# Patient Record
Sex: Female | Born: 1945
Health system: Southern US, Community
[De-identification: ages and names within clinical notes are randomized; demographics above are authoritative.]

## PROBLEM LIST (undated history)

## (undated) DIAGNOSIS — IMO0001 Reserved for inherently not codable concepts without codable children: Secondary | ICD-10-CM

## (undated) DIAGNOSIS — G5600 Carpal tunnel syndrome, unspecified upper limb: Secondary | ICD-10-CM

## (undated) DIAGNOSIS — M1990 Unspecified osteoarthritis, unspecified site: Secondary | ICD-10-CM

## (undated) DIAGNOSIS — I10 Essential (primary) hypertension: Secondary | ICD-10-CM

## (undated) DIAGNOSIS — Z5189 Encounter for other specified aftercare: Secondary | ICD-10-CM

## (undated) DIAGNOSIS — D649 Anemia, unspecified: Secondary | ICD-10-CM

## (undated) HISTORY — PX: KNEE ARTHROPLASTY: SHX992

## (undated) HISTORY — PX: APPENDECTOMY: SHX54

## (undated) HISTORY — PX: ABDOMINAL HYSTERECTOMY: SHX81

---

## 2004-01-25 ENCOUNTER — Emergency Department: Payer: Self-pay | Admitting: Internal Medicine

## 2004-04-30 ENCOUNTER — Emergency Department: Payer: Self-pay | Admitting: Emergency Medicine

## 2004-06-23 ENCOUNTER — Emergency Department: Payer: Self-pay | Admitting: Internal Medicine

## 2004-06-24 ENCOUNTER — Ambulatory Visit: Payer: Self-pay | Admitting: Internal Medicine

## 2004-07-07 ENCOUNTER — Ambulatory Visit: Payer: Self-pay | Admitting: Internal Medicine

## 2004-08-30 ENCOUNTER — Ambulatory Visit: Payer: Self-pay | Admitting: Internal Medicine

## 2004-12-01 ENCOUNTER — Emergency Department: Payer: Self-pay | Admitting: Emergency Medicine

## 2005-02-02 ENCOUNTER — Emergency Department: Payer: Self-pay | Admitting: Emergency Medicine

## 2005-03-25 ENCOUNTER — Other Ambulatory Visit: Payer: Self-pay

## 2005-03-25 ENCOUNTER — Inpatient Hospital Stay: Payer: Self-pay | Admitting: Internal Medicine

## 2005-04-01 ENCOUNTER — Ambulatory Visit: Payer: Self-pay | Admitting: Internal Medicine

## 2005-06-23 ENCOUNTER — Inpatient Hospital Stay: Payer: Self-pay | Admitting: Specialist

## 2007-06-06 ENCOUNTER — Emergency Department: Payer: Self-pay | Admitting: Emergency Medicine

## 2009-07-25 ENCOUNTER — Emergency Department: Payer: Self-pay | Admitting: Emergency Medicine

## 2010-08-25 ENCOUNTER — Emergency Department: Payer: Self-pay | Admitting: Emergency Medicine

## 2011-04-17 ENCOUNTER — Encounter (HOSPITAL_COMMUNITY): Payer: Self-pay | Admitting: Respiratory Therapy

## 2011-04-22 ENCOUNTER — Encounter (HOSPITAL_COMMUNITY)
Admission: RE | Admit: 2011-04-22 | Discharge: 2011-04-22 | Disposition: A | Discharge status: Home / Self Care | Payer: Medicare Other | Source: Ambulatory Visit | Attending: Neurosurgery | Admitting: Neurosurgery

## 2011-04-22 ENCOUNTER — Encounter (HOSPITAL_COMMUNITY)
Admission: RE | Admit: 2011-04-22 | Discharge: 2011-04-22 | Disposition: A | Discharge status: Home / Self Care | Payer: Medicare Other | Source: Ambulatory Visit | Attending: Anesthesiology | Admitting: Anesthesiology

## 2011-04-22 ENCOUNTER — Other Ambulatory Visit: Payer: Self-pay

## 2011-04-22 ENCOUNTER — Encounter (HOSPITAL_COMMUNITY): Payer: Self-pay

## 2011-04-22 HISTORY — DX: Encounter for other specified aftercare: Z51.89

## 2011-04-22 HISTORY — DX: Essential (primary) hypertension: I10

## 2011-04-22 HISTORY — DX: Anemia, unspecified: D64.9

## 2011-04-22 HISTORY — DX: Carpal tunnel syndrome, unspecified upper limb: G56.00

## 2011-04-22 HISTORY — DX: Unspecified osteoarthritis, unspecified site: M19.90

## 2011-04-22 HISTORY — DX: Reserved for inherently not codable concepts without codable children: IMO0001

## 2011-04-22 LAB — BASIC METABOLIC PANEL
Calcium: 9.4 mg/dL (ref 8.4–10.5)
Creatinine, Ser: 0.79 mg/dL (ref 0.50–1.10)
GFR calc Af Amer: >90 mL/min (ref >90)

## 2011-04-22 LAB — SURGICAL PCR SCREEN
MRSA, PCR: NEGATIVE
Staphylococcus aureus: POSITIVE — AB

## 2011-04-22 LAB — CBC
Platelets: 228 10*3/uL (ref 150–400)
RDW: 13 % (ref 11.5–15.5)
WBC: 8.2 10*3/uL (ref 4.0–10.5)

## 2011-04-22 NOTE — Pre-Procedure Instructions (Signed)
[address] [name]  04/22/2011   Your procedure is scheduled on:  Monday April 27, 2011  Report to [facility] at 1:00PM.  Call this number if you have problems the morning of surgery: [phone]   Remember:   Do not eat food:After Midnight.  May have clear liquids: up to 4 Hours before arrival. (up to 9:00am)  Clear liquids include soda, tea, black coffee, apple or grape juice, broth.  Take these medicines the morning of surgery with A SIP OF WATER: flexeril, metoprolol, prednisone   Do not wear jewelry, make-up or nail polish.  Do not wear lotions, powders, or perfumes. You may wear deodorant.  Do not shave 48 hours prior to surgery.  Do not bring valuables to the hospital.  Contacts, dentures or bridgework may not be worn into surgery.  Leave suitcase in the car. After surgery it may be brought to your room.  For patients admitted to the hospital, checkout time is 11:00 AM the day of discharge.   Patients discharged the day of surgery will not be allowed to drive home.  Name and phone number of your driver: Donta Mcinroy [phone]  Special Instructions: CHG Shower Use Special Wash: 1/2 bottle night before surgery and 1/2 bottle morning of surgery.   Please read over the following fact sheets that you were given: Pain Booklet, Coughing and Deep Breathing, MRSA Information and Surgical Site Infection Prevention

## 2011-04-22 NOTE — Progress Notes (Signed)
Forwarded to [facility] for Elevated blood sugar and  EKG eval.

## 2011-04-23 NOTE — Consult Note (Signed)
Anesthesia:  Patient is a 66 year old female scheduled for a C4-5 ACDF on 04/27/11.  History includes obesity, HTN, DM2, anemia with history of transfusion, non-smoker, arthritis, carpal tunnel syndrome.    Labs noted.  She will be a CBG on arrival.   CXR shows no acute cardiopulmonary process.  EKG shows NSR, LAD, moderate voltage criteria for LVH (may be normal variant).  No CV symptoms reported at PAT.  If she remains asymptomatic then anticipate she can proceed.

## 2011-04-26 MED ORDER — VANCOMYCIN HCL 500 MG IV SOLR
500.0000 mg | Freq: Once | INTRAVENOUS | Status: AC
  Administered 2011-04-27: 500 mg via INTRAVENOUS
  Filled 2011-04-26: qty 500

## 2011-04-27 ENCOUNTER — Encounter (HOSPITAL_COMMUNITY): Payer: Self-pay | Admitting: Vascular Surgery

## 2011-04-27 ENCOUNTER — Inpatient Hospital Stay (HOSPITAL_COMMUNITY): Payer: Medicare Other | Admitting: Vascular Surgery

## 2011-04-27 ENCOUNTER — Inpatient Hospital Stay (HOSPITAL_COMMUNITY)
Admission: RE | Admit: 2011-04-27 | Discharge: 2011-04-28 | DRG: 472 | Disposition: A | Discharge status: Home / Self Care | Payer: Medicare Other | Source: Ambulatory Visit | Attending: Neurosurgery | Admitting: Neurosurgery

## 2011-04-27 ENCOUNTER — Encounter (HOSPITAL_COMMUNITY): Payer: Self-pay | Admitting: Surgery

## 2011-04-27 ENCOUNTER — Encounter (HOSPITAL_COMMUNITY)
Admission: RE | Disposition: A | Discharge status: Home / Self Care | Payer: Self-pay | Source: Ambulatory Visit | Attending: Neurosurgery

## 2011-04-27 ENCOUNTER — Inpatient Hospital Stay (HOSPITAL_COMMUNITY): Payer: Medicare Other

## 2011-04-27 DIAGNOSIS — M4712 Other spondylosis with myelopathy, cervical region: Secondary | ICD-10-CM | POA: Diagnosis present

## 2011-04-27 DIAGNOSIS — E119 Type 2 diabetes mellitus without complications: Secondary | ICD-10-CM | POA: Diagnosis present

## 2011-04-27 DIAGNOSIS — Z01812 Encounter for preprocedural laboratory examination: Secondary | ICD-10-CM

## 2011-04-27 DIAGNOSIS — M4310 Spondylolisthesis, site unspecified: Secondary | ICD-10-CM | POA: Diagnosis present

## 2011-04-27 DIAGNOSIS — G959 Disease of spinal cord, unspecified: Secondary | ICD-10-CM | POA: Diagnosis present

## 2011-04-27 DIAGNOSIS — M5000 Cervical disc disorder with myelopathy, unspecified cervical region: Principal | ICD-10-CM | POA: Diagnosis present

## 2011-04-27 HISTORY — PX: ANTERIOR CERVICAL DECOMP/DISCECTOMY FUSION: SHX1161

## 2011-04-27 LAB — GLUCOSE, CAPILLARY
Glucose-Capillary: 206 mg/dL — ABNORMAL HIGH (ref 70–99)
Glucose-Capillary: 261 mg/dL — ABNORMAL HIGH (ref 70–99)
Glucose-Capillary: 285 mg/dL — ABNORMAL HIGH (ref 70–99)

## 2011-04-27 SURGERY — ANTERIOR CERVICAL DECOMPRESSION/DISCECTOMY FUSION 2 LEVELS
Anesthesia: General | Wound class: Clean

## 2011-04-27 MED ORDER — GLYCOPYRROLATE 0.2 MG/ML IJ SOLN
INTRAMUSCULAR | Status: DC | PRN
  Administered 2011-04-27: .8 mg via INTRAVENOUS

## 2011-04-27 MED ORDER — PHENOL 1.4 % MT LIQD
1.0000 | OROMUCOSAL | Status: DC | PRN
  Administered 2011-04-27: 1 via OROMUCOSAL
  Filled 2011-04-27: qty 177

## 2011-04-27 MED ORDER — CYCLOBENZAPRINE HCL 10 MG PO TABS: 10.0000 mg | ORAL_TABLET | Freq: Three times a day (TID) | ORAL | Status: DC | PRN

## 2011-04-27 MED ORDER — CELECOXIB 200 MG PO CAPS
200.0000 mg | ORAL_CAPSULE | Freq: Every day | ORAL | Status: DC
  Administered 2011-04-27 – 2011-04-28 (×2): 200 mg via ORAL
  Filled 2011-04-27 (×2): qty 1

## 2011-04-27 MED ORDER — ACETAMINOPHEN 325 MG PO TABS: 650.0000 mg | ORAL_TABLET | ORAL | Status: DC | PRN

## 2011-04-27 MED ORDER — HYDROMORPHONE HCL PF 1 MG/ML IJ SOLN
0.2500 mg | INTRAMUSCULAR | Status: DC | PRN
  Administered 2011-04-27 (×2): 0.5 mg via INTRAVENOUS

## 2011-04-27 MED ORDER — NEOSTIGMINE METHYLSULFATE 1 MG/ML IJ SOLN
INTRAMUSCULAR | Status: DC | PRN
  Administered 2011-04-27: 5 mg via INTRAVENOUS

## 2011-04-27 MED ORDER — HYDROCHLOROTHIAZIDE 25 MG PO TABS
25.0000 mg | ORAL_TABLET | Freq: Every day | ORAL | Status: DC
  Administered 2011-04-27 – 2011-04-28 (×2): 25 mg via ORAL
  Filled 2011-04-27 (×2): qty 1

## 2011-04-27 MED ORDER — INSULIN ASPART 100 UNIT/ML ~~LOC~~ SOLN
0.0000 [IU] | Freq: Every day | SUBCUTANEOUS | Status: DC
  Administered 2011-04-27: 3 [IU] via SUBCUTANEOUS

## 2011-04-27 MED ORDER — 0.9 % SODIUM CHLORIDE (POUR BTL) OPTIME
TOPICAL | Status: DC | PRN
  Administered 2011-04-27: 1000 mL

## 2011-04-27 MED ORDER — ONDANSETRON HCL 4 MG/2ML IJ SOLN: 4.0000 mg | INTRAMUSCULAR | Status: DC | PRN

## 2011-04-27 MED ORDER — CEFAZOLIN SODIUM 1-5 GM-% IV SOLN: 1.0000 g | Freq: Three times a day (TID) | INTRAVENOUS | Status: DC

## 2011-04-27 MED ORDER — THROMBIN 5000 UNITS EX SOLR
CUTANEOUS | Status: DC | PRN
  Administered 2011-04-27: 5000 [IU] via TOPICAL

## 2011-04-27 MED ORDER — NAPROXEN 375 MG PO TABS
375.0000 mg | ORAL_TABLET | Freq: Three times a day (TID) | ORAL | Status: DC
  Administered 2011-04-27 – 2011-04-28 (×2): 375 mg via ORAL
  Filled 2011-04-27 (×5): qty 1

## 2011-04-27 MED ORDER — INSULIN ASPART 100 UNIT/ML ~~LOC~~ SOLN
0.0000 [IU] | Freq: Three times a day (TID) | SUBCUTANEOUS | Status: DC
  Administered 2011-04-27: 11 [IU] via SUBCUTANEOUS
  Administered 2011-04-28: 7 [IU] via SUBCUTANEOUS

## 2011-04-27 MED ORDER — ROCURONIUM BROMIDE 100 MG/10ML IV SOLN
INTRAVENOUS | Status: DC | PRN
  Administered 2011-04-27: 20 mg via INTRAVENOUS
  Administered 2011-04-27: 10 mg via INTRAVENOUS
  Administered 2011-04-27: 50 mg via INTRAVENOUS

## 2011-04-27 MED ORDER — VANCOMYCIN HCL IN DEXTROSE 1-5 GM/200ML-% IV SOLN
1000.0000 mg | Freq: Once | INTRAVENOUS | Status: AC
  Administered 2011-04-28: 1000 mg via INTRAVENOUS
  Filled 2011-04-27: qty 200

## 2011-04-27 MED ORDER — CLONIDINE HCL 0.1 MG PO TABS
0.1000 mg | ORAL_TABLET | Freq: Three times a day (TID) | ORAL | Status: DC
  Administered 2011-04-27 – 2011-04-28 (×3): 0.1 mg via ORAL
  Filled 2011-04-27 (×5): qty 1

## 2011-04-27 MED ORDER — LABETALOL HCL 5 MG/ML IV SOLN
INTRAVENOUS | Status: AC
  Administered 2011-04-27: 5 mg
  Filled 2011-04-27: qty 4

## 2011-04-27 MED ORDER — PANTOPRAZOLE SODIUM 40 MG PO TBEC
40.0000 mg | DELAYED_RELEASE_TABLET | Freq: Every day | ORAL | Status: DC
  Administered 2011-04-27: 40 mg via ORAL
  Filled 2011-04-27: qty 1

## 2011-04-27 MED ORDER — LISINOPRIL 20 MG PO TABS
20.0000 mg | ORAL_TABLET | Freq: Every day | ORAL | Status: DC
  Administered 2011-04-27 – 2011-04-28 (×2): 20 mg via ORAL
  Filled 2011-04-27 (×2): qty 1

## 2011-04-27 MED ORDER — FENTANYL CITRATE 0.05 MG/ML IJ SOLN
INTRAMUSCULAR | Status: DC | PRN
  Administered 2011-04-27: 50 ug via INTRAVENOUS
  Administered 2011-04-27: 100 ug via INTRAVENOUS
  Administered 2011-04-27 (×2): 50 ug via INTRAVENOUS
  Administered 2011-04-27: 100 ug via INTRAVENOUS

## 2011-04-27 MED ORDER — SODIUM CHLORIDE 0.9 % IR SOLN
Status: DC | PRN
  Administered 2011-04-27: 13:00:00

## 2011-04-27 MED ORDER — BACITRACIN 50000 UNITS IM SOLR
INTRAMUSCULAR | Status: AC
Start: 2011-04-27 — End: 2011-04-28
  Filled 2011-04-27: qty 1

## 2011-04-27 MED ORDER — HYDROMORPHONE HCL PF 1 MG/ML IJ SOLN: 0.5000 mg | INTRAMUSCULAR | Status: DC | PRN

## 2011-04-27 MED ORDER — METOPROLOL SUCCINATE ER 25 MG PO TB24
25.0000 mg | ORAL_TABLET | Freq: Every day | ORAL | Status: DC
  Administered 2011-04-28: 25 mg via ORAL
  Filled 2011-04-27: qty 1

## 2011-04-27 MED ORDER — LINAGLIPTIN 5 MG PO TABS
5.0000 mg | ORAL_TABLET | Freq: Every day | ORAL | Status: DC
  Administered 2011-04-28: 5 mg via ORAL
  Filled 2011-04-27 (×2): qty 1

## 2011-04-27 MED ORDER — OXYCODONE-ACETAMINOPHEN 5-325 MG PO TABS
1.0000 | ORAL_TABLET | ORAL | Status: DC | PRN
  Administered 2011-04-27: 1 via ORAL
  Filled 2011-04-27: qty 1

## 2011-04-27 MED ORDER — HYDROMORPHONE HCL PF 1 MG/ML IJ SOLN
INTRAMUSCULAR | Status: AC
  Filled 2011-04-27: qty 1

## 2011-04-27 MED ORDER — HEMOSTATIC AGENTS (NO CHARGE) OPTIME
TOPICAL | Status: DC | PRN
  Administered 2011-04-27: 1 via TOPICAL

## 2011-04-27 MED ORDER — ACETAMINOPHEN 650 MG RE SUPP: 650.0000 mg | RECTAL | Status: DC | PRN

## 2011-04-27 MED ORDER — LACTATED RINGERS IV SOLN
INTRAVENOUS | Status: DC | PRN
  Administered 2011-04-27 (×2): via INTRAVENOUS

## 2011-04-27 MED ORDER — LABETALOL HCL 5 MG/ML IV SOLN
5.0000 mg | INTRAVENOUS | Status: DC | PRN
  Administered 2011-04-27 (×2): 5 mg via INTRAVENOUS

## 2011-04-27 MED ORDER — ONDANSETRON HCL 4 MG/2ML IJ SOLN: 4.0000 mg | Freq: Once | INTRAMUSCULAR | Status: DC | PRN

## 2011-04-27 MED ORDER — SODIUM CHLORIDE 0.9 % IJ SOLN: 3.0000 mL | Freq: Two times a day (BID) | INTRAMUSCULAR | Status: DC

## 2011-04-27 MED ORDER — MENTHOL 3 MG MT LOZG: 1.0000 | LOZENGE | OROMUCOSAL | Status: DC | PRN

## 2011-04-27 MED ORDER — THROMBIN 5000 UNITS EX SOLR
OROMUCOSAL | Status: DC | PRN
  Administered 2011-04-27: 14:00:00 via TOPICAL

## 2011-04-27 MED ORDER — PANTOPRAZOLE SODIUM 40 MG IV SOLR
40.0000 mg | Freq: Every day | INTRAVENOUS | Status: DC
  Filled 2011-04-27: qty 40

## 2011-04-27 MED ORDER — LIDOCAINE HCL 4 % MT SOLN
OROMUCOSAL | Status: DC | PRN
  Administered 2011-04-27: 4 mL via TOPICAL

## 2011-04-27 MED ORDER — LABETALOL HCL 5 MG/ML IV SOLN
10.0000 mg | INTRAVENOUS | Status: DC | PRN
  Administered 2011-04-27: 10 mg via INTRAVENOUS
  Filled 2011-04-27: qty 4

## 2011-04-27 MED ORDER — PREDNISONE 10 MG PO TABS: 10.0000 mg | ORAL_TABLET | Freq: Every day | ORAL | Status: DC

## 2011-04-27 MED ORDER — DEXAMETHASONE SODIUM PHOSPHATE 10 MG/ML IJ SOLN
INTRAMUSCULAR | Status: DC | PRN
  Administered 2011-04-27: 8 mg via INTRAVENOUS

## 2011-04-27 MED ORDER — PROPOFOL 10 MG/ML IV EMUL
INTRAVENOUS | Status: DC | PRN
  Administered 2011-04-27: 200 mg via INTRAVENOUS

## 2011-04-27 MED ORDER — SODIUM CHLORIDE 0.9 % IV SOLN
INTRAVENOUS | Status: AC
  Filled 2011-04-27: qty 500

## 2011-04-27 MED ORDER — LIDOCAINE HCL (CARDIAC) 20 MG/ML IV SOLN
INTRAVENOUS | Status: DC | PRN
  Administered 2011-04-27: 40 mg via INTRAVENOUS

## 2011-04-27 MED ORDER — ONDANSETRON HCL 4 MG/2ML IJ SOLN
INTRAMUSCULAR | Status: DC | PRN
  Administered 2011-04-27: 4 mg via INTRAVENOUS

## 2011-04-27 MED ORDER — METFORMIN HCL 500 MG PO TABS
1000.0000 mg | ORAL_TABLET | Freq: Two times a day (BID) | ORAL | Status: DC
  Administered 2011-04-27 – 2011-04-28 (×2): 1000 mg via ORAL
  Filled 2011-04-27 (×4): qty 2

## 2011-04-27 MED ORDER — GLIPIZIDE ER 5 MG PO TB24
5.0000 mg | ORAL_TABLET | Freq: Two times a day (BID) | ORAL | Status: DC
  Administered 2011-04-27 – 2011-04-28 (×2): 5 mg via ORAL
  Filled 2011-04-27 (×4): qty 1

## 2011-04-27 MED ORDER — LISINOPRIL-HYDROCHLOROTHIAZIDE 20-25 MG PO TABS: 1.0000 | ORAL_TABLET | Freq: Every day | ORAL | Status: DC

## 2011-04-27 SURGICAL SUPPLY — 64 items
BAG DECANTER FOR FLEXI CONT (MISCELLANEOUS) ×2 IMPLANT
BENZOIN TINCTURE PRP APPL 2/3 (GAUZE/BANDAGES/DRESSINGS) ×2 IMPLANT
BIT DRILL SPINE QC 12 (BIT) ×2 IMPLANT
BRUSH SCRUB EZ PLAIN DRY (MISCELLANEOUS) ×2 IMPLANT
BUR MATCHSTICK NEURO 3.0 LAGG (BURR) ×2 IMPLANT
CANISTER SUCTION 2500CC (MISCELLANEOUS) ×2 IMPLANT
CLOTH BEACON ORANGE TIMEOUT ST (SAFETY) ×2 IMPLANT
CONT SPEC 4OZ CLIKSEAL STRL BL (MISCELLANEOUS) ×2 IMPLANT
CORDS BIPOLAR (ELECTRODE) ×2 IMPLANT
DERMABOND ADVANCED (GAUZE/BANDAGES/DRESSINGS) ×1
DERMABOND ADVANCED .7 DNX12 (GAUZE/BANDAGES/DRESSINGS) ×1 IMPLANT
DRAIN SNY WOU 7FLT (WOUND CARE) ×2 IMPLANT
DRAPE C-ARM 42X72 X-RAY (DRAPES) ×4 IMPLANT
DRAPE LAPAROTOMY 100X72 PEDS (DRAPES) ×2 IMPLANT
DRAPE MICROSCOPE ZEISS OPMI (DRAPES) ×2 IMPLANT
DRAPE POUCH INSTRU U-SHP 10X18 (DRAPES) ×2 IMPLANT
DRSG OPSITE 4X5.5 SM (GAUZE/BANDAGES/DRESSINGS) ×2 IMPLANT
ELECT COATED BLADE 2.86 ST (ELECTRODE) ×2 IMPLANT
ELECT REM PT RETURN 9FT ADLT (ELECTROSURGICAL) ×2
ELECTRODE REM PT RTRN 9FT ADLT (ELECTROSURGICAL) ×1 IMPLANT
EVACUATOR SILICONE 100CC (DRAIN) ×2 IMPLANT
GAUZE SPONGE 4X4 16PLY XRAY LF (GAUZE/BANDAGES/DRESSINGS) ×2 IMPLANT
GLOVE BIO SURGEON STRL SZ8 (GLOVE) ×2 IMPLANT
GLOVE BIOGEL PI IND STRL 7.0 (GLOVE) ×1 IMPLANT
GLOVE BIOGEL PI IND STRL 7.5 (GLOVE) ×2 IMPLANT
GLOVE BIOGEL PI INDICATOR 7.0 (GLOVE) ×1
GLOVE BIOGEL PI INDICATOR 7.5 (GLOVE) ×2
GLOVE ECLIPSE 7.5 STRL STRAW (GLOVE) ×8 IMPLANT
GLOVE EXAM NITRILE LRG STRL (GLOVE) IMPLANT
GLOVE EXAM NITRILE MD LF STRL (GLOVE) ×2 IMPLANT
GLOVE EXAM NITRILE XL STR (GLOVE) IMPLANT
GLOVE EXAM NITRILE XS STR PU (GLOVE) IMPLANT
GLOVE INDICATOR 8.5 STRL (GLOVE) ×2 IMPLANT
GLOVE SS BIOGEL STRL SZ 6.5 (GLOVE) ×2 IMPLANT
GLOVE SUPERSENSE BIOGEL SZ 6.5 (GLOVE) ×2
GOWN BRE IMP SLV AUR LG STRL (GOWN DISPOSABLE) IMPLANT
GOWN BRE IMP SLV AUR XL STRL (GOWN DISPOSABLE) ×6 IMPLANT
GOWN STRL REIN 2XL LVL4 (GOWN DISPOSABLE) ×2 IMPLANT
HEAD HALTER (SOFTGOODS) ×2 IMPLANT
HEMOSTAT POWDER KIT SURGIFOAM (HEMOSTASIS) ×2 IMPLANT
HEMOSTAT POWDER SURGIFOAM 1G (HEMOSTASIS) ×2 IMPLANT
KIT BASIN OR (CUSTOM PROCEDURE TRAY) ×2 IMPLANT
KIT ROOM TURNOVER OR (KITS) ×2 IMPLANT
NEEDLE SPNL 20GX3.5 QUINCKE YW (NEEDLE) ×2 IMPLANT
NS IRRIG 1000ML POUR BTL (IV SOLUTION) ×2 IMPLANT
PACK LAMINECTOMY NEURO (CUSTOM PROCEDURE TRAY) ×2 IMPLANT
PAD ARMBOARD 7.5X6 YLW CONV (MISCELLANEOUS) ×6 IMPLANT
PLATE ANT CERV XTEND 2 LV 32 (Plate) ×2 IMPLANT
PUTTY BONE DBX 2.5 MIS (Bone Implant) ×2 IMPLANT
RUBBERBAND STERILE (MISCELLANEOUS) ×4 IMPLANT
SCREW XTD VAR 4.2 SELF TAP 12 (Screw) ×12 IMPLANT
SPACER COLONIAL LGE 8MM 7DEG (Spacer) ×4 IMPLANT
SPONGE GAUZE 4X4 12PLY (GAUZE/BANDAGES/DRESSINGS) IMPLANT
SPONGE INTESTINAL PEANUT (DISPOSABLE) ×2 IMPLANT
SPONGE SURGIFOAM ABS GEL SZ50 (HEMOSTASIS) ×4 IMPLANT
STRIP CLOSURE SKIN 1/2X4 (GAUZE/BANDAGES/DRESSINGS) IMPLANT
SUT VIC AB 3-0 SH 8-18 (SUTURE) ×2 IMPLANT
SUT VICRYL 4-0 PS2 18IN ABS (SUTURE) ×4 IMPLANT
SYR 20ML ECCENTRIC (SYRINGE) ×2 IMPLANT
TAPE CLOTH 4X10 WHT NS (GAUZE/BANDAGES/DRESSINGS) ×2 IMPLANT
TOWEL OR 17X24 6PK STRL BLUE (TOWEL DISPOSABLE) ×2 IMPLANT
TOWEL OR 17X26 10 PK STRL BLUE (TOWEL DISPOSABLE) ×2 IMPLANT
TRAP SPECIMEN MUCOUS 40CC (MISCELLANEOUS) ×2 IMPLANT
WATER STERILE IRR 1000ML POUR (IV SOLUTION) ×2 IMPLANT

## 2011-04-27 NOTE — Transfer of Care (Signed)
Immediate Anesthesia Transfer of Care Note  Patient: [name]  Procedure(s) Performed: Procedure(s) (LRB): ANTERIOR CERVICAL DECOMPRESSION/DISCECTOMY FUSION 2 LEVELS (N/A)  Patient Location: PACU  Anesthesia Type: General  Level of Consciousness: awake, alert  and oriented  Airway & Oxygen Therapy: Patient Spontanous Breathing and Patient connected to nasal cannula oxygen  Post-op Assessment: Report given to PACU RN and Post -op Vital signs reviewed and stable  Post vital signs: Reviewed and stable  Complications: No apparent anesthesia complications

## 2011-04-27 NOTE — Anesthesia Procedure Notes (Signed)
Procedure Name: Intubation Date/Time: 04/27/2011 12:27 PM Performed by: Sharlene Dory E Pre-anesthesia Checklist: Patient identified, Emergency Drugs available, Suction available, Patient being monitored and Timeout performed Patient Re-evaluated:Patient Re-evaluated prior to inductionOxygen Delivery Method: Circle system utilized Preoxygenation: Pre-oxygenation with 100% oxygen Intubation Type: IV induction Ventilation: Mask ventilation without difficulty Laryngoscope Size: Miller and 2 Grade View: Grade II Tube type: Oral Tube size: 7.5 mm Number of attempts: 1 Airway Equipment and Method: Stylet Placement Confirmation: ETT inserted through vocal cords under direct vision,  positive ETCO2 and breath sounds checked- equal and bilateral Secured at: 21 cm Tube secured with: Tape Dental Injury: Teeth and Oropharynx as per pre-operative assessment  Comments: Intubation per Georgiann Mohs, SRNA

## 2011-04-27 NOTE — Op Note (Signed)
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  EBL: Minimal  History of present illness: Patient 65 year 3 months of progressive worsening neck pain bilateral arm pain with numbness tingling weakness in her hands. 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 Risks and benefits of the procedure, perioperative course, expectations of outcome, and alternatives surgery were all explained.  Procedure: Patient was brought in the or was induced under general anesthesia and positioned supine the neck in slight extension in 5 pounds of halter traction. The right-sided neck was prepped and draped in routine sterile fashion preoperative x-ray localize the appropriate level so a curvilinear incision was made just off the midline to the anterior border of the sternomastoid and superficial layer of the platysma was dissected out and divided longitudinally. The avascular 10-20 sternomastoid and and the strap muscles was developed down to the prevertebral fascia. Interoperative x-ray identified the appropriate level the longus close and  reflected laterally and self-retaining retractors placed. There is a large anterior aspect of the C3-4 disc space was opened with a Leksell rongeur. Then an annulotomy some a little 15 blade scalpel anterior ossified to remove the tooth and a Kerrison punch in both the space were drilled down the posterior annulus nausea complex first working at C3-4 this disc space was further drilled down the microscopic illumination aggressive undergoing of both endplates of identification PLL there was a subligamentous disc herniation and partially calcified this was teased off of the dura with a blunt nerve hook alleviate a lot of central stenosis or cord compression. Removal of this and aggressive undergoing of both endplates with removal of some osteophyte coming off the C3 and C4 vertebral body also improved the decompression the central canal. Carried out laterally to C4 pedicles were identified and C4 neuroforamina were decompressed. This was then packed with Gelfoam and attention was taken the C4-5 similar fashion C4-5 was drilled down again down the posterior annulus nausea complex aggressive and viable template help decompress the central canal the PLL was identified and removed in piecemeal fashion. March across laterally posted 5 pedicles identified. There was a decompressed flush with pedicle. Then both endplates of both especially with a prescription be a curet copiously irrigated meticulous hemostasis was maintained. 28 mm peek cages were packed with local autograft mixed with DBX and then the globus extend plating system was placed all screws excellent purchase locking mechanisms were engaged in the was inspected and copiously irrigated after meticulous in a stasis was maintained the carotid and soft his air were inspected For no injury wounds and closed after placement of a 7 mm a 7 mm JP drain and the platysmas reapproximated with interrupted Vicryl and skin was closed running 4 subcuticular  benzoin Steri-Strips  were applied patient recovered in stable condition at the end of case all needle counts and sponge counts were correct.

## 2011-04-27 NOTE — Anesthesia Postprocedure Evaluation (Signed)
  Anesthesia Post-op Note  Patient: [name]  Procedure(s) Performed: Procedure(s) (LRB): ANTERIOR CERVICAL DECOMPRESSION/DISCECTOMY FUSION 2 LEVELS (N/A)  Patient Location: PACU  Anesthesia Type: General  Level of Consciousness: awake, alert , oriented and patient cooperative  Airway and Oxygen Therapy: Patient Spontanous Breathing and Patient connected to nasal cannula oxygen  Post-op Pain: mild  Post-op Assessment: Post-op Vital signs reviewed, Patient's Cardiovascular Status Stable, Respiratory Function Stable, Patent Airway, No signs of Nausea or vomiting and Pain level controlled  Post-op Vital Signs: stable  Complications: No apparent anesthesia complications

## 2011-04-27 NOTE — Anesthesia Preprocedure Evaluation (Signed)
Anesthesia Evaluation  Patient identified by MRN, date of birth, ID band Patient awake    Reviewed: Allergy & Precautions, H&P , NPO status   Airway Mallampati: II TM Distance: >3 FB Neck ROM: full    Dental  (+) Teeth Intact   Pulmonary          Cardiovascular hypertension, Rhythm:regular Rate:Normal     Neuro/Psych  Neuromuscular disease    GI/Hepatic   Endo/Other  Diabetes mellitus-, Type 2, Oral Hypoglycemic AgentsMorbid obesity  Renal/GU      Musculoskeletal   Abdominal   Peds  Hematology   Anesthesia Other Findings   Reproductive/Obstetrics                           Anesthesia Physical Anesthesia Plan  ASA: III  Anesthesia Plan: General ETT   Post-op Pain Management:    Induction: Intravenous  Airway Management Planned: Oral ETT  Additional Equipment:   Intra-op Plan:   Post-operative Plan: Extubation in OR  Informed Consent: I have reviewed the patients History and Physical, chart, labs and discussed the procedure including the risks, benefits and alternatives for the proposed anesthesia with the patient or authorized representative who has indicated his/her understanding and acceptance.     Plan Discussed with: Anesthesiologist, CRNA and Surgeon  Anesthesia Plan Comments:         Anesthesia Quick Evaluation

## 2011-04-27 NOTE — Preoperative (Signed)
Beta Blockers   Reason not to administer Beta Blockers:Not Applicable 

## 2011-04-27 NOTE — H&P (Signed)
[name] is an 66 y.o. female.   Chief Complaint: Neck and bilateral arm pain and tingling her in HPI: Patient positive for shortness of breath worsening neck pain with numbness tingling weakness in her hands that she's noticed is been getting progressive worse over last several weeks and on each underwent imaging workup which showed significant disc herniation C3-4 with severe spinal cord compression and signal change within the cord at levels will spondylolisthesis C4-5 with bifrontal stenosis that level as well and due to patient's failed conservative treatment imaging findings progression of clinical syndrome and clinical exam patient was recommended anterior cervical discectomy fusion with a varus benefits of the operation with her she understands and agreed to proceed forward.  Past Medical History  Diagnosis Date  . Hypertension   . Diabetes mellitus     oral medications  . Anemia     as young child  . Blood transfusion     1964  . Carpal tunnel syndrome   . Arthritis     back, leg and foot    Past Surgical History  Procedure Date  . Abdominal hysterectomy   . Knee arthroplasty     right  . Appendectomy     History reviewed. No pertinent family history. Social History:  reports that she has never smoked. She does not have any smokeless tobacco history on file. She reports that she does not drink alcohol or use illicit drugs.  Allergies:  Allergies  Allergen Reactions  . Penicillins Shortness Of Breath and Swelling  . Codeine Other (See Comments)    Ringing in ear    Medications Prior to Admission  Medication Dose Route Frequency Provider Last Rate Last Dose  . vancomycin (VANCOCIN) 500 mg in sodium chloride 0.9 % 100 mL IVPB  500 mg Intravenous Once Mariam Dollar, MD       No current outpatient prescriptions on file as of 04/27/2011.    Results for orders placed during the hospital encounter of 04/27/11 (from the past 48 hour(s))  GLUCOSE, CAPILLARY     Status:  Abnormal   Collection Time   04/27/11 11:35 AM      Component Value Range Comment   Glucose-Capillary 151 (*) 70 - 99 (mg/dL)    No results found.  Review of Systems  Constitutional: Negative.   HENT: Positive for neck pain.   Eyes: Negative.   Respiratory: Negative.   Cardiovascular: Negative.   Gastrointestinal: Negative.   Skin: Negative.   Neurological: Positive for tingling.    Blood pressure 150/75, pulse 83, temperature 97.5 F (36.4 C), temperature source Oral, resp. rate 18, SpO2 97.00%. Physical Exam  Constitutional: She is oriented to person, place, and time. She appears well-developed and well-nourished.  HENT:  Head: Normocephalic.  Eyes: Pupils are equal, round, and reactive to light.  Neck: Normal range of motion.  Cardiovascular: Normal rate.   Respiratory: Effort normal.  GI: Soft.  Neurological: She is alert and oriented to person, place, and time. She has normal strength. GCS eye subscore is 4. GCS verbal subscore is 5. GCS motor subscore is 6.  Reflex Scores:      Brachioradialis reflexes are 2+ on the right side and 2+ on the left side.      Patellar reflexes are 2+ on the right side and 2+ on the left side.      Achilles reflexes are 2+ on the right side and 2+ on the left side.      Strength is  5 out of 5 in her upper and lower 70s bilaterally reflexes are brisk neck is slight limitation of range of motion but overall not bad     Assessment/Plan 66 year old female presents with cervical spine with myelopathy and severe cervical stenosis from disc herniation C3-4 as well as severe spondylosis C4-5 patient was recommended anterior cervical discectomies and fusion at these 2 levels the risks benefits of the operation perioperative course and expectations of outcome alternatives of surgery she understands and agrees to proceed forward.  Wrangler Penning P 04/27/2011, 11:53 AM

## 2011-04-28 LAB — GLUCOSE, CAPILLARY: Glucose-Capillary: 215 mg/dL — ABNORMAL HIGH (ref 70–99)

## 2011-04-28 MED ORDER — OXYCODONE-ACETAMINOPHEN 5-325 MG PO TABS: 1.0000 | ORAL_TABLET | ORAL | Status: AC | PRN

## 2011-04-28 MED ORDER — CLONIDINE HCL 0.1 MG PO TABS: 0.1000 mg | ORAL_TABLET | Freq: Three times a day (TID) | ORAL | Status: AC

## 2011-04-28 MED ORDER — CYCLOBENZAPRINE HCL 10 MG PO TABS: 10.0000 mg | ORAL_TABLET | Freq: Three times a day (TID) | ORAL | Status: AC | PRN

## 2011-04-28 NOTE — Progress Notes (Signed)
UR COMPLETED  

## 2011-04-28 NOTE — Progress Notes (Signed)
Subjective: Patient reports Is doing well arms and hands feel better excellent sore but she swallowing okay.  Objective: Vital signs in last 24 hours: Temp:  [97.2 F (36.2 C)-98.1 F (36.7 C)] 97.6 F (36.4 C) (03/12 0800) Pulse Rate:  [82-102] 82  (03/12 0800) Resp:  [12-33] 16  (03/12 0800) BP: (121-198)/(42-104) 129/62 mmHg (03/12 0800) SpO2:  [92 %-100 %] 97 % (03/12 0800)  Intake/Output from previous day: 03/11 0701 - 03/12 0700 In: 2040 [P.O.:720; I.V.:1300] Out: 205 [Drains:55; Blood:150] Intake/Output this shift:    Strength 5 out of 5 wound clean and dry  Lab Results: No results found for this basename: WBC:2,HGB:2,HCT:2,PLT:2 in the last 72 hours BMET No results found for this basename: NA:2,K:2,CL:2,CO2:2,GLUCOSE:2,BUN:2,CREATININE:2,CALCIUM:2 in the last 72 hours  Studies/Results: Dg Cervical Spine 1 View  04/27/2011  *RADIOLOGY REPORT*  Clinical Data: Neck pain  DG CERVICAL SPINE - 1 VIEW  Comparison: None.  Findings: Intraoperative C-arm films document C3-C5 ACDF with anterior plating.  Grossly satisfactory position and alignment. Recommend formal cervical spine series postoperatively to confirm placement and alignment.  IMPRESSION: As above.  Original Report Authenticated By: Elsie Stain, M.D.    Assessment/Plan: As of a 1 from ACDF C3-4 C4-5 discharge date the  LOS: 1 day     Emmamarie Kluender P 04/28/2011, 9:51 AM

## 2011-04-28 NOTE — Discharge Summary (Signed)
  Physician Discharge Summary  Patient ID: [name] MRN: [id number] DOB/AGE: 67-Aug-1947 67 y.o.  Admit date: 04/27/2011 Discharge date: 04/28/2011  Admission Diagnoses: Cervical spinal it myelopathy from stenosis C3-4 and C4-5  Discharge Diagnoses: Same Active Problems:  * No active hospital problems. *    Discharged Condition: good  Hospital Course: Patient was admitted to the hospital underwent ACDF C3-4 and C4-5 postoperatively patient did very well recovered in the floor Lescol was angling and voiding spontaneously was tolerating a regular diet was a be discharged on postop day 1 the  Consults: Significant Diagnostic Studies: Treatments: ACDF C3-4 and C4-5 Discharge Exam: Blood pressure 129/62, pulse 82, temperature 97.6 F (36.4 C), temperature source Oral, resp. rate 16, SpO2 97.00%. Strength 5 out of 5 wound clean and dry  Disposition: Home   Medication List  As of 04/28/2011  9:54 AM   TAKE these medications         aspirin 325 MG EC tablet   Take 325 mg by mouth daily.      celecoxib 200 MG capsule   Commonly known as: CELEBREX   Take 200 mg by mouth daily.      cloNIDine 0.1 MG tablet   Commonly known as: CATAPRES   Take 1 tablet (0.1 mg total) by mouth 3 (three) times daily.      cyclobenzaprine 10 MG tablet   Commonly known as: FLEXERIL   Take 10 mg by mouth 3 (three) times daily as needed. For muscle pain      cyclobenzaprine 10 MG tablet   Commonly known as: FLEXERIL   Take 1 tablet (10 mg total) by mouth 3 (three) times daily as needed for muscle spasms.      glipiZIDE 5 MG 24 hr tablet   Commonly known as: GLUCOTROL XL   Take 5 mg by mouth 2 (two) times daily.      lisinopril-hydrochlorothiazide 20-25 MG per tablet   Commonly known as: PRINZIDE,ZESTORETIC   Take 1 tablet by mouth daily.      metFORMIN 1000 MG tablet   Commonly known as: GLUCOPHAGE   Take 1,000 mg by mouth 2 (two) times daily with a meal.      metoprolol succinate 25 MG  24 hr tablet   Commonly known as: TOPROL-XL   Take 25 mg by mouth daily.      naproxen 375 MG tablet   Commonly known as: NAPROSYN   Take 375 mg by mouth 3 (three) times daily with meals.      ONGLYZA 5 MG Tabs tablet   Generic drug: saxagliptin HCl   Take 5 mg by mouth daily.      oxyCODONE-acetaminophen 5-325 MG per tablet   Commonly known as: PERCOCET   Take 1-2 tablets by mouth every 4 (four) hours as needed.      predniSONE 10 MG tablet   Commonly known as: DELTASONE   Take 10-40 mg by mouth daily. Take 4 tablets for 2 days, then take 3 tablets for 2 days, then take 2 tablets for 2 days and then take 1 tablet for 2 days. First dose was taken on 04/13/11             Signed: Kosha Jaquith P 04/28/2011, 9:54 AM

## 2011-05-04 ENCOUNTER — Encounter (HOSPITAL_COMMUNITY): Payer: Self-pay | Admitting: Neurosurgery

## 2011-06-09 ENCOUNTER — Ambulatory Visit: Payer: Self-pay | Admitting: Vascular Surgery

## 2011-06-09 LAB — BASIC METABOLIC PANEL
Calcium, Total: 8.8 mg/dL (ref 8.5–10.1)
EGFR (African American): >60
Potassium: 3.7 mmol/L (ref 3.5–5.1)
Sodium: 141 mmol/L (ref 136–145)

## 2011-12-09 ENCOUNTER — Ambulatory Visit: Payer: Self-pay | Admitting: Vascular Surgery

## 2011-12-09 LAB — CREATININE, SERUM: EGFR (African American): >60

## 2011-12-09 LAB — BUN: BUN: 21 mg/dL — ABNORMAL HIGH (ref 7–18)

## 2013-08-10 ENCOUNTER — Emergency Department: Payer: Self-pay | Admitting: Emergency Medicine

## 2013-08-10 LAB — CBC
HCT: 36.1 % (ref 35.0–47.0)
HGB: 11.5 g/dL — AB (ref 12.0–16.0)
MCH: 28.9 pg (ref 26.0–34.0)
MCHC: 31.7 g/dL — AB (ref 32.0–36.0)
MCV: 91 fL (ref 80–100)
PLATELETS: 196 10*3/uL (ref 150–440)
RBC: 3.97 10*6/uL (ref 3.80–5.20)
RDW: 13.5 % (ref 11.5–14.5)
WBC: 4 10*3/uL (ref 3.6–11.0)

## 2013-08-10 LAB — BASIC METABOLIC PANEL
ANION GAP: 6 — AB (ref 7–16)
BUN: 15 mg/dL (ref 7–18)
CALCIUM: 8.8 mg/dL (ref 8.5–10.1)
CHLORIDE: 105 mmol/L (ref 98–107)
CREATININE: 1.15 mg/dL (ref 0.60–1.30)
Co2: 28 mmol/L (ref 21–32)
EGFR (African American): 56 — ABNORMAL LOW
GFR CALC NON AF AMER: 49 — AB
Glucose: 310 mg/dL — ABNORMAL HIGH (ref 65–99)
OSMOLALITY: 290 (ref 275–301)
Potassium: 3.7 mmol/L (ref 3.5–5.1)
SODIUM: 139 mmol/L (ref 136–145)

## 2013-08-10 LAB — TROPONIN I: Troponin-I: <0.02 ng/mL

## 2014-06-05 NOTE — Op Note (Signed)
PATIENT NAME:  [name], [name] MR#:  [id number] DATE OF BIRTH:  [birth year]  DATE OF PROCEDURE:  12/09/2011  PREOPERATIVE DIAGNOSES:  1. Peripheral arterial disease with rest pain, left lower extremity.  2. Morbid obesity.  3. Diabetes.  4. Hypertension.   POSTOPERATIVE DIAGNOSES: 1. Peripheral arterial disease with rest pain, left lower extremity.  2. Morbid obesity.  3. Diabetes.  4. Hypertension.   PROCEDURES:  1. Ultrasound guidance for vascular access, right femoral artery.  2. Catheter placement into left popliteal artery from right femoral approach.  3. Aortogram and selective left lower extremity angiogram.  4. Percutaneous transluminal angioplasty of below-knee popliteal artery with 5 mm diameter angioplasty balloon.  5. Percutaneous transluminal angioplasty of SFA and previously placed stent with 6 mm diameter angioplasty balloon.  6. Viabahn stent placement for greater than 50% residual stenosis and dissection after angioplasty in the left SFA.  7. StarClose closure device, right femoral artery.   SURGEON: Annice Needy, MD   ANESTHESIA: Local with moderate conscious sedation.   ESTIMATED BLOOD LOSS: 25 mL.   INDICATION FOR PROCEDURE: This is a 69 year old African American female with rest pain in the left foot. She had previous intervention but has recurrent stenosis in the left SFA on noninvasive study and a marked drop in her ABI. She desires intervention for relief of her rest pain.   DESCRIPTION OF PROCEDURE: The patient is brought to the Vascular Interventional Radiology Suite. Groins were shaved and prepped and a sterile surgical field was created. The right femoral head was localized with fluoroscopy. Due to poor anatomic landmarks, ultrasound is used to access a patent right femoral artery. This was done without difficulty under direct ultrasound guidance with a Seldinger needle and permanent image was recorded. A J-wire and 5 French sheath were placed. Pigtail catheter  was placed in the aorta at the L1 and AP aortogram was performed. This showed what appeared to be two left renal arteries and right renal artery which were patent. The aorta and iliac segments were patent without flow limiting stenosis. I then hooked the aortic bifurcation and advanced the left femoral head and selective left lower extremity angiogram was then performed. The previously placed stent in the mid left SFA was nearly occluded and had significant intimal hyperplasia throughout its length. There was then an approximately 75 to 80% stenosis in the popliteal artery just below the knee and one vessel runoff in the anterior tibial artery although this occluded just above the ankle. It collateralized to the peroneal and posterior tibial arteries distally. The patient was systemically heparinized. A 6 French Ansel sheath was placed over a Terumo advantage wire. I was able to cross the areas of stenosis without difficulty with a Kumpe catheter and a Terumo advantage wire and confirm intraluminal flow in the below-knee popliteal artery. I then replaced the wire and balloon angioplasty was performed with a 5 mm diameter angioplasty balloon in the popliteal artery with a 6 mm diamond angioplasty balloon in the superficial femoral artery. The popliteal lesion was markedly improved with some mild residual stenosis and dissection was not flow limiting. The SFA lesion still had a significant dissection particularly at the distal point of the previously placed stent and this was causing flow limitation greater than 50% stenosis. For this reason, a 6 mm diameter x 10 cm length Viabahn stent was selected. A Viabahn stent was used to help reduce intimal hyperplasia in the future. This was postdilated with a 6 mm balloon with excellent angiographic  completion result. At this point I elected to terminate the procedure. The sheath was pulled back to the ipsilateral and external and carotid oblique arteriogram was performed.  StarClose closure device was deployed in the usual fashion with excellent hemostatic result. The patient tolerated the procedure well and was taken to the recovery room in stable condition.   ____________________________ Annice NeedyJason S. Karmella Bouvier, MD jsd:drc D: 12/09/2011 11:13:45 ET T: 12/09/2011 11:22:24 ET JOB#: [id number]  cc: Annice NeedyJason S. Dakota Vanwart, MD, <Dictator> Marlyn CorporalFayegh H. Jadali, MD Annice NeedyJASON S Daveyon Kitchings MD ELECTRONICALLY SIGNED 12/13/2011 9:14

## 2014-06-10 NOTE — Op Note (Signed)
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   PROCEDURES PERFORMED:  1. Abdominal aortogram.  2. Left lower extremity distal runoff, third order catheter placement.  3. Crosser atherectomy left SFA.  4. Percutaneous transluminal angioplasty and stent placement left SFA for failed angioplasty.  5. Crosser atherectomy of the left posterior tibial artery.  6. Percutaneous transluminal angioplasty of the left posterior tibial artery.  7. Closure device right common femoral artery.   SURGEON: Levora Dredge, MD    SEDATION: Versed 8 mg plus fentanyl 350 mcg administered IV. Continuous ECG, pulse oximetry, and cardiopulmonary monitoring is performed throughout the entire procedure by the interventional radiology nurse. Total sedation time is 1 hour, 30 minutes.   ACCESS:  7 French sheath right common femoral artery.   CONTRAST USED: Isovue 105 mL.   FLUOROSCOPY TIME: 11 minutes.   INDICATIONS: [name] is a 69 year old woman who presents to the office with worsening pain in her left lower extremity. Physical examination as well as noninvasive studies defined critical limb ischemia and she is undergoing angiography with the hope for intervention. The risks and benefits were reviewed. All questions are answered. The patient agrees to proceed.   DESCRIPTION OF PROCEDURE: The patient is taken to the special procedures suite and placed in the supine position. After adequate sedation is achieved, the right groin is prepped and draped in sterile fashion. 1% lidocaine is infiltrated into the soft tissues. Ultrasound is placed in a sterile sleeve. Ultrasound is utilized secondary to  lack of appropriate landmarks and to avoid vascular injury. Under direct ultrasound visualization, the common femoral artery is identified. It is echolucent and pulsatile, indicating patency. Image is recorded. Micropuncture needle is then used to access the anterior wall under direct ultrasound visualization.   Microwire followed micro sheath, Amplatz superstiff wire followed by a 5 French sheath and 5 French pigtail catheter were then inserted. The pigtail catheter is positioned at T12 and AP projection of the aorta is obtained. Pigtail catheter is repositioned and RAO projection of the pelvis is obtained. A stiff angled Glidewire and pigtail catheter are then used to cross the bifurcation. The catheter is advanced down first into the common femoral and then into the superficial femoral. Hand injection of contrast is utilized to demonstrate the anatomy. After identifying the occlusion of the superficial femoral artery,  5000 units of heparin is given. The Amplatz superstiff wire is reintroduced through the pigtail catheter and then the pigtail catheter and 5 French sheath are removed. A 7 [place] Raabe sheath is then advanced up and over the aortic bifurcation across the Amplatz wire, positioned with the tip of the [place] in approximately  the mid SFA. Hand injection of contrast is then to demonstrate the occlusion in magnified views and obliquity secondary to several large collaterals. The S6 crosser atherectomy is then opened on the field and prepped, out of body is checked, which is fine and subsequently a V18 wire with a straight Usher catheter is advanced down to the level of the occlusion. The S6 device is then advanced through the Usher catheter after the wire was removed. The S6 device is used to cross the occluded segment. Once the Usher catheter is well beyond the distal re-entry  point, the S6 is removed. Blood is returned and hand injection of contrast demonstrates intraluminal placement. Using a  combination of the V18 wire and the Usher catheter, the catheter is negotiated down to the distal popliteal where magnified views of the trifurcation are obtained. The origin of the posterior tibial is identified, but there are multiple occlusions along the path of the posterior tibial and the Usher catheter is then with the combination of the V18 wire negotiated into the stump of the posterior tibial. The S6 device is then introduced back into the Usher catheter and, using a combination of the S6 and the Usher, the posterior tibial artery is negotiated through multiple complete occlusions down to the level of the ankle where hand injection of contrast demonstrates patency filling the lateral plantar and the digital arteries.   An 0.014 grand slam wire is then advanced through the Usher catheter and the Usher catheter is pulled back to the origin of the posterior tibial with a Tuohy-Borst placed. Magnified images of the posterior tibial are then obtained and a 3 x 30 Ultraverse balloon is selected. This is advanced down to the level of the ankle and 3-mm balloon inflation is then performed to 12 atmospheres for one minute. Follow-up angiography demonstrates that the posterior tibial artery is now widely patent down to the ankle, filling the lateral plantar. There is a segment of the tibioperoneal trunk and popliteal, which remain somewhat under dilated, and a 3.5 x 8 balloon Ultraverse is advanced over the wire and inflated to 12 atmospheres for one minute. Follow-up demonstrates significant improvement with matching of the luminal gain within the TP trunk.   A 5 x 6 balloon is then advanced across the occluded segment of the SFA. Two separate inflations to 10 atmospheres are performed. Follow-up angiography demonstrates a significant flow-limiting dissection and a 6 x 8 LifeStent is deployed across this area and postdilated using the 5-mm balloon. Follow-up angiography beginning in the mid SFA down to the ankle  is then performed, demonstrating wide patency of the SFA, popliteal, tibioperoneal trunk, and posterior tibial.   The sheath is then pulled into the right external iliac, oblique view is obtained, and a StarClose device deployed without difficulty. There are no immediate complications.   INTERPRETATION: The abdominal aorta is opacified with a bolus injection of contrast. The AP projection demonstrates patency of both renal arteries, no evidence of renal artery stenosis. Single renal artery are noted. Aorta is free of hemodynamically significant stenoses. The common iliac arteries, external iliac, and internal iliac arteries are patent bilaterally.   The left common femoral and profunda femoris are widely patent. Superficial femoral artery is widely patent in its proximal two-thirds. At [facility] canal it occludes and remains occluded over several centimeters. The  popliteal artery is reconstituted just at the level of the femoral condyles and remains patent down to the trifurcation. Anterior tibial artery is patent in its proximal two-thirds but occludes distally. The tibioperoneal trunk demonstrates a high-grade string sign. The peroneal artery is occluded in its proximal one-half  but reconstitutes distally. The posterior tibial is occluded segmentally throughout its course down to the ankle.   Following angioplasty to 3 mm the posterior tibial is well treated with minimal residual stenosis. Following angioplasty to 3.5, the tibioperoneal trunk and popliteal are well treated and now match quite nicely the tibial vessels. Following angioplasty there is a significant dissection in the superficial femoral artery.  This is treated with a 6-mm LifeStent post dilated to 5  mm.   SUMMARY: Successful salvage of the left lower extremity, multi levels as described above.   ____________________________ Renford DillsGregory G. Schnier, MD ggs:bjt D: 06/09/2011 17:03:04 ET T: 06/10/2011 07:54:26 ET JOB#: [id number] Latina CraverGREGORY G  SCHNIER MD ELECTRONICALLY SIGNED 06/26/2011 7:51

## 2014-10-04 DIAGNOSIS — E781 Pure hyperglyceridemia: Secondary | ICD-10-CM | POA: Diagnosis not present

## 2014-10-04 DIAGNOSIS — I10 Essential (primary) hypertension: Secondary | ICD-10-CM | POA: Diagnosis not present

## 2014-10-04 DIAGNOSIS — E119 Type 2 diabetes mellitus without complications: Secondary | ICD-10-CM | POA: Diagnosis not present

## 2014-10-04 DIAGNOSIS — G609 Hereditary and idiopathic neuropathy, unspecified: Secondary | ICD-10-CM | POA: Diagnosis not present

## 2014-10-09 DIAGNOSIS — E119 Type 2 diabetes mellitus without complications: Secondary | ICD-10-CM | POA: Diagnosis not present

## 2014-10-09 DIAGNOSIS — E784 Other hyperlipidemia: Secondary | ICD-10-CM | POA: Diagnosis not present

## 2014-10-09 DIAGNOSIS — I158 Other secondary hypertension: Secondary | ICD-10-CM | POA: Diagnosis not present

## 2014-10-19 DIAGNOSIS — I739 Peripheral vascular disease, unspecified: Secondary | ICD-10-CM | POA: Diagnosis not present

## 2014-10-19 DIAGNOSIS — M7989 Other specified soft tissue disorders: Secondary | ICD-10-CM | POA: Diagnosis not present

## 2014-10-19 DIAGNOSIS — I10 Essential (primary) hypertension: Secondary | ICD-10-CM | POA: Diagnosis not present

## 2014-10-19 DIAGNOSIS — I70213 Atherosclerosis of native arteries of extremities with intermittent claudication, bilateral legs: Secondary | ICD-10-CM | POA: Diagnosis not present

## 2014-10-24 ENCOUNTER — Other Ambulatory Visit: Payer: Self-pay | Admitting: Vascular Surgery

## 2014-10-25 DIAGNOSIS — M5023 Other cervical disc displacement, cervicothoracic region: Secondary | ICD-10-CM | POA: Diagnosis not present

## 2014-10-29 ENCOUNTER — Encounter
Admission: RE | Disposition: A | Discharge status: Home / Self Care | Payer: Self-pay | Source: Ambulatory Visit | Attending: Vascular Surgery

## 2014-10-29 ENCOUNTER — Ambulatory Visit
Admission: RE | Admit: 2014-10-29 | Discharge: 2014-10-29 | Disposition: A | Discharge status: Home / Self Care | Payer: Medicare Other | Source: Ambulatory Visit | Attending: Vascular Surgery | Admitting: Vascular Surgery

## 2014-10-29 ENCOUNTER — Encounter: Payer: Self-pay | Admitting: *Deleted

## 2014-10-29 DIAGNOSIS — Z6841 Body Mass Index (BMI) 40.0 and over, adult: Secondary | ICD-10-CM | POA: Insufficient documentation

## 2014-10-29 DIAGNOSIS — E785 Hyperlipidemia, unspecified: Secondary | ICD-10-CM | POA: Diagnosis not present

## 2014-10-29 DIAGNOSIS — E119 Type 2 diabetes mellitus without complications: Secondary | ICD-10-CM | POA: Diagnosis not present

## 2014-10-29 DIAGNOSIS — Z79899 Other long term (current) drug therapy: Secondary | ICD-10-CM | POA: Diagnosis not present

## 2014-10-29 DIAGNOSIS — Z87891 Personal history of nicotine dependence: Secondary | ICD-10-CM | POA: Diagnosis not present

## 2014-10-29 DIAGNOSIS — Z7982 Long term (current) use of aspirin: Secondary | ICD-10-CM | POA: Diagnosis not present

## 2014-10-29 DIAGNOSIS — I10 Essential (primary) hypertension: Secondary | ICD-10-CM | POA: Insufficient documentation

## 2014-10-29 DIAGNOSIS — I70222 Atherosclerosis of native arteries of extremities with rest pain, left leg: Secondary | ICD-10-CM | POA: Insufficient documentation

## 2014-10-29 HISTORY — PX: PERIPHERAL VASCULAR CATHETERIZATION: SHX172C

## 2014-10-29 LAB — BASIC METABOLIC PANEL
ANION GAP: 8 (ref 5–15)
BUN: 20 mg/dL (ref 6–20)
CALCIUM: 9.3 mg/dL (ref 8.9–10.3)
CO2: 24 mmol/L (ref 22–32)
Chloride: 111 mmol/L (ref 101–111)
Creatinine, Ser: 0.99 mg/dL (ref 0.44–1.00)
GFR calc Af Amer: >60 mL/min (ref >60)
GFR, EST NON AFRICAN AMERICAN: 56 mL/min — AB (ref >60)
GLUCOSE: 87 mg/dL (ref 65–99)
Potassium: 4.4 mmol/L (ref 3.5–5.1)
SODIUM: 143 mmol/L (ref 135–145)

## 2014-10-29 LAB — GLUCOSE, CAPILLARY: GLUCOSE-CAPILLARY: 119 mg/dL — AB (ref 65–99)

## 2014-10-29 SURGERY — LOWER EXTREMITY ANGIOGRAPHY
Anesthesia: Moderate Sedation | Laterality: Left

## 2014-10-29 MED ORDER — DEXTROSE 50 % IV SOLN
INTRAVENOUS | Status: AC
  Filled 2014-10-29: qty 50

## 2014-10-29 MED ORDER — ACETAMINOPHEN 325 MG PO TABS: 325.0000 mg | ORAL_TABLET | ORAL | Status: DC | PRN

## 2014-10-29 MED ORDER — HYDRALAZINE HCL 20 MG/ML IJ SOLN
INTRAMUSCULAR | Status: AC
  Filled 2014-10-29: qty 1

## 2014-10-29 MED ORDER — LABETALOL HCL 5 MG/ML IV SOLN: 10.0000 mg | INTRAVENOUS | Status: DC | PRN

## 2014-10-29 MED ORDER — IOHEXOL 300 MG/ML  SOLN
INTRAMUSCULAR | Status: DC | PRN
  Administered 2014-10-29: 135 mL via INTRA_ARTERIAL

## 2014-10-29 MED ORDER — SODIUM CHLORIDE 0.9 % IV SOLN: 500.0000 mL | Freq: Once | INTRAVENOUS | Status: DC | PRN

## 2014-10-29 MED ORDER — SODIUM CHLORIDE 0.9 % IJ SOLN
INTRAMUSCULAR | Status: AC
  Filled 2014-10-29: qty 50

## 2014-10-29 MED ORDER — HYDRALAZINE HCL 20 MG/ML IJ SOLN
5.0000 mg | INTRAMUSCULAR | Status: AC | PRN
  Administered 2014-10-29 (×3): 5 mg via INTRAVENOUS

## 2014-10-29 MED ORDER — CEFAZOLIN SODIUM 1-5 GM-% IV SOLN
INTRAVENOUS | Status: AC
  Filled 2014-10-29: qty 50

## 2014-10-29 MED ORDER — FENTANYL CITRATE (PF) 100 MCG/2ML IJ SOLN
INTRAMUSCULAR | Status: DC | PRN
  Administered 2014-10-29 (×5): 50 ug via INTRAVENOUS

## 2014-10-29 MED ORDER — ONDANSETRON HCL 4 MG/2ML IJ SOLN: 4.0000 mg | Freq: Four times a day (QID) | INTRAMUSCULAR | Status: DC | PRN

## 2014-10-29 MED ORDER — NITROGLYCERIN 1 MG/10 ML FOR IR/CATH LAB
INTRA_ARTERIAL | Status: DC | PRN
  Administered 2014-10-29: 250 ug via INTRA_ARTERIAL

## 2014-10-29 MED ORDER — LABETALOL HCL 5 MG/ML IV SOLN
INTRAVENOUS | Status: DC | PRN
  Administered 2014-10-29: 20 mg via INTRAVENOUS

## 2014-10-29 MED ORDER — GUAIFENESIN-DM 100-10 MG/5ML PO SYRP: 15.0000 mL | ORAL_SOLUTION | ORAL | Status: DC | PRN

## 2014-10-29 MED ORDER — CLINDAMYCIN PHOSPHATE 300 MG/50ML IV SOLN
INTRAVENOUS | Status: AC
  Filled 2014-10-29: qty 50

## 2014-10-29 MED ORDER — FENTANYL CITRATE (PF) 100 MCG/2ML IJ SOLN
INTRAMUSCULAR | Status: AC
  Filled 2014-10-29: qty 2

## 2014-10-29 MED ORDER — HYDROMORPHONE HCL 1 MG/ML IJ SOLN
0.5000 mg | INTRAMUSCULAR | Status: DC | PRN
  Administered 2014-10-29: 1 mg via INTRAVENOUS

## 2014-10-29 MED ORDER — DEXTROSE 50 % IV SOLN
INTRAVENOUS | Status: DC | PRN
  Administered 2014-10-29: 25 mL via INTRAVENOUS

## 2014-10-29 MED ORDER — MIDAZOLAM HCL 2 MG/2ML IJ SOLN
INTRAMUSCULAR | Status: DC | PRN
  Administered 2014-10-29: 2 mg via INTRAVENOUS
  Administered 2014-10-29 (×3): 1 mg via INTRAVENOUS

## 2014-10-29 MED ORDER — MIDAZOLAM HCL 5 MG/5ML IJ SOLN
INTRAMUSCULAR | Status: AC
  Filled 2014-10-29: qty 5

## 2014-10-29 MED ORDER — SODIUM CHLORIDE 0.9 % IV SOLN
INTRAVENOUS | Status: DC
  Administered 2014-10-29 (×2): via INTRAVENOUS

## 2014-10-29 MED ORDER — LABETALOL HCL 5 MG/ML IV SOLN
INTRAVENOUS | Status: AC
  Filled 2014-10-29: qty 4

## 2014-10-29 MED ORDER — LIDOCAINE-EPINEPHRINE (PF) 1 %-1:200000 IJ SOLN
INTRAMUSCULAR | Status: AC
  Filled 2014-10-29: qty 30

## 2014-10-29 MED ORDER — HYDROMORPHONE HCL 1 MG/ML IJ SOLN
INTRAMUSCULAR | Status: AC
  Administered 2014-10-29: 1 mg via INTRAVENOUS
  Filled 2014-10-29: qty 1

## 2014-10-29 MED ORDER — METOPROLOL TARTRATE 1 MG/ML IV SOLN: 2.0000 mg | INTRAVENOUS | Status: DC | PRN

## 2014-10-29 MED ORDER — HEPARIN SODIUM (PORCINE) 1000 UNIT/ML IJ SOLN
INTRAMUSCULAR | Status: DC | PRN
  Administered 2014-10-29: 5000 [IU] via INTRAVENOUS

## 2014-10-29 MED ORDER — SODIUM BICARBONATE BOLUS VIA INFUSION
INTRAVENOUS | Status: AC
  Administered 2014-10-29: 09:00:00 via INTRAVENOUS
  Filled 2014-10-29: qty 1

## 2014-10-29 MED ORDER — OXYCODONE-ACETAMINOPHEN 5-325 MG PO TABS: 1.0000 | ORAL_TABLET | ORAL | Status: DC | PRN

## 2014-10-29 MED ORDER — LIDOCAINE-EPINEPHRINE (PF) 1 %-1:200000 IJ SOLN
INTRAMUSCULAR | Status: DC | PRN
  Administered 2014-10-29: 10 mL via INTRADERMAL

## 2014-10-29 MED ORDER — NITROGLYCERIN 5 MG/ML IV SOLN
INTRAVENOUS | Status: AC
  Filled 2014-10-29: qty 10

## 2014-10-29 MED ORDER — ACETAMINOPHEN 325 MG RE SUPP: 325.0000 mg | RECTAL | Status: DC | PRN

## 2014-10-29 MED ORDER — HEPARIN SODIUM (PORCINE) 1000 UNIT/ML IJ SOLN
INTRAMUSCULAR | Status: AC
  Filled 2014-10-29: qty 1

## 2014-10-29 MED ORDER — HEPARIN (PORCINE) IN NACL 2-0.9 UNIT/ML-% IJ SOLN
INTRAMUSCULAR | Status: AC
  Filled 2014-10-29: qty 1000

## 2014-10-29 MED ORDER — CLINDAMYCIN PHOSPHATE 300 MG/50ML IV SOLN
300.0000 mg | Freq: Once | INTRAVENOUS | Status: AC
  Administered 2014-10-29: 300 mg via INTRAVENOUS

## 2014-10-29 MED ORDER — ALTEPLASE 2 MG IJ SOLR
INTRAMUSCULAR | Status: AC
  Filled 2014-10-29: qty 4

## 2014-10-29 MED ORDER — SODIUM BICARBONATE 8.4 % IV SOLN
INTRAVENOUS | Status: AC
  Administered 2014-10-29: 10:00:00 via INTRAVENOUS
  Filled 2014-10-29: qty 500

## 2014-10-29 MED ORDER — PHENOL 1.4 % MT LIQD: 1.0000 | OROMUCOSAL | Status: DC | PRN

## 2014-10-29 SURGICAL SUPPLY — 22 items
BAG DECANTER STRL (MISCELLANEOUS) ×2 IMPLANT
BALLN LUTONIX 5X150X130 (BALLOONS) ×2
BALLN LUTONIX DCB 4X100X130 (BALLOONS) ×2
BALLN ULTRVRSE 3X80X150 (BALLOONS) ×1
BALLN ULTRVRSE 3X80X150 OTW (BALLOONS) ×1
BALLOON LUTONIX 5X150X130 (BALLOONS) ×1 IMPLANT
BALLOON LUTONIX DCB 4X100X130 (BALLOONS) ×1 IMPLANT
BALLOON ULTRVRSE 3X80X150 OTW (BALLOONS) ×1 IMPLANT
CATH PIG 70CM (CATHETERS) ×2 IMPLANT
CATH VERT 100CM (CATHETERS) ×2 IMPLANT
DEVICE PRESTO INFLATION (MISCELLANEOUS) ×2 IMPLANT
DEVICE SOLENT OMNI 120CM (CATHETERS) ×2 IMPLANT
DEVICE STARCLOSE SE CLOSURE (Vascular Products) ×2 IMPLANT
GLIDEWIRE ADV .035X260CM (WIRE) ×2 IMPLANT
GUIDEWIRE PFTE-COATED .018X300 (WIRE) ×2 IMPLANT
PACK ANGIOGRAPHY (CUSTOM PROCEDURE TRAY) ×2 IMPLANT
SHEATH ANL2 6FRX45 HC (SHEATH) ×2 IMPLANT
SHEATH BRITE TIP 5FRX11 (SHEATH) ×2 IMPLANT
STENT VIABAHN 6X250X120 (Permanent Stent) ×2 IMPLANT
SYR MEDRAD MARK V 150ML (SYRINGE) ×2 IMPLANT
TUBING CONTRAST HIGH PRESS 72 (TUBING) ×2 IMPLANT
WIRE J 3MM .035X145CM (WIRE) ×2 IMPLANT

## 2014-10-29 NOTE — Discharge Instructions (Signed)
Angiogram, Care After Refer to this sheet in the next few weeks. These instructions provide you with information on caring for yourself after your procedure. Your health care provider may also give you more specific instructions. Your treatment has been planned according to current medical practices, but problems sometimes occur. Call your health care provider if you have any problems or questions after your procedure.  WHAT TO EXPECT AFTER THE PROCEDURE After your procedure, it is typical to have the following sensations:  Minor discomfort or tenderness and a small bump at the catheter insertion site. The bump should usually decrease in size and tenderness within 1 to 2 weeks.  Any bruising will usually fade within 2 to 4 weeks. HOME CARE INSTRUCTIONS   You may need to keep taking blood thinners if they were prescribed for you. Take medicines only as directed by your health care provider.  Do not apply powder or lotion to the site.  Do not take baths, swim, or use a hot tub until your health care provider approves.  You may shower 24 hours after the procedure. Remove the bandage (dressing) and gently wash the site with plain soap and water. Gently pat the site dry.  Inspect the site at least twice daily.  Limit your activity for the first 48 hours. Do not bend, squat, or lift anything over 20 lb (9 kg) or as directed by your health care provider.  Plan to have someone take you home after the procedure. Follow instructions about when you can drive or return to work. SEEK MEDICAL CARE IF:  You get light-headed when standing up.  You have drainage (other than a small amount of blood on the dressing).  You have chills.  You have a fever.  You have redness, warmth, swelling, or pain at the insertion site. SEEK IMMEDIATE MEDICAL CARE IF:   You develop chest pain or shortness of breath, feel faint, or pass out.  You have bleeding, swelling larger than a walnut, or drainage from the  catheter insertion site.  You develop pain, discoloration, coldness, or severe bruising in the leg or arm that held the catheter.  You develop bleeding from any other place, such as the bowels. You may see bright red blood in your urine or stools, or your stools may appear black and tarry.  You have heavy bleeding from the site. If this happens, hold pressure on the site. MAKE SURE YOU:  Understand these instructions.  Will watch your condition.  Will get help right away if you are not doing well or get worse. Document Released: 08/21/2004 Document Revised: 06/19/2013 Document Reviewed: 06/27/2012 [facility] Patient Information 2015 [place], [place]. This information is not intended to replace advice given to you by your health care provider. Make sure you discuss any questions you have with your health care provider.   Start  Eliquis 2.5 mg daily Reduce aspirin to 81 mg daily Norco for pain as needed every 6 hours

## 2014-10-29 NOTE — Op Note (Signed)
[facility] Percutaneous Study/Intervention Procedural Note   Date of Surgery: 10/29/2014  Surgeon(s):DEW,JASON   Assistants:none  Pre-operative Diagnosis: PAD with rest pain LLE, diabetese, hypertension  Post-operative diagnosis: Same with thrombosis of previous left leg intervention  Procedure(s) Performed: 1. Ultrasound guidance for vascular access right femoral artery 2. Catheter placement into left PT artery from right femoral approach 3. Aortogram and selective left lower extremity angiogram 4. Percutaneous transluminal angioplasty of left SFA with 5 mm diameter x 15 cm length Lutonix drug coated balloon 5. Percutaneous transluminal angioplasty of left tibioperoneal trunk and posterior tibial artery proximally with 3 mm diameter by 8 cm length angioplasty balloon  6.  Catheter directed thrombolysis with 4 mg of TPA delivered with the AngioJet Omni catheter and a power pulse spray fashion to the left SFA, popliteal, tibioperoneal trunk, and posterior tibial arteries  7. Mechanical rheolytic thrombectomy with the AngioJet Omni catheter to the left SFA, popliteal, tibioperoneal trunk, and posterior tibial arteries.  8. Percutaneous transluminal angioplasty of the below-knee popliteal artery with 4 mm diameter by 10 cm length Lutonix drug-coated angioplasty balloon  9. Viabahn covered stent placement to the left SFA and popliteal artery for greater than 50% residual stenosis and thrombosis after above interventions with a 6 mm diameter by 25 cm length stent 10. StarClose closure device right femoral artery  EBL: 25 cc  Fluoroscopy time: 11.6 minutes  Contrast: 135 cc  Indications: Patient is a 69 year old female with severe peripheral vascular disease status post previous intervention. The patient has noninvasive study showing markedly reduced ABI and occlusion of  her previous intervention in the left SFA. The patient is brought in for angiography for further evaluation and potential treatment. Risks and benefits are discussed and informed consent is obtained  Procedure: The patient was identified and appropriate procedural time out was performed. The patient was then placed supine on the table and prepped and draped in the usual sterile fashion. Ultrasound was used to evaluate the right common femoral artery. It was patent . A digital ultrasound image was acquired. A Seldinger needle was used to access the right common femoral artery under direct ultrasound guidance and a permanent image was performed. A 0.035 J wire was advanced without resistance and a 5Fr sheath was placed. Pigtail catheter was placed into the aorta and an AP aortogram was performed. This demonstrated normal renal arteries and normal aorta and iliac segments without significant stenosis. I then crossed the aortic bifurcation and advanced to the left femoral head and into the proximal left superficial femoral artery. Selective left lower extremity angiogram was then performed. This demonstrated occlusion just above the previous left SFA stent with some reconstitution in the popliteal artery distally. Distal opacification was somewhat poor, but she appeared to have stenosis in the tibioperoneal trunk and proximal posterior tibial artery as well as an anterior tibial artery that had flow distally. The patient was systemically heparinized with 5000 units of intravenous heparin and a 6 [place] Ansell sheath was then placed over the [employer] wire. I then used a Kumpe catheter and the advantage wire to navigate through the SFA occlusion and confirm intraluminal flow in the popliteal artery. I then exchanged for a 0.018 wire and advanced through the tibioperoneal trunk and proximal posterior tibial artery stenosis confirming intraluminal flow in the posterior tibial artery I treated the proximal  posterior tibial artery and tibioperoneal trunk with a 3 mm diameter by 8 cm length angioplasty balloon inflated to 12 atm for 1  minute. The SFA and popliteal lesion was treated with a 5 mm diameter by 15 cm length Lutonix drug-coated angioplasty balloon inflated twice to 8 atm. Following the initial interventions, a large amount residual thrombus was seen throughout the stent as well as in the popliteal artery and tibioperoneal trunk distally. It was clear she had thrombosed her previous intervention and this did not improve with the initial angioplasty. I then elected to use the AngioJet device to try to improve the situation. 4 mg of TPA was delivered and a power pulse spray fashion from the left superficial femoral artery down through the popliteal artery tibioperoneal trunk and into the proximal posterior tibial artery. This was allowed to dwell for 15 minutes. I then performed mechanical rheolytic thrombectomy to the same vessels down to the proximal posterior tibial artery. Following this the stent still had a large volume of flow-limiting thrombus and stenosis of greater than 80% and there was narrowing seen in the below-knee popliteal artery which was likely a combination of native disease and thrombus. I treated the below-knee popliteal lesion with a 4 mm diameter by 10 cm length Lutonix drug-coated angioplasty balloon and ran the AngioJet Omni catheter again the below-knee popliteal artery and tibioperoneal trunk. I then deployed a 6 mm diameter by 25 cm length Viabahn covered stent from the popliteal artery just above the knee up to the mid to proximal superficial femoral artery and postdilated this with a 5 mm balloon. Some distal spasm was treated with intra-arterial nitroglycerin and completion angiogram showed excellent flow through the SFA and popliteal stent without significant residual stenosis. The below-knee popliteal artery tibioperoneal trunk and posterior tibial artery were now patent without  flow-limiting stenosis of more than 30% residual. At this point I felt we had significantly improved her flow and there is nothing more we could do from a percutaneous standpoint. I elected to terminate the procedure. The sheath was removed and StarClose closure device was deployed in the left femoral artery with excellent hemostatic result. The patient was taken to the recovery room in stable condition having tolerated the procedure well.  Findings:  Aortogram: Normal aorta and iliac arteries and normal renal arteries. Left Lower Extremity: Severe multilevel disease as described above improved with intervention   Disposition: Patient was taken to the recovery room in stable condition having tolerated the procedure well.  Complications: None  DEW,JASON 10/29/2014 10:54 AM

## 2014-10-29 NOTE — H&P (Signed)
  [facility] History & Physical Update  The patient was interviewed and re-examined.  The patient's previous History and Physical has been reviewed and is unchanged.  There is no change in the plan of care. We plan to proceed with the scheduled procedure.  Janett Kamath, MD  10/29/2014, 9:08 AM

## 2014-11-07 ENCOUNTER — Other Ambulatory Visit: Payer: Self-pay | Admitting: Neurosurgery

## 2014-11-07 DIAGNOSIS — M5023 Other cervical disc displacement, cervicothoracic region: Secondary | ICD-10-CM

## 2014-11-16 ENCOUNTER — Ambulatory Visit: Admission: RE | Admit: 2014-11-16 | Payer: Medicare Other | Source: Ambulatory Visit

## 2014-11-21 DIAGNOSIS — E785 Hyperlipidemia, unspecified: Secondary | ICD-10-CM | POA: Diagnosis not present

## 2014-11-21 DIAGNOSIS — E119 Type 2 diabetes mellitus without complications: Secondary | ICD-10-CM | POA: Diagnosis not present

## 2014-11-21 DIAGNOSIS — I739 Peripheral vascular disease, unspecified: Secondary | ICD-10-CM | POA: Diagnosis not present

## 2014-11-21 DIAGNOSIS — I70211 Atherosclerosis of native arteries of extremities with intermittent claudication, right leg: Secondary | ICD-10-CM | POA: Diagnosis not present

## 2014-11-28 ENCOUNTER — Ambulatory Visit
Admission: RE | Admit: 2014-11-28 | Discharge: 2014-11-28 | Disposition: A | Discharge status: Home / Self Care | Payer: Medicare Other | Source: Ambulatory Visit | Attending: Neurosurgery | Admitting: Neurosurgery

## 2014-11-28 DIAGNOSIS — M5023 Other cervical disc displacement, cervicothoracic region: Secondary | ICD-10-CM | POA: Insufficient documentation

## 2014-11-28 DIAGNOSIS — M50223 Other cervical disc displacement at C6-C7 level: Secondary | ICD-10-CM | POA: Diagnosis not present

## 2014-11-28 DIAGNOSIS — M5020 Other cervical disc displacement, unspecified cervical region: Secondary | ICD-10-CM | POA: Diagnosis not present

## 2014-11-29 DIAGNOSIS — M5023 Other cervical disc displacement, cervicothoracic region: Secondary | ICD-10-CM | POA: Diagnosis not present

## 2014-12-04 ENCOUNTER — Ambulatory Visit: Payer: Medicare Other | Attending: Neurosurgery | Admitting: Physical Therapy

## 2014-12-04 ENCOUNTER — Encounter: Payer: Self-pay | Admitting: Physical Therapy

## 2014-12-04 DIAGNOSIS — R200 Anesthesia of skin: Secondary | ICD-10-CM | POA: Diagnosis not present

## 2014-12-04 DIAGNOSIS — M542 Cervicalgia: Secondary | ICD-10-CM | POA: Diagnosis not present

## 2014-12-04 DIAGNOSIS — R29898 Other symptoms and signs involving the musculoskeletal system: Secondary | ICD-10-CM

## 2014-12-04 DIAGNOSIS — R531 Weakness: Secondary | ICD-10-CM | POA: Diagnosis not present

## 2014-12-04 NOTE — Therapy (Signed)
[facility] MAIN [facility] [address] [place], [place], [address] Phone: [phone]   Fax:  [phone]  Physical Therapy Evaluation  Patient Details  Name: [name] MRN: [id number] Date of Birth: 14-Dec-1945 Referring Provider: Dr. Earna Coderrown  Encounter Date: 12/04/2014      PT End of Session - 12/04/14 1440    Visit Number 1   Number of Visits 17   Date for PT Re-Evaluation 01/29/15   PT Start Time 0200   PT Stop Time 0240   PT Time Calculation (min) 40 min   Activity Tolerance Patient tolerated treatment well;Patient limited by pain   Behavior During Therapy [facility] for tasks assessed/performed      Past Medical History  Diagnosis Date  . Hypertension   . Diabetes mellitus     oral medications  . Anemia     as young child  . Blood transfusion     1964  . Carpal tunnel syndrome   . Arthritis     back, leg and foot    Past Surgical History  Procedure Laterality Date  . Abdominal hysterectomy    . Knee arthroplasty      right  . Appendectomy    . Anterior cervical decomp/discectomy fusion  04/27/2011    Procedure: ANTERIOR CERVICAL DECOMPRESSION/DISCECTOMY FUSION 2 LEVELS;  Surgeon: Mariam DollarGary P Cram, MD;  [facility];  Service: Neurosurgery;  Laterality: N/A;  Anterior Cervical Decompression/ Discectomy Fusion Cervical Three-Four, Cervical Four-Five  . Peripheral vascular catheterization Left 10/29/2014    Procedure: Lower Extremity Angiography;  Surgeon: Annice NeedyJason S Dew, MD;  Location: [facility] INVASIVE CV LAB;  Service: Cardiovascular;  Laterality: Left;  . Peripheral vascular catheterization Left 10/29/2014    Procedure: Lower Extremity Intervention;  Surgeon: Annice NeedyJason S Dew, MD;  Location: [facility] INVASIVE CV LAB;  Service: Cardiovascular;  Laterality: Left;    There were no vitals filed for this visit.  Visit Diagnosis:  Weakness  Neck pain  Numbness  Decreased ROM of neck      Subjective Assessment - 12/04/14 1400     Subjective right neck and should pain, hand gets numb especially while writing, arm getting tired while cooking, hard to raise arm; C4 and C5 fusion a couple years ago (2012)   Currently in Pain? Yes   Pain Score 6    Pain Location Neck   Pain Orientation Right   Pain Descriptors / Indicators Aching   Pain Type Acute pain   Pain Onset 1 to 4 weeks ago   Pain Frequency Constant            [facility] PT Assessment - 12/04/14 0001    Assessment   Medical Diagnosis neck pain   Referring Provider Dr. Earna Coderrown   Onset Date/Surgical Date 10/18/14   Hand Dominance Right   Next MD Visit 02/17/2015   Prior Therapy yes  knee   Balance Screen   Has the patient fallen in the past 6 months No   Has the patient had a decrease in activity level because of a fear of falling?  Yes   Is the patient reluctant to leave their home because of a fear of falling?  No   Prior Function   Level of Independence Independent       PAIN: 6/10; neck and shoulder; can do down into arm until the elbow; aching; hand can go numb after writing for awhile; neck hurts worse when looking down and writing  POSTURE: fwd head  PROM/AROM: Neck flex: 35 Neck ext: 30 Rot left: 40 Rot right: 30 SB right: 25 SB left: 35  Limited in shoulder abduction on right side 150 deg  STRENGTH:  Graded on a 0-5 scale Muscle Group Left Right  Shoulder flex 3+ 3+  Shoulder Abd 3+ 3+  Shoulder Ext    Shoulder IR/ER    Elbow 4+ 4  Wrist/hand    Hip Flex    Hip Abd    Hip Add    Hip Ext    Hip IR/ER    Knee Flex    Knee Ext    Ankle DF    Ankle PF    Grip right: 28 Grip left: 25 Neck flex: 4 Neck ext: 4 Neck left: 4 Neck right: 3  SENSATION: Numbess in right hand when writing     FUNCTIONAL MOBILITY: Impaired due to knee pain and weakness      OUTCOME MEASURES: TEST Outcome Interpretation  NDI 48% Moderate disability                                             PT Education  - 2014/12/24 1439    Education provided Yes   Education Details HEP    Person(s) Educated Patient   Methods Explanation   Comprehension Verbalized understanding             PT Long Term Goals - 2014/12/24 1454    PT LONG TERM GOAL #1   Title Patient will reduce modified Oswestry score to <20 as to demonstrate minimal disability with ADLs including improved sleeping tolerance, walking/sitting tolerance etc for better mobility with ADLs  01/29/15   PT LONG TERM GOAL #2   Title Patient will improve AROM UE so they are able to perform overhead ADL's such as reaching into cabinets.  01/29/15   PT LONG TERM GOAL #3   Title Patient will be able to perform household work/ chores without increase in symptoms. 01/29/15   PT LONG TERM GOAL #4   Title Patient will report a worst pain of 3/10 on VAS in neck and shoulder     to improve tolerance with ADLs and reduced symptoms with activities  01/29/15               Plan - 12/24/14 1441    Clinical Impression Statement This 69 yr old female presents with neck and  right shoulder pain and right hand numbness that is 6/10. She has decreased AROM to neck and right shoulder abduction, decreased strength , tendernss to scapula musculature and has 48% for NDI indicating moderate disability.    Pt will benefit from skilled therapeutic intervention in order to improve on the following deficits Impaired flexibility;Decreased range of motion;Decreased strength;Pain;Impaired sensation;Postural dysfunction   Rehab Potential Fair   PT Frequency 2x / week   PT Duration 8 weeks   PT Treatment/Interventions Moist Heat;Electrical Stimulation;Cryotherapy;Ultrasound;Therapeutic activities;Therapeutic exercise;Manual techniques   PT Home Exercise Plan chin tucks, scopula retraction   Consulted and Agree with Plan of Care Patient          G-Codes - 2014/12/24 1457    Functional Assessment Tool Used NDI   Functional Limitation Carrying, moving and handling  objects   Carrying, Moving and Handling Objects Current Status (Z6109) At least 40 percent but less than 60 percent impaired, limited or restricted   Carrying, Moving and  Handling Objects Goal Status [phone]) At least 20 percent but less than 40 percent impaired, limited or restricted       Problem List There are no active problems to display for this patient.   Ezekiel Ina 12/04/2014, 2:58 PM  [facility] MAIN [facility] SERVICES [address] [place], [place], [address] Phone: [phone]   Fax:  [phone]  Name: [name] MRN: [id number] Date of Birth: 03-04-1945

## 2014-12-06 ENCOUNTER — Ambulatory Visit: Payer: Medicare Other | Admitting: Physical Therapy

## 2014-12-06 ENCOUNTER — Encounter: Payer: Self-pay | Admitting: Physical Therapy

## 2014-12-06 DIAGNOSIS — M542 Cervicalgia: Secondary | ICD-10-CM | POA: Diagnosis not present

## 2014-12-06 DIAGNOSIS — R531 Weakness: Secondary | ICD-10-CM | POA: Diagnosis not present

## 2014-12-06 DIAGNOSIS — R200 Anesthesia of skin: Secondary | ICD-10-CM

## 2014-12-06 DIAGNOSIS — R29898 Other symptoms and signs involving the musculoskeletal system: Secondary | ICD-10-CM | POA: Diagnosis not present

## 2014-12-06 NOTE — Therapy (Signed)
[facility] [address] [place], [place], [address] Phone: [phone]   Fax:  [phone]  Physical Therapy Treatment  Patient Details  Name: [name] InfanteMary N [name] MRN: [id number] Date of Birth: 02-14-1946 Referring Provider: Dr. Earna Coderrown  Encounter Date: 12/06/2014      PT End of Session - 12/06/14 1423    Visit Number 2   Number of Visits 17   Date for PT Re-Evaluation 01/29/15   PT Start Time 0215   PT Stop Time 0300   PT Time Calculation (min) 45 min   Activity Tolerance Patient tolerated treatment well;Patient limited by pain   Behavior During Therapy [facility] for tasks assessed/performed      Past Medical History  Diagnosis Date  . Hypertension   . Diabetes mellitus     oral medications  . Anemia     as young child  . Blood transfusion     1964  . Carpal tunnel syndrome   . Arthritis     back, leg and foot    Past Surgical History  Procedure Laterality Date  . Abdominal hysterectomy    . Knee arthroplasty      right  . Appendectomy    . Anterior cervical decomp/discectomy fusion  04/27/2011    Procedure: ANTERIOR CERVICAL DECOMPRESSION/DISCECTOMY FUSION 2 LEVELS;  Surgeon: Mariam DollarGary P Cram, MD;  [facility];  Service: Neurosurgery;  Laterality: N/A;  Anterior Cervical Decompression/ Discectomy Fusion Cervical Three-Four, Cervical Four-Five  . Peripheral vascular catheterization Left 10/29/2014    Procedure: Lower Extremity Angiography;  Surgeon: Annice NeedyJason S Dew, MD;  Location: [facility] INVASIVE CV LAB;  Service: Cardiovascular;  Laterality: Left;  . Peripheral vascular catheterization Left 10/29/2014    Procedure: Lower Extremity Intervention;  Surgeon: Annice NeedyJason S Dew, MD;  Location: [facility] INVASIVE CV LAB;  Service: Cardiovascular;  Laterality: Left;    There were no vitals filed for this visit.  Visit Diagnosis:  Weakness  Neck pain  Numbness      Subjective Assessment - 12/06/14 1421    Subjective Patient is  having 8/10 neck and right shoulder pain.   Currently in Pain? Yes   Pain Score 8    Pain Location Shoulder   Pain Orientation Right   Pain Descriptors / Indicators Aching   Pain Onset 1 to 4 weeks ago       Cervical mechanical traction x 20 minutes 20 sec hold, 10 sec release with 22 lbs.  E-stim  IFC 17. 5 volts to neck and right arm with MH x 20 minutes with some decrease in pain that is mild.  Reviewed chin tucks and HEP.                           PT Education - 12/06/14 1422    Education provided Yes   Education Details HEP   Person(s) Educated Patient   Methods Explanation   Comprehension Verbalized understanding             PT Long Term Goals - 12/04/14 1454    PT LONG TERM GOAL #1   Title Patient will reduce modified Oswestry score to <20 as to demonstrate minimal disability with ADLs including improved sleeping tolerance, walking/sitting tolerance etc for better mobility with ADLs  01/29/15   PT LONG TERM GOAL #2   Title Patient will improve AROM UE so they are able to perform overhead ADL's such as reaching into cabinets.  01/29/15   PT LONG TERM GOAL #3   Title Patient will be able to perform household work/ chores without increase in symptoms. 01/29/15   PT LONG TERM GOAL #4   Title Patient will report a worst pain of 3/10 on VAS in neck and shoulder     to improve tolerance with ADLs and reduced symptoms with activities  01/29/15               Plan - 12/06/14 1423    Clinical Impression Statement Patient continues to have 8/10 neck and right shoulder pain that is decreased mildly with traction to cervical spine. She has mminimal relief after traction and e-stim IFC.    Pt will benefit from skilled therapeutic intervention in order to improve on the following deficits Impaired flexibility;Decreased range of motion;Decreased strength;Pain;Impaired sensation;Postural dysfunction   Rehab Potential Fair   PT Frequency 2x / week   PT  Duration 8 weeks   PT Treatment/Interventions Moist Heat;Electrical Stimulation;Cryotherapy;Ultrasound;Therapeutic activities;Therapeutic exercise;Manual techniques   PT Home Exercise Plan chin tucks, scopula retraction   Consulted and Agree with Plan of Care Patient        Problem List There are no active problems to display for this patient.   Ezekiel Ina 12/06/2014, 2:41 PM  [facility] MAIN [facility] SERVICES [address] [place], [place], [address] Phone: [phone]   Fax:  [phone]  Name: [name] MRN: [id number] Date of Birth: 26-May-1945

## 2014-12-11 ENCOUNTER — Ambulatory Visit: Payer: Medicare Other | Admitting: Physical Therapy

## 2014-12-13 ENCOUNTER — Ambulatory Visit: Payer: Medicare Other | Admitting: Physical Therapy

## 2014-12-18 ENCOUNTER — Encounter: Payer: Self-pay | Admitting: Physical Therapy

## 2014-12-18 ENCOUNTER — Ambulatory Visit: Payer: Medicare Other | Attending: Neurosurgery | Admitting: Physical Therapy

## 2014-12-18 DIAGNOSIS — R531 Weakness: Secondary | ICD-10-CM | POA: Diagnosis not present

## 2014-12-18 DIAGNOSIS — R29898 Other symptoms and signs involving the musculoskeletal system: Secondary | ICD-10-CM | POA: Diagnosis not present

## 2014-12-18 DIAGNOSIS — R200 Anesthesia of skin: Secondary | ICD-10-CM | POA: Insufficient documentation

## 2014-12-18 DIAGNOSIS — M542 Cervicalgia: Secondary | ICD-10-CM | POA: Insufficient documentation

## 2014-12-18 NOTE — Therapy (Signed)
[facility] MAIN [facility] SERVICES [address] [place], [place], [address] Phone: [phone]   Fax:  [phone]  Physical Therapy Treatment  Patient Details  Name: ANNALAYA WILE MRN: [id number] Date of Birth: 1945/12/23 Referring Provider: Dr. Earna Coder  Encounter Date: 12/18/2014      PT End of Session - 12/18/14 1636    Visit Number 3   Number of Visits 17   Date for PT Re-Evaluation 01/29/15   PT Start Time 0430   PT Stop Time 0500   PT Time Calculation (min) 30 min   Activity Tolerance Patient tolerated treatment well;Patient limited by pain   Behavior During Therapy [facility] for tasks assessed/performed      Past Medical History  Diagnosis Date  . Hypertension   . Diabetes mellitus     oral medications  . Anemia     as young child  . Blood transfusion     1964  . Carpal tunnel syndrome   . Arthritis     back, leg and foot    Past Surgical History  Procedure Laterality Date  . Abdominal hysterectomy    . Knee arthroplasty      right  . Appendectomy    . Anterior cervical decomp/discectomy fusion  04/27/2011    Procedure: ANTERIOR CERVICAL DECOMPRESSION/DISCECTOMY FUSION 2 LEVELS;  Surgeon: Mariam Dollar, MD;  [facility]: [facility];  Service: Neurosurgery;  Laterality: N/A;  Anterior Cervical Decompression/ Discectomy Fusion Cervical Three-Four, Cervical Four-Five  . Peripheral vascular catheterization Left 10/29/2014    Procedure: Lower Extremity Angiography;  Surgeon: Annice Needy, MD;  Location: [facility] INVASIVE CV LAB;  Service: Cardiovascular;  Laterality: Left;  . Peripheral vascular catheterization Left 10/29/2014    Procedure: Lower Extremity Intervention;  Surgeon: Annice Needy, MD;  Location: [facility] INVASIVE CV LAB;  Service: Cardiovascular;  Laterality: Left;    There were no vitals filed for this visit.  Visit Diagnosis:  Weakness  Neck pain  Numbness  Decreased ROM of neck      Subjective Assessment - 12/18/14 1635    Subjective Patient is having 0/10 neck and right shoulder pain.   Pain Score 0-No pain   Pain Onset 1 to 4 weeks ago       Therapeutic exericse: Scapula retraction with RTB x 20 x 2, horizontal abd, diagonals UE with RTB x 10 Chin tucks x 10 AROM to neck x 10  E-stim and MH to neck IFC x 20 mintues Good tolerance with no increased pain to neck with exercises.                           PT Education - 12/18/14 1635    Education provided Yes   Education Details HEP   Person(s) Educated Patient   Methods Explanation   Comprehension Verbalized understanding             PT Long Term Goals - 12/04/14 1454    PT LONG TERM GOAL #1   Title Patient will reduce modified Oswestry score to <20 as to demonstrate minimal disability with ADLs including improved sleeping tolerance, walking/sitting tolerance etc for better mobility with ADLs  01/29/15   PT LONG TERM GOAL #2   Title Patient will improve AROM UE so they are able to perform overhead ADL's such as reaching into cabinets.  01/29/15   PT LONG TERM GOAL #3   Title Patient will be able to perform household  work/ chores without increase in symptoms. 01/29/15   PT LONG TERM GOAL #4   Title Patient will report a worst pain of 3/10 on VAS in neck and shoulder     to improve tolerance with ADLs and reduced symptoms with activities  01/29/15               Plan - 12/18/14 1636    Clinical Impression Statement Patient is able to perform RROM with RTB for scapula retraction and AROM to neck without increased pain symptoms.    Pt will benefit from skilled therapeutic intervention in order to improve on the following deficits Impaired flexibility;Decreased range of motion;Decreased strength;Pain;Impaired sensation;Postural dysfunction   Rehab Potential Fair   PT Frequency 2x / week   PT Duration 8 weeks   PT Treatment/Interventions Moist Heat;Electrical Stimulation;Cryotherapy;Ultrasound;Therapeutic  activities;Therapeutic exercise;Manual techniques   PT Home Exercise Plan chin tucks, scopula retraction   Consulted and Agree with Plan of Care Patient        Problem List There are no active problems to display for this patient.   [name], Kristine S 12/18/2014, 4:38 PM  [place] [facility] [address] [place], [place], [address] Phone: [phone]   Fax:  [phone]  Name: Rolena InfanteMary N Spengler MRN: [id number] Date of Birth: [birth year]

## 2014-12-20 ENCOUNTER — Encounter: Payer: Self-pay | Admitting: Physical Therapy

## 2014-12-20 ENCOUNTER — Ambulatory Visit: Payer: Medicare Other | Admitting: Physical Therapy

## 2014-12-20 DIAGNOSIS — R531 Weakness: Secondary | ICD-10-CM

## 2014-12-20 DIAGNOSIS — M542 Cervicalgia: Secondary | ICD-10-CM | POA: Diagnosis not present

## 2014-12-20 DIAGNOSIS — R29898 Other symptoms and signs involving the musculoskeletal system: Secondary | ICD-10-CM

## 2014-12-20 DIAGNOSIS — R200 Anesthesia of skin: Secondary | ICD-10-CM

## 2014-12-20 NOTE — Therapy (Addendum)
[facility] [address] [place], [place], [address] Phone: [phone]   Fax:  [phone]  Physical Therapy Treatment  Patient Details  Name: [name] InfanteMary N Wrisley MRN: [id number] Date of Birth: 1945/02/22 Referring Provider: Dr. Earna Coderrown  Encounter Date: 12/20/2014    Past Medical History  Diagnosis Date  . Hypertension   . Diabetes mellitus     oral medications  . Anemia     as young child  . Blood transfusion     1964  . Carpal tunnel syndrome   . Arthritis     back, leg and foot    Past Surgical History  Procedure Laterality Date  . Abdominal hysterectomy    . Knee arthroplasty      right  . Appendectomy    . Anterior cervical decomp/discectomy fusion  04/27/2011    Procedure: ANTERIOR CERVICAL DECOMPRESSION/DISCECTOMY FUSION 2 LEVELS;  Surgeon: Mariam DollarGary P Cram, MD;  [facility];  Service: Neurosurgery;  Laterality: N/A;  Anterior Cervical Decompression/ Discectomy Fusion Cervical Three-Four, Cervical Four-Five  . Peripheral vascular catheterization Left 10/29/2014    Procedure: Lower Extremity Angiography;  Surgeon: Annice NeedyJason S Dew, MD;  Location: [facility] INVASIVE CV LAB;  Service: Cardiovascular;  Laterality: Left;  . Peripheral vascular catheterization Left 10/29/2014    Procedure: Lower Extremity Intervention;  Surgeon: Annice NeedyJason S Dew, MD;  Location: [facility] INVASIVE CV LAB;  Service: Cardiovascular;  Laterality: Left;    There were no vitals filed for this visit.  Visit Diagnosis:  Weakness  Neck pain  Numbness  Decreased ROM of neck    E-stim IFC to neck and upper thoracic spine x 20 minutes Cervical traction x 20 minutes 20 lbs with 10 sec hold Review of HEP.                                 PT Long Term Goals - 12/04/14 1454    PT LONG TERM GOAL #1   Title Patient will reduce modified Oswestry score to <20 as to demonstrate minimal disability with ADLs including improved sleeping  tolerance, walking/sitting tolerance etc for better mobility with ADLs  01/29/15   PT LONG TERM GOAL #2   Title Patient will improve AROM UE so they are able to perform overhead ADL's such as reaching into cabinets.  01/29/15   PT LONG TERM GOAL #3   Title Patient will be able to perform household work/ chores without increase in symptoms. 01/29/15   PT LONG TERM GOAL #4   Title Patient will report a worst pain of 3/10 on VAS in neck and shoulder     to improve tolerance with ADLs and reduced symptoms with activities  01/29/15               Problem List There are no active problems to display for this patient.   [name], [name] 12/24/2014, 8:50 AM  [place] [facility] [address] [place], [place], [address] Phone: [phone]   Fax:  [phone]  Name: [name] InfanteMary N Zillmer MRN: [id number] Date of Birth: 1945/02/22

## 2014-12-27 ENCOUNTER — Ambulatory Visit: Payer: Medicare Other | Admitting: Physical Therapy

## 2015-01-07 ENCOUNTER — Ambulatory Visit: Payer: Medicare Other | Admitting: Physical Therapy

## 2015-01-09 ENCOUNTER — Ambulatory Visit: Payer: Medicare Other

## 2015-01-09 VITALS — BP 135/51 | HR 80

## 2015-01-09 DIAGNOSIS — R531 Weakness: Secondary | ICD-10-CM

## 2015-01-09 DIAGNOSIS — R29898 Other symptoms and signs involving the musculoskeletal system: Secondary | ICD-10-CM | POA: Diagnosis not present

## 2015-01-09 DIAGNOSIS — M542 Cervicalgia: Secondary | ICD-10-CM

## 2015-01-09 DIAGNOSIS — R200 Anesthesia of skin: Secondary | ICD-10-CM

## 2015-01-09 NOTE — Therapy (Signed)
[facility] MAIN [facility] SERVICES [address] [place], [place], [address] Phone: [phone]   Fax:  [phone]  Physical Therapy Treatment  Patient Details  Name: [name] MRN: [id number] Date of Birth: Dec 21, 1945 Referring Provider: Dr. Earna Coder  Encounter Date: 01/09/2015      PT End of Session - 01/09/15 1553    Visit Number 5   Number of Visits 17   Date for PT Re-Evaluation 01/29/15   PT Start Time 1530   PT Stop Time 1600   PT Time Calculation (min) 30 min   Activity Tolerance Patient tolerated treatment well;Patient limited by pain   Behavior During Therapy [facility] for tasks assessed/performed      Past Medical History  Diagnosis Date  . Hypertension   . Diabetes mellitus     oral medications  . Anemia     as young child  . Blood transfusion     1964  . Carpal tunnel syndrome   . Arthritis     back, leg and foot    Past Surgical History  Procedure Laterality Date  . Abdominal hysterectomy    . Knee arthroplasty      right  . Appendectomy    . Anterior cervical decomp/discectomy fusion  04/27/2011    Procedure: ANTERIOR CERVICAL DECOMPRESSION/DISCECTOMY FUSION 2 LEVELS;  Surgeon: Mariam Dollar, MD;  [facility];  Service: Neurosurgery;  Laterality: N/A;  Anterior Cervical Decompression/ Discectomy Fusion Cervical Three-Four, Cervical Four-Five  . Peripheral vascular catheterization Left 10/29/2014    Procedure: Lower Extremity Angiography;  Surgeon: Annice Needy, MD;  Location: [facility]VAS[facility] CV LAB;  Service: Cardiovascular;  Laterality: Left;  . Peripheral vascular catheterization Left 10/29/2014    Procedure: Lower Extremity Intervention;  Surgeon: Annice Needy, MD;  Location: [facility] INV[facility] LAB;  Service: Cardiovascular;  Laterality: Left;    Filed Vitals:   01/09/15 1531  BP: 135/51  Pulse: 80  SpO2: 99%    Visit Diagnosis:  Weakness  Neck pain  Numbness      Subjective Assessment - 01/09/15 1530     Subjective Patient is having 7/10 neck and right shoulder pain and 8/10 L knee pain. Pt reports she is unsure if she can participate in therapy today. However she would like heat and e-stim for pain management.    Currently in Pain? Yes   Pain Score 8   7/10 R sided neck pain   Pain Location Knee   Pain Orientation Left   Pain Onset 1 to 4 weeks ago      Treatment:  E-stim IFC to neck and upper thoracic spine x 20 minutes with MHP. Tolerance to heat and intensity assessed. Increased from 18V to 20V halfway through time; R posterior cervical STM of paraspinals/extensors as well as upper trap x 10 minutes; Review of HEP and emphasized importance.                           PT Education - 01/09/15 1552    Education provided Yes   Education Details HEP reinforced   Person(s) Educated Patient   Methods Explanation   Comprehension Verbalized understanding             PT Long Term Goals - 12/04/14 1454    PT LONG TERM GOAL #1   Title Patient will reduce modified Oswestry score to <20 as to demonstrate minimal disability with ADLs including improved sleeping tolerance,  walking/sitting tolerance etc for better mobility with ADLs  01/29/15   PT LONG TERM GOAL #2   Title Patient will improve AROM UE so they are able to perform overhead ADL's such as reaching into cabinets.  01/29/15   PT LONG TERM GOAL #3   Title Patient will be able to perform household work/ chores without increase in symptoms. 01/29/15   PT LONG TERM GOAL #4   Title Patient will report a worst pain of 3/10 on VAS in neck and shoulder     to improve tolerance with ADLs and reduced symptoms with activities  01/29/15               Plan - 01/09/15 1553    Clinical Impression Statement Pt is limited today due to high reports of pain. She does however report decreased neck pain following e-stim, heat, and manual therapy. Reinforced importance of HEP and pt encouraged to follow-up as  scheduled.    Pt will benefit from skilled therapeutic intervention in order to improve on the following deficits Impaired flexibility;Decreased range of motion;Decreased strength;Pain;Impaired sensation;Postural dysfunction   Rehab Potential Fair   PT Frequency 2x / week   PT Duration 8 weeks   PT Treatment/Interventions Moist Heat;Electrical Stimulation;Cryotherapy;Ultrasound;Therapeutic activities;Therapeutic exercise;Manual techniques   PT Next Visit Plan Progress manual therapy as well as ther-ex   PT Home Exercise Plan chin tucks, scapular retraction   Consulted and Agree with Plan of Care Patient        Problem List There are no active problems to display for this patient.  Lynnea MaizesJason D Kristalyn Bergstresser PT, DPT   Rimsha Trembley 01/09/2015, 4:19 PM  [place] [facility] MAIN [facility] [address] [place], [place], [address] Phone: [phone]   Fax:  [phone]  Name: [name] MRN: [id number] Date of Birth: 1945-10-31

## 2015-01-15 ENCOUNTER — Encounter: Payer: Self-pay | Admitting: Physical Therapy

## 2015-01-15 ENCOUNTER — Ambulatory Visit: Payer: Medicare Other | Admitting: Physical Therapy

## 2015-01-15 DIAGNOSIS — R200 Anesthesia of skin: Secondary | ICD-10-CM

## 2015-01-15 DIAGNOSIS — M542 Cervicalgia: Secondary | ICD-10-CM

## 2015-01-15 DIAGNOSIS — R29898 Other symptoms and signs involving the musculoskeletal system: Secondary | ICD-10-CM | POA: Diagnosis not present

## 2015-01-15 DIAGNOSIS — R531 Weakness: Secondary | ICD-10-CM

## 2015-01-15 NOTE — Therapy (Signed)
[facility] [address] [place], [place], [address] Phone: [phone]   Fax:  [phone]  Physical Therapy Treatment  Patient Details  Name: [name] MRN: [id number] Date of Birth: 02-23-1945 Referring Provider: Dr. Earna Coderrown  Encounter Date: 01/15/2015      PT End of Session - 01/15/15 1555    Visit Number 6   Number of Visits 17   Date for PT Re-Evaluation 01/29/15   PT Start Time 0320   PT Stop Time 0400   PT Time Calculation (min) 40 min   Activity Tolerance Patient tolerated treatment well;Patient limited by pain   Behavior During Therapy [facility] for tasks assessed/performed      Past Medical History  Diagnosis Date  . Hypertension   . Diabetes mellitus     oral medications  . Anemia     as young child  . Blood transfusion     1964  . Carpal tunnel syndrome   . Arthritis     back, leg and foot    Past Surgical History  Procedure Laterality Date  . Abdominal hysterectomy    . Knee arthroplasty      right  . Appendectomy    . Anterior cervical decomp/discectomy fusion  04/27/2011    Procedure: ANTERIOR CERVICAL DECOMPRESSION/DISCECTOMY FUSION 2 LEVELS;  Surgeon: Mariam DollarGary P Cram, MD;  [facility]: [facility];  Service: Neurosurgery;  Laterality: N/A;  Anterior Cervical Decompression/ Discectomy Fusion Cervical Three-Four, Cervical Four-Five  . Peripheral vascular catheterization Left 10/29/2014    Procedure: Lower Extremity Angiography;  Surgeon: Annice NeedyJason S Dew, MD;  Location: [facility] INVASIVE CV LAB;  Service: Cardiovascular;  Laterality: Left;  . Peripheral vascular catheterization Left 10/29/2014    Procedure: Lower Extremity Intervention;  Surgeon: Annice NeedyJason S Dew, MD;  Location: [facility] INVASIVE CV LAB;  Service: Cardiovascular;  Laterality: Left;    There were no vitals filed for this visit.  Visit Diagnosis:  Weakness  Neck pain  Numbness  Decreased ROM of neck      Subjective Assessment - 01/15/15 1554     Subjective Patient is having 4/10 pain to right side of neck and it is constant.    Pain Score 4    Pain Location Neck   Pain Orientation Right   Pain Descriptors / Indicators Aching   Pain Type Chronic pain   Pain Onset 1 to 4 weeks ago         E-stim with IFC crossed pattern, US to right neck at 1.5 cm squared x 15 mintues. Manual therapy to upper trap STM with relief.  Reviewed HEP.                           PT Education - 01/15/15 1555    Education provided Yes   Education Details HEP   Person(s) Educated Patient   Methods Explanation   Comprehension Verbalized understanding             PT Long Term Goals - 12/04/14 1454    PT LONG TERM GOAL #1   Title Patient will reduce modified Oswestry score to <20 as to demonstrate minimal disability with ADLs including improved sleeping tolerance, walking/sitting tolerance etc for better mobility with ADLs  01/29/15   PT LONG TERM GOAL #2   Title Patient will improve AROM UE so they are able to perform overhead ADL's such as reaching into cabinets.  01/29/15   PT LONG TERM  GOAL #3   Title Patient will be able to perform household work/ chores without increase in symptoms. 01/29/15   PT LONG TERM GOAL #4   Title Patient will report a worst pain of 3/10 on VAS in neck and shoulder     to improve tolerance with ADLs and reduced symptoms with activities  01/29/15               Plan - 01/15/15 1556    Clinical Impression Statement Patient has 4/10 pain before therapy and is limited by pain.    Pt will benefit from skilled therapeutic intervention in order to improve on the following deficits Impaired flexibility;Decreased range of motion;Decreased strength;Pain;Impaired sensation;Postural dysfunction   Rehab Potential Fair   PT Frequency 2x / week   PT Duration 8 weeks   PT Treatment/Interventions Moist Heat;Electrical Stimulation;Cryotherapy;Ultrasound;Therapeutic activities;Therapeutic  exercise;Manual techniques   PT Next Visit Plan Progress manual therapy as well as ther-ex   PT Home Exercise Plan chin tucks, scapular retraction   Consulted and Agree with Plan of Care Patient        Problem List There are no active problems to display for this patient.   Ezekiel Ina 01/15/2015, 4:03 PM  [place] [facility] MAIN [facility] SERVICES [address] [place], [place], [address] Phone: [phone]   Fax:  [phone]  Name: [name] MRN: [id number] Date of Birth: [birth year]

## 2015-01-17 ENCOUNTER — Encounter: Payer: Self-pay | Admitting: Physical Therapy

## 2015-01-17 ENCOUNTER — Ambulatory Visit: Payer: Medicare Other | Attending: Neurosurgery | Admitting: Physical Therapy

## 2015-01-17 DIAGNOSIS — R531 Weakness: Secondary | ICD-10-CM

## 2015-01-17 DIAGNOSIS — R29898 Other symptoms and signs involving the musculoskeletal system: Secondary | ICD-10-CM | POA: Diagnosis not present

## 2015-01-17 DIAGNOSIS — M542 Cervicalgia: Secondary | ICD-10-CM | POA: Insufficient documentation

## 2015-01-17 DIAGNOSIS — R200 Anesthesia of skin: Secondary | ICD-10-CM | POA: Diagnosis not present

## 2015-01-17 NOTE — Therapy (Signed)
[facility] [address] [place], [place], [address] Phone: [phone]   Fax:  [phone]  Physical Therapy Treatment  Patient Details  Name: Rolena InfanteMary N Tartaglia MRN: [id number] Date of Birth: [birth year] Referring Provider: Dr. Earna Coderrown  Encounter Date: 01/17/2015      PT End of Session - 01/17/15 1545    Visit Number 7   Number of Visits 17   Date for PT Re-Evaluation 01/29/15   PT Start Time 0316   PT Stop Time 0400   PT Time Calculation (min) 44 min   Activity Tolerance Patient tolerated treatment well;Patient limited by pain   Behavior During Therapy [facility] for tasks assessed/performed      Past Medical History  Diagnosis Date  . Hypertension   . Diabetes mellitus     oral medications  . Anemia     as young child  . Blood transfusion     1964  . Carpal tunnel syndrome   . Arthritis     back, leg and foot    Past Surgical History  Procedure Laterality Date  . Abdominal hysterectomy    . Knee arthroplasty      right  . Appendectomy    . Anterior cervical decomp/discectomy fusion  04/27/2011    Procedure: ANTERIOR CERVICAL DECOMPRESSION/DISCECTOMY FUSION 2 LEVELS;  Surgeon: Mariam DollarGary P Cram, MD;  Location: [facility];  Service: Neurosurgery;  Laterality: N/A;  Anterior Cervical Decompression/ Discectomy Fusion Cervical Three-Four, Cervical Four-Five  . Peripheral vascular catheterization Left 10/29/2014    Procedure: Lower Extremity Angiography;  Surgeon: Annice NeedyJason S Dew, MD;  Location: [facility] INVASIVE CV LAB;  Service: Cardiovascular;  Laterality: Left;  . Peripheral vascular catheterization Left 10/29/2014    Procedure: Lower Extremity Intervention;  Surgeon: Annice NeedyJason S Dew, MD;  Location: [facility] INVASIVE CV LAB;  Service: Cardiovascular;  Laterality: Left;    There were no vitals filed for this visit.  Visit Diagnosis:  Weakness  Neck pain  Numbness  Decreased ROM of neck      Subjective Assessment - 01/17/15 1543    Subjective Patient is having 8/10 pain to right side of neck and it is constant.    Currently in Pain? Yes   Pain Score 8    Pain Onset 1 to 4 weeks ago      Patient is seen for US to right neck at 1.5 cm squared x 15 mins E-stim  IFC to crossed pattern x 20 minutes, and MH to neck.  Patient reports decreased pain after treatment 3/10 to neck.                           PT Education - 01/17/15 1545    Education provided Yes   Education Details HEP   Person(s) Educated Patient   Methods Explanation   Comprehension Verbalized understanding             PT Long Term Goals - 12/04/14 1454    PT LONG TERM GOAL #1   Title Patient will reduce modified Oswestry score to <20 as to demonstrate minimal disability with ADLs including improved sleeping tolerance, walking/sitting tolerance etc for better mobility with ADLs  01/29/15   PT LONG TERM GOAL #2   Title Patient will improve AROM UE so they are able to perform overhead ADL's such as reaching into cabinets.  01/29/15   PT LONG TERM GOAL #3   Title Patient will be able to  perform household work/ chores without increase in symptoms. 01/29/15   PT LONG TERM GOAL #4   Title Patient will report a worst pain of 3/10 on VAS in neck and shoulder     to improve tolerance with ADLs and reduced symptoms with activities  01/29/15               Plan - 01/17/15 1546    Clinical Impression Statement Patient is having higher pain today 81/0 and is responding to heat and [place] and e-stim to decrease her pain in her neck.    Pt will benefit from skilled therapeutic intervention in order to improve on the following deficits Impaired flexibility;Decreased range of motion;Decreased strength;Pain;Impaired sensation;Postural dysfunction   Rehab Potential Fair   PT Frequency 2x / week   PT Duration 8 weeks   PT Treatment/Interventions Moist Heat;Electrical Stimulation;Cryotherapy;Ultrasound;Therapeutic activities;Therapeutic  exercise;Manual techniques   PT Next Visit Plan Progress manual therapy as well as ther-ex   PT Home Exercise Plan chin tucks, scapular retraction   Consulted and Agree with Plan of Care Patient        Problem List There are no active problems to display for this patient.   Ezekiel Ina 01/17/2015, 4:02 PM  [place] [facility] MAIN [facility] SERVICES [address] [place], [place], [address] Phone: [phone]   Fax:  [phone]  Name: POLLYANNA LEVAY MRN: [id number] Date of Birth: 1945/08/04

## 2015-01-22 ENCOUNTER — Ambulatory Visit: Payer: Medicare Other | Admitting: Physical Therapy

## 2015-01-24 ENCOUNTER — Ambulatory Visit: Payer: Medicare Other | Admitting: Physical Therapy

## 2015-01-24 ENCOUNTER — Encounter: Payer: Self-pay | Admitting: Physical Therapy

## 2015-01-24 DIAGNOSIS — R29898 Other symptoms and signs involving the musculoskeletal system: Secondary | ICD-10-CM

## 2015-01-24 DIAGNOSIS — R200 Anesthesia of skin: Secondary | ICD-10-CM

## 2015-01-24 DIAGNOSIS — M542 Cervicalgia: Secondary | ICD-10-CM | POA: Diagnosis not present

## 2015-01-24 DIAGNOSIS — R531 Weakness: Secondary | ICD-10-CM | POA: Diagnosis not present

## 2015-01-24 NOTE — Therapy (Signed)
[place] [facility] [address] [place], [place], [address] Phone: [phone]   Fax:  [phone]  Physical Therapy Treatment  Patient Details  Name: Rolena InfanteMary N Krise MRN: [id number] Date of Birth: 30-Apr-1945 Referring Provider: Dr. Earna Coderrown  Encounter Date: 01/24/2015      PT End of Session - 01/24/15 1532    Visit Number (p) 8   Number of Visits (p) 17   Date for PT Re-Evaluation (p) 01/29/15   PT Start Time (p) 0315   PT Stop Time (p) 0400   PT Time Calculation (min) (p) 45 min   Activity Tolerance (p) Patient tolerated treatment well;Patient limited by pain   Behavior During Therapy (p) WFL for tasks assessed/performed      Past Medical History  Diagnosis Date  . Hypertension   . Diabetes mellitus     oral medications  . Anemia     as young child  . Blood transfusion     1964  . Carpal tunnel syndrome   . Arthritis     back, leg and foot    Past Surgical History  Procedure Laterality Date  . Abdominal hysterectomy    . Knee arthroplasty      right  . Appendectomy    . Anterior cervical decomp/discectomy fusion  04/27/2011    Procedure: ANTERIOR CERVICAL DECOMPRESSION/DISCECTOMY FUSION 2 LEVELS;  Surgeon: Mariam DollarGary P Cram, MD;  [facility]: [facility];  Service: Neurosurgery;  Laterality: N/A;  Anterior Cervical Decompression/ Discectomy Fusion Cervical Three-Four, Cervical Four-Five  . Peripheral vascular catheterization Left 10/29/2014    Procedure: Lower Extremity Angiography;  Surgeon: Annice NeedyJason S Dew, MD;  Location: [facility] INVASIVE CV LAB;  Service: Cardiovascular;  Laterality: Left;  . Peripheral vascular catheterization Left 10/29/2014    Procedure: Lower Extremity Intervention;  Surgeon: Annice NeedyJason S Dew, MD;  Location: [facility] INVASIVE CV LAB;  Service: Cardiovascular;  Laterality: Left;    There were no vitals filed for this visit.  Visit Diagnosis:  Weakness  Neck pain  Numbness  Decreased ROM of neck       Subjective Assessment - 01/24/15 1529    Subjective Patient is having 5/10 pain to right side of neck and it is constant. She has intermittent pain that changes in intensity.    Pain Onset 1 to 4 weeks ago      Patient seen for review of HEP followed by e-stim IFC crossed pattern to neck and US 1.5 cm squared x 15 minutes to neck. Patient has decreased pain 2/10 following therapy.                            PT Education - 01/24/15 1530    Education Details HEP   Person(s) Educated Patient   Methods Explanation   Comprehension Verbalized understanding             PT Long Term Goals - 12/04/14 1454    PT LONG TERM GOAL #1   Title Patient will reduce modified Oswestry score to <20 as to demonstrate minimal disability with ADLs including improved sleeping tolerance, walking/sitting tolerance etc for better mobility with ADLs  01/29/15   PT LONG TERM GOAL #2   Title Patient will improve AROM UE so they are able to perform overhead ADL's such as reaching into cabinets.  01/29/15   PT LONG TERM GOAL #3   Title Patient will be able to perform household work/ chores without increase  in symptoms. 01/29/15   PT LONG TERM GOAL #4   Title Patient will report a worst pain of 3/10 on VAS in neck and shoulder     to improve tolerance with ADLs and reduced symptoms with activities  01/29/15               Problem List There are no active problems to display for this patient.   Ezekiel Ina 01/24/2015, 4:03 PM  [facility] MAIN [facility] SERVICES [address] [place], [place], [address] Phone: [phone]   Fax:  [phone]  Name: CHALLIS CRILL MRN: [id number] Date of Birth: Oct 24, 1945

## 2015-01-25 ENCOUNTER — Encounter: Payer: Medicare Other | Admitting: Physical Therapy

## 2015-01-29 ENCOUNTER — Encounter: Payer: Self-pay | Admitting: Physical Therapy

## 2015-01-29 ENCOUNTER — Encounter: Payer: Medicare Other | Admitting: Physical Therapy

## 2015-01-29 ENCOUNTER — Ambulatory Visit: Payer: Medicare Other | Admitting: Physical Therapy

## 2015-01-29 DIAGNOSIS — R29898 Other symptoms and signs involving the musculoskeletal system: Secondary | ICD-10-CM

## 2015-01-29 DIAGNOSIS — R531 Weakness: Secondary | ICD-10-CM | POA: Diagnosis not present

## 2015-01-29 DIAGNOSIS — R200 Anesthesia of skin: Secondary | ICD-10-CM | POA: Diagnosis not present

## 2015-01-29 DIAGNOSIS — M542 Cervicalgia: Secondary | ICD-10-CM | POA: Diagnosis not present

## 2015-01-29 NOTE — Therapy (Signed)
[place] [facility] [address] [place], [place], [address] Phone: [phone]   Fax:  [phone]  Physical Therapy Treatment  Patient Details  Name: [name] MRN: [id number] Date of Birth: 1945-04-02 Referring Provider: Dr. Earna Coderrown  Encounter Date: 01/29/2015      PT End of Session - 01/29/15 1731    Visit Number 9   Number of Visits 17   Date for PT Re-Evaluation 01/29/15   PT Start Time 0500   PT Stop Time 0545   PT Time Calculation (min) 45 min   Activity Tolerance Patient tolerated treatment well;Patient limited by pain   Behavior During Therapy [facility] for tasks assessed/performed      Past Medical History  Diagnosis Date  . Hypertension   . Diabetes mellitus     oral medications  . Anemia     as young child  . Blood transfusion     1964  . Carpal tunnel syndrome   . Arthritis     back, leg and foot    Past Surgical History  Procedure Laterality Date  . Abdominal hysterectomy    . Knee arthroplasty      right  . Appendectomy    . Anterior cervical decomp/discectomy fusion  04/27/2011    Procedure: ANTERIOR CERVICAL DECOMPRESSION/DISCECTOMY FUSION 2 LEVELS;  Surgeon: Mariam DollarGary P Cram, MD;  Location: [facility];  Service: Neurosurgery;  Laterality: N/A;  Anterior Cervical Decompression/ Discectomy Fusion Cervical Three-Four, Cervical Four-Five  . Peripheral vascular catheterization Left 10/29/2014    Procedure: Lower Extremity Angiography;  Surgeon: Annice NeedyJason S Dew, MD;  Location: [facility] INVASIVE CV LAB;  Service: Cardiovascular;  Laterality: Left;  . Peripheral vascular catheterization Left 10/29/2014    Procedure: Lower Extremity Intervention;  Surgeon: Annice NeedyJason S Dew, MD;  Location: [facility] INVASIVE CV LAB;  Service: Cardiovascular;  Laterality: Left;    There were no vitals filed for this visit.  Visit Diagnosis:  Weakness  Neck pain  Numbness  Decreased ROM of neck      Subjective Assessment - 01/29/15 1730     Subjective Patient is having 5210 pain to right side of neck and it is constant. She has intermittent pain that changes in intensity.    Currently in Pain? Yes   Pain Onset 1 to 4 weeks ago      Patient is seen for US to right neck at 1.5 cm squared x 15 mins  E-stim IFC to crossed pattern x 20 minutes, and MH to neck.  Patient reports decreased pain after treatment 0/10 to neck                             PT Education - 01/29/15 1730    Education provided Yes   Education Details HEP   Person(s) Educated Patient   Methods Explanation   Comprehension Verbalized understanding             PT Long Term Goals - 12/04/14 1454    PT LONG TERM GOAL #1   Title Patient will reduce modified Oswestry score to <20 as to demonstrate minimal disability with ADLs including improved sleeping tolerance, walking/sitting tolerance etc for better mobility with ADLs  01/29/15   PT LONG TERM GOAL #2   Title Patient will improve AROM UE so they are able to perform overhead ADL's such as reaching into cabinets.  01/29/15   PT LONG TERM GOAL #3   Title  Patient will be able to perform household work/ chores without increase in symptoms. 01/29/15   PT LONG TERM GOAL #4   Title Patient will report a worst pain of 3/10 on VAS in neck and shoulder     to improve tolerance with ADLs and reduced symptoms with activities  01/29/15               Plan - 01/29/15 1731    Clinical Impression Statement Patient is having less pain today and better movement.    Pt will benefit from skilled therapeutic intervention in order to improve on the following deficits Impaired flexibility;Decreased range of motion;Decreased strength;Pain;Impaired sensation;Postural dysfunction   Rehab Potential Fair   PT Frequency 2x / week   PT Duration 8 weeks   PT Treatment/Interventions Moist Heat;Electrical Stimulation;Cryotherapy;Ultrasound;Therapeutic activities;Therapeutic exercise;Manual techniques    PT Next Visit Plan Progress manual therapy as well as ther-ex   PT Home Exercise Plan chin tucks, scapular retraction   Consulted and Agree with Plan of Care Patient        Problem List There are no active problems to display for this patient.   Ezekiel Ina 01/29/2015, 5:33 PM  [place] [facility] MAIN [facility] SERVICES [address] [place], [place], [address] Phone: [phone]   Fax:  [phone]  Name: [name] MRN: [id number] Date of Birth: [birth year]

## 2015-02-04 ENCOUNTER — Encounter: Payer: Medicare Other | Admitting: Physical Therapy

## 2015-02-06 ENCOUNTER — Encounter: Payer: Medicare Other | Admitting: Physical Therapy

## 2015-02-13 ENCOUNTER — Encounter: Payer: Medicare Other | Admitting: Physical Therapy

## 2015-02-20 ENCOUNTER — Encounter: Payer: Medicare Other | Attending: Surgery | Admitting: Surgery

## 2015-02-20 DIAGNOSIS — I739 Peripheral vascular disease, unspecified: Secondary | ICD-10-CM | POA: Insufficient documentation

## 2015-02-20 DIAGNOSIS — M79605 Pain in left leg: Secondary | ICD-10-CM | POA: Insufficient documentation

## 2015-02-20 DIAGNOSIS — Z880 Allergy status to penicillin: Secondary | ICD-10-CM | POA: Diagnosis not present

## 2015-02-20 DIAGNOSIS — E11622 Type 2 diabetes mellitus with other skin ulcer: Secondary | ICD-10-CM | POA: Diagnosis not present

## 2015-02-20 DIAGNOSIS — I70322 Atherosclerosis of unspecified type of bypass graft(s) of the extremities with rest pain, left leg: Secondary | ICD-10-CM | POA: Insufficient documentation

## 2015-02-20 DIAGNOSIS — I771 Stricture of artery: Secondary | ICD-10-CM | POA: Diagnosis not present

## 2015-02-20 DIAGNOSIS — R600 Localized edema: Secondary | ICD-10-CM | POA: Diagnosis not present

## 2015-02-20 DIAGNOSIS — E559 Vitamin D deficiency, unspecified: Secondary | ICD-10-CM | POA: Diagnosis not present

## 2015-02-20 DIAGNOSIS — E11610 Type 2 diabetes mellitus with diabetic neuropathic arthropathy: Secondary | ICD-10-CM | POA: Insufficient documentation

## 2015-02-20 DIAGNOSIS — G629 Polyneuropathy, unspecified: Secondary | ICD-10-CM | POA: Diagnosis not present

## 2015-02-20 DIAGNOSIS — E1165 Type 2 diabetes mellitus with hyperglycemia: Secondary | ICD-10-CM | POA: Diagnosis not present

## 2015-02-20 DIAGNOSIS — I10 Essential (primary) hypertension: Secondary | ICD-10-CM | POA: Insufficient documentation

## 2015-02-20 DIAGNOSIS — E781 Pure hyperglyceridemia: Secondary | ICD-10-CM | POA: Diagnosis not present

## 2015-02-20 DIAGNOSIS — I70342 Atherosclerosis of unspecified type of bypass graft(s) of the left leg with ulceration of calf: Secondary | ICD-10-CM | POA: Insufficient documentation

## 2015-02-20 DIAGNOSIS — M1990 Unspecified osteoarthritis, unspecified site: Secondary | ICD-10-CM | POA: Diagnosis not present

## 2015-02-21 ENCOUNTER — Other Ambulatory Visit
Admission: RE | Admit: 2015-02-21 | Discharge: 2015-02-21 | Disposition: A | Discharge status: Home / Self Care | Payer: Medicare Other | Source: Ambulatory Visit | Attending: Surgery | Admitting: Surgery

## 2015-02-21 DIAGNOSIS — S81802A Unspecified open wound, left lower leg, initial encounter: Secondary | ICD-10-CM | POA: Diagnosis not present

## 2015-02-21 NOTE — Progress Notes (Signed)
[name], Mirela N. ([id number]) Visit Report for 02/20/2015 Abuse/Suicide Risk Screen Details Patient Name: [name], [name] Date of Service: 02/20/2015 3:30 PM Medical Record Patient Account Number: [id number] Number: Treating RN: Curtis SitesDorthy, Joanna 1945-09-28 (69 y.o. Other Clinician: Date of Birth/Sex: Female) Treating BURNS III, Primary Care Physician: Sherrie MustacheJADALI, FAYEGH Physician/Extender: Zollie BeckersWALTER Referring Physician: Sherrie MustacheJADALI, FAYEGH Weeks in Treatment: 0 Abuse/Suicide Risk Screen Items Answer ABUSE/SUICIDE RISK SCREEN: Has anyone close to you tried to hurt or harm you recentlyo No Do you feel uncomfortable with anyone in your familyo No Has anyone forced you do things that you didnot want to doo No Do you have any thoughts of harming yourselfo No Patient displays signs or symptoms of abuse and/or neglect. No Electronic Signature(s) Signed: 02/20/2015 5:20:05 PM By: Curtis Sitesorthy, Joanna Entered By: Curtis Sitesorthy, Joanna on 02/20/2015 15:36:08 Fessel, [name] ([id number]) -------------------------------------------------------------------------------- Activities of Daily Living Details Patient Name: [name], [name] Date of Service: 02/20/2015 3:30 PM Medical Record Patient Account Number: [id number] Number: Treating RN: Curtis SitesDorthy, Joanna 1945-09-28 (69 y.o. Other Clinician: Date of Birth/Sex: Female) Treating BURNS III, Primary Care Physician: Sherrie MustacheJADALI, FAYEGH Physician/Extender: Zollie BeckersWALTER Referring Physician: Sherrie MustacheJADALI, FAYEGH Weeks in Treatment: 0 Activities of Daily Living Items Answer Activities of Daily Living (Please select one for each item) Drive Automobile Not Able Take Medications Completely Able Use Telephone Completely Able Care for Appearance Completely Able Use Toilet Completely Able Bath / Shower Need Assistance Dress Self Need Assistance Feed Self Completely Able Walk Need Assistance Get In / Out Bed Completely Able Housework Need Assistance Prepare Meals Completely  Able Handle Money Completely Able Shop for Self Need Assistance Electronic Signature(s) Signed: 02/20/2015 5:20:05 PM By: Curtis Sitesorthy, Joanna Entered By: Curtis Sitesorthy, Joanna on 02/20/2015 15:36:37 Sobh, [name] ([id number]) -------------------------------------------------------------------------------- Education Assessment Details Patient Name: [name], [name] Date of Service: 02/20/2015 3:30 PM Medical Record Patient Account Number: [id number] Number: Treating RN: Curtis SitesDorthy, Joanna 1945-09-28 (69 y.o. Other Clinician: Date of Birth/Sex: Female) Treating BURNS III, Primary Care Physician: Sherrie MustacheJADALI, FAYEGH Physician/Extender: Zollie BeckersWALTER Referring Physician: Sherrie MustacheJADALI, FAYEGH Weeks in Treatment: 0 Primary Learner Assessed: Patient Learning Preferences/Education Level/Primary Language Learning Preference: Explanation, Demonstration Highest Education Level: High School Preferred Language: English Cognitive Barrier Assessment/Beliefs Language Barrier: No Translator Needed: No Memory Deficit: No Emotional Barrier: No Cultural/Religious Beliefs Affecting Medical No Care: Physical Barrier Assessment Impaired Vision: No Impaired Hearing: No Decreased Hand dexterity: No Knowledge/Comprehension Assessment Knowledge Level: Medium Comprehension Level: Medium Ability to understand written Medium instructions: Ability to understand verbal Medium instructions: Motivation Assessment Anxiety Level: Calm Cooperation: Cooperative Education Importance: Acknowledges Need Interest in Health Problems: Asks Questions Perception: Coherent Willingness to Engage in Self- Medium Management Activities: Readiness to Engage in Self- Medium Management Activities: [name], [name] ([id number]) Electronic Signature(s) Signed: 02/20/2015 5:20:05 PM By: Curtis Sitesorthy, Joanna Entered By: Curtis Sitesorthy, Joanna on 02/20/2015 15:36:59 [name], [name]  ([id number]) -------------------------------------------------------------------------------- Fall Risk Assessment Details Patient Name: [name], [name] Date of Service: 02/20/2015 3:30 PM Medical Record Patient Account Number: [id number] Number: Treating RN: Curtis SitesDorthy, Joanna 1945-09-28 (69 y.o. Other Clinician: Date of Birth/Sex: Female) Treating BURNS III, Primary Care Physician: Sherrie MustacheJADALI, FAYEGH Physician/Extender: Zollie BeckersWALTER Referring Physician: Sherrie MustacheJADALI, FAYEGH Weeks in Treatment: 0 Fall Risk Assessment Items Have you had 2 or more falls in the last 12 monthso 0 No Have you had any fall that resulted in injury in the last 12 monthso 0 No FALL RISK ASSESSMENT: History of falling - immediate or within 3 months 0 No Secondary diagnosis 0 No Ambulatory aid None/bed rest/wheelchair/nurse 0 No Crutches/cane/walker  15 Yes Furniture 0 No IV Access/Saline Lock 0 No Gait/Training Normal/bed rest/immobile 0 No Weak 10 Yes Impaired 0 No Mental Status Oriented to own ability 0 Yes Electronic Signature(s) Signed: 02/20/2015 5:20:05 PM By: Curtis Sites Entered By: Curtis Sites on 02/20/2015 15:37:13 Slay, [name] ([id number]) -------------------------------------------------------------------------------- Foot Assessment Details Patient Name: [name] [name] Date of Service: 02/20/2015 3:30 PM Medical Record Patient Account Number: [id number] Number: Treating RN: Curtis Sites 01/21/1946 (70 y.o. Other Clinician: Date of Birth/Sex: Female) Treating BURNS III, Primary Care Physician: Sherrie Mustache Physician/Extender: Zollie Beckers Referring Physician: Sherrie Mustache Weeks in Treatment: 0 Foot Assessment Items Site Locations + = Sensation present, - = Sensation absent, C = Callus, U = Ulcer R = Redness, W = Warmth, M = Maceration, PU = Pre-ulcerative lesion F = Fissure, S = Swelling, D = Dryness Assessment Right: Left: Other Deformity: No No Prior Foot Ulcer: No No Prior  Amputation: No No Charcot Joint: No No Ambulatory Status: Ambulatory With Help Assistance Device: Wheelchair Gait: Steady Electronic Signature(s) Signed: 02/20/2015 5:20:05 PM By: Curtis Sites Entered By: Curtis Sites on 02/20/2015 15:37:55 Maul, [name] ([id number] Pulley, [name] ([id number]) -------------------------------------------------------------------------------- Nutrition Risk Assessment Details Patient Name: [name] [name] Date of Service: 02/20/2015 3:30 PM Medical Record Patient Account Number: [id number] Number: Treating RN: Curtis Sites [birth year] (69 y.o. Other Clinician: Date of Birth/Sex: Female) Treating BURNS III, Primary Care Physician: Sherrie Mustache Physician/Extender: Zollie Beckers Referring Physician: Sherrie Mustache Weeks in Treatment: 0 Height (in): 67 Weight (lbs): 302 Body Mass Index (BMI): 47.3 Nutrition Risk Assessment Items NUTRITION RISK SCREEN: I have an illness or condition that made me change the kind and/or 0 No amount of food I eat I eat fewer than two meals per day 0 No I eat few fruits and vegetables, or milk products 0 No I have three or more drinks of beer, liquor or wine almost every day 0 No I have tooth or mouth problems that make it hard for me to eat 0 No I don't always have enough money to buy the food I need 0 No I eat alone most of the time 0 No I take three or more different prescribed or over-the-counter drugs a 1 Yes day Without wanting to, I have lost or gained 10 pounds in the last six 2 Yes months I am not always physically able to shop, cook and/or feed myself 0 No Nutrition Protocols Good Risk Protocol Provide education on Moderate Risk Protocol 0 nutrition Electronic Signature(s) Signed: 02/20/2015 5:20:05 PM By: Curtis Sites Entered By: Curtis Sites on 02/20/2015 15:37:29

## 2015-02-22 DIAGNOSIS — E785 Hyperlipidemia, unspecified: Secondary | ICD-10-CM | POA: Diagnosis not present

## 2015-02-22 DIAGNOSIS — I10 Essential (primary) hypertension: Secondary | ICD-10-CM | POA: Diagnosis not present

## 2015-02-22 DIAGNOSIS — E559 Vitamin D deficiency, unspecified: Secondary | ICD-10-CM | POA: Diagnosis not present

## 2015-02-22 DIAGNOSIS — E1165 Type 2 diabetes mellitus with hyperglycemia: Secondary | ICD-10-CM | POA: Diagnosis not present

## 2015-02-22 NOTE — Progress Notes (Signed)
[name], [name] ([id number]) Visit Report for 02/20/2015 Chief Complaint Document Details Patient Name: [name], [name] Date of Service: 02/20/2015 3:30 PM Medical Record Patient Account Number: [id number] Number: Treating [name]: [name] 1945/09/03 (70 y.o. Other Clinician: Date of Birth/Sex: Female) Treating BURNS [name], Primary Care Physician: [name] Physician/Extender: [name] Referring Physician: Sherrie [name] Weeks in Treatment: 0 Information Obtained from: Patient Chief Complaint Left calf ulcerations. Arterial insufficiency. Electronic Signature(s) Signed: 02/21/2015 11:05:00 AM By: [name] Bhat MD Entered By: [name] on 02/21/2015 10:56:43 [name], [name] Stakes ([id number]) -------------------------------------------------------------------------------- Debridement Details Patient Name: [name] Date of Service: 02/20/2015 3:30 PM Medical Record Patient Account Number: [id number] Number: Treating [name]: [name] September 15, 1945 (70 y.o. Other Clinician: Date of Birth/Sex: Female) Treating BURNS [name], Primary Care Physician: [name] Physician/Extender: [name] Referring Physician: Sherrie [name] Weeks in Treatment: 0 Debridement Performed for Wound #2 Left,Medial Lower Leg Assessment: Performed By: Physician BURNS [name], [name]., MD Debridement: Open Wound/Selective Debridement Selective Description: Pre-procedure Yes Verification/Time Out Taken: Start Time: 16:10 Pain Control: Other : lidocaine 4% Level: Non-Viable Tissue Total Area Debrided (L x 0.8 (cm) x 0.9 (cm) = 0.72 (cm) W): Tissue and other Non-Viable, Eschar material debrided: Instrument: Curette Bleeding: None End Time: 16:14 Procedural Pain: 0 Post Procedural Pain: 0 Response to Treatment: Procedure was tolerated well Post Debridement Measurements of Total Wound Length: (cm) 0.8 Width: (cm) 0.9 Depth: (cm) 0.2 Volume: (cm) 0.113 Post Procedure  Diagnosis Same as Pre-procedure Electronic Signature(s) Signed: 02/21/2015 11:05:00 AM By: [name] Bhat MD Signed: 02/21/2015 5:56:46 PM By: [name] Previous Signature: 02/20/2015 5:28:49 PM Version By: [name] Gurney [name], BSN, [name], BSN Previous Signature: 02/21/2015 8:51:38 AM Version By: [name] Bhat MD [name], [name] ([id number]) Entered By: [name] on 02/21/2015 10:56:24 [name], [name] ([id number]) -------------------------------------------------------------------------------- HPI Details Patient Name: [name] Date of Service: 02/20/2015 3:30 PM Medical Record Patient Account Number: [id number] Number: Treating [name]: [name] Jun 19, 1945 (70 y.o. Other Clinician: Date of Birth/Sex: Female) Treating BURNS [name], Primary Care Physician: [name] Physician/Extender: [name] Referring Physician: Sherrie [name] Weeks in Treatment: 0 History of Present Illness HPI Description: Pleasant 70 year old with diabetes (no hemoglobin A1c available) and severe peripheral vascular disease. She reports a prior left lower extremity stent. Records unavailable. Underwent multiple level angioplasty of left lower extremity 10/29/2014 by Dr. Wyn [name]. Developed left calf ulcerations 1 week ago. Denies any trauma. Seen by her PCP today. Bactrim prescribed. Referred to the wound clinic. She reports worsening left lower extremity pain. Concerning for ischemic rest pain. She performs minimal ambulation. No fever or chills. Minimal drainage. Electronic Signature(s) Signed: 02/21/2015 11:05:00 AM By: [name] Bhat MD Entered By: [name] on 02/21/2015 11:00:05 [name], [name] ([id number]) -------------------------------------------------------------------------------- Physical Exam Details Patient Name: [name] Date of Service: 02/20/2015 3:30 PM Medical Record Patient Account Number: [id number] Number: Treating [name]: [name] 1945/02/27  (70 y.o. Other Clinician: Date of Birth/Sex: Female) Treating BURNS [name], Primary Care Physician: [name] Physician/Extender: [name] Referring Physician: Sherrie [name] Weeks in Treatment: 0 Constitutional . Pulse regular. Respirations normal and unlabored. Afebrile. Marland Kitchen Respiratory WNL. No retractions.. Cardiovascular . Integumentary (Hair, Skin) .Marland Kitchen Neurological Sensation normal to touch, pin,and vibration. Psychiatric Judgement and insight Intact.. Oriented times 3.. No evidence of depression, anxiety, or agitation.. Notes Multiple left anterior and medial calf ulcerations. Partial and full-thickness. Biopsy culture obtained today. Superficial debridement performed. No significant cellulitis. 2+ pitting edema. No palpable pedal pulses.  Dopplerable, monophasic DP and PT, barely audible. Electronic Signature(s) Signed: 02/21/2015 11:05:00 AM By: [name] BhatBurns, [name], Walter MD Entered By: [name] BhatBurns, [name], [name] on 02/21/2015 11:01:30 [name], Shealynn N. ([id number]) -------------------------------------------------------------------------------- Physician Orders Details Patient Name: [name], [name] Date of Service: 02/20/2015 3:30 PM Medical Record Patient Account Number: [id number] Number: Treating [name]: [name] 04/26/1945 (70 y.o. Other Clinician: Date of Birth/Sex: Female) Treating BURNS [name], Primary Care Physician: [name] Physician/Extender: [name] BeckersWALTER Referring Physician: Sherrie MustacheJADALI, [name] Weeks in Treatment: 0 Verbal / Phone Orders: Yes Clinician: Huel CoventryWoody, [name] Read Back and Verified: Yes Diagnosis Coding Wound Cleansing Wound #1 Left,Anterior Lower Leg o Clean wound with wound cleanser. Wound #2 Left,Medial Lower Leg o Clean wound with wound cleanser. Anesthetic Wound #1 Left,Anterior Lower Leg o Topical Lidocaine 4% cream applied to wound bed prior to debridement Wound #2 Left,Medial Lower Leg o Topical Lidocaine 4% cream applied to wound bed prior to  debridement Primary Wound Dressing Wound #1 Left,Anterior Lower Leg o Prisma Ag Wound #2 Left,Medial Lower Leg o Prisma Ag Secondary Dressing Wound #1 Left,Anterior Lower Leg o Gauze and Kerlix/Conform Wound #2 Left,Medial Lower Leg o Gauze and Kerlix/Conform Dressing Change Frequency Wound #1 Left,Anterior Lower Leg o Change dressing every other day. Wound #2 Left,Medial Lower Leg o Change dressing every other day. [name], [name] ([id number]) Follow-up Appointments Wound #1 Left,Anterior Lower Leg o Return Appointment in 1 week. Wound #2 Left,Medial Lower Leg o Return Appointment in 1 week. Edema Control Wound #1 Left,Anterior Lower Leg o Tubigrip Wound #2 Left,Medial Lower Leg o Tubigrip Additional Orders / Instructions Wound #1 Left,Anterior Lower Leg o Increase protein intake. o Activity as tolerated Wound #2 Left,Medial Lower Leg o Increase protein intake. o Activity as tolerated Medications-please add to medication list. Wound #1 Left,Anterior Lower Leg o P.O. Antibiotics - take Bactrim as prescribed by outside MD. Wound #2 Left,Medial Lower Leg o P.O. Antibiotics - take Bactrim as prescribed by outside MD. Laboratory o Culture and Sensitivity Notes Patient instructed to reschedule appointment with Dr. Wyn Quakerew ASAP (missed visit last Friday). Electronic Signature(s) Signed: 02/20/2015 5:28:49 PM By: [name] GurneyWoody, [name], BSN, [name], BSN Signed: 02/21/2015 8:51:38 AM By: [name] BhatBurns, [name], Walter MD Entered By: [name] GurneyWoody, [name], BSN, [name] on 02/20/2015 16:27:39 [name], Arbor N. ([id number]) -------------------------------------------------------------------------------- Problem List Details Patient Name: [name], [name] Date of Service: 02/20/2015 3:30 PM Medical Record Patient Account Number: [id number] Number: Treating [name]: [name] SitesDorthy, Joanna 04/26/1945 (70 y.o. 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 ([id number]) -------------------------------------------------------------------------------- Progress Note/History and Physical Details Patient Name: [name], [name] Date of Service: 02/20/2015 3:30 PM Medical Record Patient Account Number: [id number] Number: Treating [name]: [name] SitesDorthy, Joanna 04/26/1945 (70 y.o. Other Clinician: Date of Birth/Sex: Female) Treating BURNS [name], Primary Care Physician: [name] Physician/Extender: [name] BeckersWALTER Referring Physician: Sherrie MustacheJADALI, [name] Weeks in Treatment: 0 Subjective Chief Complaint Information obtained from Patient Left calf ulcerations. Arterial  insufficiency. History of Present Illness (HPI) Pleasant 70 year old with diabetes (no hemoglobin A1c available) and severe peripheral vascular disease. She reports a prior left lower extremity stent. Records unavailable. Underwent multiple level angioplasty of left lower extremity 10/29/2014 by Dr. Wyn [name]. Developed left calf ulcerations 1 week ago. Denies any trauma. Seen by her PCP today. Bactrim prescribed. Referred to the wound clinic. She reports worsening left lower extremity pain. Concerning for ischemic rest pain. She performs minimal ambulation. No fever or chills. Minimal  drainage. Wound History Patient presents with 1 open wound that has been present for approximately 3 weeks. Patient has been treating wound in the following manner: soaking in epson salt. Laboratory tests have not been performed in the last month. Patient reportedly has not tested positive for an antibiotic resistant organism. Patient reportedly has not tested positive for osteomyelitis. Patient reportedly has had testing performed to evaluate circulation in the legs. Patient experiences the following problems associated with their wounds: swelling. Patient History Information obtained from Patient. Allergies codeine, penicillin, Iodinated Contrast Media - IV Dye Social History Never smoker, Marital Status - Married, Alcohol Use - Never, Drug Use - No History, Caffeine Use - Never. Medical History Cardiovascular Patient has history of Hypertension, Peripheral Arterial Disease Denies history of Peripheral Venous Disease [name], SHAWNISE PETERKIN ([id number]) Endocrine Patient has history of Type II Diabetes Musculoskeletal Patient has history of Osteoarthritis Neurologic Patient has history of Neuropathy Oncologic Denies history of Received Chemotherapy, Received Radiation Patient is treated with Oral Agents. Blood sugar is tested. Review of Systems (ROS) Constitutional Symptoms (General Health) The patient has no complaints or symptoms. Eyes The patient has no complaints or symptoms. Ear/Nose/Mouth/Throat The patient has no complaints or symptoms. Hematologic/Lymphatic The patient has no complaints or symptoms. Respiratory The patient has no complaints or symptoms. Cardiovascular Complains or has symptoms of LE edema. Gastrointestinal The patient has no complaints or symptoms. Genitourinary The patient has no complaints or symptoms. Immunological The patient has no complaints or symptoms. Integumentary (Skin) The patient has no complaints or symptoms. Neurologic The patient has  no complaints or symptoms. Oncologic The patient has no complaints or symptoms. Psychiatric The patient has no complaints or symptoms. Objective Constitutional Pulse regular. Respirations normal and unlabored. Afebrile. Vitals Time Taken: 3:31 PM, Height: 67 in, Source: Stated, Weight: 302 lbs, Source: Stated, BMI: 47.3, Barona, Catalina N. ([id number]) Temperature: 98.3 F, Pulse: 82 bpm, Respiratory Rate: 18 breaths/min, Blood Pressure: 142/56 mmHg, Capillary Blood Glucose: 207 mg/dl. Respiratory WNL. No retractions.. Neurological Sensation normal to touch, pin,and vibration. Psychiatric Judgement and insight Intact.. Oriented times 3.. No evidence of depression, anxiety, or agitation.. General Notes: Multiple left anterior and medial calf ulcerations. Partial and full-thickness. Biopsy culture obtained today. Superficial debridement performed. No significant cellulitis. 2+ pitting edema. No palpable pedal pulses. Dopplerable, monophasic DP and PT, barely audible. Integumentary (Hair, Skin) Wound #1 status is Open. Original cause of wound was Blister. The wound is located on the Left,Anterior Lower Leg. The wound measures 2.8cm length x 2.5cm width x 0.1cm depth; 5.498cm^2 area and 0.55cm^3 volume. The wound is limited to skin breakdown. There is no tunneling or undermining noted. There is a large amount of serous drainage noted. The wound margin is flat and intact. There is large (67- 100%) red granulation within the wound bed. There is no necrotic tissue within the wound bed. The periwound skin appearance did not exhibit: Callus, Crepitus, Excoriation, Fluctuance, Friable, Induration, Localized Edema, Rash, Scarring, Dry/Scaly, Maceration, Moist, Atrophie Blanche, Cyanosis, Ecchymosis, Hemosiderin Staining,  Mottled, Pallor, Rubor, Erythema. The periwound has tenderness on palpation. Wound #2 status is Open. Original cause of wound was Blister. The wound is located on the  Left,Medial Lower Leg. The wound measures 0.8cm length x 0.9cm width x 0.2cm depth; 0.565cm^2 area and 0.113cm^3 volume. The wound is limited to skin breakdown. There is no tunneling or undermining noted. There is a none present amount of drainage noted. The wound margin is flat and intact. There is no granulation within the wound bed. There is a large (67-100%) amount of necrotic tissue within the wound bed including Eschar. The periwound skin appearance did not exhibit: Callus, Crepitus, Excoriation, Fluctuance, Friable, Induration, Localized Edema, Rash, Scarring, Dry/Scaly, Maceration, Moist, Atrophie Blanche, Cyanosis, Ecchymosis, Hemosiderin Staining, Mottled, Pallor, Rubor, Erythema. The periwound has tenderness on palpation. 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 Severe peripheral vascular disease. Diabetes. Procedures Wound #2 Wound #2 is a Venous Leg Ulcer located on the Left,Medial Lower Leg . There was a Non-Viable Tissue Open Wound/Selective [phone]) debridement with total area of 0.72 sq cm performed by BURNS [name], [name]., MD. with the following instrument(s): Curette to remove Non-Viable tissue/material including Eschar after achieving pain control using Other (lidocaine 4%). A time out was conducted prior to the start of the procedure. There was no bleeding. The procedure was tolerated well with a pain level of 0 throughout and a pain level of 0 following the procedure. Post Debridement Measurements: 0.8cm length x 0.9cm width x 0.2cm depth; 0.113cm^3 volume. Post procedure Diagnosis Wound #2: Same as  Pre-Procedure Plan Wound Cleansing: Wound #1 Left,Anterior Lower Leg: Clean wound with wound cleanser. Wound #2 Left,Medial Lower Leg: Clean wound with wound cleanser. Anesthetic: Wound #1 Left,Anterior Lower Leg: Topical Lidocaine 4% cream applied to wound bed prior to debridement Wound #2 Left,Medial Lower Leg: Topical Lidocaine 4% cream applied to wound bed prior to debridement Primary Wound Dressing: Wound #1 Left,Anterior Lower Leg: Prisma Ag Wound #2 Left,Medial Lower Leg: GERTURDE, KUBA. ([id number]) Prisma Ag Secondary Dressing: Wound #1 Left,Anterior Lower Leg: Gauze and Kerlix/Conform Wound #2 Left,Medial Lower Leg: Gauze and Kerlix/Conform Dressing Change Frequency: Wound #1 Left,Anterior Lower Leg: Change dressing every other day. Wound #2 Left,Medial Lower Leg: Change dressing every other day. Follow-up Appointments: Wound #1 Left,Anterior Lower Leg: Return Appointment in 1 week. Wound #2 Left,Medial Lower Leg: Return Appointment in 1 week. Edema Control: Wound #1 Left,Anterior Lower Leg: Tubigrip Wound #2 Left,Medial Lower Leg: Tubigrip Additional Orders / Instructions: Wound #1 Left,Anterior Lower Leg: Increase protein intake. Activity as tolerated Wound #2 Left,Medial Lower Leg: Increase protein intake. Activity as tolerated Medications-please add to medication list.: Wound #1 Left,Anterior Lower Leg: P.O. Antibiotics - take Bactrim as prescribed by outside MD. Wound #2 Left,Medial Lower Leg: P.O. Antibiotics - take Bactrim as prescribed by outside MD. Laboratory ordered were: Culture and Sensitivity General Notes: Patient instructed to reschedule appointment with Dr. Wyn [name] ASAP (missed visit last Friday). Promogran Prisma dressing changes. Tubigrip for edema control as tolerated. Continue Bactrim. Follow-up on cultures obtained today. Follow-up with Dr. Wyn [name] ASAP. She had an appointment last week but for some reason missed it. AYOMIDE, ZULETA  ([id number]) Electronic Signature(s) Signed: 02/21/2015 11:05:00  AM By: [name] Bhat MD Entered By: [name] on 02/21/2015 11:04:42 BRINDY, HIGGINBOTHAM ([id number]) -------------------------------------------------------------------------------- ROS/PFSH Details Patient Name: [name] Date of Service: 02/20/2015 3:30 PM Medical Record Patient Account Number: [id number] Number: Treating [name]: [name] 11/15/1945 (69 y.o. Other Clinician: Date of Birth/Sex: Female) Treating BURNS [name], Primary Care Physician: [name] Physician/Extender: [name] Referring Physician: Sherrie [name] Weeks in Treatment: 0 Label Progress Note Print Version as History and Physical for this encounter Information Obtained From Patient Wound History Do you currently have one or more open woundso Yes How many open wounds do you currently haveo 1 Approximately how long have you had your woundso 3 weeks How have you been treating your wound(s) until nowo soaking in epson salt Has your wound(s) ever healed and then re-openedo No Have you had any lab work done in the past montho No Have you tested positive for an antibiotic resistant organism (MRSA, VRE)o No Have you tested positive for osteomyelitis (bone infection)o No Have you had any tests for circulation on your legso Yes Who ordered the testo PCP Where was the test doneo [facility] Have you had other problems associated with your woundso Swelling Cardiovascular Complaints and Symptoms: Positive for: LE edema Medical History: Positive for: Hypertension; Peripheral Arterial Disease Negative for: Peripheral Venous Disease Constitutional Symptoms (General Health) Complaints and Symptoms: No Complaints or Symptoms Eyes Complaints and Symptoms: No Complaints or Symptoms Ear/Nose/Mouth/Throat OTILLIA, CORDONE ([id number]) Complaints and Symptoms: No Complaints or Symptoms Hematologic/Lymphatic Complaints and Symptoms: No  Complaints or Symptoms Respiratory Complaints and Symptoms: No Complaints or Symptoms Gastrointestinal Complaints and Symptoms: No Complaints or Symptoms Endocrine Medical History: Positive for: Type II Diabetes Time with diabetes: 10 years Treated with: Oral agents Blood sugar tested every day: Yes Tested : qd Genitourinary Complaints and Symptoms: No Complaints or Symptoms Immunological Complaints and Symptoms: No Complaints or Symptoms Integumentary (Skin) Complaints and Symptoms: No Complaints or Symptoms Musculoskeletal Medical History: Positive for: Osteoarthritis Neurologic Complaints and Symptoms: No Complaints or Symptoms Medical HistoryADAH, STONEBERG ([id number]) Positive for: Neuropathy Oncologic Complaints and Symptoms: No Complaints or Symptoms Medical History: Negative for: Received Chemotherapy; Received Radiation Psychiatric Complaints and Symptoms: No Complaints or Symptoms Family and Social History Never smoker; Marital Status - Married; Alcohol Use: Never; Drug Use: No History; Caffeine Use: Never; Financial Concerns: No; Food, Clothing or Shelter Needs: No; Support System Lacking: No; Transportation Concerns: No; Advanced Directives: No; Patient does not want information on Advanced Directives Physician Affirmation I have reviewed and agree with the above information. Electronic Signature(s) Signed: 02/21/2015 11:05:00 AM By: [name] Bhat MD Signed: 02/21/2015 5:56:46 PM By: [name] Previous Signature: 02/20/2015 5:20:05 PM Version By: [name] Previous Signature: 02/21/2015 8:51:38 AM Version By: [name] Bhat MD Entered By: [name] on 02/21/2015 11:04:26 Tally, [name] Stakes ([id number]) -------------------------------------------------------------------------------- SuperBill Details Patient Name: [name] Date of Service: 02/20/2015 Medical Record Patient Account Number: [id number] Number: Treating  [name]: [name] 03-Dec-1945 (69 y.o. 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, left I70.322 leg Facility Procedures CPT4: Description Modifier Quantity Code 24401027  99213 - WOUND CARE VISIT-LEV 3 EST PT 1 CPT4: 91478295 97597 - DEBRIDE WOUND 1ST 20 SQ CM OR < 1 ICD-10 Description Diagnosis 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 Physician Procedures CPT4: Description Modifier Quantity Code 6213086 99204 - WC PHYS LEVEL 4 - NEW PT 1 ICD-10 Description Diagnosis I73.9 Peripheral vascular disease, unspecified 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

## 2015-02-22 NOTE — Progress Notes (Signed)
[name], [name] ([id number]) Visit Report for 02/20/2015 Allergy List Details Patient Name: [name], [name] Date of Service: 02/20/2015 3:30 PM Medical Record Number: [id number] Patient Account Number: [id number] Date of Birth/Sex: Feb 15, 1946 (70 y.o. Female) Treating RN: Curtis Sites Primary Care Physician: Sherrie Mustache Other Clinician: Referring Physician: Sherrie Mustache Treating Physician/Extender: BURNS III, WALTER Weeks in Treatment: 0 Allergies Active Allergies codeine penicillin Iodinated Contrast Media - IV Dye Allergy Notes Electronic Signature(s) Signed: 02/20/2015 5:20:05 PM By: Curtis Sites Entered By: Curtis Sites on 02/20/2015 15:32:10 [name] ([id number]) -------------------------------------------------------------------------------- Arrival Information Details Patient Name: [name] Date of Service: 02/20/2015 3:30 PM Medical Record Number: [id number] Patient Account Number: [id number] Date of Birth/Sex: 06/15/1945 (70 y.o. Female) Treating RN: Curtis Sites Primary Care Physician: Sherrie Mustache Other Clinician: Referring Physician: Sherrie Mustache Treating Physician/Extender: BURNS III, Regis Bill in Treatment: 0 Visit Information Patient Arrived: Wheel Chair Arrival Time: 15:26 Accompanied By: self Transfer Assistance: None Patient Identification Verified: Yes Secondary Verification Process Yes Completed: Patient Has Alerts: Yes Patient Alerts: Patient on Blood Thinner plavix, aspirin 81mg  DMII Electronic Signature(s) Signed: 02/20/2015 5:20:05 PM By: Curtis Sites Entered By: Curtis Sites on 02/20/2015 15:29:24 [name] ([id number]) -------------------------------------------------------------------------------- Clinic Level of Care Assessment Details Patient Name: [name] Date of Service: 02/20/2015 3:30 PM Medical Record Number: [id number] Patient Account Number: [id number] Date of Birth/Sex: 10-Jan-1946 (69 y.o.  Female) Treating RN: Huel Coventry Primary Care Physician: Sherrie Mustache Other Clinician: Referring Physician: Sherrie Mustache Treating Physician/Extender: BURNS III, Regis Bill in Treatment: 0 Clinic Level of Care Assessment Items TOOL 1 Quantity Score []  - Use when EandM and Procedure is performed on INITIAL visit 0 ASSESSMENTS - Nursing Assessment / Reassessment X - General Physical Exam (combine w/ comprehensive assessment (listed just 1 20 below) when performed on new pt. evals) X - Comprehensive Assessment (HX, ROS, Risk Assessments, Wounds Hx, etc.) 1 25 ASSESSMENTS - Wound and Skin Assessment / Reassessment []  - Dermatologic / Skin Assessment (not related to wound area) 0 ASSESSMENTS - Ostomy and/or Continence Assessment and Care []  - Incontinence Assessment and Management 0 []  - Ostomy Care Assessment and Management (repouching, etc.) 0 PROCESS - Coordination of Care X - Simple Patient / Family Education for ongoing care 1 15 []  - Complex (extensive) Patient / Family Education for ongoing care 0 X - Staff obtains Chiropractor, Records, Test Results / Process Orders 1 10 []  - Staff telephones HHA, Nursing Homes / Clarify orders / etc 0 []  - Routine Transfer to another Facility (non-emergent condition) 0 []  - Routine Hospital Admission (non-emergent condition) 0 X - New Admissions / Manufacturing engineer / Ordering NPWT, Apligraf, etc. 1 15 []  - Emergency Hospital Admission (emergent condition) 0 PROCESS - Special Needs []  - Pediatric / Minor Patient Management 0 []  - Isolation Patient Management 0 [name], [name] ([id number]) []  - Hearing / Language / Visual special needs 0 []  - Assessment of Community assistance (transportation, D/C planning, etc.) 0 []  - Additional assistance / Altered mentation 0 []  - Support Surface(s) Assessment (bed, cushion, seat, etc.) 0 INTERVENTIONS - Miscellaneous []  - External ear exam 0 []  - Patient Transfer (multiple staff / Nurse, adult /  Similar devices) 0 []  - Simple Staple / Suture removal (25 or less) 0 []  - Complex Staple / Suture removal (26 or more) 0 []  - Hypo/Hyperglycemic Management (do not check if billed separately) 0 X - Ankle / Brachial Index (ABI) - do not check if billed separately 1 15 Has  the patient been seen at the hospital within the last three years: Yes Total Score: 100 Level Of Care: New/Established - Level 3 Electronic Signature(s) Signed: 02/20/2015 5:28:49 PM By: Elliot Gurney, RN, BSN, Kim RN, BSN Entered By: Elliot Gurney, RN, BSN, Kim on 02/20/2015 16:40:16 [name], [name] ([id number]) -------------------------------------------------------------------------------- Encounter Discharge Information Details Patient Name: [name] Date of Service: 02/20/2015 3:30 PM Medical Record Number: [id number] Patient Account Number: [id number] Date of Birth/Sex: 16-Apr-1945 (69 y.o. Female) Treating RN: Curtis Sites Primary Care Physician: Sherrie Mustache Other Clinician: Referring Physician: Sherrie Mustache Treating Physician/Extender: BURNS III, Regis Bill in Treatment: 0 Encounter Discharge Information Items Discharge Pain Level: 0 Discharge Condition: Stable Ambulatory Status: Wheelchair Discharge Destination: Home Transportation: Private Auto Accompanied By: friend Schedule Follow-up Appointment: Yes Medication Reconciliation completed No and provided to Patient/Care [name]: Provided on Clinical Summary of Care: 02/20/2015 Form Type Recipient Paper Patient MW Electronic Signature(s) Signed: 02/20/2015 4:38:51 PM By: Curtis Sites Previous Signature: 02/20/2015 4:20:44 PM Version By: Gwenlyn Perking Entered By: Curtis Sites on 02/20/2015 16:38:50 [name] ([id number]) -------------------------------------------------------------------------------- Lower Extremity Assessment Details Patient Name: [name] Date of Service: 02/20/2015 3:30 PM Medical Record Number: [id number] Patient Account  Number: [id number] Date of Birth/Sex: 1945-04-01 (69 y.o. Female) Treating RN: Curtis Sites Primary Care Physician: Sherrie Mustache Other Clinician: Referring Physician: Sherrie Mustache Treating Physician/Extender: BURNS III, WALTER Weeks in Treatment: 0 Edema Assessment Assessed: [Left: No] [Right: No] Edema: [Left: Yes] [Right: Yes] Calf Left: Right: Point of Measurement: 32 cm From Medial Instep 40.5 cm 40.5 cm Ankle Left: Right: Point of Measurement: 11 cm From Medial Instep 26.6 cm 25.8 cm Vascular Assessment Claudication: Claudication Assessment [Left:None] [Right:None] Pulses: Posterior Tibial Palpable: [Left:No] [Right:No] Doppler: [Left:Monophasic] [Right:Monophasic] Dorsalis Pedis Palpable: [Left:No] [Right:Yes] Doppler: [Left:Monophasic] [Right:Monophasic] Extremity colors, hair growth, and conditions: Extremity Color: [Left:Hyperpigmented] [Right:Hyperpigmented] Hair Growth on Extremity: [Left:No] [Right:No] Temperature of Extremity: [Left:Warm] [Right:Warm] Capillary Refill: [Left:< 3 seconds] [Right:< 3 seconds] Blood Pressure: Brachial: [Left:136] [Right:138] Dorsalis Pedis: [Left:Dorsalis Pedis: 118] Ankle: Posterior Tibial: [Left:Posterior Tibial: 116] [Right:0.86] Toe Nail Assessment Left: Right: Thick: Yes Yes Discolored: Yes Yes [name], [name] ([id number]) Deformed: No No Improper Length and Hygiene: Yes Yes Electronic Signature(s) Signed: 02/20/2015 5:20:05 PM By: Curtis Sites Signed: 02/20/2015 5:28:49 PM By: Elliot Gurney RN, BSN, Kim RN, BSN Entered By: Elliot Gurney, RN, BSN, Kim on 02/20/2015 16:04:19 [name], [name] ([id number]) -------------------------------------------------------------------------------- Multi Wound Chart Details Patient Name: [name] Date of Service: 02/20/2015 3:30 PM Medical Record Number: [id number] Patient Account Number: [id number] Date of Birth/Sex: 27-Jun-1945 (70 y.o. Female) Treating RN: Huel Coventry Primary Care Physician:  Sherrie Mustache Other Clinician: Referring Physician: Sherrie Mustache Treating Physician/Extender: BURNS III, WALTER Weeks in Treatment: 0 Vital Signs Height(in): 67 Capillary Blood 207 Glucose(mg/dl): Weight(lbs): 732 Pulse(bpm): 82 Body Mass Index(BMI): 47 Blood Pressure Temperature(F): 98.3 142/56 (mmHg): Respiratory Rate 18 (breaths/min): Photos: [1:No Photos] [2:No Photos] [N/A:N/A] Wound Location: [1:Left Lower Leg - Anterior] [2:Left Lower Leg - Medial] [N/A:N/A] Wounding Event: [1:Blister] [2:Blister] [N/A:N/A] Primary Etiology: [1:Venous Leg Ulcer] [2:Venous Leg Ulcer] [N/A:N/A] Comorbid History: [1:Hypertension, Peripheral Arterial Disease, Type II Diabetes, Osteoarthritis, Neuropathy] [2:Hypertension, Peripheral Arterial Disease, Type II Diabetes, Osteoarthritis, Neuropathy] [N/A:N/A] Date Acquired: [1:01/28/2015] [2:01/28/2015] [N/A:N/A] Weeks of Treatment: [1:0] [2:0] [N/A:N/A] Wound Status: [1:Open] [2:Open] [N/A:N/A] Clustered Wound: [1:Yes] [2:No] [N/A:N/A] Measurements L x W x D 2.8x2.5x0.1 [2:0.8x0.9x0.2] [N/A:N/A] (cm) Area (cm) : [1:5.498] [2:0.565] [N/A:N/A] Volume (cm) : [1:0.55] [2:0.113] [N/A:N/A] Classification: [1:Partial Thickness] [2:Unclassifiable] [N/A:N/A] HBO Classification: [1:Grade 1] [2:Grade 1] [N/A:N/A] Exudate Amount: [  1:Large] [2:None Present] [N/A:N/A] Exudate Type: [1:Serous] [2:N/A] [N/A:N/A] Exudate Color: [1:amber] [2:N/A] [N/A:N/A] Wound Margin: [1:Flat and Intact] [2:Flat and Intact] [N/A:N/A] Granulation Amount: [1:Large (67-100%)] [2:None Present (0%)] [N/A:N/A] Granulation Quality: [1:Red] [2:N/A] [N/A:N/A] Necrotic Amount: [1:None Present (0%)] [2:Large (67-100%)] [N/A:N/A] Necrotic Tissue: [1:N/A] [2:Eschar] [N/A:N/A] Exposed Structures: [1:Fascia: No Fat: No Tendon: No Muscle: No] [2:Fascia: No Fat: No Tendon: No Muscle: No] [N/A:N/A] Joint: No Joint: No Bone: No Bone: No Limited to Skin Limited to Skin Breakdown  Breakdown Epithelialization: None None N/A Periwound Skin Texture: Edema: No Edema: No N/A Excoriation: No Excoriation: No Induration: No Induration: No Callus: No Callus: No Crepitus: No Crepitus: No Fluctuance: No Fluctuance: No Friable: No Friable: No Rash: No Rash: No Scarring: No Scarring: No Periwound Skin Maceration: No Maceration: No N/A Moisture: Moist: No Moist: No Dry/Scaly: No Dry/Scaly: No Periwound Skin Color: Atrophie Blanche: No Atrophie Blanche: No N/A Cyanosis: No Cyanosis: No Ecchymosis: No Ecchymosis: No Erythema: No Erythema: No Hemosiderin Staining: No Hemosiderin Staining: No Mottled: No Mottled: No Pallor: No Pallor: No Rubor: No Rubor: No Tenderness on Yes Yes N/A Palpation: Wound Preparation: Ulcer Cleansing: Ulcer Cleansing: N/A Rinsed/Irrigated with Rinsed/Irrigated with Saline Saline Topical Anesthetic Topical Anesthetic Applied: None Applied: Other: lidocaine 4% Treatment Notes Electronic Signature(s) Signed: 02/20/2015 5:28:49 PM By: Elliot Gurney, RN, BSN, Kim RN, BSN Entered By: Elliot Gurney, RN, BSN, Kim on 02/20/2015 16:06:34 STUTI, SANDIN ([id number]) -------------------------------------------------------------------------------- Multi-Disciplinary Care Plan Details Patient Name: [name] Date of Service: 02/20/2015 3:30 PM Medical Record Number: [id number] Patient Account Number: [id number] Date of Birth/Sex: 03/21/1945 (69 y.o. Female) Treating RN: Huel Coventry Primary Care Physician: Sherrie Mustache Other Clinician: Referring Physician: Sherrie Mustache Treating Physician/Extender: BURNS III, Regis Bill in Treatment: 0 Active Inactive Abuse / Safety / Falls / Self Care Management Nursing Diagnoses: Potential for falls Goals: Patient will remain injury free Date Initiated: 02/20/2015 Goal Status: Active Interventions: Assess fall risk on admission and as needed Notes: Nutrition Nursing Diagnoses: Potential for  alteratiion in Nutrition/Potential for imbalanced nutrition Goals: Patient/caregiver will maintain therapeutic glucose control Date Initiated: 02/20/2015 Goal Status: Active Interventions: Assess patient nutrition upon admission and as needed per policy Notes: Orientation to the Wound Care Program Nursing Diagnoses: Knowledge deficit related to the wound healing center program Goals: Patient/caregiver will verbalize understanding of the Wound Healing Center Program Date Initiated: 02/20/2015 DARCEE, DEKKER ([id number]) Goal Status: Active Interventions: Provide education on orientation to the wound center Notes: Wound/Skin Impairment Nursing Diagnoses: Impaired tissue integrity Goals: Ulcer/skin breakdown will heal within 14 weeks Date Initiated: 02/20/2015 Goal Status: Active Interventions: Assess ulceration(s) every visit Notes: Electronic Signature(s) Signed: 02/20/2015 5:28:49 PM By: Elliot Gurney, RN, BSN, Kim RN, BSN Entered By: Elliot Gurney, RN, BSN, Kim on 02/20/2015 16:05:55 Vandenberg, [name] ([id number]) -------------------------------------------------------------------------------- Patient/Caregiver Education Details Patient Name: [name] Date of Service: 02/20/2015 3:30 PM Medical Record Number: [id number] Patient Account Number: [id number] Date of Birth/Gender: [birth year] (69 y.o. Female) Treating RN: Curtis Sites Primary Care Physician: Sherrie Mustache Other Clinician: Referring Physician: Sherrie Mustache Treating Physician/Extender: BURNS III, Regis Bill in Treatment: 0 Education Assessment Education Provided To: Patient Education Topics Provided Wound/Skin Impairment: Handouts: Other: wound care as ordered Methods: Demonstration, Explain/Verbal Responses: State content correctly Electronic Signature(s) Signed: 02/20/2015 4:39:08 PM By: Curtis Sites Entered By: Curtis Sites on 02/20/2015 16:39:08 [place]lett, [name]  ([id number]) -------------------------------------------------------------------------------- Wound Assessment Details Patient Name: [name] Date of Service: 02/20/2015 3:30 PM Medical Record Number: [id number] Patient Account Number: [id number] Date of Birth/Sex: July 19, 1945 (69  y.o. Female) Treating RN: Curtis Sites Primary Care Physician: Sherrie Mustache Other Clinician: Referring Physician: Sherrie Mustache Treating Physician/Extender: BURNS III, WALTER Weeks in Treatment: 0 Wound Status Wound Number: 1 Primary Venous Leg Ulcer Etiology: Wound Location: Left Lower Leg - Anterior Wound Open Wounding Event: Blister Status: Date Acquired: 01/28/2015 Comorbid Hypertension, Peripheral Arterial Weeks Of Treatment: 0 History: Disease, Type II Diabetes, Clustered Wound: Yes Osteoarthritis, Neuropathy Photos Photo Uploaded By: Curtis Sites on 02/20/2015 16:44:17 Wound Measurements Length: (cm) 2.8 Width: (cm) 2.5 Depth: (cm) 0.1 Area: (cm) 5.498 Volume: (cm) 0.55 % Reduction in Area: % Reduction in Volume: Epithelialization: None Tunneling: No Undermining: No Wound Description Classification: Partial Thickness Foul Odor A Diabetic Severity (Wagner): Grade 1 Wound Margin: Flat and Intact Exudate Amount: Large Exudate Type: Serous Exudate Color: amber fter Cleansing: No Wound Bed Granulation Amount: Large (67-100%) Exposed Structure Granulation Quality: Red Fascia Exposed: No Necrotic Amount: None Present (0%) Fat Layer Exposed: No Hoffmeister, Cristi N. ([id number]) Tendon Exposed: No Muscle Exposed: No Joint Exposed: No Bone Exposed: No Limited to Skin Breakdown Periwound Skin Texture Texture Color No Abnormalities Noted: No No Abnormalities Noted: No Callus: No Atrophie Blanche: No Crepitus: No Cyanosis: No Excoriation: No Ecchymosis: No Fluctuance: No Erythema: No Friable: No Hemosiderin Staining: No Induration: No Mottled: No Localized Edema:  No Pallor: No Rash: No Rubor: No Scarring: No Temperature / Pain Moisture Tenderness on Palpation: Yes No Abnormalities Noted: No Dry / Scaly: No Maceration: No Moist: No Wound Preparation Ulcer Cleansing: Rinsed/Irrigated with Saline Topical Anesthetic Applied: None Treatment Notes Wound #1 (Left, Anterior Lower Leg) 1. Cleansed with: Clean wound with Normal Saline 2. Anesthetic Topical Lidocaine 4% cream to wound bed prior to debridement 4. Dressing Applied: Prisma Ag 5. Secondary Dressing Applied ABD and Kerlix/Conform 7. Secured with Tape Tubigrip Electronic Signature(s) Signed: 02/20/2015 5:20:05 PM By: Curtis Sites Entered By: Curtis Sites on 02/20/2015 15:45:47 [name], [name] [name], [name] ([id number]) -------------------------------------------------------------------------------- Wound Assessment Details Patient Name: [name] Date of Service: 02/20/2015 3:30 PM Medical Record Number: [id number] Patient Account Number: [id number] Date of Birth/Sex: 25-Apr-1945 (70 y.o. Female) Treating RN: Curtis Sites Primary Care Physician: Sherrie Mustache Other Clinician: Referring Physician: Sherrie Mustache Treating Physician/Extender: BURNS III, WALTER Weeks in Treatment: 0 Wound Status Wound Number: 2 Primary Venous Leg Ulcer Etiology: Wound Location: Left Lower Leg - Medial Wound Open Wounding Event: Blister Status: Date Acquired: 01/28/2015 Comorbid Hypertension, Peripheral Arterial Weeks Of Treatment: 0 History: Disease, Type II Diabetes, Clustered Wound: No Osteoarthritis, Neuropathy Photos Photo Uploaded By: Curtis Sites on 02/20/2015 16:44:17 Wound Measurements Length: (cm) 0.8 Width: (cm) 0.9 Depth: (cm) 0.2 Area: (cm) 0.565 Volume: (cm) 0.113 % Reduction in Area: % Reduction in Volume: Epithelialization: None Tunneling: No Undermining: No Wound Description Classification: Unclassifiable Diabetic Severity (Wagner): Grade  1 Wound Margin: Flat and Intact Exudate Amount: None Present Foul Odor After Cleansing: No Wound Bed Granulation Amount: None Present (0%) Exposed Structure Necrotic Amount: Large (67-100%) Fascia Exposed: No Necrotic Quality: Eschar Fat Layer Exposed: No Tendon Exposed: No Muscle Exposed: No Monty, Loleta N. ([id number]) Joint Exposed: No Bone Exposed: No Limited to Skin Breakdown Periwound Skin Texture Texture Color No Abnormalities Noted: No No Abnormalities Noted: No Callus: No Atrophie Blanche: No Crepitus: No Cyanosis: No Excoriation: No Ecchymosis: No Fluctuance: No Erythema: No Friable: No Hemosiderin Staining: No Induration: No Mottled: No Localized Edema: No Pallor: No Rash: No Rubor: No Scarring: No Temperature / Pain Moisture Tenderness on Palpation: Yes No Abnormalities Noted:  No Dry / Scaly: No Maceration: No Moist: No Wound Preparation Ulcer Cleansing: Rinsed/Irrigated with Saline Topical Anesthetic Applied: Other: lidocaine 4%, Treatment Notes Wound #2 (Left, Medial Lower Leg) 1. Cleansed with: Clean wound with Normal Saline 2. Anesthetic Topical Lidocaine 4% cream to wound bed prior to debridement 4. Dressing Applied: Prisma Ag 5. Secondary Dressing Applied ABD and Kerlix/Conform 7. Secured with Tape Tubigrip Electronic Signature(s) Signed: 02/20/2015 5:20:05 PM By: Curtis Sitesorthy, Joanna Entered By: Curtis Sitesorthy, Joanna on 02/20/2015 15:47:00 Top, [name] [name] ([id number]) -------------------------------------------------------------------------------- Vitals Details Patient Name: [name] InfanteWOODS, Alaysia N. Date of Service: 02/20/2015 3:30 PM Medical Record Number: [id number] Patient Account Number: [id number] Date of Birth/Sex: [birth year] 9(69 y.o. Female) Treating RN: Curtis Sitesorthy, Joanna Primary Care Physician: Sherrie MustacheJADALI, FAYEGH Other Clinician: Referring Physician: Sherrie MustacheJADALI, FAYEGH Treating Physician/Extender: BURNS III, WALTER Weeks in Treatment: 0 Vital Signs Time  Taken: 15:31 Temperature (F): 98.3 Height (in): 67 Pulse (bpm): 82 Source: Stated Respiratory Rate (breaths/min): 18 Weight (lbs): 302 Blood Pressure (mmHg): 142/56 Source: Stated Capillary Blood Glucose (mg/dl): 782207 Body Mass Index (BMI): 47.3 Reference Range: 80 - 120 mg / dl Electronic Signature(s) Signed: 02/20/2015 5:20:05 PM By: Curtis Sitesorthy, Joanna Entered By: Curtis Sitesorthy, Joanna on 02/20/2015 15:31:34

## 2015-02-24 LAB — WOUND CULTURE

## 2015-02-27 ENCOUNTER — Encounter: Payer: Medicare Other | Admitting: Surgery

## 2015-02-27 DIAGNOSIS — M1990 Unspecified osteoarthritis, unspecified site: Secondary | ICD-10-CM | POA: Diagnosis not present

## 2015-02-27 DIAGNOSIS — G629 Polyneuropathy, unspecified: Secondary | ICD-10-CM | POA: Diagnosis not present

## 2015-02-27 DIAGNOSIS — L97929 Non-pressure chronic ulcer of unspecified part of left lower leg with unspecified severity: Secondary | ICD-10-CM | POA: Diagnosis not present

## 2015-02-27 DIAGNOSIS — Z880 Allergy status to penicillin: Secondary | ICD-10-CM | POA: Diagnosis not present

## 2015-02-27 DIAGNOSIS — I70342 Atherosclerosis of unspecified type of bypass graft(s) of the left leg with ulceration of calf: Secondary | ICD-10-CM | POA: Diagnosis not present

## 2015-02-27 DIAGNOSIS — I10 Essential (primary) hypertension: Secondary | ICD-10-CM | POA: Diagnosis not present

## 2015-02-27 DIAGNOSIS — M79605 Pain in left leg: Secondary | ICD-10-CM | POA: Diagnosis not present

## 2015-02-27 DIAGNOSIS — I771 Stricture of artery: Secondary | ICD-10-CM | POA: Diagnosis not present

## 2015-02-27 DIAGNOSIS — I70322 Atherosclerosis of unspecified type of bypass graft(s) of the extremities with rest pain, left leg: Secondary | ICD-10-CM | POA: Diagnosis not present

## 2015-02-27 DIAGNOSIS — E11610 Type 2 diabetes mellitus with diabetic neuropathic arthropathy: Secondary | ICD-10-CM | POA: Diagnosis not present

## 2015-02-27 DIAGNOSIS — I739 Peripheral vascular disease, unspecified: Secondary | ICD-10-CM | POA: Diagnosis not present

## 2015-02-27 DIAGNOSIS — E11622 Type 2 diabetes mellitus with other skin ulcer: Secondary | ICD-10-CM | POA: Diagnosis not present

## 2015-02-27 DIAGNOSIS — R600 Localized edema: Secondary | ICD-10-CM | POA: Diagnosis not present

## 2015-02-28 NOTE — Progress Notes (Signed)
[name], [name] ([id number]) Visit Report for 02/27/2015 Arrival Information Details Patient Name: [name], [name] Date of Service: 02/27/2015 1:00 PM Medical Record Number: [id number] Patient Account Number: [id number] Date of Birth/Sex: 05/22/1945 (70 y.o. Female) Treating RN: Ashok Cordia, Debi Primary Care Physician: Sherrie Mustache Other Clinician: Referring Physician: Sherrie Mustache Treating Physician/Extender: BURNS III, WALTER Weeks in Treatment: 1 Visit Information History Since Last Visit All ordered tests and consults were completed: No Patient Arrived: Wheel Chair Added or deleted any medications: No Arrival Time: 12:50 Any new allergies or adverse reactions: No Accompanied By: cousin Had a fall or experienced change in No Transfer Assistance: EasyPivot Patient activities of daily living that may affect Lift risk of falls: Patient Identification Verified: Yes Signs or symptoms of abuse/neglect since last No Secondary Verification Process Yes visito Completed: Hospitalized since last visit: No Patient Requires Transmission- No Pain Present Now: Yes Based Precautions: Patient Has Alerts: Yes Patient Alerts: Patient on Blood Thinner plavix, aspirin 81mg  DMII Electronic Signature(s) Signed: 02/27/2015 4:31:17 PM By: Alejandro Mulling Entered By: Alejandro Mulling on 02/27/2015 15:21:59 [name], [name] ([id number]) -------------------------------------------------------------------------------- Clinic Level of Care Assessment Details Patient Name: [name] Date of Service: 02/27/2015 1:00 PM Medical Record Number: [id number] Patient Account Number: [id number] Date of Birth/Sex: 1945-08-14 (70 y.o. Female) Treating RN: Curtis Sites Primary Care Physician: Sherrie Mustache Other Clinician: Referring Physician: Sherrie Mustache Treating Physician/Extender: BURNS III, Regis Bill in Treatment: 1 Clinic Level of Care Assessment Items TOOL 4 Quantity Score []  - Use when  only an EandM is performed on FOLLOW-UP visit 0 ASSESSMENTS - Nursing Assessment / Reassessment X - Reassessment of Co-morbidities (includes updates in patient status) 1 10 X - Reassessment of Adherence to Treatment Plan 1 5 ASSESSMENTS - Wound and Skin Assessment / Reassessment []  - Simple Wound Assessment / Reassessment - one wound 0 X - Complex Wound Assessment / Reassessment - multiple wounds 2 5 []  - Dermatologic / Skin Assessment (not related to wound area) 0 ASSESSMENTS - Focused Assessment []  - Circumferential Edema Measurements - multi extremities 0 []  - Nutritional Assessment / Counseling / Intervention 0 X - Lower Extremity Assessment (monofilament, tuning fork, pulses) 1 5 []  - Peripheral Arterial Disease Assessment (using hand held doppler) 0 ASSESSMENTS - Ostomy and/or Continence Assessment and Care []  - Incontinence Assessment and Management 0 []  - Ostomy Care Assessment and Management (repouching, etc.) 0 PROCESS - Coordination of Care X - Simple Patient / Family Education for ongoing care 1 15 []  - Complex (extensive) Patient / Family Education for ongoing care 0 []  - Staff obtains Chiropractor, Records, Test Results / Process Orders 0 []  - Staff telephones HHA, Nursing Homes / Clarify orders / etc 0 []  - Routine Transfer to another Facility (non-emergent condition) 0 [name], [name] ([id number]) []  - Routine Hospital Admission (non-emergent condition) 0 []  - New Admissions / Manufacturing engineer / Ordering NPWT, Apligraf, etc. 0 []  - Emergency Hospital Admission (emergent condition) 0 X - Simple Discharge Coordination 1 10 []  - Complex (extensive) Discharge Coordination 0 PROCESS - Special Needs []  - Pediatric / Minor Patient Management 0 []  - Isolation Patient Management 0 []  - Hearing / Language / Visual special needs 0 []  - Assessment of Community assistance (transportation, D/C planning, etc.) 0 []  - Additional assistance / Altered mentation 0 []  - Support  Surface(s) Assessment (bed, cushion, seat, etc.) 0 INTERVENTIONS - Wound Cleansing / Measurement []  - Simple Wound Cleansing - one wound 0 X - Complex Wound Cleansing - multiple  wounds 2 5 X - Wound Imaging (photographs - any number of wounds) 1 5 []  - Wound Tracing (instead of photographs) 0 []  - Simple Wound Measurement - one wound 0 X - Complex Wound Measurement - multiple wounds 2 5 INTERVENTIONS - Wound Dressings []  - Small Wound Dressing one or multiple wounds 0 X - Medium Wound Dressing one or multiple wounds 2 15 []  - Large Wound Dressing one or multiple wounds 0 X - Application of Medications - topical 1 5 []  - Application of Medications - injection 0 INTERVENTIONS - Miscellaneous []  - External ear exam 0 [name], [name] ([id number]) []  - Specimen Collection (cultures, biopsies, blood, body fluids, etc.) 0 []  - Specimen(s) / Culture(s) sent or taken to Lab for analysis 0 []  - Patient Transfer (multiple staff / Michiel Sites Lift / Similar devices) 0 []  - Simple Staple / Suture removal (25 or less) 0 []  - Complex Staple / Suture removal (26 or more) 0 []  - Hypo / Hyperglycemic Management (close monitor of Blood Glucose) 0 []  - Ankle / Brachial Index (ABI) - do not check if billed separately 0 X - Vital Signs 1 5 Has the patient been seen at the hospital within the last three years: Yes Total Score: 120 Level Of Care: New/Established - Level 4 Electronic Signature(s) Signed: 02/27/2015 5:20:20 PM By: Curtis Sites Entered By: Curtis Sites on 02/27/2015 17:20:19 [name], [name] -------------------------------------------------------------------------------- Encounter Discharge Information Details Patient Name: [name] Date of Service: 02/27/2015 1:00 PM Medical Record Number: [id number] Patient Account Number: [id number] Date of Birth/Sex: 1945/02/27 (69 y.o. Female) Treating RN: Ashok Cordia, Debi Primary Care Physician: Sherrie Mustache Other Clinician: Referring  Physician: Sherrie Mustache Treating Physician/Extender: BURNS III, Regis Bill in Treatment: 1 Encounter Discharge Information Items Discharge Pain Level: 5 Discharge Condition: Stable Ambulatory Status: Wheelchair Discharge Destination: Home Transportation: Private Auto Accompanied By: cousin Schedule Follow-up Appointment: Yes Medication Reconciliation completed and provided to Patient/Care Yes Bronc Brosseau: Provided on Clinical Summary of Care: 02/27/2015 Form Type Recipient Paper Patient MW Electronic Signature(s) Signed: 02/27/2015 4:31:17 PM By: Alejandro Mulling Previous Signature: 02/27/2015 1:39:22 PM Version By: Gwenlyn Perking Entered By: Alejandro Mulling on 02/27/2015 13:40:16 Delucchi, [name] ([id number]) -------------------------------------------------------------------------------- Lower Extremity Assessment Details Patient Name: [name] Date of Service: 02/27/2015 1:00 PM Medical Record Number: [id number] Patient Account Number: [id number] Date of Birth/Sex: 1945-06-09 (69 y.o. Female) Treating RN: Ashok Cordia, Debi Primary Care Physician: Sherrie Mustache Other Clinician: Referring Physician: Sherrie Mustache Treating Physician/Extender: BURNS III, WALTER Weeks in Treatment: 1 Edema Assessment Assessed: [Left: No] [Right: No] Edema: [Left: Ye] [Right: s] Calf Left: Right: Point of Measurement: cm From Medial Instep 39 cm cm Ankle Left: Right: Point of Measurement: cm From Medial Instep 26 cm cm Vascular Assessment Pulses: Posterior Tibial Dorsalis Pedis Palpable: [Left:No] Doppler: [Left:Multiphasic] Extremity colors, hair growth, and conditions: Extremity Color: [Left:Hyperpigmented] Hair Growth on Extremity: [Left:No] Temperature of Extremity: [Left:Warm] Capillary Refill: [Left:< 3 seconds] Toe Nail Assessment Left: Right: Thick: Yes Discolored: Yes Deformed: Yes Improper Length and Hygiene: Yes Electronic Signature(s) Signed: 02/27/2015 4:31:17 PM  By: Alejandro Mulling Entered By: Alejandro Mulling on 02/27/2015 13:01:15 Ralls, [name] ([id number] Moise, [name] ([id number]) -------------------------------------------------------------------------------- Multi Wound Chart Details Patient Name: [name] Date of Service: 02/27/2015 1:00 PM Medical Record Number: [id number] Patient Account Number: [id number] Date of Birth/Sex: 1945/12/11 (69 y.o. Female) Treating RN: Ashok Cordia, Debi Primary Care Physician: Sherrie Mustache Other Clinician: Referring Physician: Sherrie Mustache Treating Physician/Extender: BURNS III, WALTER Weeks in Treatment: 1  Vital Signs Height(in): 67 Pulse(bpm): 70 Weight(lbs): 302 Blood Pressure 151/65 (mmHg): Body Mass Index(BMI): 47 Temperature(F): 97.8 Respiratory Rate 18 (breaths/min): Photos: [1:No Photos] [2:No Photos] [N/A:N/A] Wound Location: [1:Left Lower Leg - Anterior] [2:Left Lower Leg - Medial] [N/A:N/A] Wounding Event: [1:Blister] [2:Blister] [N/A:N/A] Primary Etiology: [1:Venous Leg Ulcer] [2:Venous Leg Ulcer] [N/A:N/A] Comorbid History: [1:Hypertension, Peripheral Arterial Disease, Type II Diabetes, Osteoarthritis, Neuropathy] [2:Hypertension, Peripheral Arterial Disease, Type II Diabetes, Osteoarthritis, Neuropathy] [N/A:N/A] Date Acquired: [1:01/28/2015] [2:01/28/2015] [N/A:N/A] Weeks of Treatment: [1:1] [2:1] [N/A:N/A] Wound Status: [1:Open] [2:Open] [N/A:N/A] Clustered Wound: [1:Yes] [2:No] [N/A:N/A] Measurements L x W x D 1.5x3.5x0.1 [2:1x1x0.2] [N/A:N/A] (cm) Area (cm) : [1:4.123] [2:0.785] [N/A:N/A] Volume (cm) : [1:0.412] [2:0.157] [N/A:N/A] % Reduction in Area: [1:25.00%] [2:-38.90%] [N/A:N/A] % Reduction in Volume: 25.10% [2:-38.90%] [N/A:N/A] Classification: [1:Partial Thickness] [2:Unclassifiable] [N/A:N/A] HBO Classification: [1:Grade 1] [2:Grade 1] [N/A:N/A] Exudate Amount: [1:Large] [2:None Present] [N/A:N/A] Exudate Type: [1:Serosanguineous] [2:N/A]  [N/A:N/A] Exudate Color: [1:red, brown] [2:N/A] [N/A:N/A] Wound Margin: [1:Flat and Intact] [2:Flat and Intact] [N/A:N/A] Granulation Amount: [1:Large (67-100%)] [2:None Present (0%)] [N/A:N/A] Granulation Quality: [1:Red] [2:N/A] [N/A:N/A] Necrotic Amount: [1:Small (1-33%)] [2:Large (67-100%)] [N/A:N/A] Necrotic Tissue: [1:Eschar, Adherent Slough] [2:Eschar] [N/A:N/A] Exposed Structures: [N/A:N/A] Fascia: No Fascia: No Fat: No Fat: No Tendon: No Tendon: No Muscle: No Muscle: No Joint: No Joint: No Bone: No Bone: No Limited to Skin Limited to Skin Breakdown Breakdown Epithelialization: None None N/A Periwound Skin Texture: Edema: Yes Edema: Yes N/A Excoriation: No Excoriation: No Induration: No Induration: No Callus: No Callus: No Crepitus: No Crepitus: No Fluctuance: No Fluctuance: No Friable: No Friable: No Rash: No Rash: No Scarring: No Scarring: No Periwound Skin Moist: Yes Maceration: No N/A Moisture: Maceration: No Moist: No Dry/Scaly: No Dry/Scaly: No Periwound Skin Color: Atrophie Blanche: No Atrophie Blanche: No N/A Cyanosis: No Cyanosis: No Ecchymosis: No Ecchymosis: No Erythema: No Erythema: No Hemosiderin Staining: No Hemosiderin Staining: No Mottled: No Mottled: No Pallor: No Pallor: No Rubor: No Rubor: No Tenderness on Yes Yes N/A Palpation: Wound Preparation: Ulcer Cleansing: Ulcer Cleansing: N/A Rinsed/Irrigated with Rinsed/Irrigated with Saline Saline Topical Anesthetic Topical Anesthetic Applied: None Applied: Other: lidocaine 4% Treatment Notes Electronic Signature(s) Signed: 02/27/2015 4:31:17 PM By: Alejandro Mulling Entered By: Alejandro Mulling on 02/27/2015 13:07:53 [name], [name] ([id number]) -------------------------------------------------------------------------------- Multi-Disciplinary Care Plan Details Patient Name: [name] Date of Service: 02/27/2015 1:00 PM Medical Record Number: [id number] Patient  Account Number: [id number] Date of Birth/Sex: [birth year] (70 y.o. Female) Treating RN: Ashok Cordia, Debi Primary Care Physician: Sherrie Mustache Other Clinician: Referring Physician: Sherrie Mustache Treating Physician/Extender: BURNS III, Regis Bill in Treatment: 1 Active Inactive Abuse / Safety / Falls / Self Care Management Nursing Diagnoses: Potential for falls Goals: Patient will remain injury free Date Initiated: 02/20/2015 Goal Status: Active Interventions: Assess fall risk on admission and as needed Notes: Nutrition Nursing Diagnoses: Potential for alteratiion in Nutrition/Potential for imbalanced nutrition Goals: Patient/caregiver will maintain therapeutic glucose control Date Initiated: 02/20/2015 Goal Status: Active Interventions: Assess patient nutrition upon admission and as needed per policy Notes: Orientation to the Wound Care Program Nursing Diagnoses: Knowledge deficit related to the wound healing center program Goals: Patient/caregiver will verbalize understanding of the Wound Healing Center Program Date Initiated: 02/20/2015 [name], [name] ([id number]) Goal Status: Active Interventions: Provide education on orientation to the wound center Notes: Wound/Skin Impairment Nursing Diagnoses: Impaired tissue integrity Goals: Ulcer/skin breakdown will heal within 14 weeks Date Initiated: 02/20/2015 Goal Status: Active Interventions: Assess ulceration(s) every visit Notes: Electronic Signature(s) Signed: 02/27/2015 4:31:17 PM By: Alejandro Mulling Entered  By: Alejandro MullingPinkerton, Debra on 02/27/2015 13:07:46 Marseille, [name] [name] ([id number]) -------------------------------------------------------------------------------- Pain Assessment Details Patient Name: [name] InfanteWOODS, [name] Date of Service: 02/27/2015 1:00 PM Medical Record Number: [id number] Patient Account Number: [id number] Date of Birth/Sex: September 14, 1945 (70 y.o. Female) Treating RN: Ashok CordiaPinkerton, Debi Primary Care Physician: Sherrie MustacheJADALI,  FAYEGH Other Clinician: Referring Physician: Sherrie MustacheJADALI, FAYEGH Treating Physician/Extender: BURNS III, WALTER Weeks in Treatment: 1 Active Problems Location of Pain Severity and Description of Pain Patient Has Paino Yes Site Locations Pain Location: Pain in Ulcers With Dressing Change: Yes Duration of the Pain. Constant / Intermittento Constant Rate the pain. Current Pain Level: 5 Worst Pain Level: 8 Least Pain Level: 5 Character of Pain Describe the Pain: Sharp, Stabbing, Throbbing Pain Management and Medication Current Pain Management: Electronic Signature(s) Signed: 02/27/2015 4:31:17 PM By: Alejandro MullingPinkerton, Debra Entered By: Alejandro MullingPinkerton, Debra on 02/27/2015 12:52:36 Ferraz, [name] [name] ([id number]) -------------------------------------------------------------------------------- Patient/Caregiver Education Details Patient Name: [name] InfanteWOODS, [name] Date of Service: 02/27/2015 1:00 PM Medical Record Number: [id number] Patient Account Number: [id number] Date of Birth/Gender: September 14, 1945 (69 y.o. Female) Treating RN: Ashok CordiaPinkerton, Debi Primary Care Physician: Sherrie MustacheJADALI, FAYEGH Other Clinician: Referring Physician: Sherrie MustacheJADALI, FAYEGH Treating Physician/Extender: BURNS III, Regis BillWALTER Weeks in Treatment: 1 Education Assessment Education Provided To: Patient Education Topics Provided Wound/Skin Impairment: Handouts: Other: change dressing as directed Methods: Demonstration, Explain/Verbal Responses: State content correctly Electronic Signature(s) Signed: 02/27/2015 4:31:17 PM By: Alejandro MullingPinkerton, Debra Entered By: Alejandro MullingPinkerton, Debra on 02/27/2015 13:40:34 Welker, [name] [name] ([id number]) -------------------------------------------------------------------------------- Wound Assessment Details Patient Name: [name] InfanteWOODS, [name] Date of Service: 02/27/2015 1:00 PM Medical Record Number: [id number] Patient Account Number: [id number] Date of Birth/Sex: September 14, 1945 (69 y.o. Female) Treating RN: Ashok CordiaPinkerton, Debi Primary Care Physician:  Sherrie MustacheJADALI, FAYEGH Other Clinician: Referring Physician: Sherrie MustacheJADALI, FAYEGH Treating Physician/Extender: BURNS III, WALTER Weeks in Treatment: 1 Wound Status Wound Number: 1 Primary Venous Leg Ulcer Etiology: Wound Location: Left Lower Leg - Anterior Wound Open Wounding Event: Blister Status: Date Acquired: 01/28/2015 Comorbid Hypertension, Peripheral Arterial Weeks Of Treatment: 1 History: Disease, Type II Diabetes, Clustered Wound: Yes Osteoarthritis, Neuropathy Photos Photo Uploaded By: Alejandro MullingPinkerton, Debra on 02/27/2015 15:45:46 Wound Measurements Length: (cm) 1.5 Width: (cm) 3.5 Depth: (cm) 0.1 Area: (cm) 4.123 Volume: (cm) 0.412 % Reduction in Area: 25% % Reduction in Volume: 25.1% Epithelialization: None Tunneling: No Undermining: No Wound Description Classification: Partial Thickness Diabetic Severity (Wagner): Grade 1 Wound Margin: Flat and Intact Exudate Amount: Medium Exudate Type: Serosanguineous Exudate Color: red, brown Foul Odor After Cleansing: No Wound Bed Granulation Amount: Large (67-100%) Exposed Structure Granulation Quality: Red Fascia Exposed: No Necrotic Amount: Small (1-33%) Fat Layer Exposed: No Raphael, Kaisley N. ([id number]) Necrotic Quality: Eschar, Adherent Slough Tendon Exposed: No Muscle Exposed: No Joint Exposed: No Bone Exposed: No Limited to Skin Breakdown Periwound Skin Texture Texture Color No Abnormalities Noted: No No Abnormalities Noted: No Callus: No Atrophie Blanche: No Crepitus: No Cyanosis: No Excoriation: No Ecchymosis: No Fluctuance: No Erythema: No Friable: No Hemosiderin Staining: No Induration: No Mottled: No Localized Edema: Yes Pallor: No Rash: No Rubor: No Scarring: No Temperature / Pain Moisture Tenderness on Palpation: Yes No Abnormalities Noted: No Dry / Scaly: No Maceration: No Moist: Yes Wound Preparation Ulcer Cleansing: Rinsed/Irrigated with Saline Topical Anesthetic Applied: None Treatment  Notes Wound #1 (Left, Anterior Lower Leg) 1. Cleansed with: Clean wound with Normal Saline 2. Anesthetic Topical Lidocaine 4% cream to wound bed prior to debridement 4. Dressing Applied: Prisma Ag 5. Secondary Dressing Applied Gauze and Kerlix/Conform Notes telfa Electronic Signature(s) Signed: 02/27/2015 4:31:17 PM By: Alejandro MullingPinkerton, Debra Entered By:  Alejandro Mulling on 02/27/2015 15:18:10 KORYNNE, DOLS ([id number]) -------------------------------------------------------------------------------- Wound Assessment Details Patient Name: TYCHELLE, PURKEY Date of Service: 02/27/2015 1:00 PM Medical Record Number: [id number] Patient Account Number: [id number] Date of Birth/Sex: 04-08-1945 (70 y.o. Female) Treating RN: Ashok Cordia, Debi Primary Care Physician: Sherrie Mustache Other Clinician: Referring Physician: Sherrie Mustache Treating Physician/Extender: BURNS III, WALTER Weeks in Treatment: 1 Wound Status Wound Number: 2 Primary Venous Leg Ulcer Etiology: Wound Location: Left Lower Leg - Medial Wound Open Wounding Event: Blister Status: Date Acquired: 01/28/2015 Comorbid Hypertension, Peripheral Arterial Weeks Of Treatment: 1 History: Disease, Type II Diabetes, Clustered Wound: No Osteoarthritis, Neuropathy Photos Photo Uploaded By: Alejandro Mulling on 02/27/2015 15:45:46 Wound Measurements Length: (cm) 1 Width: (cm) 1 Depth: (cm) 0.2 Area: (cm) 0.785 Volume: (cm) 0.157 % Reduction in Area: -38.9% % Reduction in Volume: -38.9% Epithelialization: None Tunneling: No Undermining: No Wound Description Classification: Unclassifiable Diabetic Severity [address]): Grade 1 Wound Margin: Flat and Intact Exudate Amount: None Present Foul Odor After Cleansing: No Wound Bed Granulation Amount: None Present (0%) Exposed Structure Necrotic Amount: Large (67-100%) Fascia Exposed: No Necrotic Quality: Eschar Fat Layer Exposed: No Tendon Exposed: No Muscle Exposed: No Stcharles,  Sheketa N. ([id number]) Joint Exposed: No Bone Exposed: No Limited to Skin Breakdown Periwound Skin Texture Texture Color No Abnormalities Noted: No No Abnormalities Noted: No Callus: No Atrophie Blanche: No Crepitus: No Cyanosis: No Excoriation: No Ecchymosis: No Fluctuance: No Erythema: No Friable: No Hemosiderin Staining: No Induration: No Mottled: No Localized Edema: Yes Pallor: No Rash: No Rubor: No Scarring: No Temperature / Pain Moisture Tenderness on Palpation: Yes No Abnormalities Noted: No Dry / Scaly: No Maceration: No Moist: No Wound Preparation Ulcer Cleansing: Rinsed/Irrigated with Saline Topical Anesthetic Applied: Other: lidocaine 4%, Treatment Notes Wound #2 (Left, Medial Lower Leg) 1. Cleansed with: Clean wound with Normal Saline 2. Anesthetic Topical Lidocaine 4% cream to wound bed prior to debridement 4. Dressing Applied: Santyl Ointment 5. Secondary Dressing Applied Gauze and Kerlix/Conform Notes telfa Electronic Signature(s) Signed: 02/27/2015 4:31:17 PM By: Alejandro Mulling Entered By: Alejandro Mulling on 02/27/2015 13:03:12 LATESIA, NORRINGTON ([id number]) -------------------------------------------------------------------------------- Vitals Details Patient Name: [name] Date of Service: 02/27/2015 1:00 PM Medical Record Number: [id number] Patient Account Number: [id number] Date of Birth/Sex: 03/06/1945 (70 y.o. Female) Treating RN: Ashok Cordia, Debi Primary Care Physician: Sherrie Mustache Other Clinician: Referring Physician: Sherrie Mustache Treating Physician/Extender: BURNS III, WALTER Weeks in Treatment: 1 Vital Signs Time Taken: 12:52 Temperature (F): 97.8 Height (in): 67 Pulse (bpm): 70 Weight (lbs): 302 Respiratory Rate (breaths/min): 18 Body Mass Index (BMI): 47.3 Blood Pressure (mmHg): 151/65 Reference Range: 80 - 120 mg / dl Electronic Signature(s) Signed: 02/27/2015 4:31:17 PM By: Alejandro Mulling Entered By:  Alejandro Mulling on 02/27/2015 12:53:02

## 2015-02-28 NOTE — Progress Notes (Addendum)
MAYSON, STERBENZ ([id number]) Visit Report for 02/27/2015 Chief Complaint Document Details Patient Name: [name], [name] Date of Service: 02/27/2015 1:00 PM Medical Record Patient Account Number: [id number] Number: Treating RN: Phillis Haggis 1945/03/04 (70 y.o. Other Clinician: Date of Birth/Sex: Female) Treating BURNS III, Primary Care Physician: Sherrie Mustache Physician/Extender: Zollie Beckers Referring Physician: Sherrie Mustache Weeks in Treatment: 1 Information Obtained from: Patient Chief Complaint Left calf ulcerations. Arterial insufficiency. Electronic Signature(s) Signed: 02/27/2015 3:52:06 PM By: Madelaine Bhat MD Entered By: Madelaine Bhat on 02/27/2015 13:38:18 Atchley, [name] ([id number]) -------------------------------------------------------------------------------- HPI Details Patient Name: [name] Date of Service: 02/27/2015 1:00 PM Medical Record Patient Account Number: [id number] Number: Treating RN: Phillis Haggis 08/24/1945 (70 y.o. Other Clinician: Date of Birth/Sex: Female) Treating BURNS III, Primary Care Physician: Sherrie Mustache Physician/Extender: Zollie Beckers Referring Physician: Sherrie Mustache Weeks in Treatment: 1 History of Present Illness HPI Description: Pleasant 70 year old with diabetes (no hemoglobin A1c available) and severe peripheral vascular disease. She reports a prior left lower extremity stent. Records unavailable. Underwent multiple level angioplasty of left lower extremity 10/29/2014 by Dr. Wyn Quaker. Developed left calf ulcerations in late December 2016. Denies any trauma. Seen by her PCP. Bactrim prescribed. Referred to the wound clinic. Biopsy culture 02/20/2015 grew methicillin sensitive staph aureus, sensitive to Bactrim. Performing dressing changes with Prisma. Using a Tubigrip for edema control. Scheduled to see Dr. Wyn Quaker in follow-up 02/25/2015. Cancelled because of weather. Rescheduled for 03/06/2015. She returns  to clinic for follow-up and is without new complaints. Pain improved. No ischemic rest pain. No fever or chills. Minimal drainage. Electronic Signature(s) Signed: 02/27/2015 3:52:06 PM By: Madelaine Bhat MD Entered By: Madelaine Bhat on 02/27/2015 13:41:56 Kille, [name] ([id number]) -------------------------------------------------------------------------------- Physical Exam Details Patient Name: [name] Date of Service: 02/27/2015 1:00 PM Medical Record Patient Account Number: [id number] Number: Treating RN: Phillis Haggis 28-Apr-1945 (70 y.o. Other Clinician: Date of Birth/Sex: Female) Treating BURNS III, Primary Care Physician: Sherrie Mustache Physician/Extender: Zollie Beckers Referring Physician: Sherrie Mustache Weeks in Treatment: 1 Constitutional . Pulse regular. Respirations normal and unlabored. Afebrile. Marland Kitchen Respiratory WNL. No retractions.. Cardiovascular . Integumentary (Hair, Skin) .Marland Kitchen Neurological Sensation normal to touch, pin,and vibration. Psychiatric Judgement and insight Intact.. Oriented times 3.. No evidence of depression, anxiety, or agitation.. Notes Left anterior calf ulcerations improved. Partial-thickness. Biofilm wiped free with gauze. Healthy underlying dermis. Left medial calf ulceration with adherent necrotic soft tissue. Too tender to debride. Cellulitis resolved. 1+ pitting edema. No palpable pedal pulses per her baseline. Dopplerable, monophasic DP and PT. Could not obtain ABI. Electronic Signature(s) Signed: 02/27/2015 3:52:06 PM By: Madelaine Bhat MD Entered By: Madelaine Bhat on 02/27/2015 13:44:57 Giarratano, [name] ([id number]) -------------------------------------------------------------------------------- Physician Orders Details Patient Name: [name] Date of Service: 02/27/2015 1:00 PM Medical Record Patient Account Number: [id number] Number: Treating RN: Phillis Haggis 1945-06-20 (70 y.o. Other  Clinician: Date of Birth/Sex: Female) Treating BURNS III, Primary Care Physician: Sherrie Mustache Physician/Extender: Zollie Beckers Referring Physician: Sherrie Mustache Weeks in Treatment: 1 Verbal / Phone Orders: Yes Clinician: Pinkerton, Debi Read Back and Verified: Yes Diagnosis Coding Wound Cleansing Wound #1 Left,Anterior Lower Leg o Clean wound with wound cleanser. Wound #2 Left,Medial Lower Leg o Clean wound with wound cleanser. Anesthetic Wound #1 Left,Anterior Lower Leg o Topical Lidocaine 4% cream applied to wound bed prior to debridement Wound #2 Left,Medial Lower Leg o Topical Lidocaine 4% cream applied to wound bed prior to debridement Primary Wound Dressing Wound #1 Left,Anterior Lower  Leg o Prisma Ag Wound #2 Left,Medial Lower Leg o Santyl Ointment Secondary Dressing Wound #1 Left,Anterior Lower Leg o Conform/Kerlix - telfa Wound #2 Left,Medial Lower Leg o Conform/Kerlix - telfa Dressing Change Frequency Wound #1 Left,Anterior Lower Leg o Change dressing every day. Wound #2 Left,Medial Lower Leg o Change dressing every day. EZINNE, YOGI ([id number]) Follow-up Appointments Wound #1 Left,Anterior Lower Leg o Return Appointment in 2 weeks. Wound #2 Left,Medial Lower Leg o Return Appointment in 2 weeks. Edema Control Wound #1 Left,Anterior Lower Leg o Elevate legs to the level of the heart and pump ankles as often as possible o Tubigrip Wound #2 Left,Medial Lower Leg o Elevate legs to the level of the heart and pump ankles as often as possible o Tubigrip Home Health Wound #1 Left,Anterior Lower Leg o Initiate Home Health for Skilled Nursing o Home Health Nurse may visit PRN to address patientos wound care needs. o FACE TO FACE ENCOUNTER: MEDICARE and MEDICAID PATIENTS: I certify that this patient is under my care and that I had a face-to-face encounter that meets the physician face-to-face encounter requirements with this  patient on this date. The encounter with the patient was in whole or in part for the following MEDICAL CONDITION: (primary reason for Home Healthcare) MEDICAL NECESSITY: I certify, that based on my findings, NURSING services are a medically necessary home health service. HOME BOUND STATUS: I certify that my clinical findings support that this patient is homebound (i.e., Due to illness or injury, pt requires aid of supportive devices such as crutches, cane, wheelchairs, walkers, the use of special transportation or the assistance of another person to leave their place of residence. There is a normal inability to leave the home and doing so requires considerable and taxing effort. Other absences are for medical reasons / religious services and are infrequent or of short duration when for other reasons). o If current dressing causes regression in wound condition, may D/C ordered dressing product/s and apply Normal Saline Moist Dressing daily until next Wound Healing Center / Other MD appointment. Notify Wound Healing Center of regression in wound condition at [phone]. o Please direct any NON-WOUND related issues/requests for orders to patient's Primary Care Physician Wound #2 Left,Medial Lower Leg o Initiate Home Health for Skilled Nursing o Home Health Nurse may visit PRN to address patientos wound care needs. o FACE TO FACE ENCOUNTER: MEDICARE and MEDICAID PATIENTS: I certify that this patient is under my care and that I had a face-to-face encounter that meets the physician face-to-face encounter requirements with this patient on this date. The encounter with the patient was in whole or in part for the following MEDICAL CONDITION: (primary reason for Home Healthcare) MEDICAL NECESSITY: I certify, that based on my findings, NURSING services are a medically necessary home health service. HOME BOUND STATUS: I certify that my clinical findings TYNEKA, SCAFIDI ([id number]) support  that this patient is homebound (i.e., Due to illness or injury, pt requires aid of supportive devices such as crutches, cane, wheelchairs, walkers, the use of special transportation or the assistance of another person to leave their place of residence. There is a normal inability to leave the home and doing so requires considerable and taxing effort. Other absences are for medical reasons / religious services and are infrequent or of short duration when for other reasons). o If current dressing causes regression in wound condition, may D/C ordered dressing product/s and apply Normal Saline Moist Dressing daily until next Wound Healing Center / Other  MD appointment. Notify Wound Healing Center of regression in wound condition at [phone]. o Please direct any NON-WOUND related issues/requests for orders to patient's Primary Care Physician Notes continue taking your Bactrim Electronic Signature(s) Signed: 02/28/2015 5:10:55 PM By: Alejandro Mulling Signed: 03/01/2015 8:11:47 AM By: Madelaine Bhat MD Previous Signature: 02/27/2015 3:52:06 PM Version By: Madelaine Bhat MD Previous Signature: 02/27/2015 4:31:17 PM Version By: Alejandro Mulling Entered By: Alejandro Mulling on 02/28/2015 16:47:01 Vierra, [name] ([id number]) -------------------------------------------------------------------------------- Problem List Details Patient Name: [name] Date of Service: 02/27/2015 1:00 PM Medical Record Patient Account Number: [id number] Number: Treating RN: Phillis Haggis Jun 09, 1945 (69 y.o. 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 Other Clinician: Date of Birth/Sex: Female) Treating BURNS III, Primary Care Physician: Sherrie Mustache Physician/Extender: Zollie Beckers Referring Physician: Sherrie Mustache Weeks in Treatment: 1 Subjective Chief Complaint Information obtained from Patient Left calf ulcerations. Arterial insufficiency. History of Present Illness (HPI) Pleasant 70 year old with diabetes (no hemoglobin A1c available) and severe peripheral vascular disease. She reports a prior left lower extremity stent. Records unavailable. Underwent multiple level angioplasty of left lower extremity 10/29/2014 by Dr. Wyn Quaker. Developed left calf ulcerations in late December 2016. Denies any trauma. Seen by her PCP. Bactrim prescribed. Referred to the wound clinic. Biopsy culture 02/20/2015 grew methicillin sensitive staph aureus, sensitive to Bactrim. Performing dressing changes with Prisma. Using a Tubigrip for edema control. Scheduled to see Dr. Wyn Quaker in follow-up 02/25/2015. Cancelled because of weather. Rescheduled for 03/06/2015. She returns to clinic for follow-up and is without new complaints. Pain improved. No ischemic rest pain. No fever or chills. Minimal drainage. Objective Constitutional Pulse regular.  Respirations normal and unlabored. Afebrile. Vitals Time Taken: 12:52 PM, Height: 67 in, Weight: 302 lbs, BMI: 47.3, Temperature: 97.8 F, Pulse: 70 bpm, Respiratory Rate: 18 breaths/min, Blood Pressure: 151/65 mmHg. Respiratory WNL. No retractions.. Neurological Sensation normal to touch, pin,and vibration. RANEE, PEASLEY ([id number]) Psychiatric Judgement and insight Intact.. Oriented times 3.. No evidence of depression, anxiety, or agitation.. General Notes: Left anterior calf ulcerations improved. Partial-thickness. Biofilm wiped free with gauze. Healthy underlying dermis. Left medial calf ulceration with adherent necrotic soft tissue. Too tender to debride. Cellulitis resolved. 1+ pitting edema. No palpable pedal pulses per her baseline. Dopplerable, monophasic DP and PT. Could not obtain ABI. Integumentary (Hair, Skin) Wound #1 status is Open. Original cause of wound was Blister. The wound is located on the Left,Anterior Lower Leg. The wound measures 1.5cm length x 3.5cm width x 0.1cm depth; 4.123cm^2 area and 0.412cm^3 volume. The wound is limited to skin breakdown. There is no tunneling or undermining noted. There is a medium amount of serosanguineous drainage noted. The wound margin is flat and intact.  There is large (67-100%) red granulation within the wound bed. There is a small (1-33%) amount of necrotic tissue within the wound bed including Eschar and Adherent Slough. The periwound skin appearance exhibited: Localized Edema, Moist. The periwound skin appearance did not exhibit: Callus, Crepitus, Excoriation, Fluctuance, Friable, Induration, Rash, Scarring, Dry/Scaly, Maceration, Atrophie Blanche, Cyanosis, Ecchymosis, Hemosiderin Staining, Mottled, Pallor, Rubor, Erythema. The periwound has tenderness on palpation. Wound #2 status is Open. Original cause of wound was Blister. The wound is located on the Left,Medial Lower Leg. The wound measures 1cm length x 1cm width x 0.2cm  depth; 0.785cm^2 area and 0.157cm^3 volume. The wound is limited to skin breakdown. There is no tunneling or undermining noted. There is a none present amount of drainage noted. The wound margin is flat and intact. There is no granulation within the wound bed. There is a large (67-100%) amount of necrotic tissue within the wound bed including Eschar. The periwound skin appearance exhibited: Localized Edema. The periwound skin appearance did not exhibit: Callus, Crepitus, Excoriation, Fluctuance, Friable, Induration, Rash, Scarring, Dry/Scaly, Maceration, Moist, Atrophie Blanche, Cyanosis, Ecchymosis, Hemosiderin Staining, Mottled, Pallor, Rubor, Erythema. The periwound has tenderness on palpation. 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 ([id number]) Left calf ulcerations. Arterial insufficiency. Resolved cellulitis. Plan Wound Cleansing: Wound #1 Left,Anterior Lower Leg: Clean wound with wound cleanser. Wound #2 Left,Medial Lower Leg: Clean wound with wound cleanser. Anesthetic: Wound #1 Left,Anterior Lower Leg: Topical Lidocaine 4% cream applied to wound bed prior to debridement Wound #2 Left,Medial Lower Leg: Topical Lidocaine 4% cream applied to wound bed prior to debridement Primary Wound Dressing: Wound #1 Left,Anterior Lower Leg: Prisma Ag Wound #2 Left,Medial Lower Leg: Santyl Ointment Secondary Dressing: Wound #1 Left,Anterior Lower Leg: Conform/Kerlix - telfa Wound #2 Left,Medial Lower Leg: Conform/Kerlix - telfa Dressing Change Frequency: Wound #1 Left,Anterior Lower Leg: Change dressing every day. Wound #2 Left,Medial Lower Leg: Change  dressing every day. Follow-up Appointments: Wound #1 Left,Anterior Lower Leg: Return Appointment in 2 weeks. Wound #2 Left,Medial Lower Leg: Return Appointment in 2 weeks. Edema Control: Wound #1 Left,Anterior Lower Leg: Elevate legs to the level of the heart and pump ankles as often as possible Tubigrip Wound #2 Left,Medial Lower Leg: Elevate legs to the level of the heart and pump ankles as often as possible Tubigrip Home Health: Wound #1 Left,Anterior Lower Leg: Initiate Home Health for Skilled Nursing Home Health Nurse may visit PRN to address patient s wound care needs. FACE TO FACE ENCOUNTER: MEDICARE and MEDICAID PATIENTS: I certify that this patient is under [name] InfanteWOODS, Josalin N. ([id number]) my care and that I had a face-to-face encounter that meets the physician face-to-face encounter requirements with this patient on this date. The encounter with the patient was in whole or in part for the following MEDICAL CONDITION: (primary reason for Home Healthcare) MEDICAL NECESSITY: I certify, that based on my findings, NURSING services are a medically necessary home health service. HOME BOUND STATUS: I certify that my clinical findings support that this patient is homebound (i.e., Due to illness or injury, pt requires aid of supportive devices such as crutches, cane, wheelchairs, walkers, the use of special transportation or the assistance of another person to leave their place of residence.  There is a normal inability to leave the home and doing so requires considerable and taxing effort. Other absences are for medical reasons / religious services and are infrequent or of short duration when for other reasons). If current dressing causes regression in wound condition, may D/C ordered dressing product/s and apply Normal Saline Moist Dressing daily until next Wound Healing Center / Other MD appointment. Notify Wound Healing Center of regression in wound condition at [phone]. Please  direct any NON-WOUND related issues/requests for orders to patient's Primary Care Physician Wound #2 Left,Medial Lower Leg: Initiate Home Health for Skilled Nursing Home Health Nurse may visit PRN to address patient s wound care needs. FACE TO FACE ENCOUNTER: MEDICARE and MEDICAID PATIENTS: I certify that this patient is under my care and that I had a face-to-face encounter that meets the physician face-to-face encounter requirements with this patient on this date. The encounter with the patient was in whole or in part for the following MEDICAL CONDITION: (primary reason for Home Healthcare) MEDICAL NECESSITY: I certify, that based on my findings, NURSING services are a medically necessary home health service. HOME BOUND STATUS: I certify that my clinical findings support that this patient is homebound (i.e., Due to illness or injury, pt requires aid of supportive devices such as crutches, cane, wheelchairs, walkers, the use of special transportation or the assistance of another person to leave their place of residence. There is a normal inability to leave the home and doing so requires considerable and taxing effort. Other absences are for medical reasons / religious services and are infrequent or of short duration when for other reasons). If current dressing causes regression in wound condition, may D/C ordered dressing product/s and apply Normal Saline Moist Dressing daily until next Wound Healing Center / Other MD appointment. Notify Wound Healing Center of regression in wound condition at [phone]. Please direct any NON-WOUND related issues/requests for orders to patient's Primary Care Physician General Notes: continue taking your Bactrim Continue with Promogran Prisma daily to left anterior calf ulcerations. Santyl to left medial calf ulceration. Tubigrip for edema control. Complete Bactrim. Patient is scheduled to follow-up with Dr. Wyn Quaker 03/06/2015. Electronic Signature(s) Signed:  03/01/2015 9:39:24 AM By: Madelaine Bhat MD Previous Signature: 02/27/2015 3:52:06 PM Version By: Madelaine Bhat MD Entered By: Madelaine Bhat on 03/01/2015 09:38:53 Gura, [name] ([id number]) -------------------------------------------------------------------------------- SuperBill Details Patient Name: [name] Date of Service: 02/27/2015 Medical Record Patient Account Number: [id number] Number: Treating RN: Phillis Haggis 1945-07-25 (69 y.o. 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, left I70.322 leg Facility Procedures CPT4 Code: [id number] Description: 99214 - WOUND CARE VISIT-LEV 4 EST PT Modifier: Quantity: 1 Physician Procedures CPT4: Description Modifier Quantity Code [id number] 99213 - WC PHYS LEVEL 3 - EST PT 1 ICD-10 Description Diagnosis 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

## 2015-03-05 DIAGNOSIS — L97221 Non-pressure chronic ulcer of left calf limited to breakdown of skin: Secondary | ICD-10-CM | POA: Diagnosis not present

## 2015-03-05 DIAGNOSIS — E1151 Type 2 diabetes mellitus with diabetic peripheral angiopathy without gangrene: Secondary | ICD-10-CM | POA: Diagnosis not present

## 2015-03-05 DIAGNOSIS — L97229 Non-pressure chronic ulcer of left calf with unspecified severity: Secondary | ICD-10-CM | POA: Diagnosis not present

## 2015-03-05 DIAGNOSIS — I70342 Atherosclerosis of unspecified type of bypass graft(s) of the left leg with ulceration of calf: Secondary | ICD-10-CM | POA: Diagnosis not present

## 2015-03-06 ENCOUNTER — Other Ambulatory Visit: Payer: Self-pay | Admitting: Vascular Surgery

## 2015-03-06 DIAGNOSIS — E119 Type 2 diabetes mellitus without complications: Secondary | ICD-10-CM | POA: Diagnosis not present

## 2015-03-06 DIAGNOSIS — I739 Peripheral vascular disease, unspecified: Secondary | ICD-10-CM | POA: Diagnosis not present

## 2015-03-07 DIAGNOSIS — L97221 Non-pressure chronic ulcer of left calf limited to breakdown of skin: Secondary | ICD-10-CM | POA: Diagnosis not present

## 2015-03-07 DIAGNOSIS — L97229 Non-pressure chronic ulcer of left calf with unspecified severity: Secondary | ICD-10-CM | POA: Diagnosis not present

## 2015-03-07 DIAGNOSIS — I70342 Atherosclerosis of unspecified type of bypass graft(s) of the left leg with ulceration of calf: Secondary | ICD-10-CM | POA: Diagnosis not present

## 2015-03-07 DIAGNOSIS — E1151 Type 2 diabetes mellitus with diabetic peripheral angiopathy without gangrene: Secondary | ICD-10-CM | POA: Diagnosis not present

## 2015-03-07 DIAGNOSIS — E1165 Type 2 diabetes mellitus with hyperglycemia: Secondary | ICD-10-CM | POA: Diagnosis not present

## 2015-03-08 DIAGNOSIS — L97229 Non-pressure chronic ulcer of left calf with unspecified severity: Secondary | ICD-10-CM | POA: Diagnosis not present

## 2015-03-08 DIAGNOSIS — E1151 Type 2 diabetes mellitus with diabetic peripheral angiopathy without gangrene: Secondary | ICD-10-CM | POA: Diagnosis not present

## 2015-03-08 DIAGNOSIS — I70342 Atherosclerosis of unspecified type of bypass graft(s) of the left leg with ulceration of calf: Secondary | ICD-10-CM | POA: Diagnosis not present

## 2015-03-08 DIAGNOSIS — L97221 Non-pressure chronic ulcer of left calf limited to breakdown of skin: Secondary | ICD-10-CM | POA: Diagnosis not present

## 2015-03-09 DIAGNOSIS — I70342 Atherosclerosis of unspecified type of bypass graft(s) of the left leg with ulceration of calf: Secondary | ICD-10-CM | POA: Diagnosis not present

## 2015-03-09 DIAGNOSIS — E1151 Type 2 diabetes mellitus with diabetic peripheral angiopathy without gangrene: Secondary | ICD-10-CM | POA: Diagnosis not present

## 2015-03-09 DIAGNOSIS — L97221 Non-pressure chronic ulcer of left calf limited to breakdown of skin: Secondary | ICD-10-CM | POA: Diagnosis not present

## 2015-03-09 DIAGNOSIS — L97229 Non-pressure chronic ulcer of left calf with unspecified severity: Secondary | ICD-10-CM | POA: Diagnosis not present

## 2015-03-10 DIAGNOSIS — I70342 Atherosclerosis of unspecified type of bypass graft(s) of the left leg with ulceration of calf: Secondary | ICD-10-CM | POA: Diagnosis not present

## 2015-03-10 DIAGNOSIS — E1151 Type 2 diabetes mellitus with diabetic peripheral angiopathy without gangrene: Secondary | ICD-10-CM | POA: Diagnosis not present

## 2015-03-10 DIAGNOSIS — L97229 Non-pressure chronic ulcer of left calf with unspecified severity: Secondary | ICD-10-CM | POA: Diagnosis not present

## 2015-03-10 DIAGNOSIS — L97221 Non-pressure chronic ulcer of left calf limited to breakdown of skin: Secondary | ICD-10-CM | POA: Diagnosis not present

## 2015-03-11 ENCOUNTER — Ambulatory Visit
Admission: RE | Admit: 2015-03-11 | Discharge: 2015-03-11 | Disposition: A | Discharge status: Home / Self Care | Payer: Medicare Other | Source: Ambulatory Visit | Attending: Vascular Surgery | Admitting: Vascular Surgery

## 2015-03-11 ENCOUNTER — Encounter
Admission: RE | Disposition: A | Discharge status: Home / Self Care | Payer: Self-pay | Source: Ambulatory Visit | Attending: Vascular Surgery

## 2015-03-11 DIAGNOSIS — I70248 Atherosclerosis of native arteries of left leg with ulceration of other part of lower left leg: Secondary | ICD-10-CM | POA: Diagnosis not present

## 2015-03-11 DIAGNOSIS — Z90710 Acquired absence of both cervix and uterus: Secondary | ICD-10-CM | POA: Diagnosis not present

## 2015-03-11 DIAGNOSIS — I10 Essential (primary) hypertension: Secondary | ICD-10-CM | POA: Insufficient documentation

## 2015-03-11 DIAGNOSIS — Z87891 Personal history of nicotine dependence: Secondary | ICD-10-CM | POA: Insufficient documentation

## 2015-03-11 DIAGNOSIS — Z79899 Other long term (current) drug therapy: Secondary | ICD-10-CM | POA: Insufficient documentation

## 2015-03-11 DIAGNOSIS — I70242 Atherosclerosis of native arteries of left leg with ulceration of calf: Secondary | ICD-10-CM | POA: Diagnosis not present

## 2015-03-11 DIAGNOSIS — L97229 Non-pressure chronic ulcer of left calf with unspecified severity: Secondary | ICD-10-CM | POA: Diagnosis not present

## 2015-03-11 DIAGNOSIS — E119 Type 2 diabetes mellitus without complications: Secondary | ICD-10-CM | POA: Diagnosis not present

## 2015-03-11 DIAGNOSIS — E785 Hyperlipidemia, unspecified: Secondary | ICD-10-CM | POA: Insufficient documentation

## 2015-03-11 DIAGNOSIS — Z7982 Long term (current) use of aspirin: Secondary | ICD-10-CM | POA: Insufficient documentation

## 2015-03-11 HISTORY — PX: PERIPHERAL VASCULAR CATHETERIZATION: SHX172C

## 2015-03-11 LAB — BASIC METABOLIC PANEL
ANION GAP: 6 (ref 5–15)
BUN: 57 mg/dL — ABNORMAL HIGH (ref 6–20)
CALCIUM: 9.1 mg/dL (ref 8.9–10.3)
CO2: 19 mmol/L — ABNORMAL LOW (ref 22–32)
Chloride: 112 mmol/L — ABNORMAL HIGH (ref 101–111)
Creatinine, Ser: 2.27 mg/dL — ABNORMAL HIGH (ref 0.44–1.00)
GFR, EST AFRICAN AMERICAN: 24 mL/min — AB (ref >60)
GFR, EST NON AFRICAN AMERICAN: 21 mL/min — AB (ref >60)
GLUCOSE: 144 mg/dL — AB (ref 65–99)
Potassium: 5.8 mmol/L — ABNORMAL HIGH (ref 3.5–5.1)
SODIUM: 137 mmol/L (ref 135–145)

## 2015-03-11 SURGERY — LOWER EXTREMITY ANGIOGRAPHY
Anesthesia: Moderate Sedation | Laterality: Left

## 2015-03-11 MED ORDER — ONDANSETRON HCL 4 MG/2ML IJ SOLN: 4.0000 mg | Freq: Four times a day (QID) | INTRAMUSCULAR | Status: DC | PRN

## 2015-03-11 MED ORDER — SODIUM CHLORIDE 0.9 % IV SOLN: 500.0000 mL | Freq: Once | INTRAVENOUS | Status: DC | PRN

## 2015-03-11 MED ORDER — HEPARIN (PORCINE) IN NACL 2-0.9 UNIT/ML-% IJ SOLN
INTRAMUSCULAR | Status: AC
  Filled 2015-03-11: qty 1000

## 2015-03-11 MED ORDER — ACETAMINOPHEN 325 MG RE SUPP: 325.0000 mg | RECTAL | Status: DC | PRN

## 2015-03-11 MED ORDER — HEPARIN SODIUM (PORCINE) 1000 UNIT/ML IJ SOLN
INTRAMUSCULAR | Status: AC
  Filled 2015-03-11: qty 1

## 2015-03-11 MED ORDER — HYDROMORPHONE HCL 1 MG/ML IJ SOLN
0.5000 mg | INTRAMUSCULAR | Status: DC | PRN
  Administered 2015-03-11: 1 mg via INTRAVENOUS

## 2015-03-11 MED ORDER — METOPROLOL TARTRATE 1 MG/ML IV SOLN: 2.0000 mg | INTRAVENOUS | Status: DC | PRN

## 2015-03-11 MED ORDER — SODIUM BICARBONATE 8.4 % IV SOLN
INTRAVENOUS | Status: DC
  Filled 2015-03-11: qty 500

## 2015-03-11 MED ORDER — CLINDAMYCIN PHOSPHATE 300 MG/50ML IV SOLN: 300.0000 mg | Freq: Once | INTRAVENOUS | Status: DC

## 2015-03-11 MED ORDER — ACETAMINOPHEN 325 MG PO TABS: 325.0000 mg | ORAL_TABLET | ORAL | Status: DC | PRN

## 2015-03-11 MED ORDER — MIDAZOLAM HCL 5 MG/5ML IJ SOLN
INTRAMUSCULAR | Status: AC
  Filled 2015-03-11: qty 5

## 2015-03-11 MED ORDER — MIDAZOLAM HCL 2 MG/2ML IJ SOLN
INTRAMUSCULAR | Status: DC | PRN
  Administered 2015-03-11: 1 mg via INTRAVENOUS
  Administered 2015-03-11: 2 mg via INTRAVENOUS

## 2015-03-11 MED ORDER — LABETALOL HCL 5 MG/ML IV SOLN: 10.0000 mg | INTRAVENOUS | Status: DC | PRN

## 2015-03-11 MED ORDER — SODIUM CHLORIDE 0.9 % IV SOLN
INTRAVENOUS | Status: DC
  Administered 2015-03-11: 10:00:00 via INTRAVENOUS

## 2015-03-11 MED ORDER — HEPARIN SODIUM (PORCINE) 1000 UNIT/ML IJ SOLN
INTRAMUSCULAR | Status: DC | PRN
  Administered 2015-03-11: 5000 [IU] via INTRAVENOUS

## 2015-03-11 MED ORDER — FENTANYL CITRATE (PF) 100 MCG/2ML IJ SOLN
INTRAMUSCULAR | Status: AC
  Filled 2015-03-11: qty 2

## 2015-03-11 MED ORDER — LIDOCAINE-EPINEPHRINE (PF) 1 %-1:200000 IJ SOLN
INTRAMUSCULAR | Status: AC
  Filled 2015-03-11: qty 30

## 2015-03-11 MED ORDER — GUAIFENESIN-DM 100-10 MG/5ML PO SYRP: 15.0000 mL | ORAL_SOLUTION | ORAL | Status: DC | PRN

## 2015-03-11 MED ORDER — HYDROMORPHONE HCL 1 MG/ML IJ SOLN
INTRAMUSCULAR | Status: AC
  Filled 2015-03-11: qty 1

## 2015-03-11 MED ORDER — FENTANYL CITRATE (PF) 100 MCG/2ML IJ SOLN
INTRAMUSCULAR | Status: DC | PRN
  Administered 2015-03-11 (×2): 50 ug via INTRAVENOUS

## 2015-03-11 MED ORDER — PHENOL 1.4 % MT LIQD: 1.0000 | OROMUCOSAL | Status: DC | PRN

## 2015-03-11 MED ORDER — LIDOCAINE-EPINEPHRINE (PF) 1 %-1:200000 IJ SOLN
INTRAMUSCULAR | Status: DC | PRN
  Administered 2015-03-11: 10 mL via INTRADERMAL

## 2015-03-11 MED ORDER — ASPIRIN 81 MG PO CHEW: 81.0000 mg | CHEWABLE_TABLET | Freq: Every day | ORAL | Status: DC

## 2015-03-11 MED ORDER — OXYCODONE-ACETAMINOPHEN 5-325 MG PO TABS: 1.0000 | ORAL_TABLET | ORAL | Status: DC | PRN

## 2015-03-11 MED ORDER — CLOPIDOGREL BISULFATE 75 MG PO TABS: 75.0000 mg | ORAL_TABLET | Freq: Every day | ORAL | Status: AC

## 2015-03-11 MED ORDER — SODIUM BICARBONATE BOLUS VIA INFUSION
INTRAVENOUS | Status: AC
  Administered 2015-03-11: 11:00:00 via INTRAVENOUS
  Filled 2015-03-11: qty 1

## 2015-03-11 MED ORDER — METHYLPREDNISOLONE SODIUM SUCC 125 MG IJ SOLR: 125.0000 mg | INTRAMUSCULAR | Status: DC | PRN

## 2015-03-11 MED ORDER — FAMOTIDINE 20 MG PO TABS: 40.0000 mg | ORAL_TABLET | ORAL | Status: DC | PRN

## 2015-03-11 MED ORDER — HYDRALAZINE HCL 20 MG/ML IJ SOLN: 5.0000 mg | INTRAMUSCULAR | Status: DC | PRN

## 2015-03-11 MED ORDER — CLINDAMYCIN PHOSPHATE 300 MG/50ML IV SOLN
INTRAVENOUS | Status: AC
  Administered 2015-03-11: 11:00:00
  Filled 2015-03-11: qty 50

## 2015-03-11 SURGICAL SUPPLY — 26 items
BALLN DORADO 7X40X80 (BALLOONS) ×2
BALLN LUTONIX 5X150X130 (BALLOONS) ×2
BALLN LUTONIX 6X150X130 (BALLOONS) ×2
BALLN ULTRVRSE 3X300X150 (BALLOONS) ×1
BALLN ULTRVRSE 3X300X150 OTW (BALLOONS) ×1
BALLN ULTRVRSE 6X300X130 (BALLOONS) ×2
BALLOON DORADO 7X40X80 (BALLOONS) ×1 IMPLANT
BALLOON LUTONIX 5X150X130 (BALLOONS) ×1 IMPLANT
BALLOON LUTONIX 6X150X130 (BALLOONS) ×1 IMPLANT
BALLOON ULTRVRSE 3X300X150 OTW (BALLOONS) ×1 IMPLANT
BALLOON ULTRVRSE 6X300X130 (BALLOONS) ×1 IMPLANT
CATH PIG 70CM (CATHETERS) ×2 IMPLANT
CATH VERT 100CM (CATHETERS) ×2 IMPLANT
DEVICE PRESTO INFLATION (MISCELLANEOUS) ×2 IMPLANT
DEVICE STARCLOSE SE CLOSURE (Vascular Products) ×2 IMPLANT
GLIDEWIRE ADV .035X260CM (WIRE) ×2 IMPLANT
PACK ANGIOGRAPHY (CUSTOM PROCEDURE TRAY) ×2 IMPLANT
SHEATH ANL 5FRX45 (SHEATH) ×2 IMPLANT
SHEATH ANL2 6FRX45 HC (SHEATH) ×2 IMPLANT
SHEATH BRITE TIP 5FRX11 (SHEATH) ×2 IMPLANT
STENT TIGRIS 6X100X120 (Permanent Stent) ×2 IMPLANT
STENT VIABAHN 6X250X120 (Permanent Stent) ×2 IMPLANT
SYR MEDRAD MARK V 150ML (SYRINGE) ×2 IMPLANT
TUBING CONTRAST HIGH PRESS 72 (TUBING) ×2 IMPLANT
WIRE G V18X300CM (WIRE) ×2 IMPLANT
WIRE J 3MM .035X145CM (WIRE) ×2 IMPLANT

## 2015-03-11 NOTE — H&P (Signed)
  [facility] History & Physical Update  The patient was interviewed and re-examined.  The patient's previous History and Physical has been reviewed and is unchanged.  There is no change in the plan of care. We plan to proceed with the scheduled procedure.  Majid Mccravy, MD  03/11/2015, 10:07 AM

## 2015-03-11 NOTE — Discharge Instructions (Signed)
Groin Insertion Instructions-If you lose feeling or develop tingling or pain in your leg or foot after the procedure, please walk around first.  If the discomfort does not improve , contact your physician and proceed to the nearest emergency room.  Loss of feeling in your leg might mean that a blockage has formed in the artery and this can be appropriately treated.  Limit your activity for the next two days after your procedure.  Avoid stooping, bending, heavy lifting or exertion as this may put pressure on the insertion site.  Resume normal activities in 48 hours.  You may shower after 24 hours but avoid excessive warm water and do not scrub the site.  Remove clear dressing in 48 hours.  If you have had a closure device inserted, do not soak in a tub bath or a hot tub for at least one week. ° °No driving for 48 hours after discharge.  After the procedure, check the insertion site occasionally.  If any oozing occurs or there is apparent swelling, firm pressure over the site will prevent a bruise from forming.  You can not hurt anything by pressing directly on the site.  The pressure stops the bleeding by allowing a small clot to form.  If the bleeding continues after the pressure has been applied for more than 15 minutes, call 911 or go to the nearest emergency room.   ° °The x-ray dye causes you to pass a considerate amount of urine.  For this reason, you will be asked to drink plenty of liquids after the procedure to prevent dehydration.  You may resume you regular diet.  Avoid caffeine products.   ° °For pain at the site of your procedure, take non-aspirin medicines such as Tylenol. ° °Medications: A. Hold Metformin for 48 hours if applicable.  B. Continue taking all your present medications at home unless your doctor prescribes any changes.Groin Insertion Instructions-If you lose feeling or develop tingling or pain in your leg or foot after the procedure, please walk around first.  If the discomfort does not  improve , contact your physician and proceed to the nearest emergency room.  Loss of feeling in your leg might mean that a blockage has formed in the artery and this can be appropriately treated.  Limit your activity for the next two days after your procedure.  Avoid stooping, bending, heavy lifting or exertion as this may put pressure on the insertion site.  Resume normal activities in 48 hours.  You may shower after 24 hours but avoid excessive warm water and do not scrub the site.  Remove clear dressing in 48 hours.  If you have had a closure device inserted, do not soak in a tub bath or a hot tub for at least one week. ° °No driving for 48 hours after discharge.  After the procedure, check the insertion site occasionally.  If any oozing occurs or there is apparent swelling, firm pressure over the site will prevent a bruise from forming.  You can not hurt anything by pressing directly on the site.  The pressure stops the bleeding by allowing a small clot to form.  If the bleeding continues after the pressure has been applied for more than 15 minutes, call 911 or go to the nearest emergency room.   ° °The x-ray dye causes you to pass a considerate amount of urine.  For this reason, you will be asked to drink plenty of liquids after the procedure to prevent dehydration.  You   may resume you regular diet.  Avoid caffeine products.   ° °For pain at the site of your procedure, take non-aspirin medicines such as Tylenol. ° °Medications: A. Hold Metformin for 48 hours if applicable.  B. Continue taking all your present medications at home unless your doctor prescribes any changes. °

## 2015-03-11 NOTE — Op Note (Signed)
[facility] Percutaneous Study/Intervention Procedural Note   Date of Surgery: 03/11/2015  Surgeon(s):DEW,JASON   Assistants:none  Pre-operative Diagnosis: PAD with ulceration left lower extremity  Post-operative diagnosis: Same  Procedure(s) Performed: 1. Ultrasound guidance for vascular access right femoral artery 2. Catheter placement into left posterior tibial artery from right femoral approach 3. Aortogram and selective left lower extremity angiogram 4. Percutaneous transluminal angioplasty of left posterior tibial artery and tibioperoneal trunk with 3 mm diameter angioplasty balloon 5. Percutaneous transluminal angioplasty of above-knee popliteal artery and almost entire SFA including 2 inflations with 5 mm diameter by 15 cm length Lutonix drug-coated angioplasty balloon first to the popliteal artery then to the mid to distal superficial femoral artery and then using a 6 mm diameter by 15 cm length Lutonix drug-coated angioplasty balloon for the proximal to mid SFA.  6.  Stent placement to the SFA and popliteal artery for multiple areas of greater than 50% residual stenosis after angioplasty using a 6 mm diameter by 25 cm length Viabahn stent in the proximal to mid SFA to Hunter's canal and then using a 6 mm diameter by 10 cm length Tigris stent to the popliteal artery 7. StarClose closure device right femoral artery  EBL: 20 cc  Contrast: 80 cc  Fluro Time: 8 minutes  Conscious Sedation Time: approximately 60 minutes using 3 mg of Versed and 100 mcg of Fentanyl  Indications: Patient is a 70 y.o.female with severe peripheral arterial disease with an ulceration of the left foot. She has undergone previous intervention to that left leg.. The patient has noninvasive study showing occlusion of her popliteal artery and the stent and tibial disease. The patient is  brought in for angiography for further evaluation and potential treatment. Risks and benefits are discussed and informed consent is obtained  Procedure: The patient was identified and appropriate procedural time out was performed. The patient was then placed supine on the table and prepped and draped in the usual sterile fashion.Moderate conscious sedation was administered throughout the procedure with my supervision of the RN administering medicines and monitoring the patient's vital signs, pulse oximetry, telemetry and mental status throughout from the start of the procedure until the patient was taken to the recovery room. Ultrasound was used to evaluate the right common femoral artery. It was patent . A digital ultrasound image was acquired. A Seldinger needle was used to access the right common femoral artery under direct ultrasound guidance and a permanent image was performed. A 0.035 J wire was advanced without resistance and a 5Fr sheath was placed. Pigtail catheter was placed into the aorta and an AP aortogram was performed. This demonstrated normal renal arteries and normal aorta and iliac segments without significant stenosis. I then crossed the aortic bifurcation and advanced to the left femoral head. Selective left lower extremity angiogram was then performed. This demonstrated high-grade stenosis of the proximal SFA above and at the top of the previously placed stent. There was significant narrowing within the stent with occlusion in the mid stent and reconstitution in the popliteal artery below the stent. There was then an anterior tibial artery which was patent to the mid to distal calf but occluded. The posterior tibial artery had a high-grade stenosis proximally and up into the tibioperoneal trunk but was then continuous to the foot. The patient was systemically heparinized and a 6 [place] Ansell sheath was then placed over the [employer] wire. I then used a Kumpe catheter and the  advantage wire to navigate  through the SFA and popliteal occlusion and confirm intraluminal flow in the popliteal artery at the knee. I then exchanged for a 0.018 wire and navigated down the tibioperoneal trunk and posterior tibial artery through the stenosis and parked the wire in the foot. I then elected to proceed with treatment. A 3 mm diameter by 30 cm length angioplasty balloon was inflated in the posterior tibial artery in its mid and proximal segment up through the tibioperoneal trunk into the below-knee popliteal artery. This was inflated to 6 atm for 1 minute, and completion angiogram showed markedly improved flow about a 40% residual stenosis in the tibioperoneal trunk and less than 20% stenosis in the posterior tibial artery. I then turned my attention to the SFA and popliteal lesions. I first selected a 5 mm diameter by 15 cm length Lutonix drug-coated angioplasty balloon and my first inflation was in the popliteal artery from just above the knee up near Hunter's canal. A second inflation was performed with this balloon in the mid and distal superficial femoral artery. These inflations were to 12 atm. For the proximal to mid SFA, I upsized to a 6 mm diameter by 15 cm length Lutonix drug-coated angioplasty balloon. This was inflated to 8 atm for 1 minute. Angiogram following these balloon inflation showed a high-grade stenosis residual at the leading edge of the stent in the proximal SFA, moderate residual disease in the mid portions of the stent, and a moderate to high-grade stenosis at the distal reentry point just below the previous stent in the above-knee popliteal artery. I elected to cover these areas with stents. For the proximal portion I used a 6 mm diameter by 25 cm length Viabahn stent from the proximal SFA down to about Hunter's canal. I then used a noncovered Vinings stent for the popliteal artery extending this about 2-3 cm below the previous stent but remaining in the above-knee popliteal  artery. These were postdilated with 6 mm balloon. In the proximal SFA at the leading edge of the previous stent there still remained some residual narrowing so I upsized to a 7 mm diameter high pressure balloon and inflated this to 26 atm within the stent. Completion angiogram following stent placement showed about a 20% residual stenosis in the proximal SFA, about a 15% residual stenosis in the popliteal artery, and no other residual stenosis identified. I elected to terminate the procedure. The sheath was removed and StarClose closure device was deployed in the left femoral artery with excellent hemostatic result. The patient was taken to the recovery room in stable condition having tolerated the procedure well.  Findings:  Aortogram: Renal arteries appeared normal. Aorta and iliac segments were normal without significant stenosis Left Lower Extremity:high-grade stenosis of the proximal SFA above and at the top of the previously placed stent. There was significant narrowing within the stent with occlusion in the mid stent and reconstitution in the popliteal artery below the stent. There was then an anterior tibial artery which was patent to the mid to distal calf but occluded. The posterior tibial artery had a high-grade stenosis proximally and up into the tibioperoneal trunk but was then continuous to the foot.   Disposition: Patient was taken to the recovery room in stable condition having tolerated the procedure well.  Complications: None  DEW,JASON 03/11/2015 11:29 AM

## 2015-03-12 ENCOUNTER — Encounter: Payer: Self-pay | Admitting: Vascular Surgery

## 2015-03-12 DIAGNOSIS — L97221 Non-pressure chronic ulcer of left calf limited to breakdown of skin: Secondary | ICD-10-CM | POA: Diagnosis not present

## 2015-03-12 DIAGNOSIS — E1151 Type 2 diabetes mellitus with diabetic peripheral angiopathy without gangrene: Secondary | ICD-10-CM | POA: Diagnosis not present

## 2015-03-12 DIAGNOSIS — L97229 Non-pressure chronic ulcer of left calf with unspecified severity: Secondary | ICD-10-CM | POA: Diagnosis not present

## 2015-03-12 DIAGNOSIS — I70342 Atherosclerosis of unspecified type of bypass graft(s) of the left leg with ulceration of calf: Secondary | ICD-10-CM | POA: Diagnosis not present

## 2015-03-13 ENCOUNTER — Encounter: Payer: Medicare Other | Admitting: Surgery

## 2015-03-13 DIAGNOSIS — M1990 Unspecified osteoarthritis, unspecified site: Secondary | ICD-10-CM | POA: Diagnosis not present

## 2015-03-13 DIAGNOSIS — G629 Polyneuropathy, unspecified: Secondary | ICD-10-CM | POA: Diagnosis not present

## 2015-03-13 DIAGNOSIS — M79605 Pain in left leg: Secondary | ICD-10-CM | POA: Diagnosis not present

## 2015-03-13 DIAGNOSIS — E11610 Type 2 diabetes mellitus with diabetic neuropathic arthropathy: Secondary | ICD-10-CM | POA: Diagnosis not present

## 2015-03-13 DIAGNOSIS — Z880 Allergy status to penicillin: Secondary | ICD-10-CM | POA: Diagnosis not present

## 2015-03-13 DIAGNOSIS — R600 Localized edema: Secondary | ICD-10-CM | POA: Diagnosis not present

## 2015-03-13 DIAGNOSIS — E11622 Type 2 diabetes mellitus with other skin ulcer: Secondary | ICD-10-CM | POA: Diagnosis not present

## 2015-03-13 DIAGNOSIS — I70322 Atherosclerosis of unspecified type of bypass graft(s) of the extremities with rest pain, left leg: Secondary | ICD-10-CM | POA: Diagnosis not present

## 2015-03-13 DIAGNOSIS — I739 Peripheral vascular disease, unspecified: Secondary | ICD-10-CM | POA: Diagnosis not present

## 2015-03-13 DIAGNOSIS — I70342 Atherosclerosis of unspecified type of bypass graft(s) of the left leg with ulceration of calf: Secondary | ICD-10-CM | POA: Diagnosis not present

## 2015-03-13 DIAGNOSIS — I10 Essential (primary) hypertension: Secondary | ICD-10-CM | POA: Diagnosis not present

## 2015-03-13 DIAGNOSIS — I771 Stricture of artery: Secondary | ICD-10-CM | POA: Diagnosis not present

## 2015-03-13 NOTE — Progress Notes (Addendum)
VERNADETTE, STUTSMAN ([id number]) Visit Report for 03/13/2015 Chief Complaint Document Details Patient Name: [name], [name] 03/13/2015 12:45 Date of Service: PM Medical Record [id number] Number: Patient Account Number: [id number] 1945-07-12 (70 y.o. Treating RN: Clover Mealy, RN, BSN, [place] Sink Date of Birth/Sex: Female) Other Clinician: Primary Care Physician: Sherrie Mustache Treating Jie Stickels Referring Physician: Sherrie Mustache Physician/Extender: Tania Ade in Treatment: 3 Information Obtained from: Patient Chief Complaint Left calf ulcerations. Arterial insufficiency. Electronic Signature(s) Signed: 03/13/2015 1:25:41 PM By: Evlyn Kanner MD, FACS Entered By: Evlyn Kanner on 03/13/2015 13:25:40 NIKIE, CID ([id number]) -------------------------------------------------------------------------------- HPI Details Patient Name: [name] 03/13/2015 12:45 Date of Service: PM Medical Record [id number] Number: Patient Account Number: [id number] 1945-08-22 (70 y.o. Treating RN: Afful, RN, BSN, [place] Sink Date of Birth/Sex: Female) Other Clinician: Primary Care Physician: Sherrie Mustache Treating Shaquia Berkley Referring Physician: Sherrie Mustache Physician/Extender: Weeks in Treatment: 3 History of Present Illness HPI Description: Pleasant 70 year old with diabetes (no hemoglobin A1c available) and severe peripheral vascular disease. She reports a prior left lower extremity stent. Records unavailable. Underwent multiple level angioplasty of left lower extremity 10/29/2014 by Dr. Wyn Quaker. Developed left calf ulcerations in late December 2016. Denies any trauma. Seen by her PCP. Bactrim prescribed. Referred to the wound clinic. Biopsy culture 02/20/2015 grew methicillin sensitive staph aureus, sensitive to Bactrim. Performing dressing changes with Prisma. Using a Tubigrip for edema control. Scheduled to see Dr. Wyn Quaker in follow-up 02/25/2015. Cancelled because of weather. Rescheduled for 03/06/2015. She  returns to clinic for follow-up and is without new complaints. Pain improved. No ischemic rest pain. No fever or chills. Minimal drainage. 03/13/2015 -- records obtained from [facility] shows that she was seen by Dr. dew on 03/12/2015 and given tramadol and other symptomatic treatment. Recent ABI done showed right to be 0.94 and left to be 0.47 a left lower extremity arterial duplex was notable for an occluded stent and distal ATA. Recommendations were for left lower extremity angiogram with intervention. on 03/11/2015 she was taken up for a aortogram with angioplasty of the left posterior tibial artery and tibioperoneal trunk, above-knee popliteal artery and almost entire SFA, stent placement to the SFA and popliteal artery. Electronic Signature(s) Signed: 03/13/2015 1:27:29 PM By: Evlyn Kanner MD, FACS Previous Signature: 03/13/2015 10:21:31 AM Version By: Evlyn Kanner MD, FACS Entered By: Evlyn Kanner on 03/13/2015 13:27:29 ADRIAN, SPECHT ([id number]) -------------------------------------------------------------------------------- Physical Exam Details Patient Name: [name] 03/13/2015 12:45 Date of Service: PM Medical Record [id number] Number: Patient Account Number: [id number] 1945/08/11 (69 y.o. Treating RN: Clover Mealy, RN, BSN,  Sink Date of Birth/Sex: Female) Other Clinician: Primary Care Physician: Sherrie Mustache Treating Phyllis Abelson Referring Physician: Sherrie Mustache Physician/Extender: Weeks in Treatment: 3 Constitutional . Pulse regular. Respirations normal and unlabored. Afebrile. . Eyes Nonicteric. Reactive to light. Ears, Nose, Mouth, and Throat Lips, teeth, and gums WNL.Marland Kitchen Moist mucosa without lesions. Neck supple and nontender. No palpable supraclavicular or cervical adenopathy. Normal sized without goiter. Respiratory WNL. No retractions.. Cardiovascular Pedal Pulses WNL. No clubbing, cyanosis or edema. Chest Breasts symmetical and no nipple  discharge.. Breast tissue WNL, no masses, lumps, or tenderness.. Lymphatic No adneopathy. No adenopathy. No adenopathy. Musculoskeletal Adexa without tenderness or enlargement.. Digits and nails w/o clubbing, cyanosis, infection, petechiae, ischemia, or inflammatory conditions.. Integumentary (Hair, Skin) No suspicious lesions. No crepitus or fluctuance. No peri-wound warmth or erythema. No masses.Marland Kitchen Psychiatric Judgement and insight Intact.. No evidence of depression, anxiety, or agitation.. Notes the left anterior calf ulceration is completely healed and there is no open  ulceration. The left medial calf ulceration has thick necrotic full-thickness debris which was extremely tender and not possible to debride. We will use Santyl ointment on the same. Electronic Signature(s) Signed: 03/13/2015 1:28:16 PM By: Evlyn Kanner MD, FACS Entered By: Evlyn Kanner on 03/13/2015 13:28:15 MATILYNN, DACEY ([id number]) -------------------------------------------------------------------------------- Physician Orders Details Patient Name: [name] 03/13/2015 12:45 Date of Service: PM Medical Record [id number] Number: Patient Account Number: [id number] Jul 15, 1945 (69 y.o. Treating RN: Clover Mealy, RN, BSN, [place] Sink Date of Birth/Sex: Female) Other Clinician: Primary Care Physician: Sherrie Mustache Treating Kana Reimann Referring Physician: Sherrie Mustache Physician/Extender: Tania Ade in Treatment: 3 Verbal / Phone Orders: Yes Clinician: Afful, RN, BSN, Rita Read Back and Verified: Yes Diagnosis Coding Wound Cleansing Wound #2 Left,Medial Lower Leg o Clean wound with wound cleanser. Anesthetic Wound #2 Left,Medial Lower Leg o Topical Lidocaine 4% cream applied to wound bed prior to debridement Primary Wound Dressing Wound #2 Left,Medial Lower Leg o Santyl Ointment Secondary Dressing Wound #2 Left,Medial Lower Leg o Conform/Kerlix - telfa Dressing Change Frequency Wound #2 Left,Medial  Lower Leg o Change dressing every day. Follow-up Appointments Wound #2 Left,Medial Lower Leg o Return Appointment in 1 week. Edema Control Wound #2 Left,Medial Lower Leg o Elevate legs to the level of the heart and pump ankles as often as possible o Tubigrip Home Health Wound #2 Left,Medial Lower Leg o Initiate Home Health for Skilled Nursing o Home Health Nurse may visit PRN to address patientos wound care needs. HA, SHANNAHAN [place] ([id number]) o FACE TO FACE ENCOUNTER: MEDICARE and MEDICAID PATIENTS: I certify that this patient is under my care and that I had a face-to-face encounter that meets the physician face-to-face encounter requirements with this patient on this date. The encounter with the patient was in whole or in part for the following MEDICAL CONDITION: (primary reason for Home Healthcare) MEDICAL NECESSITY: I certify, that based on my findings, NURSING services are a medically necessary home health service. HOME BOUND STATUS: I certify that my clinical findings support that this patient is homebound (i.e., Due to illness or injury, pt requires aid of supportive devices such as crutches, cane, wheelchairs, walkers, the use of special transportation or the assistance of another person to leave their place of residence. There is a normal inability to leave the home and doing so requires considerable and taxing effort. Other absences are for medical reasons / religious services and are infrequent or of short duration when for other reasons). o If current dressing causes regression in wound condition, may D/C ordered dressing product/s and apply Normal Saline Moist Dressing daily until next Wound Healing Center / Other MD appointment. Notify Wound Healing Center of regression in wound condition at [phone]. o Please direct any NON-WOUND related issues/requests for orders to patient's Primary Care Physician Electronic Signature(s) Signed: 03/13/2015 4:12:51 PM  By: Evlyn Kanner MD, FACS Signed: 03/13/2015 5:22:56 PM By: Elpidio Eric BSN, RN Entered By: Elpidio Eric on 03/13/2015 13:14:11 Moster, [name] ([id number]) -------------------------------------------------------------------------------- Problem List Details Patient Name: TIANNI, ESCAMILLA 03/13/2015 12:45 Date of Service: PM Medical Record [id number] Number: Patient Account Number: [id number] 05-09-1945 (69 y.o. 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 Treating RN: Clover Mealy, RN, BSN, [place] Sink Date of Birth/Sex: Female) Other Clinician: Primary Care Physician: Sherrie Mustache Treating Kyeisha Janowicz Referring Physician: Sherrie Mustache Physician/Extender: Tania Ade in Treatment: 3 Subjective Chief Complaint Information obtained from Patient Left calf ulcerations. Arterial insufficiency. History of Present Illness (HPI) Pleasant 70 year old  with diabetes (no hemoglobin A1c available) and severe peripheral vascular disease. She reports a prior left lower extremity stent. Records unavailable. Underwent multiple level angioplasty of left lower extremity 10/29/2014 by Dr. Wyn Quaker. Developed left calf ulcerations in late December 2016. Denies any trauma. Seen by her PCP. Bactrim prescribed. Referred to the wound clinic. Biopsy culture 02/20/2015 grew methicillin sensitive staph aureus, sensitive to Bactrim. Performing dressing changes with Prisma. Using a Tubigrip for edema control. Scheduled to see Dr. Wyn Quaker in follow-up 02/25/2015. Cancelled because of weather. Rescheduled for 03/06/2015. She returns to clinic for follow-up and is without new complaints. Pain improved. No ischemic rest pain. No fever or chills. Minimal drainage. 03/13/2015 -- records obtained from [facility] shows that she was seen by Dr. dew on 03/12/2015 and given tramadol and other symptomatic treatment. Recent ABI done showed right to be 0.94 and left to be 0.47 a left lower extremity arterial duplex was notable for an occluded stent and distal ATA. Recommendations were for left lower extremity angiogram with intervention. on 03/11/2015 she was taken up for a aortogram with angioplasty of the left posterior tibial artery and tibioperoneal trunk, above-knee popliteal artery and almost entire SFA, stent placement to the SFA and popliteal artery. Objective Constitutional Pulse regular. Respirations normal and unlabored. Afebrile. Vitals Time Taken: 12:57 PM, Height: 67 in, Weight: 302 lbs, BMI: 47.3, Temperature: 97.8 F, Pulse: 68 [place], [place] N. ([id number]) bpm, Respiratory Rate: 18 breaths/min, Blood Pressure: 140/63 mmHg. Eyes Nonicteric. Reactive to light. Ears, Nose, Mouth, and Throat Lips, teeth, and gums WNL.Marland Kitchen Moist mucosa without lesions. Neck supple and nontender. No palpable supraclavicular or cervical adenopathy. Normal sized without  goiter. Respiratory WNL. No retractions.. Cardiovascular Pedal Pulses WNL. No clubbing, cyanosis or edema. Chest Breasts symmetical and no nipple discharge.. Breast tissue WNL, no masses, lumps, or tenderness.. Lymphatic No adneopathy. No adenopathy. No adenopathy. Musculoskeletal Adexa without tenderness or enlargement.. Digits and nails w/o clubbing, cyanosis, infection, petechiae, ischemia, or inflammatory conditions.Marland Kitchen Psychiatric Judgement and insight Intact.. No evidence of depression, anxiety, or agitation.. General Notes: the left anterior calf ulceration is completely healed and there is no open ulceration. The left medial calf ulceration has thick necrotic full-thickness debris which was extremely tender and not possible to debride. We will use Santyl ointment on the same. Integumentary (Hair, Skin) No suspicious lesions. No crepitus or fluctuance. No peri-wound warmth or erythema. No masses.. Wound #1 status is Open. Original cause of wound was Blister. The wound is located on the Left,Anterior Lower Leg. The wound measures 0cm length x 0cm width x 0cm depth; 0cm^2 area and 0cm^3 volume. The wound is limited to skin breakdown. There is no tunneling or undermining noted. There is a medium amount of serosanguineous drainage noted. The  wound margin is flat and intact. There is no granulation within the wound bed. There is no necrotic tissue within the wound bed. The periwound skin appearance exhibited: Localized Edema, Moist. The periwound skin appearance did not exhibit: Callus, Crepitus, Excoriation, Fluctuance, Friable, Induration, Rash, Scarring, Dry/Scaly, Maceration, Atrophie Blanche, Cyanosis, Ecchymosis, Hemosiderin Staining, Mottled, Pallor, Rubor, Erythema. Periwound temperature was noted as No Abnormality. Wound #2 status is Open. Original cause of wound was Blister. The wound is located on the Left,Medial Lower Leg. The wound measures 1.5cm length x 1.5cm width x 0.2cm  depth; 1.767cm^2 area and Amick, [place] N. ([id number]) 0.353cm^3 volume. The wound is limited to skin breakdown. There is no tunneling or undermining noted. There is a medium amount of serosanguineous drainage noted. The wound margin is flat and intact. There is no granulation within the wound bed. There is a large (67-100%) amount of necrotic tissue within the wound bed including Eschar and Adherent Slough. The periwound skin appearance exhibited: Localized Edema, Moist. The periwound skin appearance did not exhibit: Callus, Crepitus, Excoriation, Fluctuance, Friable, Induration, Rash, Scarring, Dry/Scaly, Maceration, Atrophie Blanche, Cyanosis, Ecchymosis, Hemosiderin Staining, Mottled, Pallor, Rubor, Erythema. The periwound has tenderness on palpation. 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 Now that her revascularization has been done her chances of healing this wound is much better and she and her caregiver understand this. Plan Wound Cleansing: Wound #2 Left,Medial Lower Leg: Clean wound with wound cleanser. Anesthetic: Wound #2 Left,Medial Lower Leg: Topical Lidocaine 4% cream applied to wound bed prior to debridement Primary Wound Dressing: Wound #2 Left,Medial Lower Leg: Santyl Ointment Secondary Dressing: Wound #2 Left,Medial Lower Leg: Conform/Kerlix - telfa Dressing Change Frequency: Wound #2 Left,Medial Lower Leg: Change dressing every day. KEALANI, LECKEY ([id number]) Follow-up Appointments: Wound #2 Left,Medial Lower  Leg: Return Appointment in 1 week. Edema Control: Wound #2 Left,Medial Lower Leg: Elevate legs to the level of the heart and pump ankles as often as possible Tubigrip Home Health: Wound #2 Left,Medial Lower Leg: Initiate Home Health for Skilled Nursing Home Health Nurse may visit PRN to address patient s wound care needs. FACE TO FACE ENCOUNTER: MEDICARE and MEDICAID PATIENTS: I certify that this patient is under my care and that I had a face-to-face encounter that meets the physician face-to-face encounter requirements with this patient on this date. The encounter with the patient was in whole or in part for the following MEDICAL CONDITION: (primary reason for Home Healthcare) MEDICAL NECESSITY: I certify, that based on my findings, NURSING services are a medically necessary home health service. HOME BOUND STATUS: I certify that my clinical findings support that this patient is homebound (i.e., Due to illness or injury, pt requires aid of supportive devices such as crutches, cane, wheelchairs, walkers, the use of special transportation or the assistance of another person to leave their place of residence. There is a normal inability to leave the home and doing so requires considerable and taxing effort. Other absences are for medical reasons / religious services and are infrequent or of short duration when for other reasons). If  current dressing causes regression in wound condition, may D/C ordered dressing product/s and apply Normal Saline Moist Dressing daily until next Wound Healing Center / Other MD appointment. Notify Wound Healing Center of regression in wound condition at [phone]. Please direct any NON-WOUND related issues/requests for orders to patient's Primary Care Physician We will continue to use Santyl ointment on the medial calf ulceration and once it is soft enough we can debride it in the office with a curette. Now that her revascularization has been done her chances  of healing this wound is much better and she and her caregiver understand this. Electronic Signature(s) Signed: 03/13/2015 1:29:44 PM By: Evlyn Kanner MD, FACS Entered By: Evlyn Kanner on 03/13/2015 13:29:44 SHARNIKA, BINNEY ([id number]) -------------------------------------------------------------------------------- SuperBill Details Patient Name: [name] Date of Service: 03/13/2015 Medical Record Patient Account Number: [id number] Number: Afful, RN, BSN, Treating RN: 1945-12-17 [phone] y.o. 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, left I70.322 leg Facility Procedures CPT4 Code: [id number] Description: 32440 - WOUND CARE VISIT-LEV 2 EST PT Modifier: Quantity: 1 Physician Procedures CPT4: Description Modifier Quantity Code [id number] - WC PHYS LEVEL 3 - EST PT 1 ICD-10 Description Diagnosis I73.9 Peripheral vascular disease, unspecified 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

## 2015-03-13 NOTE — Progress Notes (Addendum)
NYKIRA, REDDIX ([id number]) Visit Report for 03/13/2015 Arrival Information Details Patient Name: [name], [name] Date of Service: 03/13/2015 12:45 PM Medical Record Number: [id number] Patient Account Number: [id number] Date of Birth/Sex: [birth year] (70 y.o. Female) Treating RN: Afful, RN, BSN, [place] Primary Care Physician: Sherrie Mustache Other Clinician: Referring Physician: Sherrie Mustache Treating Physician/Extender: Rudene Re in Treatment: 3 Visit Information History Since Last Visit Added or deleted any medications: No Patient Arrived: Wheel Chair Any new allergies or adverse reactions: No Arrival Time: 12:55 Had a fall or experienced change in No Accompanied By: cousin activities of daily living that may affect Transfer Assistance: None risk of falls: Patient Identification Verified: Yes Signs or symptoms of abuse/neglect since last No Secondary Verification Process Yes visito Completed: Hospitalized since last visit: No Patient Requires Transmission- No Has Dressing in Place as Prescribed: Yes Based Precautions: Pain Present Now: No Patient Has Alerts: Yes Patient Alerts: Patient on Blood Thinner plavix, aspirin  DMII Electronic Signature(s) Signed: 03/13/2015 5:22:56 PM By: Elpidio Eric BSN, RN Entered By: Elpidio Eric on 03/13/2015 12:56:01 Hole, [name] ([id number]) -------------------------------------------------------------------------------- Clinic Level of Care Assessment Details Patient Name: [name] Date of Service: 03/13/2015 12:45 PM Medical Record Number: [id number] Patient Account Number: [id number] Date of Birth/Sex: September 25, 1945 (70 y.o. Female) Treating RN: Afful, RN, BSN, [place] Sink Primary Care Physician: Sherrie Mustache Other Clinician: Referring Physician: Sherrie Mustache Treating Physician/Extender: Rudene Re in Treatment: 3 Clinic Level of Care Assessment Items TOOL 4 Quantity Score  - Use when only an EandM is performed  on FOLLOW-UP visit 0 ASSESSMENTS - Nursing Assessment / Reassessment X - Reassessment of Co-morbidities (includes updates in patient status) 1 10 X - Reassessment of Adherence to Treatment Plan 1 5 ASSESSMENTS - Wound and Skin Assessment / Reassessment X - Simple Wound Assessment / Reassessment - one wound 1 5  - Complex Wound Assessment / Reassessment - multiple wounds 0  - Dermatologic / Skin Assessment (not related to wound area) 0 ASSESSMENTS - Focused Assessment  - Circumferential Edema Measurements - multi extremities 0  - Nutritional Assessment / Counseling / Intervention 0 X - Lower Extremity Assessment (monofilament, tuning fork, pulses) 1 5  - Peripheral Arterial Disease Assessment (using hand held doppler) 0 ASSESSMENTS - Ostomy and/or Continence Assessment and Care  - Incontinence Assessment and Management 0  - Ostomy Care Assessment and Management (repouching, etc.) 0 PROCESS - Coordination of Care X - Simple Patient / Family Education for ongoing care 1 15  - Complex (extensive) Patient / Family Education for ongoing care 0  - Staff obtains Chiropractor, Records, Test Results / Process Orders 0  - Staff telephones HHA, Nursing Homes / Clarify orders / etc 0  - Routine Transfer to another Facility (non-emergent condition) 0 Blea, Mellisa N. ([id number])  - Routine Hospital Admission (non-emergent condition) 0  - New Admissions / Manufacturing engineer / Ordering NPWT, Apligraf, etc. 0  - Emergency Hospital Admission (emergent condition) 0  - Simple Discharge Coordination 0  - Complex (extensive) Discharge Coordination 0 PROCESS - Special Needs  - Pediatric / Minor Patient Management 0  - Isolation Patient Management 0  - Hearing / Language / Visual special needs 0  - Assessment of Community assistance (transportation, D/C planning, etc.) 0  - Additional assistance / Altered mentation 0  - Support Surface(s) Assessment (bed,  cushion, seat, etc.) 0 INTERVENTIONS - Wound Cleansing / Measurement X - Simple Wound Cleansing - one wound 1 5  - Complex Wound Cleansing -  multiple wounds 0 X - Wound Imaging (photographs - any number of wounds) 1 5 []  - Wound Tracing (instead of photographs) 0 X - Simple Wound Measurement - one wound 1 5 []  - Complex Wound Measurement - multiple wounds 0 INTERVENTIONS - Wound Dressings X - Small Wound Dressing one or multiple wounds 1 10 []  - Medium Wound Dressing one or multiple wounds 0 []  - Large Wound Dressing one or multiple wounds 0 []  - Application of Medications - topical 0 []  - Application of Medications - injection 0 INTERVENTIONS - Miscellaneous []  - External ear exam 0 Campusano, Rachael N. ([id number]) []  - Specimen Collection (cultures, biopsies, blood, body fluids, etc.) 0 []  - Specimen(s) / Culture(s) sent or taken to Lab for analysis 0 []  - Patient Transfer (multiple staff / Michiel Sites Lift / Similar devices) 0 []  - Simple Staple / Suture removal (25 or less) 0 []  - Complex Staple / Suture removal (26 or more) 0 []  - Hypo / Hyperglycemic Management (close monitor of Blood Glucose) 0 []  - Ankle / Brachial Index (ABI) - do not check if billed separately 0 X - Vital Signs 1 5 Has the patient been seen at the hospital within the last three years: Yes Total Score: 70 Level Of Care: New/Established - Level 2 Electronic Signature(s) Signed: 03/13/2015 5:22:56 PM By: Elpidio Eric BSN, RN Entered By: Elpidio Eric on 03/13/2015 13:14:49 Burchill, [name] ([id number]) -------------------------------------------------------------------------------- Encounter Discharge Information Details Patient Name: [name] Date of Service: 03/13/2015 12:45 PM Medical Record Number: [id number] Patient Account Number: [id number] Date of Birth/Sex: 1945/12/04 (69 y.o. Female) Treating RN: Afful, RN, BSN, [place] Sink Primary Care Physician: Sherrie Mustache Other Clinician: Referring Physician: Sherrie Mustache Treating Physician/Extender: Rudene Re in Treatment: 3 Encounter Discharge Information Items Discharge Pain Level: 0 Discharge Condition: Stable Ambulatory Status: Wheelchair Discharge Destination: Home Transportation: Private Auto Accompanied By: cousin Schedule Follow-up Appointment: No Medication Reconciliation completed and provided to Patient/Care No Alpheus Stiff: Provided on Clinical Summary of Care: 03/13/2015 Form Type Recipient Paper Patient MW Electronic Signature(s) Signed: 03/13/2015 1:22:59 PM By: Gwenlyn Perking Entered By: Gwenlyn Perking on 03/13/2015 13:22:59 Chastain, [name] ([id number]) -------------------------------------------------------------------------------- Lower Extremity Assessment Details Patient Name: [name] Date of Service: 03/13/2015 12:45 PM Medical Record Number: [id number] Patient Account Number: [id number] Date of Birth/Sex: Sep 23, 1945 (69 y.o. Female) Treating RN: Afful, RN, BSN, [place] Sink Primary Care Physician: Sherrie Mustache Other Clinician: Referring Physician: Sherrie Mustache Treating Physician/Extender: Rudene Re in Treatment: 3 Vascular Assessment Pulses: Posterior Tibial Dorsalis Pedis Palpable: [Left:Yes] Extremity colors, hair growth, and conditions: Extremity Color: [Left:Normal] Hair Growth on Extremity: [Left:No] Temperature of Extremity: [Left:Warm] Capillary Refill: [Left:< 3 seconds] Toe Nail Assessment Left: Right: Thick: Yes Discolored: Yes Deformed: No Improper Length and Hygiene: Yes Electronic Signature(s) Signed: 03/13/2015 5:22:56 PM By: Elpidio Eric BSN, RN Entered By: Elpidio Eric on 03/13/2015 13:21:40 Kalp, [name] ([id number]) -------------------------------------------------------------------------------- Multi Wound Chart Details Patient Name: [name] Date of Service: 03/13/2015 12:45 PM Medical Record Number: [id number] Patient Account Number: [id number] Date of Birth/Sex:  1945-08-02 (70 y.o. Female) Treating RN: Clover Mealy, RN, BSN, [place] Sink Primary Care Physician: Sherrie Mustache Other Clinician: Referring Physician: Sherrie Mustache Treating Physician/Extender: Rudene Re in Treatment: 3 Vital Signs Height(in): 67 Pulse(bpm): 68 Weight(lbs): 302 Blood Pressure 140/63 (mmHg): Body Mass Index(BMI): 47 Temperature(F): 97.8 Respiratory Rate 18 (breaths/min): Photos: [1:No Photos] [2:No Photos] [N/A:N/A] Wound Location: [1:Left Lower Leg - Anterior] [2:Left Lower Leg - Medial] [N/A:N/A] Wounding Event: [1:Blister] [2:Blister] [N/A:N/A] Primary  Etiology: [1:Venous Leg Ulcer] [2:Venous Leg Ulcer] [N/A:N/A] Comorbid History: [1:Hypertension, Peripheral Arterial Disease, Type II Diabetes, Osteoarthritis, Neuropathy] [2:Hypertension, Peripheral Arterial Disease, Type II Diabetes, Osteoarthritis, Neuropathy] [N/A:N/A] Date Acquired: [1:01/28/2015] [2:01/28/2015] [N/A:N/A] Weeks of Treatment: [1:3] [2:3] [N/A:N/A] Wound Status: [1:Open] [2:Open] [N/A:N/A] Clustered Wound: [1:Yes] [2:No] [N/A:N/A] Measurements L x W x D 0x0x0 [2:1.5x1.5x0.2] [N/A:N/A] (cm) Area (cm) : [1:0] [2:1.767] [N/A:N/A] Volume (cm) : [1:0] [2:0.353] [N/A:N/A] % Reduction in Area: [1:100.00%] [2:-212.70%] [N/A:N/A] % Reduction in Volume: 100.00% [2:-212.40%] [N/A:N/A] Classification: [1:Partial Thickness] [2:Unclassifiable] [N/A:N/A] HBO Classification: [1:Grade 1] [2:Grade 1] [N/A:N/A] Exudate Amount: [1:Medium] [2:Medium] [N/A:N/A] Exudate Type: [1:Serosanguineous] [2:Serosanguineous] [N/A:N/A] Exudate Color: [1:red, brown] [2:red, brown] [N/A:N/A] Wound Margin: [1:Flat and Intact] [2:Flat and Intact] [N/A:N/A] Granulation Amount: [1:None Present (0%)] [2:None Present (0%)] [N/A:N/A] Necrotic Amount: [1:None Present (0%)] [2:Large (67-100%)] [N/A:N/A] Necrotic Tissue: [1:N/A] [2:Eschar, Adherent Slough] [N/A:N/A] Exposed Structures: [1:Fascia: No Fat: No Tendon: No]  [2:Fascia: No Fat: No Tendon: No] [N/A:N/A] Muscle: No Muscle: No Joint: No Joint: No Bone: No Bone: No Limited to Skin Limited to Skin Breakdown Breakdown Epithelialization: None None N/A Periwound Skin Texture: Edema: Yes Edema: Yes N/A Excoriation: No Excoriation: No Induration: No Induration: No Callus: No Callus: No Crepitus: No Crepitus: No Fluctuance: No Fluctuance: No Friable: No Friable: No Rash: No Rash: No Scarring: No Scarring: No Periwound Skin Moist: Yes Moist: Yes N/A Moisture: Maceration: No Maceration: No Dry/Scaly: No Dry/Scaly: No Periwound Skin Color: Atrophie Blanche: No Atrophie Blanche: No N/A Cyanosis: No Cyanosis: No Ecchymosis: No Ecchymosis: No Erythema: No Erythema: No Hemosiderin Staining: No Hemosiderin Staining: No Mottled: No Mottled: No Pallor: No Pallor: No Rubor: No Rubor: No Temperature: No Abnormality N/A N/A Tenderness on No Yes N/A Palpation: Wound Preparation: Ulcer Cleansing: Ulcer Cleansing: N/A Rinsed/Irrigated with Rinsed/Irrigated with Saline Saline Topical Anesthetic Topical Anesthetic Applied: None Applied: Other: lidocaine 4% Treatment Notes Electronic Signature(s) Signed: 03/13/2015 5:22:56 PM By: Elpidio Eric BSN, RN Entered By: Elpidio Eric on 03/13/2015 13:12:23 ARMELLA, STOGNER ([id number]) -------------------------------------------------------------------------------- Multi-Disciplinary Care Plan Details Patient Name: [name] Date of Service: 03/13/2015 12:45 PM Medical Record Number: [id number] Patient Account Number: [id number] Date of Birth/Sex: 07-29-1945 (70 y.o. Female) Treating RN: Afful, RN, BSN, [place] Sink Primary Care Physician: Sherrie Mustache Other Clinician: Referring Physician: Sherrie Mustache Treating Physician/Extender: Rudene Re in Treatment: 3 Active Inactive Abuse / Safety / Falls / Self Care Management Nursing Diagnoses: Potential for falls Goals: Patient will  remain injury free Date Initiated: 02/20/2015 Goal Status: Active Interventions: Assess fall risk on admission and as needed Notes: Nutrition Nursing Diagnoses: Potential for alteratiion in Nutrition/Potential for imbalanced nutrition Goals: Patient/caregiver will maintain therapeutic glucose control Date Initiated: 02/20/2015 Goal Status: Active Interventions: Assess patient nutrition upon admission and as needed per policy Notes: Orientation to the Wound Care Program Nursing Diagnoses: Knowledge deficit related to the wound healing center program Goals: Patient/caregiver will verbalize understanding of the Wound Healing Center Program Date Initiated: 02/20/2015 ROSA, WYLY ([id number]) Goal Status: Active Interventions: Provide education on orientation to the wound center Notes: Wound/Skin Impairment Nursing Diagnoses: Impaired tissue integrity Goals: Ulcer/skin breakdown will heal within 14 weeks Date Initiated: 02/20/2015 Goal Status: Active Interventions: Assess ulceration(s) every visit Notes: Electronic Signature(s) Signed: 03/13/2015 5:22:56 PM By: Elpidio Eric BSN, RN Entered By: Elpidio Eric on 03/13/2015 13:11:36 Garoutte, [name] ([id number]) -------------------------------------------------------------------------------- Pain Assessment Details Patient Name: [name] Date of Service: 03/13/2015 12:45 PM Medical Record Number: [id number] Patient Account Number: [id number] Date of Birth/Sex: Jan 01, 1946 (70 y.o. Female)  Treating RN: Clover Mealy, RN, BSN, [place] Sink Primary Care Physician: Sherrie Mustache Other Clinician: Referring Physician: Sherrie Mustache Treating Physician/Extender: Rudene Re in Treatment: 3 Active Problems Location of Pain Severity and Description of Pain Patient Has Paino No Site Locations Pain Management and Medication Current Pain Management: Electronic Signature(s) Signed: 03/13/2015 12:53:07 PM By: Elpidio Eric BSN, RN Entered By:  Elpidio Eric on 03/13/2015 12:53:07 Landess, [name] ([id number]) -------------------------------------------------------------------------------- Patient/Caregiver Education Details Patient Name: [name] Date of Service: 03/13/2015 12:45 PM Medical Record Number: [id number] Patient Account Number: [id number] Date of Birth/Gender: 09-15-1945 (70 y.o. Female) Treating RN: Clover Mealy, RN, BSN, [place] Sink Primary Care Physician: Sherrie Mustache Other Clinician: Referring Physician: Sherrie Mustache Treating Physician/Extender: Rudene Re in Treatment: 3 Education Assessment Education Provided To: Patient Education Topics Provided Welcome To The Wound Care Center: Methods: Explain/Verbal Responses: State content correctly Electronic Signature(s) Signed: 03/13/2015 5:22:56 PM By: Elpidio Eric BSN, RN Entered By: Elpidio Eric on 03/13/2015 13:15:03 Tarry, [name] ([id number]) -------------------------------------------------------------------------------- Wound Assessment Details Patient Name: [name] Date of Service: 03/13/2015 12:45 PM Medical Record Number: [id number] Patient Account Number: [id number] Date of Birth/Sex: 17-Sep-1945 (70 y.o. Female) Treating RN: Afful, RN, BSN, [place] Primary Care Physician: Sherrie Mustache Other Clinician: Referring Physician: Sherrie Mustache Treating Physician/Extender: Rudene Re in Treatment: 3 Wound Status Wound Number: 1 Primary Venous Leg Ulcer Etiology: Wound Location: Left Lower Leg - Anterior Wound Open Wounding Event: Blister Status: Date Acquired: 01/28/2015 Comorbid Hypertension, Peripheral Arterial Weeks Of Treatment: 3 History: Disease, Type II Diabetes, Clustered Wound: Yes Osteoarthritis, Neuropathy Photos Photo Uploaded By: Elpidio Eric on 03/13/2015 16:25:27 Wound Measurements Length: (cm) 0 % Reduction Width: (cm) 0 % Reduction Depth: (cm) 0 Epithelializ Area: (cm) 0 Tunneling: Volume: (cm) 0  Undermining in Area: 100% in Volume: 100% ation: None No : No Wound Description Classification: Partial Thickness Diabetic Severity [address]): Grade 1 Wound Margin: Flat and Intact Exudate Amount: Medium LADAYSHA, SOUTAR ([id number]) Foul Odor After Cleansing: No Exudate Type: Serosanguineous Exudate Color: red, brown Wound Bed Granulation Amount: None Present (0%) Exposed Structure Necrotic Amount: None Present (0%) Fascia Exposed: No Fat Layer Exposed: No Tendon Exposed: No Muscle Exposed: No Joint Exposed: No Bone Exposed: No Limited to Skin Breakdown Periwound Skin Texture Texture Color No Abnormalities Noted: No No Abnormalities Noted: No Callus: No Atrophie Blanche: No Crepitus: No Cyanosis: No Excoriation: No Ecchymosis: No Fluctuance: No Erythema: No Friable: No Hemosiderin Staining: No Induration: No Mottled: No Localized Edema: Yes Pallor: No Rash: No Rubor: No Scarring: No Temperature / Pain Moisture Temperature: No Abnormality No Abnormalities Noted: No Dry / Scaly: No Maceration: No Moist: Yes Wound Preparation Ulcer Cleansing: Rinsed/Irrigated with Saline Topical Anesthetic Applied: None Electronic Signature(s) Signed: 03/13/2015 5:22:56 PM By: Elpidio Eric BSN, RN Entered By: Elpidio Eric on 03/13/2015 13:03:13 Krider, [name] ([id number]) -------------------------------------------------------------------------------- Wound Assessment Details Patient Name: [name] Date of Service: 03/13/2015 12:45 PM Medical Record Number: [id number] Patient Account Number: [id number] Date of Birth/Sex: 1945/08/26 (69 y.o. Female) Treating RN: Afful, RN, BSN, [place] Sink Primary Care Physician: Sherrie Mustache Other Clinician: Referring Physician: Sherrie Mustache Treating Physician/Extender: Rudene Re in Treatment: 3 Wound Status Wound Number: 2 Primary Venous Leg Ulcer Etiology: Wound Location: Left Lower Leg - Medial Wound Open Wounding Event:  Blister Status: Date Acquired: 01/28/2015 Comorbid Hypertension, Peripheral Arterial Weeks Of Treatment: 3 History: Disease, Type II Diabetes, Clustered Wound: No Osteoarthritis, Neuropathy Photos Photo Uploaded By: Elpidio Eric on 03/13/2015 16:25:28 Wound Measurements Length: (cm) 1.5  Width: (cm) 1.5 Depth: (cm) 0.2 Area: (cm) 1.767 Volume: (cm) 0.353 % Reduction in Area: -212.7% % Reduction in Volume: -212.4% Epithelialization: None Tunneling: No Undermining: No Wound Description Classification: Unclassifiable Diabetic Severity [address]): Grade 1 Wound Margin: Flat and Intact Exudate Amount: Medium SUSETTE, SEMINARA ([id number]) Foul Odor After Cleansing: No Exudate Type: Serosanguineous Exudate Color: red, brown Wound Bed Granulation Amount: None Present (0%) Exposed Structure Necrotic Amount: Large (67-100%) Fascia Exposed: No Necrotic Quality: Eschar, Adherent Slough Fat Layer Exposed: No Tendon Exposed: No Muscle Exposed: No Joint Exposed: No Bone Exposed: No Limited to Skin Breakdown Periwound Skin Texture Texture Color No Abnormalities Noted: No No Abnormalities Noted: No Callus: No Atrophie Blanche: No Crepitus: No Cyanosis: No Excoriation: No Ecchymosis: No Fluctuance: No Erythema: No Friable: No Hemosiderin Staining: No Induration: No Mottled: No Localized Edema: Yes Pallor: No Rash: No Rubor: No Scarring: No Temperature / Pain Moisture Tenderness on Palpation: Yes No Abnormalities Noted: No Dry / Scaly: No Maceration: No Moist: Yes Wound Preparation Ulcer Cleansing: Rinsed/Irrigated with Saline Topical Anesthetic Applied: Other: lidocaine 4%, Treatment Notes Wound #2 (Left, Medial Lower Leg) 1. Cleansed with: Cleanse wound with antibacterial soap and water 4. Dressing Applied: Santyl Ointment 5. Secondary Dressing Applied [place] 7. Secured with Tubigrip Notes AZORA, BONZO ([id number]) telfa Electronic  Signature(s) Signed: 03/13/2015 1:04:08 PM By: Elpidio Eric BSN, RN Entered By: Elpidio Eric on 03/13/2015 13:04:08 PRIYAL, MUSQUIZ ([id number]) -------------------------------------------------------------------------------- Vitals Details Patient Name: [name] Date of Service: 03/13/2015 12:45 PM Medical Record Number: [id number] Patient Account Number: [id number] Date of Birth/Sex: 1945/04/16 (70 y.o. Female) Treating RN: Afful, RN, BSN, [place] Sink Primary Care Physician: Sherrie Mustache Other Clinician: Referring Physician: Sherrie Mustache Treating Physician/Extender: Rudene Re in Treatment: 3 Vital Signs Time Taken: 12:57 Temperature (F): 97.8 Height (in): 67 Pulse (bpm): 68 Weight (lbs): 302 Respiratory Rate (breaths/min): 18 Body Mass Index (BMI): 47.3 Blood Pressure (mmHg): 140/63 Reference Range: 80 - 120 mg / dl Electronic Signature(s) Signed: 03/13/2015 5:22:56 PM By: Elpidio Eric BSN, RN Entered By: Elpidio Eric on 03/13/2015 12:59:08

## 2015-03-14 DIAGNOSIS — I70342 Atherosclerosis of unspecified type of bypass graft(s) of the left leg with ulceration of calf: Secondary | ICD-10-CM | POA: Diagnosis not present

## 2015-03-14 DIAGNOSIS — L97221 Non-pressure chronic ulcer of left calf limited to breakdown of skin: Secondary | ICD-10-CM | POA: Diagnosis not present

## 2015-03-14 DIAGNOSIS — L97229 Non-pressure chronic ulcer of left calf with unspecified severity: Secondary | ICD-10-CM | POA: Diagnosis not present

## 2015-03-14 DIAGNOSIS — E1151 Type 2 diabetes mellitus with diabetic peripheral angiopathy without gangrene: Secondary | ICD-10-CM | POA: Diagnosis not present

## 2015-03-15 DIAGNOSIS — I70342 Atherosclerosis of unspecified type of bypass graft(s) of the left leg with ulceration of calf: Secondary | ICD-10-CM | POA: Diagnosis not present

## 2015-03-15 DIAGNOSIS — L97221 Non-pressure chronic ulcer of left calf limited to breakdown of skin: Secondary | ICD-10-CM | POA: Diagnosis not present

## 2015-03-15 DIAGNOSIS — L97229 Non-pressure chronic ulcer of left calf with unspecified severity: Secondary | ICD-10-CM | POA: Diagnosis not present

## 2015-03-15 DIAGNOSIS — E1151 Type 2 diabetes mellitus with diabetic peripheral angiopathy without gangrene: Secondary | ICD-10-CM | POA: Diagnosis not present

## 2015-03-16 DIAGNOSIS — L97221 Non-pressure chronic ulcer of left calf limited to breakdown of skin: Secondary | ICD-10-CM | POA: Diagnosis not present

## 2015-03-16 DIAGNOSIS — E1151 Type 2 diabetes mellitus with diabetic peripheral angiopathy without gangrene: Secondary | ICD-10-CM | POA: Diagnosis not present

## 2015-03-16 DIAGNOSIS — L97229 Non-pressure chronic ulcer of left calf with unspecified severity: Secondary | ICD-10-CM | POA: Diagnosis not present

## 2015-03-16 DIAGNOSIS — I70342 Atherosclerosis of unspecified type of bypass graft(s) of the left leg with ulceration of calf: Secondary | ICD-10-CM | POA: Diagnosis not present

## 2015-03-17 DIAGNOSIS — L97221 Non-pressure chronic ulcer of left calf limited to breakdown of skin: Secondary | ICD-10-CM | POA: Diagnosis not present

## 2015-03-17 DIAGNOSIS — L97229 Non-pressure chronic ulcer of left calf with unspecified severity: Secondary | ICD-10-CM | POA: Diagnosis not present

## 2015-03-17 DIAGNOSIS — E1151 Type 2 diabetes mellitus with diabetic peripheral angiopathy without gangrene: Secondary | ICD-10-CM | POA: Diagnosis not present

## 2015-03-17 DIAGNOSIS — I70342 Atherosclerosis of unspecified type of bypass graft(s) of the left leg with ulceration of calf: Secondary | ICD-10-CM | POA: Diagnosis not present

## 2015-03-18 DIAGNOSIS — L97229 Non-pressure chronic ulcer of left calf with unspecified severity: Secondary | ICD-10-CM | POA: Diagnosis not present

## 2015-03-18 DIAGNOSIS — E1151 Type 2 diabetes mellitus with diabetic peripheral angiopathy without gangrene: Secondary | ICD-10-CM | POA: Diagnosis not present

## 2015-03-18 DIAGNOSIS — L97221 Non-pressure chronic ulcer of left calf limited to breakdown of skin: Secondary | ICD-10-CM | POA: Diagnosis not present

## 2015-03-18 DIAGNOSIS — I70342 Atherosclerosis of unspecified type of bypass graft(s) of the left leg with ulceration of calf: Secondary | ICD-10-CM | POA: Diagnosis not present

## 2015-03-19 DIAGNOSIS — E1151 Type 2 diabetes mellitus with diabetic peripheral angiopathy without gangrene: Secondary | ICD-10-CM | POA: Diagnosis not present

## 2015-03-19 DIAGNOSIS — L97221 Non-pressure chronic ulcer of left calf limited to breakdown of skin: Secondary | ICD-10-CM | POA: Diagnosis not present

## 2015-03-19 DIAGNOSIS — L97229 Non-pressure chronic ulcer of left calf with unspecified severity: Secondary | ICD-10-CM | POA: Diagnosis not present

## 2015-03-19 DIAGNOSIS — I70342 Atherosclerosis of unspecified type of bypass graft(s) of the left leg with ulceration of calf: Secondary | ICD-10-CM | POA: Diagnosis not present

## 2015-03-20 ENCOUNTER — Encounter: Payer: Medicare Other | Attending: Internal Medicine | Admitting: Internal Medicine

## 2015-03-20 DIAGNOSIS — L97221 Non-pressure chronic ulcer of left calf limited to breakdown of skin: Secondary | ICD-10-CM | POA: Diagnosis not present

## 2015-03-20 DIAGNOSIS — E1140 Type 2 diabetes mellitus with diabetic neuropathy, unspecified: Secondary | ICD-10-CM | POA: Diagnosis not present

## 2015-03-20 DIAGNOSIS — E11622 Type 2 diabetes mellitus with other skin ulcer: Secondary | ICD-10-CM | POA: Diagnosis not present

## 2015-03-20 DIAGNOSIS — I10 Essential (primary) hypertension: Secondary | ICD-10-CM | POA: Diagnosis not present

## 2015-03-20 DIAGNOSIS — I739 Peripheral vascular disease, unspecified: Secondary | ICD-10-CM | POA: Insufficient documentation

## 2015-03-20 DIAGNOSIS — M1990 Unspecified osteoarthritis, unspecified site: Secondary | ICD-10-CM | POA: Diagnosis not present

## 2015-03-20 DIAGNOSIS — I70322 Atherosclerosis of unspecified type of bypass graft(s) of the extremities with rest pain, left leg: Secondary | ICD-10-CM | POA: Diagnosis not present

## 2015-03-20 DIAGNOSIS — I70342 Atherosclerosis of unspecified type of bypass graft(s) of the left leg with ulceration of calf: Secondary | ICD-10-CM | POA: Insufficient documentation

## 2015-03-20 DIAGNOSIS — L97821 Non-pressure chronic ulcer of other part of left lower leg limited to breakdown of skin: Secondary | ICD-10-CM | POA: Diagnosis not present

## 2015-03-21 DIAGNOSIS — L97229 Non-pressure chronic ulcer of left calf with unspecified severity: Secondary | ICD-10-CM | POA: Diagnosis not present

## 2015-03-21 DIAGNOSIS — L97221 Non-pressure chronic ulcer of left calf limited to breakdown of skin: Secondary | ICD-10-CM | POA: Diagnosis not present

## 2015-03-21 DIAGNOSIS — I70342 Atherosclerosis of unspecified type of bypass graft(s) of the left leg with ulceration of calf: Secondary | ICD-10-CM | POA: Diagnosis not present

## 2015-03-21 DIAGNOSIS — E1151 Type 2 diabetes mellitus with diabetic peripheral angiopathy without gangrene: Secondary | ICD-10-CM | POA: Diagnosis not present

## 2015-03-22 DIAGNOSIS — I70342 Atherosclerosis of unspecified type of bypass graft(s) of the left leg with ulceration of calf: Secondary | ICD-10-CM | POA: Diagnosis not present

## 2015-03-22 DIAGNOSIS — L97229 Non-pressure chronic ulcer of left calf with unspecified severity: Secondary | ICD-10-CM | POA: Diagnosis not present

## 2015-03-22 DIAGNOSIS — L97221 Non-pressure chronic ulcer of left calf limited to breakdown of skin: Secondary | ICD-10-CM | POA: Diagnosis not present

## 2015-03-22 DIAGNOSIS — E1151 Type 2 diabetes mellitus with diabetic peripheral angiopathy without gangrene: Secondary | ICD-10-CM | POA: Diagnosis not present

## 2015-03-22 NOTE — Progress Notes (Signed)
MODESTY, RUDY ([id number]) Visit Report for 03/20/2015 Arrival Information Details Patient Name: [name], [name] Date of Service: 03/20/2015 2:45 PM Medical Record Patient Account Number: [id number] Number: Treating RN: Curtis Sites [birth year] (70 y.o. Other Clinician: Date of Birth/Sex: Female) Treating ROBSON, MICHAEL Primary Care Physician: Sherrie Mustache Physician/Extender: G Referring Physician: Sherrie Mustache Weeks in Treatment: 4 Visit Information History Since Last Visit Added or deleted any medications: No Patient Arrived: Wheel Chair Any new allergies or adverse reactions: No Arrival Time: 15:16 Had a fall or experienced change in No Accompanied By: cousin activities of daily living that may affect Transfer Assistance: None risk of falls: Patient Identification Verified: Yes Signs or symptoms of abuse/neglect since last No Secondary Verification Process Yes visito Completed: Hospitalized since last visit: No Patient Requires Transmission- No Pain Present Now: No Based Precautions: Patient Has Alerts: Yes Patient Alerts: Patient on Blood Thinner plavix, aspirin 81mg  DMII Electronic Signature(s) Signed: 03/20/2015 5:13:21 PM By: Curtis Sites Entered By: Curtis Sites on 03/20/2015 15:17:43 Loy, [name] ([id number]) -------------------------------------------------------------------------------- Encounter Discharge Information Details Patient Name: [name] Date of Service: 03/20/2015 2:45 PM Medical Record Patient Account Number: [id number] Number: Treating RN: Curtis Sites 13-May-1945 (70 y.o. Other Clinician: Date of Birth/Sex: Female) Treating ROBSON, MICHAEL Primary Care Physician: Sherrie Mustache Physician/Extender: G Referring Physician: Augustin Coupe in Treatment: 4 Encounter Discharge Information Items Discharge Pain Level: 0 Discharge Condition: Stable Ambulatory Status: Wheelchair Discharge Destination:  Home Transportation: Private Auto Accompanied By: cousin Schedule Follow-up Appointment: Yes Medication Reconciliation completed No and provided to Patient/Care Ason Heslin: Provided on Clinical Summary of Care: 03/20/2015 Form Type Recipient Paper Patient MW Electronic Signature(s) Signed: 03/20/2015 3:53:29 PM By: Gwenlyn Perking Entered By: Gwenlyn Perking on 03/20/2015 15:53:28 Mahar, [name] ([id number]) -------------------------------------------------------------------------------- Lower Extremity Assessment Details Patient Name: [name] Date of Service: 03/20/2015 2:45 PM Medical Record Patient Account Number: [id number] Number: Treating RN: Curtis Sites Aug 31, 1945 (70 y.o. Other Clinician: Date of Birth/Sex: Female) Treating ROBSON, MICHAEL Primary Care Physician: Sherrie Mustache Physician/Extender: G Referring Physician: Sherrie Mustache Weeks in Treatment: 4 Edema Assessment Assessed: [Left: No] [Right: No] Edema: [Left: Ye] [Right: s] Vascular Assessment Pulses: Posterior Tibial Dorsalis Pedis Palpable: [Left:Yes] Extremity colors, hair growth, and conditions: Extremity Color: [Left:Hyperpigmented] Hair Growth on Extremity: [Left:No] Temperature of Extremity: [Left:Warm] Capillary Refill: [Left:< 3 seconds] Electronic Signature(s) Signed: 03/20/2015 5:13:21 PM By: Curtis Sites Entered By: Curtis Sites on 03/20/2015 15:23:51 Balli, [name] ([id number]) -------------------------------------------------------------------------------- Multi Wound Chart Details Patient Name: [name] Date of Service: 03/20/2015 2:45 PM Medical Record Patient Account Number: [id number] Number: Treating RN: Curtis Sites 12/15/1945 (70 y.o. Other Clinician: Date of Birth/Sex: Female) Treating ROBSON, MICHAEL Primary Care Physician: Sherrie Mustache Physician/Extender: G Referring Physician: Sherrie Mustache Weeks in Treatment: 4 Vital Signs Height(in):  67 Pulse(bpm): 65 Weight(lbs): 302 Blood Pressure 162/70 (mmHg): Body Mass Index(BMI): 47 Temperature(F): 98.1 Respiratory Rate 18 (breaths/min): Photos: [2:No Photos] [N/A:N/A] Wound Location: [2:Left Lower Leg - Medial] [N/A:N/A] Wounding Event: [2:Blister] [N/A:N/A] Primary Etiology: [2:Venous Leg Ulcer] [N/A:N/A] Comorbid History: [2:Hypertension, Peripheral Arterial Disease, Type II Diabetes, Osteoarthritis, Neuropathy] [N/A:N/A] Date Acquired: [2:01/28/2015] [N/A:N/A] Weeks of Treatment: [2:4] [N/A:N/A] Wound Status: [2:Open] [N/A:N/A] Measurements L x W x D 1.1x1.6x0.1 [N/A:N/A] (cm) Area (cm) : [2:1.382] [N/A:N/A] Volume (cm) : [2:0.138] [N/A:N/A] % Reduction in Area: [2:-144.60%] [N/A:N/A] % Reduction in Volume: -22.10% [N/A:N/A] Classification: [2:Unclassifiable] [N/A:N/A] HBO Classification: [2:Grade 1] [N/A:N/A] Exudate Amount: [2:Medium] [N/A:N/A] Exudate Type: [2:Serosanguineous] [N/A:N/A] Exudate Color: [2:red, brown] [N/A:N/A] Wound  Margin: [2:Flat and Intact] [N/A:N/A] Granulation Amount: [2:None Present (0%)] [N/A:N/A] Necrotic Amount: [2:Large (67-100%)] [N/A:N/A] Necrotic Tissue: [2:Eschar, Adherent Slough] [N/A:N/A] Exposed Structures: [2:Fascia: No Fat: No] [N/A:N/A] Tendon: No Muscle: No Joint: No Bone: No Limited to Skin Breakdown Epithelialization: None N/A N/A Periwound Skin Texture: Edema: Yes N/A N/A Excoriation: No Induration: No Callus: No Crepitus: No Fluctuance: No Friable: No Rash: No Scarring: No Periwound Skin Moist: Yes N/A N/A Moisture: Maceration: No Dry/Scaly: No Periwound Skin Color: Atrophie Blanche: No N/A N/A Cyanosis: No Ecchymosis: No Erythema: No Hemosiderin Staining: No Mottled: No Pallor: No Rubor: No Tenderness on Yes N/A N/A Palpation: Wound Preparation: Ulcer Cleansing: N/A N/A Rinsed/Irrigated with Saline Topical Anesthetic Applied: Other: lidocaine 4% Treatment Notes Electronic  Signature(s) Signed: 03/20/2015 5:13:21 PM By: Curtis Sites Entered By: Curtis Sites on 03/20/2015 15:38:01 Basil, [name] ([id number]) -------------------------------------------------------------------------------- Multi-Disciplinary Care Plan Details Patient Name: [name] Date of Service: 03/20/2015 2:45 PM Medical Record Patient Account Number: [id number] Number: Treating RN: Curtis Sites Mar 29, 1945 (69 y.o. Other Clinician: Date of Birth/Sex: Female) Treating ROBSON, MICHAEL Primary Care Physician: Sherrie Mustache Physician/Extender: G Referring Physician: Sherrie Mustache Weeks in Treatment: 4 Active Inactive Abuse / Safety / Falls / Self Care Management Nursing Diagnoses: Potential for falls Goals: Patient will remain injury free Date Initiated: 02/20/2015 Goal Status: Active Interventions: Assess fall risk on admission and as needed Notes: Nutrition Nursing Diagnoses: Potential for alteratiion in Nutrition/Potential for imbalanced nutrition Goals: Patient/caregiver will maintain therapeutic glucose control Date Initiated: 02/20/2015 Goal Status: Active Interventions: Assess patient nutrition upon admission and as needed per policy Notes: Orientation to the Wound Care Program Nursing Diagnoses: Knowledge deficit related to the wound healing center program Goals: THANH, POMERLEAU ([id number]) Patient/caregiver will verbalize understanding of the Wound Healing Center Program Date Initiated: 02/20/2015 Goal Status: Active Interventions: Provide education on orientation to the wound center Notes: Wound/Skin Impairment Nursing Diagnoses: Impaired tissue integrity Goals: Ulcer/skin breakdown will heal within 14 weeks Date Initiated: 02/20/2015 Goal Status: Active Interventions: Assess ulceration(s) every visit Notes: Electronic Signature(s) Signed: 03/20/2015 5:13:21 PM By: Curtis Sites Entered By: Curtis Sites on 03/20/2015 15:37:35 [place], [name]  ([id number]) -------------------------------------------------------------------------------- Patient/Caregiver Education Details Patient Name: [name] Date of Service: 03/20/2015 2:45 PM Medical Record Patient Account Number: [id number] Number: Treating RN: Curtis Sites 05-Oct-1945 (69 y.o. Other Clinician: Date of Birth/Gender: Female) Treating ROBSON, MICHAEL Primary Care Physician: Sherrie Mustache Physician/Extender: G Referring Physician: Augustin Coupe in Treatment: 4 Education Assessment Education Provided To: Patient Education Topics Provided Wound Debridement: Handouts: Other: purpose of debridement Methods: Explain/Verbal Responses: State content correctly Electronic Signature(s) Signed: 03/20/2015 5:13:21 PM By: Curtis Sites Entered By: Curtis Sites on 03/20/2015 15:41:04 Carden, [name] ([id number]) -------------------------------------------------------------------------------- Wound Assessment Details Patient Name: [name] Date of Service: 03/20/2015 2:45 PM Medical Record Patient Account Number: [id number] Number: Treating RN: Curtis Sites [birth year] (69 y.o. Other Clinician: Date of Birth/Sex: Female) Treating ROBSON, MICHAEL Primary Care Physician: Sherrie Mustache Physician/Extender: G Referring Physician: Sherrie Mustache Weeks in Treatment: 4 Wound Status Wound Number: 2 Primary Venous Leg Ulcer Etiology: Wound Location: Left Lower Leg - Medial Wound Open Wounding Event: Blister Status: Date Acquired: 01/28/2015 Comorbid Hypertension, Peripheral Arterial Weeks Of Treatment: 4 History: Disease, Type II Diabetes, Clustered Wound: No Osteoarthritis, Neuropathy Photos Photo Uploaded By: Curtis Sites on 03/20/2015 16:47:36 Wound Measurements Length: (cm) 1.1 Width: (cm) 1.6 Depth: (cm) 0.1 Area: (cm) 1.382 Volume: (cm) 0.138 % Reduction in Area: -144.6% % Reduction in Volume: -  22.1% Epithelialization:  None Tunneling: No Undermining: No Wound Description Classification: Unclassifiable Foul Odor After Diabetic Severity Loreta Avener): Grade 1 Wound Margin: Flat and Intact Exudate Amount: Medium Exudate Type: Serosanguineous Exudate Color: red, brown Cleansing: No Wound Bed Granulation Amount: None Present (0%) Exposed Structure Blancett, Lexianna N. ([id number]) Necrotic Amount: Large (67-100%) Fascia Exposed: No Necrotic Quality: Eschar, Adherent Slough Fat Layer Exposed: No Tendon Exposed: No Muscle Exposed: No Joint Exposed: No Bone Exposed: No Limited to Skin Breakdown Periwound Skin Texture Texture Color No Abnormalities Noted: No No Abnormalities Noted: No Callus: No Atrophie Blanche: No Crepitus: No Cyanosis: No Excoriation: No Ecchymosis: No Fluctuance: No Erythema: No Friable: No Hemosiderin Staining: No Induration: No Mottled: No Localized Edema: Yes Pallor: No Rash: No Rubor: No Scarring: No Temperature / Pain Moisture Tenderness on Palpation: Yes No Abnormalities Noted: No Dry / Scaly: No Maceration: No Moist: Yes Wound Preparation Ulcer Cleansing: Rinsed/Irrigated with Saline Topical Anesthetic Applied: Other: lidocaine 4%, Electronic Signature(s) Signed: 03/20/2015 5:13:21 PM By: Curtis Sites Entered By: Curtis Sites on 03/20/2015 15:22:37 TYANNA, HACH ([id number]) -------------------------------------------------------------------------------- Vitals Details Patient Name: [name] Date of Service: 03/20/2015 2:45 PM Medical Record Patient Account Number: [id number] Number: Treating RN: Curtis Sites 1945/03/21 (69 y.o. Other Clinician: Date of Birth/Sex: Female) Treating ROBSON, MICHAEL Primary Care Physician: Sherrie Mustache Physician/Extender: G Referring Physician: Sherrie Mustache Weeks in Treatment: 4 Vital Signs Time Taken: 15:22 Temperature (F): 98.1 Height (in): 67 Pulse (bpm): 65 Weight (lbs): 302 Respiratory Rate  (breaths/min): 18 Body Mass Index (BMI): 47.3 Blood Pressure (mmHg): 162/70 Reference Range: 80 - 120 mg / dl Electronic Signature(s) Signed: 03/20/2015 5:13:21 PM By: Curtis Sites Entered By: Curtis Sites on 03/20/2015 15:23:04

## 2015-03-22 NOTE — Progress Notes (Signed)
[name] ([id number]) Visit Report for 03/20/2015 Chief Complaint Document Details Patient Name: [name], [name] Date of Service: 03/20/2015 2:45 PM Medical Record Patient Account Number: [id number] Number: Treating RN: Curtis Sites [birth year][name] (69 y.o. Other Clinician: Date of Birth/Sex: Female) Treating Ryu Cerreta Primary Care Physician/Extender: Kerry Kass, [facility] Physician: Referring Physician: Sherrie Mustache Weeks in Treatment: 4 Information Obtained from: Patient Chief Complaint Left calf ulcerations. Arterial insufficiency. Electronic Signature(s) Signed: 03/20/2015 4:36:57 PM By: Baltazar Najjar MD Entered By: Baltazar Najjar on 03/20/2015 16:09:56 [name], [name] -------------------------------------------------------------------------------- Debridement Details Patient Name: [name] Date of Service: 03/20/2015 2:45 PM Medical Record Patient Account Number: [id number] Number: Treating RN: Curtis Sites [name], [name] (69 y.o. Other Clinician: Date of Birth/Sex: Female) Treating Jeff Mccallum Primary Care Physician/Extender: Kerry Kass, [facility] Physician: Referring Physician: Sherrie Mustache Weeks in Treatment: 4 Debridement Performed for Wound #2 Left,Medial Lower Leg Assessment: Performed By: Physician Maxwell Caul, MD Debridement: Open Wound/Selective Debridement Selective Description: Pre-procedure Yes Verification/Time Out Taken: Start Time: 15:38 Pain Control: Lidocaine 4% Topical Solution Level: Non-Viable Tissue Total Area Debrided (L x 1.1 (cm) x 1.6 (cm) = 1.76 (cm) W): Tissue and other Non-Viable, Eschar, Fibrin/Slough material debrided: Instrument: Curette Bleeding: Minimum Hemostasis Achieved: Pressure End Time: 15:41 Procedural Pain: 0 Post Procedural Pain: 0 Response to Treatment: Procedure was tolerated well Post Debridement Measurements of Total Wound Length: (cm) 1.1 Width: (cm) 1.6 Depth:  (cm) 0.3 Volume: (cm) 0.415 Post Procedure Diagnosis Same as Pre-procedure Electronic Signature(s) Signed: 03/20/2015 4:36:57 PM By: Baltazar Najjar MD Signed: 03/20/2015 5:13:21 PM By: [name], [name] ([id number]) Entered By: Baltazar Najjar on 03/20/2015 16:09:48 [name], [name] ([id number]) -------------------------------------------------------------------------------- HPI Details Patient Name: [name] Date of Service: 03/20/2015 2:45 PM Medical Record Patient Account Number: [id number] Number: Treating RN: Curtis Sites [name]-[birth year] (69 y.o. Other Clinician: Date of Birth/Sex: Female) Treating Dimitra Woodstock Primary Care Physician/Extender: Kerry Kass, [facility] Physician: Referring Physician: Sherrie Mustache Weeks in Treatment: 4 History of Present Illness HPI Description: Pleasant 70 year old with diabetes (no hemoglobin A1c available) and severe peripheral vascular disease. She reports a prior left lower extremity stent. Records unavailable. Underwent multiple level angioplasty of left lower extremity 10/29/2014 by Dr. Wyn Quaker. Developed left calf ulcerations in late December 2016. Denies any trauma. Seen by her PCP. Bactrim prescribed. Referred to the wound clinic. Biopsy culture 02/20/2015 grew methicillin sensitive staph aureus, sensitive to Bactrim. Performing dressing changes with Prisma. Using a Tubigrip for edema control. Scheduled to see Dr. Wyn Quaker in follow-up 02/25/2015. Cancelled because of weather. Rescheduled for 03/06/2015. She returns to clinic for follow-up and is without new complaints. Pain improved. No ischemic rest pain. No fever or chills. Minimal drainage. 03/13/2015 -- records obtained from [facility] shows that she was seen by Dr. dew on 03/12/2015 and given tramadol and other symptomatic treatment. Recent ABI done showed right to be 0.94 and left to be 0.47 a left lower extremity arterial duplex was notable for an  occluded stent and distal ATA. Recommendations were for left lower extremity angiogram with intervention. on 03/11/2015 she was taken up for a aortogram with angioplasty of the left posterior tibial artery and tibioperoneal trunk, above-knee popliteal artery and almost entire SFA, stent placement to the SFA and popliteal artery. 03/20/15 her arterial interventions noted. The patient states her left leg feels better. He ischemic wound on her left anterior leg. She has been using Engineer, civil (consulting)) Signed: 03/20/2015 4:36:57 PM By: Baltazar Najjar MD  Entered By: Baltazar Najjar on 03/20/2015 16:11:07 [name], [name] ([id number]) -------------------------------------------------------------------------------- Physical Exam Details Patient Name: [name], [name] Date of Service: 03/20/2015 2:45 PM Medical Record Patient Account Number: [id number] Number: Treating RN: Curtis Sites [birth year] (69 y.o. Other Clinician: Date of Birth/Sex: Female) Treating Terea Neubauer Primary Care Physician/Extender: Kerry Kass, [facility] Physician: Referring Physician: Sherrie Mustache Weeks in Treatment: 4 Notes Wound exam; small but probably deep wound on the left anterior medial calf remains. I debridement this using a curette with some difficulty removing only fibrinous surface slough. There is no evidence of surrounding infection. I was not able to get down to a viable wound surface Electronic Signature(s) Signed: 03/20/2015 4:36:57 PM By: Baltazar Najjar MD Entered By: Baltazar Najjar on 03/20/2015 16:11:58 [name], [name] -------------------------------------------------------------------------------- Physician Orders Details Patient Name: [name] Date of Service: 03/20/2015 2:45 PM Medical Record Patient Account Number: [id number] Number: Treating RN: Curtis Sites [name]-[birth year] (69 y.o. Other Clinician: Date of Birth/Sex: Female) Treating Teodoro Jeffreys,  Smokey Melott Primary Care Physician/Extender: Kerry Kass, [facility] Physician: Referring Physician: Sherrie Mustache Weeks in Treatment: 4 Verbal / Phone Orders: Yes Clinician: Curtis Sites Read Back and Verified: Yes Diagnosis Coding Wound Cleansing Wound #2 Left,Medial Lower Leg o Clean wound with wound cleanser. Anesthetic Wound #2 Left,Medial Lower Leg o Topical Lidocaine 4% cream applied to wound bed prior to debridement Primary Wound Dressing Wound #2 Left,Medial Lower Leg o Santyl Ointment Secondary Dressing Wound #2 Left,Medial Lower Leg o Conform/Kerlix - telfa Dressing Change Frequency Wound #2 Left,Medial Lower Leg o Change dressing every day. Follow-up Appointments Wound #2 Left,Medial Lower Leg o Return Appointment in 1 week. Edema Control Wound #2 Left,Medial Lower Leg o Elevate legs to the level of the heart and pump ankles as often as possible o Tubigrip Home Health Wound #2 Left,Medial Lower Leg o Continue Home Health Visits [name], [name] ([id number]) o Home Health Nurse may visit PRN to address patientos wound care needs. o FACE TO FACE ENCOUNTER: MEDICARE and MEDICAID PATIENTS: I certify that this patient is under my care and that I had a face-to-face encounter that meets the physician face-to-face encounter requirements with this patient on this date. The encounter with the patient was in whole or in part for the following MEDICAL CONDITION: (primary reason for Home Healthcare) MEDICAL NECESSITY: I certify, that based on my findings, NURSING services are a medically necessary home health service. HOME BOUND STATUS: I certify that my clinical findings support that this patient is homebound (i.e., Due to illness or injury, pt requires aid of supportive devices such as crutches, cane, wheelchairs, walkers, the use of special transportation or the assistance of another person to leave their place of residence. There is a normal inability to  leave the home and doing so requires considerable and taxing effort. Other absences are for medical reasons / religious services and are infrequent or of short duration when for other reasons). o If current dressing causes regression in wound condition, may D/C ordered dressing product/s and apply Normal Saline Moist Dressing daily until next Wound Healing Center / Other MD appointment. Notify Wound Healing Center of regression in wound condition at [phone]. o Please direct any NON-WOUND related issues/requests for orders to patient's Primary Care Physician Electronic Signature(s) Signed: 03/20/2015 4:36:57 PM By: Baltazar Najjar MD Signed: 03/20/2015 5:13:21 PM By: Curtis Sites Entered By: Curtis Sites on 03/20/2015 15:42:17 [name], [name] ([id number]) -------------------------------------------------------------------------------- Problem List Details Patient Name: [name] Date of Service: 03/20/2015 2:45 PM  Medical Record Patient Account Number: [id number] Number: Treating RN: Curtis Sites [birth year][name] (69 y.o. 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 Other Clinician: Date of Birth/Sex: Female) Treating Criston Chancellor Primary Care Physician/Extender: Kerry Kass, [facility] Physician: Referring Physician: Sherrie Mustache Weeks in Treatment: 4 Plan #1 continue Santyl-based foam dressings. She will require debridement next week. I am not sure about the compression we are using at the time of this dictation Electronic Signature(s) Signed: 03/20/2015 4:36:57 PM By: Baltazar Najjar MD Entered By: Baltazar Najjar on 03/20/2015 16:15:39 [name], [name] -------------------------------------------------------------------------------- SuperBill Details Patient Name: [name] Date of Service: 03/20/2015 Medical Record Patient Account Number: [id number] Number: Treating RN: Curtis Sites [name], [name] (69 y.o. 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, left I70.322 leg Facility Procedures CPT4: Description Modifier Quantity Code [id number] - DEBRIDE WOUND 1ST 20 SQ CM OR < 1 ICD-10 Description Diagnosis 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

## 2015-03-23 DIAGNOSIS — L97221 Non-pressure chronic ulcer of left calf limited to breakdown of skin: Secondary | ICD-10-CM | POA: Diagnosis not present

## 2015-03-23 DIAGNOSIS — I70342 Atherosclerosis of unspecified type of bypass graft(s) of the left leg with ulceration of calf: Secondary | ICD-10-CM | POA: Diagnosis not present

## 2015-03-23 DIAGNOSIS — L97229 Non-pressure chronic ulcer of left calf with unspecified severity: Secondary | ICD-10-CM | POA: Diagnosis not present

## 2015-03-23 DIAGNOSIS — E1151 Type 2 diabetes mellitus with diabetic peripheral angiopathy without gangrene: Secondary | ICD-10-CM | POA: Diagnosis not present

## 2015-03-24 DIAGNOSIS — I70342 Atherosclerosis of unspecified type of bypass graft(s) of the left leg with ulceration of calf: Secondary | ICD-10-CM | POA: Diagnosis not present

## 2015-03-24 DIAGNOSIS — E1151 Type 2 diabetes mellitus with diabetic peripheral angiopathy without gangrene: Secondary | ICD-10-CM | POA: Diagnosis not present

## 2015-03-24 DIAGNOSIS — L97221 Non-pressure chronic ulcer of left calf limited to breakdown of skin: Secondary | ICD-10-CM | POA: Diagnosis not present

## 2015-03-24 DIAGNOSIS — L97229 Non-pressure chronic ulcer of left calf with unspecified severity: Secondary | ICD-10-CM | POA: Diagnosis not present

## 2015-03-26 DIAGNOSIS — L97229 Non-pressure chronic ulcer of left calf with unspecified severity: Secondary | ICD-10-CM | POA: Diagnosis not present

## 2015-03-26 DIAGNOSIS — L97221 Non-pressure chronic ulcer of left calf limited to breakdown of skin: Secondary | ICD-10-CM | POA: Diagnosis not present

## 2015-03-26 DIAGNOSIS — E1151 Type 2 diabetes mellitus with diabetic peripheral angiopathy without gangrene: Secondary | ICD-10-CM | POA: Diagnosis not present

## 2015-03-26 DIAGNOSIS — I70342 Atherosclerosis of unspecified type of bypass graft(s) of the left leg with ulceration of calf: Secondary | ICD-10-CM | POA: Diagnosis not present

## 2015-03-27 ENCOUNTER — Encounter: Payer: Medicare Other | Admitting: Internal Medicine

## 2015-03-27 DIAGNOSIS — I10 Essential (primary) hypertension: Secondary | ICD-10-CM | POA: Diagnosis not present

## 2015-03-27 DIAGNOSIS — E11622 Type 2 diabetes mellitus with other skin ulcer: Secondary | ICD-10-CM | POA: Diagnosis not present

## 2015-03-27 DIAGNOSIS — I70342 Atherosclerosis of unspecified type of bypass graft(s) of the left leg with ulceration of calf: Secondary | ICD-10-CM | POA: Diagnosis not present

## 2015-03-27 DIAGNOSIS — L97221 Non-pressure chronic ulcer of left calf limited to breakdown of skin: Secondary | ICD-10-CM | POA: Diagnosis not present

## 2015-03-27 DIAGNOSIS — M1990 Unspecified osteoarthritis, unspecified site: Secondary | ICD-10-CM | POA: Diagnosis not present

## 2015-03-27 DIAGNOSIS — I70322 Atherosclerosis of unspecified type of bypass graft(s) of the extremities with rest pain, left leg: Secondary | ICD-10-CM | POA: Diagnosis not present

## 2015-03-27 DIAGNOSIS — E1140 Type 2 diabetes mellitus with diabetic neuropathy, unspecified: Secondary | ICD-10-CM | POA: Diagnosis not present

## 2015-03-27 DIAGNOSIS — L97821 Non-pressure chronic ulcer of other part of left lower leg limited to breakdown of skin: Secondary | ICD-10-CM | POA: Diagnosis not present

## 2015-03-27 DIAGNOSIS — I739 Peripheral vascular disease, unspecified: Secondary | ICD-10-CM | POA: Diagnosis not present

## 2015-03-28 DIAGNOSIS — I70342 Atherosclerosis of unspecified type of bypass graft(s) of the left leg with ulceration of calf: Secondary | ICD-10-CM | POA: Diagnosis not present

## 2015-03-28 DIAGNOSIS — L97229 Non-pressure chronic ulcer of left calf with unspecified severity: Secondary | ICD-10-CM | POA: Diagnosis not present

## 2015-03-28 DIAGNOSIS — L97221 Non-pressure chronic ulcer of left calf limited to breakdown of skin: Secondary | ICD-10-CM | POA: Diagnosis not present

## 2015-03-28 DIAGNOSIS — E1151 Type 2 diabetes mellitus with diabetic peripheral angiopathy without gangrene: Secondary | ICD-10-CM | POA: Diagnosis not present

## 2015-03-28 NOTE — Progress Notes (Signed)
PEARLENE, TEAT ([id number]) Visit Report for 03/27/2015 Chief Complaint Document Details Patient Name: [name], [name] Date of Service: 03/27/2015 2:15 PM Medical Record Patient Account Number: [id number] Number: Treating RN: Clover Mealy, RN, BSN, Rita January 16, 1946 [phone] y.o. Other Clinician: Date of Birth/Sex: Female) Treating Ella Golomb Primary Care Physician/Extender: Kerry Kass, [facility] Physician: Referring Physician: Sherrie Mustache Weeks in Treatment: 5 Information Obtained from: Patient Chief Complaint Left calf ulcerations. Arterial insufficiency. Electronic Signature(s) Signed: 03/27/2015 5:51:40 PM By: Baltazar Najjar MD Entered By: Baltazar Najjar on 03/27/2015 14:58:19 Spivey, [name] ([id number]) -------------------------------------------------------------------------------- Debridement Details Patient Name: [name] Date of Service: 03/27/2015 2:15 PM Medical Record Patient Account Number: [id number] Number: Treating RN: Clover Mealy RN, BSN, Rita 02/16/1946 [phone] y.o. Other Clinician: Date of Birth/Sex: Female) Treating Nayleen Janosik Primary Care Physician/Extender: Kerry Kass, [facility] Physician: Referring Physician: Sherrie Mustache Weeks in Treatment: 5 Debridement Performed for Wound #2 Left,Medial Lower Leg Assessment: Performed By: Physician Maxwell Caul, MD Debridement: Debridement Pre-procedure Yes Verification/Time Out Taken: Start Time: 14:30 Pain Control: Lidocaine 4% Topical Solution Level: Skin/Subcutaneous Tissue Total Area Debrided (L x 1 (cm) x 2 (cm) = 2 (cm) W): Tissue and other Non-Viable, Exudate, Fibrin/Slough, Subcutaneous material debrided: Instrument: Curette Bleeding: Minimum Hemostasis Achieved: Pressure End Time: 14:33 Procedural Pain: 0 Post Procedural Pain: 0 Response to Treatment: Procedure was tolerated well Post Debridement Measurements of Total Wound Length: (cm) 1 Width: (cm) 2 Depth: (cm) 0.2 Volume:  (cm) 0.314 Post Procedure Diagnosis Same as Pre-procedure Electronic Signature(s) Signed: 03/27/2015 5:19:57 PM By: Elpidio Eric BSN, RN Signed: 03/27/2015 5:51:40 PM By: Baltazar Najjar MD Entered By: Baltazar Najjar on 03/27/2015 14:58:07 Evora, [name] ([id number]) JLYN, CERROS ([id number]) -------------------------------------------------------------------------------- HPI Details Patient Name: [name] Date of Service: 03/27/2015 2:15 PM Medical Record Patient Account Number: [id number] Number: Treating RN: Clover Mealy, RN, BSN, Rita 10/21/1945 [phone] y.o. Other Clinician: Date of Birth/Sex: Female) Treating Velva Molinari Primary Care Physician/Extender: Kerry Kass, [facility] Physician: Referring Physician: Sherrie Mustache Weeks in Treatment: 5 History of Present Illness HPI Description: Pleasant 70 year old with diabetes (no hemoglobin A1c available) and severe peripheral vascular disease. She reports a prior left lower extremity stent. Records unavailable. Underwent multiple level angioplasty of left lower extremity 10/29/2014 by Dr. Wyn Quaker. Developed left calf ulcerations in late December 2016. Denies any trauma. Seen by her PCP. Bactrim prescribed. Referred to the wound clinic. Biopsy culture 02/20/2015 grew methicillin sensitive staph aureus, sensitive to Bactrim. Performing dressing changes with Prisma. Using a Tubigrip for edema control. Scheduled to see Dr. Wyn Quaker in follow-up 02/25/2015. Cancelled because of weather. Rescheduled for 03/06/2015. She returns to clinic for follow-up and is without new complaints. Pain improved. No ischemic rest pain. No fever or chills. Minimal drainage. 03/13/2015 -- records obtained from [facility] shows that she was seen by Dr. dew on 03/12/2015 and given tramadol and other symptomatic treatment. Recent ABI done showed right to be 0.94 and left to be 0.47 a left lower extremity arterial duplex was notable for an occluded  stent and distal ATA. Recommendations were for left lower extremity angiogram with intervention. on 03/11/2015 she was taken up for a aortogram with angioplasty of the left posterior tibial artery and tibioperoneal trunk, above-knee popliteal artery and almost entire SFA, stent placement to the SFA and popliteal artery. 03/20/15 her arterial interventions noted. The patient states her left leg feels better. He ischemic wound on her left anterior leg. She has been using Engineer, civil (consulting)) Signed: 03/27/2015 5:51:40  PM By: Baltazar Najjar MD Entered By: Baltazar Najjar on 03/27/2015 14:58:25 Lowrey, [name] ([id number]) -------------------------------------------------------------------------------- Physical Exam Details Patient Name: [name] Date of Service: 03/27/2015 2:15 PM Medical Record Patient Account Number: [id number] Number: Treating RN: Clover Mealy, RN, BSN, Rita [birth year] [phone] y.o. Other Clinician: Date of Birth/Sex: Female) Treating Lyrical Sowle Primary Care Physician/Extender: Kerry Kass, [facility] Physician: Referring Physician: Sherrie Mustache Weeks in Treatment: 5 Notes Wound exam; small wound on the left anterior medial calf. Surgical debridement done to remove surface slough and nonviable subcutaneous tissue. The wound appears to have a healthier based this week. We changed the Santyl last week. There is no evidence of infection. She has known significant PAD Electronic Signature(s) Signed: 03/27/2015 5:51:40 PM By: Baltazar Najjar MD Entered By: Baltazar Najjar on 03/27/2015 14:59:31 Lipps, [name] ([id number]) -------------------------------------------------------------------------------- Physician Orders Details Patient Name: [name] Date of Service: 03/27/2015 2:15 PM Medical Record Patient Account Number: [id number] Number: Treating RN: Clover Mealy RN, BSN, Rita [birth year] [phone] y.o. Other Clinician: Date of Birth/Sex: Female) Treating  Shadrack Brummitt Primary Care Physician/Extender: Kerry Kass, [facility] Physician: Referring Physician: Augustin Coupe in Treatment: 5 Verbal / Phone Orders: Yes Clinician: Afful, RN, BSN, Rita Read Back and Verified: Yes Diagnosis Coding Wound Cleansing Wound #2 Left,Medial Lower Leg o Clean wound with wound cleanser. Anesthetic Wound #2 Left,Medial Lower Leg o Topical Lidocaine 4% cream applied to wound bed prior to debridement Primary Wound Dressing Wound #2 Left,Medial Lower Leg o Santyl Ointment Secondary Dressing Wound #2 Left,Medial Lower Leg o Conform/Kerlix - telfa Dressing Change Frequency Wound #2 Left,Medial Lower Leg o Change dressing every day. Follow-up Appointments Wound #2 Left,Medial Lower Leg o Return Appointment in 1 week. Edema Control Wound #2 Left,Medial Lower Leg o Elevate legs to the level of the heart and pump ankles as often as possible o Tubigrip Home Health Wound #2 Left,Medial Lower Leg o Continue Home Health Visits DENETTE, HASS ([id number]) o Home Health Nurse may visit PRN to address patientos wound care needs. o FACE TO FACE ENCOUNTER: MEDICARE and MEDICAID PATIENTS: I certify that this patient is under my care and that I had a face-to-face encounter that meets the physician face-to-face encounter requirements with this patient on this date. The encounter with the patient was in whole or in part for the following MEDICAL CONDITION: (primary reason for Home Healthcare) MEDICAL NECESSITY: I certify, that based on my findings, NURSING services are a medically necessary home health service. HOME BOUND STATUS: I certify that my clinical findings support that this patient is homebound (i.e., Due to illness or injury, pt requires aid of supportive devices such as crutches, cane, wheelchairs, walkers, the use of special transportation or the assistance of another person to leave their place of residence. There is a normal  inability to leave the home and doing so requires considerable and taxing effort. Other absences are for medical reasons / religious services and are infrequent or of short duration when for other reasons). o If current dressing causes regression in wound condition, may D/C ordered dressing product/s and apply Normal Saline Moist Dressing daily until next Wound Healing Center / Other MD appointment. Notify Wound Healing Center of regression in wound condition at [phone]. o Please direct any NON-WOUND related issues/requests for orders to patient's Primary Care Physician Electronic Signature(s) Signed: 03/27/2015 5:19:57 PM By: Elpidio Eric BSN, RN Signed: 03/27/2015 5:51:40 PM By: Baltazar Najjar MD Entered By: Elpidio Eric on 03/27/2015 14:35:16 Quilter, [name] ([id number]) --------------------------------------------------------------------------------  Problem List Details Patient Name: SHAKTI, FLEER Date of Service: 03/27/2015 2:15 PM Medical Record Patient Account Number: [id number] Number: Treating RN: Clover Mealy, RN, BSN, Rita [birth year] [phone] y.o. 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 Other Clinician: Date of Birth/Sex: Female) Treating Caily Rakers Primary Care Physician/Extender: Kerry Kass, [facility] Physician: Referring Physician: Sherrie Mustache Weeks in Treatment: 5 Subjective Chief Complaint Information obtained from Patient Left calf ulcerations. Arterial insufficiency. History of Present Illness (HPI) Pleasant 70 year old with diabetes (no hemoglobin A1c available) and severe peripheral vascular disease. She reports a prior left lower extremity stent. Records unavailable. Underwent multiple level angioplasty of left lower extremity 10/29/2014 by Dr. Wyn Quaker. Developed left calf ulcerations in late December 2016. Denies any trauma. Seen by her PCP. Bactrim prescribed. Referred to the wound clinic. Biopsy culture 02/20/2015 grew methicillin sensitive staph aureus, sensitive to Bactrim. Performing dressing changes with Prisma. Using a Tubigrip for edema control. Scheduled to see Dr. Wyn Quaker in follow-up 02/25/2015. Cancelled because of weather. Rescheduled for 03/06/2015. She returns to clinic for follow-up and is without new complaints. Pain improved. No ischemic rest pain. No fever or chills. Minimal drainage. 03/13/2015 -- records obtained from [facility] shows that she was seen by Dr. dew on 03/12/2015 and given tramadol and other symptomatic treatment. Recent ABI done showed right to be 0.94 and left to be 0.47 a left lower extremity arterial duplex was notable for an occluded stent and distal ATA. Recommendations were for left lower extremity angiogram with intervention. on 03/11/2015 she was taken up for a aortogram with angioplasty of the left  posterior tibial artery and tibioperoneal trunk, above-knee popliteal artery and almost entire SFA, stent placement to the SFA and popliteal artery. 03/20/15 her arterial interventions noted. The patient states her left leg feels better. He ischemic wound on her left anterior leg. She has been using Santyl Objective Constitutional Kenyon, Tynslee N. ([id number]) Vitals Time Taken: 2:15 PM, Height: 67 in, Weight: 302 lbs, BMI: 47.3, Temperature: 98 F, Pulse: 70 bpm, Respiratory Rate: 16 breaths/min, Blood Pressure: 140/80 mmHg. Integumentary (Hair, Skin) Wound #2 status is Open. Original cause of wound was Blister. The wound is located on the Left,Medial Lower Leg. The wound measures 1cm length x 2cm width x 0.2cm depth; 1.571cm^2 area and 0.314cm^3 volume. The wound is limited to skin breakdown. There is no tunneling or undermining noted. There is a medium amount of serosanguineous drainage noted. The wound margin is flat and intact. There is small (1-33%) pink, pale granulation within the wound bed. There is a large (67-100%) amount of necrotic tissue within the wound bed including Adherent Slough. The periwound skin appearance exhibited: Localized Edema, Moist. The periwound skin appearance  did not exhibit: Callus, Crepitus, Excoriation, Fluctuance, Friable, Induration, Rash, Scarring, Dry/Scaly, Maceration, Atrophie Blanche, Cyanosis, Ecchymosis, Hemosiderin Staining, Mottled, Pallor, Rubor, Erythema. The periwound has tenderness on palpation. 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 There was a Skin/Subcutaneous Tissue Debridement ([id number]) debridement with total area of 2 sq cm performed by Maxwell Caul, MD. with the following instrument(s): Curette to remove Non-Viable tissue/material including Exudate, Fibrin/Slough, and Subcutaneous after achieving pain control using Lidocaine 4% Topical Solution. A time out was conducted prior to the start of the procedure. A Minimum amount of bleeding was controlled with Pressure. The procedure was tolerated well with a pain level of 0 throughout and a pain level of 0 following the procedure. Post Debridement Measurements: 1cm length x 2cm width x 0.2cm depth; 0.314cm^3 volume. Post procedure Diagnosis Wound #2: Same as Pre-Procedure Staszewski, Denna N. ([id number]) Plan Wound Cleansing: Wound #2 Left,Medial Lower Leg: Clean wound with wound cleanser. Anesthetic: Wound #2 Left,Medial Lower Leg: Topical Lidocaine 4% cream applied to wound bed prior to debridement Primary Wound Dressing: Wound #2 Left,Medial Lower Leg: Santyl Ointment Secondary Dressing: Wound #2 Left,Medial Lower Leg: Conform/Kerlix - telfa Dressing Change Frequency: Wound #2 Left,Medial Lower Leg: Change dressing every day. Follow-up Appointments: Wound #2 Left,Medial Lower Leg: Return Appointment in 1 week. Edema Control: Wound #2 Left,Medial Lower Leg: Elevate legs to the level of the heart and pump ankles as often as possible Tubigrip Home Health: Wound #2 Left,Medial Lower Leg: Continue Home Health Visits Home Health Nurse may visit PRN to address patient s wound care needs. FACE TO FACE ENCOUNTER: MEDICARE and MEDICAID PATIENTS: I certify that this patient is under my care and that I had a face-to-face encounter that meets the physician face-to-face encounter requirements with this patient on this date. The encounter with the patient was in whole or in part for the following MEDICAL CONDITION: (primary reason for Home Healthcare) MEDICAL  NECESSITY: I certify, that based on my findings, NURSING services are a medically necessary home health service. HOME BOUND STATUS: I certify that my clinical findings support that this patient is homebound (i.e., Due to illness or injury, pt requires aid of supportive devices such as crutches, cane, wheelchairs, walkers, the use of special transportation or the assistance of another person to leave their place of residence. There is a normal inability to leave the home and doing so requires considerable and taxing effort. Other absences are for medical reasons / religious services and are infrequent or of short duration when for other reasons). If current dressing causes regression in wound condition, may D/C ordered dressing product/s and apply Normal Saline Moist Dressing daily until next Wound Healing Center / Other MD appointment. Notify Wound Healing Center of regression in wound condition at [phone]. Please direct any NON-WOUND related issues/requests for orders to patient's Primary Care Physician MIRCA, YALE ([id number]) #1 continue Santyl based dressings. May be able to change to collagen next week. As she has severe PAD kerlix wraps only. Home health is Management consultant) Signed: 03/27/2015 5:51:40 PM By: Baltazar Najjar MD Entered By: Baltazar Najjar on 03/27/2015 15:00:50 Storie, Raneen N. (  [id number]) -------------------------------------------------------------------------------- SuperBill Details Patient Name: MELESSIA, KAUS Date of Service: 03/27/2015 Medical Record Patient Account Number: [id number] Number: Treating RN: Clover Mealy, RN, BSN, Rita 1945/03/06 [phone] y.o. 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, left I70.322 leg Facility Procedures CPT4: Description Modifier Quantity Code [id number] 11042 - DEB SUBQ TISSUE 20 SQ CM/< 1 ICD-10 Description Diagnosis 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

## 2015-03-28 NOTE — Progress Notes (Signed)
KACEE, SUKHU ([id number]) Visit Report for 03/27/2015 Arrival Information Details Patient Name: [name], [name] Date of Service: 03/27/2015 2:15 PM Medical Record Patient Account Number: [id number] Number: Treating RN: Clover Mealy, RN, BSN, Rita 1945/04/18 [phone] y.o. Other Clinician: Date of Birth/Sex: Female) Treating ROBSON, MICHAEL Primary Care Physician: Sherrie Mustache Physician/Extender: G Referring Physician: Sherrie Mustache Weeks in Treatment: 5 Visit Information History Since Last Visit Added or deleted any medications: No Patient Arrived: Wheel Chair Any new allergies or adverse reactions: No Arrival Time: 14:14 Had a fall or experienced change in No Accompanied By: self activities of daily living that may affect Transfer Assistance: None risk of falls: Patient Identification Verified: Yes Signs or symptoms of abuse/neglect since last No Secondary Verification Process Yes visito Completed: Hospitalized since last visit: No Patient Requires Transmission- No Has Dressing in Place as Prescribed: Yes Based Precautions: Pain Present Now: No Patient Has Alerts: Yes Patient Alerts: Patient on Blood Thinner plavix, aspirin  DMII Electronic Signature(s) Signed: 03/27/2015 5:19:57 PM By: Elpidio Eric BSN, RN Entered By: Elpidio Eric on 03/27/2015 14:14:54 Harshman, [name] ([id number]) -------------------------------------------------------------------------------- Encounter Discharge Information Details Patient Name: [name] Date of Service: 03/27/2015 2:15 PM Medical Record Patient Account Number: [id number] Number: Treating RN: Clover Mealy, RN, BSN, Rita 1945-08-21 [phone] y.o. Other Clinician: Date of Birth/Sex: Female) Treating ROBSON, MICHAEL Primary Care Physician: Sherrie Mustache Physician/Extender: G Referring Physician: Sherrie Mustache Weeks in Treatment: 5 Encounter Discharge Information Items Discharge Pain Level: 0 Discharge Condition:  Stable Ambulatory Status: Wheelchair Discharge Destination: Home Transportation: Private Auto Accompanied By: nephew Schedule Follow-up Appointment: No Medication Reconciliation completed No and provided to Patient/Care Khaza Blansett: Provided on Clinical Summary of Care: 03/27/2015 Form Type Recipient Paper Patient MW Electronic Signature(s) Signed: 03/27/2015 2:53:17 PM By: Gwenlyn Perking Previous Signature: 03/27/2015 2:22:57 PM Version By: Elpidio Eric BSN, RN Entered By: Gwenlyn Perking on 03/27/2015 14:53:17 Rudnicki, [name] ([id number]) -------------------------------------------------------------------------------- Lower Extremity Assessment Details Patient Name: [name] Date of Service: 03/27/2015 2:15 PM Medical Record Patient Account Number: [id number] Number: Treating RN: Clover Mealy, RN, BSN, Rita 1945/10/26 [phone] y.o. Other Clinician: Date of Birth/Sex: Female) Treating ROBSON, MICHAEL Primary Care Physician: Sherrie Mustache Physician/Extender: G Referring Physician: Sherrie Mustache Weeks in Treatment: 5 Vascular Assessment Pulses: Posterior Tibial Dorsalis Pedis Palpable: [Left:Yes] Extremity colors, hair growth, and conditions: Extremity Color: [Left:Normal] Hair Growth on Extremity: [Left:No] Temperature of Extremity: [Left:Warm] Capillary Refill: [Left:< 3 seconds] Toe Nail Assessment Left: Right: Thick: Yes Discolored: Yes Deformed: No Improper Length and Hygiene: Yes Electronic Signature(s) Signed: 03/27/2015 2:22:09 PM By: Elpidio Eric BSN, RN Entered By: Elpidio Eric on 03/27/2015 14:22:08 [name] ([id number]) -------------------------------------------------------------------------------- Multi Wound Chart Details Patient Name: [name] Date of Service: 03/27/2015 2:15 PM Medical Record Patient Account Number: [id number] Number: Treating RN: Clover Mealy, RN, BSN, Rita 02-20-1945 [phone] y.o. 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] Comorbid History: [2:Hypertension, Peripheral N/A Arterial Disease, Type II Diabetes, Osteoarthritis, Neuropathy] Date Acquired: [2:01/28/2015] [N/A:N/A] Weeks of Treatment: [2:5] [N/A:N/A] Wound Status: [2:Open] [N/A:N/A] Measurements L x W x D 1x2x0.2 [N/A:N/A] (cm) Area (cm) : [2:1.571] [N/A:N/A] Volume (cm) : [2:0.314] [N/A:N/A] %  Reduction in Area: [2:-178.10%] [N/A:N/A] % Reduction in Volume: -177.90% [N/A:N/A] Classification: [2:Full Thickness Without Exposed Support Structures] [N/A:N/A] HBO Classification: [2:Grade 1] [N/A:N/A] Exudate Amount: [2:Medium] [N/A:N/A] Exudate Type: [2:Serosanguineous] [N/A:N/A] Exudate Color: [2:red, brown] [N/A:N/A] Wound Margin: [2:Flat and Intact] [N/A:N/A] Granulation Amount: [2:Small (1-33%)] [N/A:N/A] Granulation Quality: [2:Pink, Pale] [N/A:N/A] Necrotic Amount: [2:Large (67-100%)] [N/A:N/A] Exposed Structures: Fascia: No N/A N/A Fat: No Tendon: No Muscle: No Joint: No Bone: No Limited to Skin Breakdown Epithelialization: None N/A N/A Periwound Skin Texture: Edema: Yes N/A N/A Excoriation: No Induration: No Callus: No Crepitus: No Fluctuance: No Friable: No Rash: No Scarring: No Periwound Skin Moist: Yes N/A N/A Moisture: Maceration: No Dry/Scaly: No Periwound Skin Color: Atrophie Blanche: No N/A N/A Cyanosis: No Ecchymosis: No Erythema: No Hemosiderin Staining: No Mottled: No Pallor: No Rubor: No Tenderness on Yes N/A  N/A Palpation: Wound Preparation: Ulcer Cleansing: N/A N/A Rinsed/Irrigated with Saline Topical Anesthetic Applied: Other: lidocaine 4% Treatment Notes Electronic Signature(s) Signed: 03/27/2015 2:24:00 PM By: Elpidio Eric BSN, RN Entered By: Elpidio Eric on 03/27/2015 14:23:59 [name], [name] ([id number]) -------------------------------------------------------------------------------- Multi-Disciplinary Care Plan Details Patient Name: [name] Date of Service: 03/27/2015 2:15 PM Medical Record Patient Account Number: [id number] Number: Treating RN: Clover Mealy, RN, BSN, Rita 05/24/1945 [phone] y.o. Other Clinician: Date of Birth/Sex: Female) Treating ROBSON, MICHAEL Primary Care Physician: Sherrie Mustache Physician/Extender: G Referring Physician: Sherrie Mustache Weeks in Treatment: 5 Active Inactive Abuse / Safety / Falls / Self Care Management Nursing Diagnoses: Potential for falls Goals: Patient will remain injury free Date Initiated: 02/20/2015 Goal Status: Active Interventions: Assess fall risk on admission and as needed Notes: Nutrition Nursing Diagnoses: Potential for alteratiion in Nutrition/Potential for imbalanced nutrition Goals: Patient/caregiver will maintain therapeutic glucose control Date Initiated: 02/20/2015 Goal Status: Active Interventions: Assess patient nutrition upon admission and as needed per policy Notes: Orientation to the Wound Care Program Nursing Diagnoses: Knowledge deficit related to the wound healing center program Goals: [name], [name] ([id number]) Patient/caregiver will verbalize understanding of the Wound Healing Center Program Date Initiated: 02/20/2015 Goal Status: Active Interventions: Provide education on orientation to the wound center Notes: Wound/Skin Impairment Nursing Diagnoses: Impaired tissue integrity Goals: Ulcer/skin breakdown will heal within 14 weeks Date Initiated: 02/20/2015 Goal Status:  Active Interventions: Assess ulceration(s) every visit Notes: Electronic Signature(s) Signed: 03/27/2015 2:23:52 PM By: Elpidio Eric BSN, RN Entered By: Elpidio Eric on 03/27/2015 14:23:52 [name], [name] ([id number]) -------------------------------------------------------------------------------- Pain Assessment Details Patient Name: [name] Date of Service: 03/27/2015 2:15 PM Medical Record Patient Account Number: [id number] Number: Treating RN: Clover Mealy, RN, BSN, Rita 1945-04-25 [phone] y.o. Other Clinician: Date of Birth/Sex: Female) Treating ROBSON, MICHAEL Primary Care Physician: Sherrie Mustache Physician/Extender: G Referring Physician: Sherrie Mustache Weeks in Treatment: 5 Active Problems Location of Pain Severity and Description of Pain Patient Has Paino No Site Locations Pain Management and Medication Current Pain Management: Electronic Signature(s) Signed: 03/27/2015 5:19:57 PM By: Elpidio Eric BSN, RN Entered By: Elpidio Eric on 03/27/2015 14:15:05 [name], [name] ([id number]) -------------------------------------------------------------------------------- Patient/Caregiver Education Details Patient Name: [name] Date of Service: 03/27/2015 2:15 PM Medical Record Patient Account Number: [id number] Number: Treating RN: Clover Mealy, RN, BSN, Rita 1945/10/16 [phone] y.o. Other Clinician: Date of Birth/Gender: Female) Treating ROBSON, MICHAEL Primary Care Physician: Sherrie Mustache Physician/Extender: G Referring Physician: Augustin Coupe in Treatment: 5 Education Assessment Education Provided To: Patient Education Topics Provided Welcome To The Wound Care Center: Methods: Explain/Verbal Responses: State content correctly Wound/Skin Impairment: Methods: Explain/Verbal Responses: State content correctly  Electronic Signature(s) Signed: 03/27/2015 2:23:16 PM By: Elpidio Eric BSN, RN Entered By: Elpidio Eric on 03/27/2015 14:23:16 [name], [name]  ([id number]) -------------------------------------------------------------------------------- Wound Assessment Details Patient Name: [name] Date of Service: 03/27/2015 2:15 PM Medical Record Patient Account Number: [id number] Number: Treating RN: Clover Mealy, RN, BSN, Rita November 21, 1945 [phone] y.o. Other Clinician: Date of Birth/Sex: Female) Treating ROBSON, MICHAEL Primary Care Physician: Sherrie Mustache Physician/Extender: G Referring Physician: Sherrie Mustache Weeks in Treatment: 5 Wound Status Wound Number: 2 Primary Arterial Insufficiency Ulcer Etiology: Wound Location: Left Lower Leg - Medial Wound Open Wounding Event: Blister Status: Date Acquired: 01/28/2015 Comorbid Hypertension, Peripheral Arterial Weeks Of Treatment: 5 History: Disease, Type II Diabetes, Clustered Wound: No Osteoarthritis, Neuropathy Photos Photo Uploaded By: Elpidio Eric on 03/27/2015 17:19:42 Wound Measurements Length: (cm) 1 Width: (cm) 2 Depth: (cm) 0.2 Area: (cm) 1.571 Volume: (cm) 0.314 % Reduction in Area: -178.1% % Reduction in Volume: -177.9% Epithelialization: None Tunneling: No Undermining: No Wound Description Full Thickness Without Exposed Classification: Support Structures [name], [name] ([id number]) Foul Odor After Cleansing: No Diabetic Severity Grade 1 (Wagner): Wound Margin: Flat and Intact Exudate Amount: Medium Exudate Type: Serosanguineous Exudate Color: red, brown Wound Bed Granulation Amount: Small (1-33%) Exposed Structure Granulation Quality: Pink, Pale Fascia Exposed: No Necrotic Amount: Large (67-100%) Fat Layer Exposed: No Necrotic Quality: Adherent Slough Tendon Exposed: No Muscle Exposed: No Joint Exposed: No Bone Exposed: No Limited to Skin Breakdown Periwound Skin Texture Texture Color No Abnormalities Noted: No No Abnormalities Noted: No Callus: No Atrophie Blanche: No Crepitus: No Cyanosis: No Excoriation: No Ecchymosis:  No Fluctuance: No Erythema: No Friable: No Hemosiderin Staining: No Induration: No Mottled: No Localized Edema: Yes Pallor: No Rash: No Rubor: No Scarring: No Temperature / Pain Moisture Tenderness on Palpation: Yes No Abnormalities Noted: No Dry / Scaly: No Maceration: No Moist: Yes Wound Preparation Ulcer Cleansing: Rinsed/Irrigated with Saline Topical Anesthetic Applied: Other: lidocaine 4%, Treatment Notes Wound #2 (Left, Medial Lower Leg) 1. Cleansed with: Clean wound with Normal Saline 4. Dressing Applied: Santyl Ointment 5. Secondary Dressing Applied SURAIYA, [name] ([id number]) [place] Notes telfa Electronic Signature(s) Signed: 03/27/2015 2:21:23 PM By: Elpidio Eric BSN, RN Entered By: Elpidio Eric on 03/27/2015 14:21:22 [name], [name] ([id number]) -------------------------------------------------------------------------------- Vitals Details Patient Name: [name] Date of Service: 03/27/2015 2:15 PM Medical Record Patient Account Number: [id number] Number: Treating RN: Clover Mealy, RN, BSN, Rita 1946/01/14 [phone] y.o. Other Clinician: Date of Birth/Sex: Female) Treating ROBSON, MICHAEL Primary Care Physician: Sherrie Mustache Physician/Extender: G Referring Physician: Sherrie Mustache Weeks in Treatment: 5 Vital Signs Time Taken: 14:15 Temperature (F): 98 Height (in): 67 Pulse (bpm): 70 Weight (lbs): 302 Respiratory Rate (breaths/min): 16 Body Mass Index (BMI): 47.3 Blood Pressure (mmHg): 140/80 Reference Range: 80 - 120 mg / dl Electronic Signature(s) Signed: 03/27/2015 5:19:57 PM By: Elpidio Eric BSN, RN Entered By: Elpidio Eric on 03/27/2015 14:19:38

## 2015-03-30 DIAGNOSIS — L97221 Non-pressure chronic ulcer of left calf limited to breakdown of skin: Secondary | ICD-10-CM | POA: Diagnosis not present

## 2015-03-30 DIAGNOSIS — E1151 Type 2 diabetes mellitus with diabetic peripheral angiopathy without gangrene: Secondary | ICD-10-CM | POA: Diagnosis not present

## 2015-03-30 DIAGNOSIS — L97229 Non-pressure chronic ulcer of left calf with unspecified severity: Secondary | ICD-10-CM | POA: Diagnosis not present

## 2015-03-30 DIAGNOSIS — I70342 Atherosclerosis of unspecified type of bypass graft(s) of the left leg with ulceration of calf: Secondary | ICD-10-CM | POA: Diagnosis not present

## 2015-03-31 DIAGNOSIS — I70342 Atherosclerosis of unspecified type of bypass graft(s) of the left leg with ulceration of calf: Secondary | ICD-10-CM | POA: Diagnosis not present

## 2015-03-31 DIAGNOSIS — E1151 Type 2 diabetes mellitus with diabetic peripheral angiopathy without gangrene: Secondary | ICD-10-CM | POA: Diagnosis not present

## 2015-03-31 DIAGNOSIS — L97229 Non-pressure chronic ulcer of left calf with unspecified severity: Secondary | ICD-10-CM | POA: Diagnosis not present

## 2015-03-31 DIAGNOSIS — L97221 Non-pressure chronic ulcer of left calf limited to breakdown of skin: Secondary | ICD-10-CM | POA: Diagnosis not present

## 2015-04-01 DIAGNOSIS — I70342 Atherosclerosis of unspecified type of bypass graft(s) of the left leg with ulceration of calf: Secondary | ICD-10-CM | POA: Diagnosis not present

## 2015-04-01 DIAGNOSIS — L97229 Non-pressure chronic ulcer of left calf with unspecified severity: Secondary | ICD-10-CM | POA: Diagnosis not present

## 2015-04-01 DIAGNOSIS — E1151 Type 2 diabetes mellitus with diabetic peripheral angiopathy without gangrene: Secondary | ICD-10-CM | POA: Diagnosis not present

## 2015-04-01 DIAGNOSIS — L97221 Non-pressure chronic ulcer of left calf limited to breakdown of skin: Secondary | ICD-10-CM | POA: Diagnosis not present

## 2015-04-02 ENCOUNTER — Encounter: Payer: Medicare Other | Admitting: Internal Medicine

## 2015-04-02 DIAGNOSIS — L97821 Non-pressure chronic ulcer of other part of left lower leg limited to breakdown of skin: Secondary | ICD-10-CM | POA: Diagnosis not present

## 2015-04-02 DIAGNOSIS — I739 Peripheral vascular disease, unspecified: Secondary | ICD-10-CM | POA: Diagnosis not present

## 2015-04-02 DIAGNOSIS — I70342 Atherosclerosis of unspecified type of bypass graft(s) of the left leg with ulceration of calf: Secondary | ICD-10-CM | POA: Diagnosis not present

## 2015-04-02 DIAGNOSIS — E1140 Type 2 diabetes mellitus with diabetic neuropathy, unspecified: Secondary | ICD-10-CM | POA: Diagnosis not present

## 2015-04-02 DIAGNOSIS — M1990 Unspecified osteoarthritis, unspecified site: Secondary | ICD-10-CM | POA: Diagnosis not present

## 2015-04-02 DIAGNOSIS — L97221 Non-pressure chronic ulcer of left calf limited to breakdown of skin: Secondary | ICD-10-CM | POA: Diagnosis not present

## 2015-04-02 DIAGNOSIS — I10 Essential (primary) hypertension: Secondary | ICD-10-CM | POA: Diagnosis not present

## 2015-04-02 DIAGNOSIS — E11622 Type 2 diabetes mellitus with other skin ulcer: Secondary | ICD-10-CM | POA: Diagnosis not present

## 2015-04-02 DIAGNOSIS — I70322 Atherosclerosis of unspecified type of bypass graft(s) of the extremities with rest pain, left leg: Secondary | ICD-10-CM | POA: Diagnosis not present

## 2015-04-04 NOTE — Progress Notes (Signed)
[name], [name] Visit Report for 04/02/2015 Arrival Information Details Patient Name: [name], [name] Date of Service: 04/02/2015 2:00 PM Medical Record Patient Account Number: [id number] Number: Treating RN: Phillis Haggis Apr 05, 1945 (69 y.o. Other Clinician: Date of Birth/Sex: Female) Treating ROBSON, MICHAEL Primary Care Physician: Sherrie Mustache Physician/Extender: G Referring Physician: Sherrie Mustache Weeks in Treatment: 5 Visit Information History Since Last Visit All ordered tests and consults were completed: No Patient Arrived: Wheel Chair Added or deleted any medications: No Arrival Time: 14:11 Any new allergies or adverse reactions: No Accompanied By: brother Had a fall or experienced change in No Transfer Assistance: None activities of daily living that may affect Patient Identification Verified: Yes risk of falls: Secondary Verification Process Yes Signs or symptoms of abuse/neglect since last No Completed: visito Patient Requires Transmission- No Hospitalized since last visit: No Based Precautions: Pain Present Now: No Patient Has Alerts: Yes Patient Alerts: Patient on Blood Thinner plavix, aspirin 81mg  DMII ABI right 0.94 ABI left 1.16 Electronic Signature(s) Signed: 04/02/2015 4:05:48 PM By: Alejandro Mulling Entered By: Alejandro Mulling on 04/02/2015 14:39:16 Garms, [name] ([id number]) -------------------------------------------------------------------------------- Encounter Discharge Information Details Patient Name: [name] Date of Service: 04/02/2015 2:00 PM Medical Record Patient Account Number: [id number] Number: Treating RN: Phillis Haggis 24-Sep-1945 (69 y.o. Other Clinician: Date of Birth/Sex: Female) Treating ROBSON, MICHAEL Primary Care Physician: Sherrie Mustache Physician/Extender: G Referring Physician: Sherrie Mustache Weeks in Treatment: 5 Encounter Discharge Information Items Discharge Pain Level:  0 Discharge Condition: Stable Ambulatory Status: Wheelchair Discharge Destination: Home Transportation: Private Auto brother and Accompanied By: cousin Schedule Follow-up Appointment: Yes Medication Reconciliation completed and provided to Patient/Care Yes [name]: Provided on Clinical Summary of Care: 04/02/2015 Form Type Recipient Paper Patient MW Electronic Signature(s) Signed: 04/02/2015 3:02:29 PM By: Gwenlyn Perking Entered By: Gwenlyn Perking on 04/02/2015 15:02:28 [name], [name] ([id number]) -------------------------------------------------------------------------------- Lower Extremity Assessment Details Patient Name: [name] Date of Service: 04/02/2015 2:00 PM Medical Record Patient Account Number: [id number] Number: Treating RN: Phillis Haggis November 01, 1945 (69 y.o. Other Clinician: Date of Birth/Sex: Female) Treating ROBSON, MICHAEL Primary Care Physician: Sherrie Mustache Physician/Extender: G Referring Physician: Sherrie Mustache Weeks in Treatment: 5 Vascular Assessment Pulses: Posterior Tibial Dorsalis Pedis Palpable: [Left:Yes] Extremity colors, hair growth, and conditions: Extremity Color: [Left:Normal] Temperature of Extremity: [Left:Warm] Capillary Refill: [Left:< 3 seconds] Toe Nail Assessment Left: Right: Thick: Yes Discolored: Yes Deformed: No Improper Length and Hygiene: Yes Electronic Signature(s) Signed: 04/02/2015 4:05:48 PM By: Alejandro Mulling Entered By: Alejandro Mulling on 04/02/2015 14:16:08 [name], [name] ([id number]) -------------------------------------------------------------------------------- Multi Wound Chart Details Patient Name: [name] Date of Service: 04/02/2015 2:00 PM Medical Record Patient Account Number: [id number] Number: Treating RN: Phillis Haggis 18-Feb-1945 (69 y.o. 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] Comorbid History: [2:Hypertension, Peripheral N/A Arterial Disease, Type II Diabetes, Osteoarthritis, Neuropathy] Date Acquired: [2:01/28/2015] [N/A:N/A] Weeks of Treatment: [2:5] [N/A:N/A] Wound Status: [2:Open] [N/A:N/A] Measurements L x W x D 1x1.9x0.2 [N/A:N/A] (cm) Area (cm) : [2:1.492] [N/A:N/A] Volume (cm) : [2:0.298] [N/A:N/A] % Reduction in Area: [2:-164.10%] [N/A:N/A] % Reduction in Volume: -163.70% [N/A:N/A] Classification: [2:Full Thickness Without Exposed Support Structures] [  N/A:N/A] HBO Classification: [2:Grade 1] [N/A:N/A] Exudate Amount: [2:Medium] [N/A:N/A] Exudate Type: [2:Serosanguineous] [N/A:N/A] Exudate Color: [2:red, brown] [N/A:N/A] Wound Margin: [2:Flat and Intact] [N/A:N/A] Granulation Amount: [2:Medium (34-66%)] [N/A:N/A] Granulation Quality: [2:Pink, Pale] [N/A:N/A] Necrotic Amount: [2:Medium (34-66%)] [N/A:N/A] Exposed Structures: Fascia: No N/A N/A Fat: No Tendon: No Muscle: No Joint: No Bone: No Limited to Skin Breakdown Epithelialization: None N/A N/A Periwound Skin Texture: Edema: Yes N/A N/A Excoriation: No Induration: No Callus: No Crepitus: No Fluctuance: No Friable: No Rash: No Scarring: No Periwound Skin Moist: Yes N/A N/A Moisture: Maceration: No Dry/Scaly: No Periwound Skin Color: Atrophie Blanche: No N/A N/A Cyanosis: No Ecchymosis: No Erythema: No Hemosiderin Staining: No Mottled: No Pallor: No Rubor: No Tenderness on Yes N/A N/A Palpation: Wound Preparation: Ulcer Cleansing:  N/A N/A Rinsed/Irrigated with Saline Topical Anesthetic Applied: Other: lidocaine 4% Treatment Notes Electronic Signature(s) Signed: 04/02/2015 4:05:48 PM By: Alejandro Mulling Entered By: Alejandro Mulling on 04/02/2015 14:19:31 [name], [name] ([id number]) -------------------------------------------------------------------------------- Multi-Disciplinary Care Plan Details Patient Name: [name] Date of Service: 04/02/2015 2:00 PM Medical Record Patient Account Number: [id number] Number: Treating RN: Phillis Haggis [birth year] (69 y.o. Other Clinician: Date of Birth/Sex: Female) Treating ROBSON, MICHAEL Primary Care Physician: Sherrie Mustache Physician/Extender: G Referring Physician: Sherrie Mustache Weeks in Treatment: 5 Active Inactive Abuse / Safety / Falls / Self Care Management Nursing Diagnoses: Potential for falls Goals: Patient will remain injury free Date Initiated: 02/20/2015 Goal Status: Active Interventions: Assess fall risk on admission and as needed Notes: Nutrition Nursing Diagnoses: Potential for alteratiion in Nutrition/Potential for imbalanced nutrition Goals: Patient/caregiver will maintain therapeutic glucose control Date Initiated: 02/20/2015 Goal Status: Active Interventions: Assess patient nutrition upon admission and as needed per policy Notes: Orientation to the Wound Care Program Nursing Diagnoses: Knowledge deficit related to the wound healing center program Goals: [name], [name] ([id number]) Patient/caregiver will verbalize understanding of the Wound Healing Center Program Date Initiated: 02/20/2015 Goal Status: Active Interventions: Provide education on orientation to the wound center Notes: Wound/Skin Impairment Nursing Diagnoses: Impaired tissue integrity Goals: Ulcer/skin breakdown will heal within 14 weeks Date Initiated: 02/20/2015 Goal Status: Active Interventions: Assess ulceration(s) every visit Notes: Electronic  Signature(s) Signed: 04/02/2015 4:05:48 PM By: Alejandro Mulling Entered By: Alejandro Mulling on 04/02/2015 14:19:23 [name], [name] ([id number]) -------------------------------------------------------------------------------- Pain Assessment Details Patient Name: [name] Date of Service: 04/02/2015 2:00 PM Medical Record Patient Account Number: [id number] Number: Treating RN: Phillis Haggis Apr 12, 1945 (69 y.o. Other Clinician: Date of Birth/Sex: Female) Treating ROBSON, MICHAEL Primary Care Physician: Sherrie Mustache Physician/Extender: G Referring Physician: Sherrie Mustache Weeks in Treatment: 5 Active Problems Location of Pain Severity and Description of Pain Patient Has Paino No Site Locations Pain Management and Medication Current Pain Management: Electronic Signature(s) Signed: 04/02/2015 4:05:48 PM By: Alejandro Mulling Entered By: Alejandro Mulling on 04/02/2015 14:12:23 [name], [name] ([id number]) -------------------------------------------------------------------------------- Patient/Caregiver Education Details Patient Name: [name] Date of Service: 04/02/2015 2:00 PM Medical Record Patient Account Number: [id number] Number: Treating RN: Phillis Haggis 1945/04/22 (69 y.o. Other Clinician: Date of Birth/Gender: Female) Treating ROBSON, MICHAEL Primary Care Physician: Sherrie Mustache Physician/Extender: G Referring Physician: Augustin Coupe in Treatment: 5 Education Assessment Education Provided To: Patient Education Topics Provided Wound/Skin Impairment: Handouts: Other: do not get wrap wet Methods: Demonstration, Explain/Verbal Responses: State content correctly Electronic Signature(s) Signed: 04/02/2015 4:05:48 PM By: Alejandro Mulling Entered By: Alejandro Mulling on 04/02/2015 15:01:17 [name], [name] ([id number]) -------------------------------------------------------------------------------- Wound Assessment Details Patient  Name: [name] Date of Service: 04/02/2015  2:00 PM Medical Record Patient Account Number: [id number] Number: Treating RN: Phillis Haggis [birth year] (69 y.o. Other Clinician: Date of Birth/Sex: Female) Treating ROBSON, MICHAEL Primary Care Physician: Sherrie Mustache Physician/Extender: G Referring Physician: Sherrie Mustache Weeks in Treatment: 5 Wound Status Wound Number: 2 Primary Arterial Insufficiency Ulcer Etiology: Wound Location: Left Lower Leg - Medial Wound Open Wounding Event: Blister Status: Date Acquired: 01/28/2015 Comorbid Hypertension, Peripheral Arterial Weeks Of Treatment: 5 History: Disease, Type II Diabetes, Clustered Wound: No Osteoarthritis, Neuropathy Photos Photo Uploaded By: Alejandro Mulling on 04/02/2015 15:25:44 Wound Measurements Length: (cm) 1 Width: (cm) 1.9 Depth: (cm) 0.2 Area: (cm) 1.492 Volume: (cm) 0.298 % Reduction in Area: -164.1% % Reduction in Volume: -163.7% Epithelialization: None Wound Description Full Thickness Without Exposed Classification: Support Structures Diabetic Severity Grade 1 (Wagner): Wound Margin: Flat and Intact Exudate Amount: Medium Exudate Type: Serosanguineous Exudate Color: red, brown Blumenfeld, Roneka N. ([id number]) Foul Odor After Cleansing: No Wound Bed Granulation Amount: Medium (34-66%) Exposed Structure Granulation Quality: Pink, Pale Fascia Exposed: No Necrotic Amount: Medium (34-66%) Fat Layer Exposed: No Necrotic Quality: Adherent Slough Tendon Exposed: No Muscle Exposed: No Joint Exposed: No Bone Exposed: No Limited to Skin Breakdown Periwound Skin Texture Texture Color No Abnormalities Noted: No No Abnormalities Noted: No Callus: No Atrophie Blanche: No Crepitus: No Cyanosis: No Excoriation: No Ecchymosis: No Fluctuance: No Erythema: No Friable: No Hemosiderin Staining: No Induration: No Mottled: No Localized Edema: Yes Pallor: No Rash: No Rubor:  No Scarring: No Temperature / Pain Moisture Tenderness on Palpation: Yes No Abnormalities Noted: No Dry / Scaly: No Maceration: No Moist: Yes Wound Preparation Ulcer Cleansing: Rinsed/Irrigated with Saline Topical Anesthetic Applied: Other: lidocaine 4%, Treatment Notes Wound #2 (Left, Medial Lower Leg) 1. Cleansed with: Clean wound with Normal Saline 2. Anesthetic Topical Lidocaine 4% cream to wound bed prior to debridement 4. Dressing Applied: Aquacel Ag 5. Secondary Dressing Applied ABD Pad 7. Secured with Tape 3 Layer Compression System - Left Lower Extremity DANNAE, KATO ([id number]) Electronic Signature(s) Signed: 04/02/2015 4:05:48 PM By: Alejandro Mulling Entered By: Alejandro Mulling on 04/02/2015 14:19:15 Zywicki, [name] ([id number]) -------------------------------------------------------------------------------- Vitals Details Patient Name: [name] Date of Service: 04/02/2015 2:00 PM Medical Record Patient Account Number: [id number] Number: Treating RN: Phillis Haggis Apr 06, 1945 (70 y.o. Other Clinician: Date of Birth/Sex: Female) Treating ROBSON, MICHAEL Primary Care Physician: Sherrie Mustache Physician/Extender: G Referring Physician: Sherrie Mustache Weeks in Treatment: 5 Vital Signs Time Taken: 14:12 Temperature (F): 98.1 Height (in): 67 Pulse (bpm): 73 Weight (lbs): 302 Respiratory Rate (breaths/min): 18 Body Mass Index (BMI): 47.3 Blood Pressure (mmHg): 150/65 Reference Range: 80 - 120 mg / dl Electronic Signature(s) Signed: 04/02/2015 4:05:48 PM By: Alejandro Mulling Entered By: Alejandro Mulling on 04/02/2015 14:14:32

## 2015-04-04 NOTE — Progress Notes (Signed)
[name] ([id number]) Visit Report for 04/02/2015 Chief Complaint Document Details Patient Name: [name], [name] Date of Service: 04/02/2015 2:00 PM Medical Record Patient Account Number: [id number] Number: Treating RN: Phillis Haggis Aug 31, 1945 (69 y.o. Other Clinician: Date of Birth/Sex: Female) Treating Latika Kronick Primary Care Physician/Extender: Kerry Kass, [facility] Physician: Referring Physician: Sherrie Mustache Weeks in Treatment: 5 Information Obtained from: Patient Chief Complaint Left calf ulcerations. Arterial insufficiency. Electronic Signature(s) Signed: 04/03/2015 5:04:49 PM By: Baltazar Najjar MD Entered By: Baltazar Najjar on 04/02/2015 14:48:15 Rotenberry, [name] -------------------------------------------------------------------------------- Debridement Details Patient Name: [name] Date of Service: 04/02/2015 2:00 PM Medical Record Patient Account Number: [id number] Number: Treating RN: Phillis Haggis January 27, 1946 (69 y.o. Other Clinician: Date of Birth/Sex: Female) Treating Kim Lauver Primary Care Physician/Extender: Kerry Kass, [facility] Physician: Referring Physician: Sherrie Mustache Weeks in Treatment: 5 Debridement Performed for Wound #2 Left,Medial Lower Leg Assessment: Performed By: Physician Maxwell Caul, MD Debridement: Debridement Pre-procedure Yes Verification/Time Out Taken: Start Time: 14:34 Pain Control: Other : lidocaine 4% cream Level: Skin/Subcutaneous Tissue Total Area Debrided (L x 1 (cm) x 1.9 (cm) = 1.9 (cm) W): Tissue and other Viable, Non-Viable, Exudate, Fibrin/Slough, Subcutaneous material debrided: Instrument: Curette Bleeding: Minimum Hemostasis Achieved: Pressure End Time: 14:36 Procedural Pain: 0 Post Procedural Pain: 0 Response to Treatment: Procedure was tolerated well Post Debridement Measurements of Total Wound Length: (cm) 1 Width: (cm) 1.9 Depth: (cm) 0.3 Volume:  (cm) 0.448 Post Procedure Diagnosis Same as Pre-procedure Electronic Signature(s) Signed: 04/02/2015 4:05:48 PM By: Alejandro Mulling Signed: 04/03/2015 5:04:49 PM By: Baltazar Najjar MD Entered By: Baltazar Najjar on 04/02/2015 14:47:48 [name], [name] ([id number]) [name] ([id number]) -------------------------------------------------------------------------------- HPI Details Patient Name: [name] Date of Service: 04/02/2015 2:00 PM Medical Record Patient Account Number: [id number] Number: Treating RN: Phillis Haggis Jul 12, 1945 (69 y.o. Other Clinician: Date of Birth/Sex: Female) Treating Yamila Cragin Primary Care Physician/Extender: Kerry Kass, [facility] Physician: Referring Physician: Sherrie Mustache Weeks in Treatment: 5 History of Present Illness HPI Description: Pleasant 70 year old with diabetes (no hemoglobin A1c available) and severe peripheral vascular disease. She reports a prior left lower extremity stent. Records unavailable. Underwent multiple level angioplasty of left lower extremity 10/29/2014 by Dr. Wyn Quaker. Developed left calf ulcerations in late December 2016. Denies any trauma. Seen by her PCP. Bactrim prescribed. Referred to the wound clinic. Biopsy culture 02/20/2015 grew methicillin sensitive staph aureus, sensitive to Bactrim. Performing dressing changes with Prisma. Using a Tubigrip for edema control. Scheduled to see Dr. Wyn Quaker in follow-up 02/25/2015. Cancelled because of weather. Rescheduled for 03/06/2015. She returns to clinic for follow-up and is without new complaints. Pain improved. No ischemic rest pain. No fever or chills. Minimal drainage. 03/13/2015 -- records obtained from [place] [facility] shows that she was seen by Dr. dew on 03/12/2015 and given tramadol and other symptomatic treatment. Recent ABI done showed right to be 0.94 and left to be 0.47 a left lower extremity arterial duplex was notable for an occluded stent  and distal ATA. Recommendations were for left lower extremity angiogram with intervention. on 03/11/2015 she was taken up for a aortogram with angioplasty of the left posterior tibial artery and tibioperoneal trunk, above-knee popliteal artery and almost entire SFA, stent placement to the SFA and popliteal artery. 03/20/15 her arterial interventions noted. The patient states her left leg feels better. He ischemic wound on her left anterior leg. She has been using Engineer, civil (consulting)) Signed: 04/03/2015 5:04:49 PM By: Baltazar Najjar MD Entered  By: Baltazar Najjar on 04/02/2015 14:49:37 [name], [name] ([id number]) -------------------------------------------------------------------------------- Physical Exam Details Patient Name: [name], [name] Date of Service: 04/02/2015 2:00 PM Medical Record Patient Account Number: [id number] Number: Treating RN: Phillis Haggis 01-Feb-1946 (69 y.o. Other Clinician: Date of Birth/Sex: Female) Treating Charnel Giles Primary Care Physician/Extender: Kerry Kass, [facility] Physician: Referring Physician: Sherrie Mustache Weeks in Treatment: 5 Constitutional Sitting or standing Blood Pressure is within target range for patient.. Pulse regular and within target range for patient.Marland Kitchen Respirations regular, non-labored and within target range.. Temperature is normal and within the target range for the patient.. Cardiovascular Heart rhythm and rate regular, without murmur or gallop. No evidence of CHF. Faint pedal pulse on the left. Edema present in both extremities. Left greater than right. There is several areas on the left anterior and posterior calf that are weeping fluid. I'm very concerned that this entire area may have multiple areas of breakdown. Gastrointestinal (GI) Abdomen is soft and non-distended without masses or tenderness. Bowel sounds active in all quadrants.. No liver or spleen enlargement or tenderness.. Notes Wound exam; the  area on the left anterior medial calf does not look too bad however it is weeping edema fluid. Surgical debridement done to remove surface slough and nonviable subcutaneous tissue. There are several areas around this wound that look like threatened breakdown. There is no evidence of cellulitis. I see no evidence of systemic fluid overload. The patient tells me that she has not taken her diuretic in over a week [supposed to be taking every second day]. Electronic Signature(s) Signed: 04/03/2015 5:04:49 PM By: Baltazar Najjar MD Entered By: Baltazar Najjar on 04/02/2015 14:51:44 [name], [name] ([id number]) -------------------------------------------------------------------------------- Physician Orders Details Patient Name: [name] Date of Service: 04/02/2015 2:00 PM Medical Record Patient Account Number: [id number] Number: Treating RN: Phillis Haggis 11/21/1945 (69 y.o. Other Clinician: Date of Birth/Sex: Female) Treating Gizel Riedlinger Primary Care Physician/Extender: Kerry Kass, [facility] Physician: Referring Physician: Sherrie Mustache Weeks in Treatment: 5 Verbal / Phone Orders: Yes Clinician: Pinkerton, Debi Read Back and Verified: Yes Diagnosis Coding Wound Cleansing Wound #2 Left,Medial Lower Leg o Cleanse wound with mild soap and water o May shower with protection. Anesthetic Wound #2 Left,Medial Lower Leg o Topical Lidocaine 4% cream applied to wound bed prior to debridement Skin Barriers/Peri-Wound Care Wound #2 Left,Medial Lower Leg o Barrier cream Primary Wound Dressing Wound #2 Left,Medial Lower Leg o Aquacel Ag Secondary Dressing Wound #2 Left,Medial Lower Leg o ABD pad Dressing Change Frequency Wound #2 Left,Medial Lower Leg o Other: - Change Tuesday and Friday Follow-up Appointments Wound #2 Left,Medial Lower Leg o Return Appointment in 1 week. Edema Control Wound #2 Left,Medial Lower Leg o 3 Layer Compression System - Left  Lower Extremity - use unna wrap to anchor Blume, Ismael N. ([id number]) o Elevate legs to the level of the heart and pump ankles as often as possible Home Health Wound #2 Left,Medial Lower Leg o Continue Home Health Visits - Please change wrap on Friday 04/05/15 please use Aqualcel and ABD pads to cover wound and blisters o Home Health Nurse may visit PRN to address patientos wound care needs. o FACE TO FACE ENCOUNTER: MEDICARE and MEDICAID PATIENTS: I certify that this patient is under my care and that I had a face-to-face encounter that meets the physician face-to-face encounter requirements with this patient on this date. The encounter with the patient was in whole or in part for the following MEDICAL CONDITION: (primary reason for Home Healthcare) MEDICAL NECESSITY:  I certify, that based on my findings, NURSING services are a medically necessary home health service. HOME BOUND STATUS: I certify that my clinical findings support that this patient is homebound (i.e., Due to illness or injury, pt requires aid of supportive devices such as crutches, cane, wheelchairs, walkers, the use of special transportation or the assistance of another person to leave their place of residence. There is a normal inability to leave the home and doing so requires considerable and taxing effort. Other absences are for medical reasons / religious services and are infrequent or of short duration when for other reasons). o If current dressing causes regression in wound condition, may D/C ordered dressing product/s and apply Normal Saline Moist Dressing daily until next Wound Healing Center / Other MD appointment. Notify Wound Healing Center of regression in wound condition at [phone]. o Please direct any NON-WOUND related issues/requests for orders to patient's Primary Care Physician Electronic Signature(s) Signed: 04/02/2015 4:05:48 PM By: Alejandro Mulling Signed: 04/03/2015 5:04:49 PM By: Baltazar Najjar MD Entered By: Alejandro Mulling on 04/02/2015 14:56:03 Kenley, [name] ([id number]) -------------------------------------------------------------------------------- Problem List Details Patient Name: [name] Date of Service: 04/02/2015 2:00 PM Medical Record Patient Account Number: [id number] Number: Treating RN: Phillis Haggis [birth year] (69 y.o. 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 Other Clinician: Date of Birth/Sex: Female) Treating Blonnie Maske Primary Care Physician/Extender: Kerry Kass, [facility] Physician: Referring Physician: Sherrie Mustache Weeks in Treatment: 5 Subjective Chief Complaint Information obtained from Patient Left calf ulcerations. Arterial insufficiency. History of Present Illness (HPI) Pleasant 70 year old with diabetes (no hemoglobin A1c available) and severe peripheral vascular  disease. She reports a prior left lower extremity stent. Records unavailable. Underwent multiple level angioplasty of left lower extremity 10/29/2014 by Dr. Wyn Quaker. Developed left calf ulcerations in late December 2016. Denies any trauma. Seen by her PCP. Bactrim prescribed. Referred to the wound clinic. Biopsy culture 02/20/2015 grew methicillin sensitive staph aureus, sensitive to Bactrim. Performing dressing changes with Prisma. Using a Tubigrip for edema control. Scheduled to see Dr. Wyn Quaker in follow-up 02/25/2015. Cancelled because of weather. Rescheduled for 03/06/2015. She returns to clinic for follow-up and is without new complaints. Pain improved. No ischemic rest pain. No fever or chills. Minimal drainage. 03/13/2015 -- records obtained from [place] [facility] shows that she was seen by Dr. dew on 03/12/2015 and given tramadol and other symptomatic treatment. Recent ABI done showed right to be 0.94 and left to be 0.47 a left lower extremity arterial duplex was notable for an occluded stent and distal ATA. Recommendations were for left lower extremity angiogram with intervention. on 03/11/2015 she was taken up for a aortogram with angioplasty of the left posterior tibial artery and tibioperoneal  trunk, above-knee popliteal artery and almost entire SFA, stent placement to the SFA and popliteal artery. 03/20/15 her arterial interventions noted. The patient states her left leg feels better. He ischemic wound on her left anterior leg. She has been using Santyl Objective Constitutional [name], [name] ([id number]) Sitting or standing Blood Pressure is within target range for patient.. Pulse regular and within target range for patient.Marland Kitchen Respirations regular, non-labored and within target range.. Temperature is normal and within the target range for the patient.. Vitals Time Taken: 2:12 PM, Height: 67 in, Weight: 302 lbs, BMI: 47.3, Temperature: 98.1 F, Pulse: 73 bpm, Respiratory Rate: 18  breaths/min, Blood Pressure: 150/65 mmHg. Cardiovascular Heart rhythm and rate regular, without murmur or gallop. No evidence of CHF. Faint pedal pulse on the left. Edema present in both extremities. Left greater than right. There is several areas on the left anterior and posterior calf that are weeping fluid. I'm very concerned that this entire area may have multiple areas of breakdown. Gastrointestinal (GI) Abdomen is soft and non-distended without masses or tenderness. Bowel sounds active in all quadrants.. No liver or spleen enlargement or tenderness.. General Notes: Wound exam; the area on the left anterior medial calf does not look too bad however it is weeping edema fluid. Surgical debridement done to remove surface slough and nonviable subcutaneous tissue. There are several areas around this wound that look like threatened breakdown. There is no evidence of cellulitis. I see no evidence of systemic fluid overload. The patient tells me that she has not taken her diuretic in over a week [supposed to be taking every second day]. Integumentary (Hair, Skin) Wound #2 status is Open. Original cause of wound was Blister. The wound is located on the Left,Medial Lower Leg. The wound measures 1cm length x 1.9cm width x 0.2cm depth; 1.492cm^2 area and 0.298cm^3 volume. The wound is limited to skin breakdown. There is a medium amount of serosanguineous drainage noted. The wound margin is flat and intact. There is medium (34-66%) pink, pale granulation within the wound bed. There is a medium (34-66%) amount of necrotic tissue within the wound bed including Adherent Slough. The periwound skin appearance exhibited: Localized Edema, Moist. The periwound skin appearance did not exhibit: Callus, Crepitus, Excoriation, Fluctuance, Friable, Induration, Rash, Scarring, Dry/Scaly, Maceration, Atrophie Blanche, Cyanosis, Ecchymosis, Hemosiderin Staining, Mottled, Pallor, Rubor, Erythema. The periwound has  tenderness on palpation. 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 ([id number]) Procedures Wound #2 Wound #2 is an Arterial Insufficiency Ulcer located on the Left,Medial Lower Leg . There was a Skin/Subcutaneous Tissue Debridement ([id number]) debridement with total area of 1.9 sq cm performed by Maxwell Caul, MD. with the following instrument(s): Curette to remove Viable and Non-Viable tissue/material including Exudate, Fibrin/Slough, and Subcutaneous after achieving pain control using Other (lidocaine 4% cream). A time out was conducted prior to the start of the procedure. A Minimum amount of bleeding was controlled with Pressure. The procedure was tolerated well with a pain level of 0 throughout and a pain level of 0 following the procedure. Post Debridement Measurements: 1cm length x 1.9cm width x 0.3cm depth; 0.448cm^3 volume. Post procedure Diagnosis Wound #2: Same as Pre-Procedure Plan Wound Cleansing: Wound #2 Left,Medial Lower Leg: Cleanse  wound with mild soap and water May shower with protection. Anesthetic: Wound #2 Left,Medial Lower Leg: Topical Lidocaine 4% cream applied to wound bed prior to debridement Skin Barriers/Peri-Wound Care: Wound #2 Left,Medial Lower Leg: Barrier cream Primary Wound Dressing: Wound #2 Left,Medial Lower Leg: Aquacel Ag Secondary Dressing: Wound #2 Left,Medial Lower Leg: ABD pad Dressing Change Frequency: Wound #2 Left,Medial Lower Leg: Other: - Change Tuesday and Friday Follow-up Appointments: Wound #2 Left,Medial Lower Leg: Return Appointment in 1 week. Edema Control: Wound #2 Left,Medial Lower Leg: 3  Layer Compression System - Left Lower Extremity Elevate legs to the level of the heart and pump ankles as often as possible Home Health: [name], [name] ([id number]) Wound #2 Left,Medial Lower Leg: Continue Home Health Visits - Please change wrap on Friday 04/05/15 Home Health Nurse may visit PRN to address patient s wound care needs. FACE TO FACE ENCOUNTER: MEDICARE and MEDICAID PATIENTS: I certify that this patient is under my care and that I had a face-to-face encounter that meets the physician face-to-face encounter requirements with this patient on this date. The encounter with the patient was in whole or in part for the following MEDICAL CONDITION: (primary reason for Home Healthcare) MEDICAL NECESSITY: I certify, that based on my findings, NURSING services are a medically necessary home health service. HOME BOUND STATUS: I certify that my clinical findings support that this patient is homebound (i.e., Due to illness or injury, pt requires aid of supportive devices such as crutches, cane, wheelchairs, walkers, the use of special transportation or the assistance of another person to leave their place of residence. There is a normal inability to leave the home and doing so requires considerable and taxing effort. Other absences are for medical reasons / religious services and are infrequent or of short duration when for other reasons). If current dressing causes regression in wound condition, may D/C ordered dressing product/s and apply Normal Saline Moist Dressing daily until next Wound Healing Center / Other MD appointment. Notify Wound Healing Center of regression in wound condition at [phone]. Please direct any NON-WOUND related issues/requests for orders to patient's Primary Care Physician #1 very concerning situation. She is at risk of multiple areas of skin breakdown on the left leg. I reviewed her recent arterial interventions. I elected to go ahead and attempt to wrap her with a  Profore light. We are going to apply Aquacel Ag to the wound and all threatened areas. We also applied ABD pads. I've asked her to go back on her diuretics. Electronic Signature(s) Signed: 04/03/2015 5:04:49 PM By: Baltazar Najjar MD Entered By: Baltazar Najjar on 04/02/2015 14:54:30 Giraud, [name] ([id number]) -------------------------------------------------------------------------------- SuperBill Details Patient Name: [name] Date of Service: 04/02/2015 Medical Record Patient Account Number: [id number] Number: Treating RN: Phillis Haggis 06-30-1945 (69 y.o. 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, left I70.322 leg Facility Procedures CPT4: Description Modifier Quantity Code [id number] 11042 - DEB SUBQ TISSUE 20 SQ CM/< 1 ICD-10 Description Diagnosis 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

## 2015-04-05 DIAGNOSIS — E119 Type 2 diabetes mellitus without complications: Secondary | ICD-10-CM | POA: Diagnosis not present

## 2015-04-05 DIAGNOSIS — L97221 Non-pressure chronic ulcer of left calf limited to breakdown of skin: Secondary | ICD-10-CM | POA: Diagnosis not present

## 2015-04-05 DIAGNOSIS — I70342 Atherosclerosis of unspecified type of bypass graft(s) of the left leg with ulceration of calf: Secondary | ICD-10-CM | POA: Diagnosis not present

## 2015-04-05 DIAGNOSIS — I70242 Atherosclerosis of native arteries of left leg with ulceration of calf: Secondary | ICD-10-CM | POA: Diagnosis not present

## 2015-04-05 DIAGNOSIS — L97229 Non-pressure chronic ulcer of left calf with unspecified severity: Secondary | ICD-10-CM | POA: Diagnosis not present

## 2015-04-05 DIAGNOSIS — E1151 Type 2 diabetes mellitus with diabetic peripheral angiopathy without gangrene: Secondary | ICD-10-CM | POA: Diagnosis not present

## 2015-04-09 ENCOUNTER — Encounter: Payer: Medicare Other | Admitting: Internal Medicine

## 2015-04-09 DIAGNOSIS — I10 Essential (primary) hypertension: Secondary | ICD-10-CM | POA: Diagnosis not present

## 2015-04-09 DIAGNOSIS — M1990 Unspecified osteoarthritis, unspecified site: Secondary | ICD-10-CM | POA: Diagnosis not present

## 2015-04-09 DIAGNOSIS — L97821 Non-pressure chronic ulcer of other part of left lower leg limited to breakdown of skin: Secondary | ICD-10-CM | POA: Diagnosis not present

## 2015-04-09 DIAGNOSIS — I70322 Atherosclerosis of unspecified type of bypass graft(s) of the extremities with rest pain, left leg: Secondary | ICD-10-CM | POA: Diagnosis not present

## 2015-04-09 DIAGNOSIS — L97221 Non-pressure chronic ulcer of left calf limited to breakdown of skin: Secondary | ICD-10-CM | POA: Diagnosis not present

## 2015-04-09 DIAGNOSIS — E1140 Type 2 diabetes mellitus with diabetic neuropathy, unspecified: Secondary | ICD-10-CM | POA: Diagnosis not present

## 2015-04-09 DIAGNOSIS — I70342 Atherosclerosis of unspecified type of bypass graft(s) of the left leg with ulceration of calf: Secondary | ICD-10-CM | POA: Diagnosis not present

## 2015-04-09 DIAGNOSIS — I739 Peripheral vascular disease, unspecified: Secondary | ICD-10-CM | POA: Diagnosis not present

## 2015-04-09 DIAGNOSIS — E11622 Type 2 diabetes mellitus with other skin ulcer: Secondary | ICD-10-CM | POA: Diagnosis not present

## 2015-04-10 NOTE — Progress Notes (Signed)
[name], [name] ([id number]) Visit Report for 04/09/2015 Chief Complaint Document Details Patient Name: [name], [name] Date of Service: 04/09/2015 1:30 PM Medical Record Patient Account Number: [id number] Number: Treating RN: Clover Mealy, RN, BSN, Rita 1945/06/18 [phone] y.o. Other Clinician: Date of Birth/Sex: Female) Treating ROBSON, MICHAEL Primary Care Physician/Extender: Kerry Kass, [facility] Physician: Referring Physician: Sherrie Mustache Weeks in Treatment: 6 Information Obtained from: Patient Chief Complaint Left calf ulcerations. Arterial insufficiency. Electronic Signature(s) Signed: 04/10/2015 8:05:15 AM By: Baltazar Najjar MD Entered By: Baltazar Najjar on 04/09/2015 14:08:58 [name], [name] ([id number]) -------------------------------------------------------------------------------- Debridement Details Patient Name: [name] Date of Service: 04/09/2015 1:30 PM Medical Record Patient Account Number: [id number] Number: Treating RN: Clover Mealy RN, BSN, Rita 1945/03/28 [phone] y.o. Other Clinician: Date of Birth/Sex: Female) Treating ROBSON, MICHAEL Primary Care Physician/Extender: Kerry Kass, [facility] Physician: Referring Physician: Sherrie Mustache Weeks in Treatment: 6 Debridement Performed for Wound #2 Left,Medial Lower Leg Assessment: Performed By: Physician Maxwell Caul, MD Debridement: Debridement Pre-procedure Yes Verification/Time Out Taken: Start Time: 14:00 Pain Control: Lidocaine 4% Topical Solution Level: Skin/Subcutaneous Tissue Total Area Debrided (L x 1.8 (cm) x 2 (cm) = 3.6 (cm) W): Tissue and other Non-Viable, Fibrin/Slough, Subcutaneous material debrided: Instrument: Curette Bleeding: Minimum Hemostasis Achieved: Pressure End Time: 14:04 Procedural Pain: 0 Post Procedural Pain: 0 Response to Treatment: Procedure was tolerated well Post Debridement Measurements of Total Wound Length: (cm) 1.8 Width: (cm) 2 Depth: (cm) 0.2 Volume:  (cm) 0.565 Post Procedure Diagnosis Same as Pre-procedure Electronic Signature(s) Signed: 04/09/2015 5:12:37 PM By: Elpidio Eric BSN, RN Signed: 04/10/2015 8:05:15 AM By: Baltazar Najjar MD Entered By: Baltazar Najjar on 04/09/2015 14:08:46 [place], [name] ([id number]) [name], [name] ([id number]) -------------------------------------------------------------------------------- HPI Details Patient Name: [name] Date of Service: 04/09/2015 1:30 PM Medical Record Patient Account Number: [id number] Number: Treating RN: Clover Mealy, RN, BSN, Rita 11/26/1945 [phone] y.o. Other Clinician: Date of Birth/Sex: Female) Treating ROBSON, MICHAEL Primary Care Physician/Extender: Kerry Kass, [facility] Physician: Referring Physician: Sherrie Mustache Weeks in Treatment: 6 History of Present Illness HPI Description: Pleasant 70 year old with diabetes (no hemoglobin A1c available) and severe peripheral vascular disease. She reports a prior left lower extremity stent. Records unavailable. Underwent multiple level angioplasty of left lower extremity 10/29/2014 by Dr. Wyn Quaker. Developed left calf ulcerations in late December 2016. Denies any trauma. Seen by her PCP. Bactrim prescribed. Referred to the wound clinic. Biopsy culture 02/20/2015 grew methicillin sensitive staph aureus, sensitive to Bactrim. Performing dressing changes with Prisma. Using a Tubigrip for edema control. Scheduled to see Dr. Wyn Quaker in follow-up 02/25/2015. Cancelled because of weather. Rescheduled for 03/06/2015. She returns to clinic for follow-up and is without new complaints. Pain improved. No ischemic rest pain. No fever or chills. Minimal drainage. 03/13/2015 -- records obtained from [place] [facility] shows that she was seen by Dr. dew on 03/12/2015 and given tramadol and other symptomatic treatment. Recent ABI done showed right to be 0.94 and left to be 0.47 a left lower extremity arterial duplex was notable for an occluded  stent and distal ATA. Recommendations were for left lower extremity angiogram with intervention. on 03/11/2015 she was taken up for a aortogram with angioplasty of the left posterior tibial artery and tibioperoneal trunk, above-knee popliteal artery and almost entire SFA, stent placement to the SFA and popliteal artery. 03/20/15 her arterial interventions noted. The patient states her left leg feels better. He ischemic wound on her left anterior leg. She has been using Santyl 04/09/15; the patient arrives back today.  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 dew. I was concerned about her last week as she had weeping edema fluid and areas of threatened ulceration around her wound. I ordered a silver alginate, Profore light wrap. She comes back today with an Radio broadcast assistant on apparently applied by Dr. Driscilla Grammes office. Her edema is under much better control and the weeping area here last week surrounding her major wound has resolved. She does have another open area which is small and superficial. I'm not sure I would put an Unna boot on this lady's degree of arterial insufficiency. Electronic Signature(s) Signed: 04/10/2015 8:05:15 AM By: Baltazar Najjar MD Entered By: Baltazar Najjar on 04/09/2015 14:11:00 Obryan, [name] ([id number]) -------------------------------------------------------------------------------- Physical Exam Details Patient Name: [name] Date of Service: 04/09/2015 1:30 PM Medical Record Patient Account Number: [id number] Number: Treating RN: Clover Mealy, RN, BSN, Rita 04-14-1945 [phone] y.o. Other Clinician: Date of Birth/Sex: Female) Treating ROBSON, MICHAEL Primary Care Physician/Extender: Kerry Kass, [facility] Physician: Referring Physician: Sherrie Mustache Weeks in Treatment: 6 Cardiovascular Dorsalis pedis pulses present but reduced.. Edema much better controlled. Notes Wound exam; weeping edema fluid from last week is  resolved. Her original wound does not look too different. Requires a surgical debridement again to remove surface slough and nonviable subcutaneous tissue. She has one new open area just below her major wound, given the status of this area last week this is probably not a bad outcome. There is no evidence of infection Electronic Signature(s) Signed: 04/10/2015 8:05:15 AM By: Baltazar Najjar MD Entered By: Baltazar Najjar on 04/09/2015 14:13:15 [name], [name] ([id number]) -------------------------------------------------------------------------------- Physician Orders Details Patient Name: [name] Date of Service: 04/09/2015 1:30 PM Medical Record Patient Account Number: [id number] Number: Treating RN: Clover Mealy RN, BSN, Rita 09-Sep-1945 [phone] y.o. Other Clinician: Date of Birth/Sex: Female) Treating ROBSON, MICHAEL Primary Care Physician/Extender: Kerry Kass, [facility] Physician: Referring Physician: Augustin Coupe in Treatment: 6 Verbal / Phone Orders: Yes Clinician: Afful, RN, BSN, Rita Read Back and Verified: Yes Diagnosis Coding Wound Cleansing Wound #2 Left,Medial Lower Leg o Cleanse wound with mild soap and water o May shower with protection. Anesthetic Wound #2 Left,Medial Lower Leg o Topical Lidocaine 4% cream applied to wound bed prior to debridement Skin Barriers/Peri-Wound Care Wound #2 Left,Medial Lower Leg o Barrier cream Primary Wound Dressing Wound #2 Left,Medial Lower Leg o Prisma Ag Secondary Dressing Wound #2 Left,Medial Lower Leg o ABD pad Dressing Change Frequency Wound #2 Left,Medial Lower Leg o Other: - Change Tuesday and Friday Follow-up Appointments Wound #2 Left,Medial Lower Leg o Return Appointment in 1 week. Edema Control Wound #2 Left,Medial Lower Leg o 3 Layer Compression System - Left Lower Extremity - use unna wrap to anchor Abair, Syeda N. ([id number]) o Elevate legs to the level of the heart and pump  ankles as often as possible Home Health Wound #2 Left,Medial Lower Leg o Continue Home Health Visits o Home Health Nurse may visit PRN to address patientos wound care needs. o FACE TO FACE ENCOUNTER: MEDICARE and MEDICAID PATIENTS: I certify that this patient is under my care and that I had a face-to-face encounter that meets the physician face-to-face encounter requirements with this patient on this date. The encounter with the patient was in whole or in part for the following MEDICAL CONDITION: (primary reason for Home Healthcare) MEDICAL NECESSITY: I certify, that based on my findings, NURSING services are a medically necessary home health service. HOME BOUND STATUS: I certify  that my clinical findings support that this patient is homebound (i.e., Due to illness or injury, pt requires aid of supportive devices such as crutches, cane, wheelchairs, walkers, the use of special transportation or the assistance of another person to leave their place of residence. There is a normal inability to leave the home and doing so requires considerable and taxing effort. Other absences are for medical reasons / religious services and are infrequent or of short duration when for other reasons). o If current dressing causes regression in wound condition, may D/C ordered dressing product/s and apply Normal Saline Moist Dressing daily until next Wound Healing Center / Other MD appointment. Notify Wound Healing Center of regression in wound condition at [phone]. o Please direct any NON-WOUND related issues/requests for orders to patient's Primary Care Physician Electronic Signature(s) Signed: 04/09/2015 5:12:37 PM By: Elpidio Eric BSN, RN Signed: 04/10/2015 8:05:15 AM By: Baltazar Najjar MD Entered By: Elpidio Eric on 04/09/2015 14:05:02 [name], [name] ([id number]) -------------------------------------------------------------------------------- Problem List Details Patient Name: [name] Date of Service: 04/09/2015 1:30 PM Medical Record Patient Account Number: [id number] Number: Treating RN: Clover Mealy RN, BSN, Rita [birth year] [phone] y.o. 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 Other Clinician: Date of Birth/Sex: Female) Treating ROBSON, MICHAEL Primary Care Physician/Extender: Kerry Kass, [facility] Physician: Referring Physician: Sherrie Mustache Weeks in Treatment: 6 Subjective Chief Complaint Information obtained from Patient Left calf ulcerations. Arterial insufficiency. History of Present Illness (HPI) Pleasant 70 year old with diabetes (no hemoglobin A1c available) and severe peripheral vascular disease. She reports a prior left lower extremity stent. Records unavailable. Underwent multiple level angioplasty of left lower extremity 10/29/2014 by Dr. Wyn Quaker. Developed left calf ulcerations in late December  2016. Denies any trauma. Seen by her PCP. Bactrim prescribed. Referred to the wound clinic. Biopsy culture 02/20/2015 grew methicillin sensitive staph aureus, sensitive to Bactrim. Performing dressing changes with Prisma. Using a Tubigrip for edema control. Scheduled to see Dr. Wyn Quaker in follow-up 02/25/2015. Cancelled because of weather. Rescheduled for 03/06/2015. She returns to clinic for follow-up and is without new complaints. Pain improved. No ischemic rest pain. No fever or chills. Minimal drainage. 03/13/2015 -- records obtained from [place] [facility] shows that she was seen by Dr. dew on 03/12/2015 and given tramadol and other symptomatic treatment. Recent ABI done showed right to be 0.94 and left to be 0.47 a left lower extremity arterial duplex was notable for an occluded stent and distal ATA. Recommendations were for left lower extremity angiogram with intervention. on 03/11/2015 she was taken up for a aortogram with angioplasty of the left posterior tibial artery and tibioperoneal trunk, above-knee popliteal artery and almost entire SFA, stent placement to the SFA and popliteal  artery. 03/20/15 her arterial interventions noted. The patient states her left leg feels better. He ischemic wound on her left anterior leg. She has been using Santyl 04/09/15; the patient arrives back today. 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 dew. I was concerned about her last week as she had weeping edema fluid and areas of threatened ulceration around her wound. I ordered a silver alginate, Profore light wrap. She comes back today with an Radio broadcast assistant on apparently applied by Dr. Driscilla Grammes office. Her edema is under much better control and the weeping area here last week surrounding her major wound has resolved. She does have another open area which is small and superficial. I'm not sure I would put an Unna boot on this lady's degree of arterial  insufficiency. [name], [name] ([id number]) Objective Constitutional Vitals Time Taken: 1:46 PM, Height: 67 in, Weight: 302 lbs, BMI: 47.3, Temperature: 97.9 F, Pulse: 73 bpm, Respiratory Rate: 18 breaths/min, Blood Pressure: 134/102 mmHg. Cardiovascular Dorsalis pedis pulses present but reduced.. Edema much better controlled. General Notes: Wound exam; weeping edema fluid from last week is resolved. Her original wound does not look too different. Requires a surgical debridement again to remove surface slough and nonviable subcutaneous tissue. She has one new open area just below her major wound, given the status of this area last week this is probably not a bad outcome. There is no evidence of infection Integumentary (Hair, Skin) Wound #2 status is Open. Original cause of wound was Blister. The wound is located on the Left,Medial Lower Leg. The wound measures 1.8cm length x 2cm width x 0.2cm depth; 2.827cm^2 area and 0.565cm^3 volume. The wound is limited to skin breakdown. There is no tunneling or undermining noted. There is a medium amount of serosanguineous drainage noted. The wound margin is flat and intact. There is medium (34-66%) pink, pale granulation within the wound bed. There is a small (1-33%) amount of necrotic tissue within the wound bed including Adherent Slough. The periwound skin appearance exhibited: Localized Edema, Moist. The periwound skin appearance did not exhibit: Callus, Crepitus, Excoriation, Fluctuance, Friable, Induration, Rash, Scarring, Dry/Scaly, Maceration, Atrophie Blanche, Cyanosis, Ecchymosis, Hemosiderin Staining, Mottled, Pallor, Rubor, Erythema. The periwound has tenderness on palpation. Wound #3 status is Open. Original cause of wound was Gradually Appeared. The wound is located on the Left,Distal Lower Leg. The wound measures 1.2cm length x 1.5cm width x 0.1cm depth; 1.414cm^2 area and 0.141cm^3 volume. The wound is limited to skin breakdown. There is  no tunneling or undermining noted. There is a small amount of serous drainage noted. The wound margin is distinct with the outline attached to the wound base. There is no granulation within the wound bed. There is a small (1-33%) amount of necrotic tissue within the wound bed including Adherent Slough. The periwound skin appearance exhibited: Moist. The periwound skin appearance did not exhibit: Callus, Crepitus, Excoriation, Fluctuance, Friable, Induration, Localized Edema, Rash, Scarring, Dry/Scaly, Maceration, Atrophie Blanche, Cyanosis, Ecchymosis, Hemosiderin Staining, Mottled, Pallor, Rubor, Erythema. Periwound temperature was noted as No Abnormality. 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  There was a Skin/Subcutaneous Tissue Debridement ([id number]) debridement with total area of 3.6 sq cm performed by Maxwell Caul, MD. with the following instrument(s): Curette to remove Non-Viable tissue/material including Fibrin/Slough and Subcutaneous after achieving pain control using Lidocaine 4% Topical Solution. A time out was conducted prior to the start of the procedure. A Minimum amount of bleeding was controlled with Pressure. The procedure was tolerated well with a pain level of 0 throughout and a pain level of 0 following the procedure. Post Debridement Measurements: 1.8cm length x 2cm width x 0.2cm depth; 0.565cm^3 volume. Post procedure Diagnosis Wound #2: Same as Pre-Procedure Plan Wound Cleansing: Wound #2 Left,Medial Lower Leg: Cleanse  wound with mild soap and water May shower with protection. Anesthetic: Wound #2 Left,Medial Lower Leg: Topical Lidocaine 4% cream applied to wound bed prior to debridement Skin Barriers/Peri-Wound Care: Wound #2 Left,Medial Lower Leg: Barrier cream Primary Wound Dressing: Wound #2 Left,Medial Lower Leg: Prisma Ag Secondary Dressing: Wound #2 Left,Medial Lower Leg: ABD pad Dressing Change Frequency: Wound #2 Left,Medial Lower Leg: Other: - Change Tuesday and Friday Follow-up Appointments: [name], [name] ([id number]) Wound #2 Left,Medial Lower Leg: Return Appointment in 1 week. Edema Control: Wound #2 Left,Medial Lower Leg: 3 Layer Compression System - Left Lower Extremity - use unna wrap to anchor Elevate legs to the level of the heart and pump ankles as often as possible Home Health: Wound #2 Left,Medial Lower Leg: Continue Home Health Visits Home Health Nurse may visit PRN to address patient s wound care needs. FACE TO FACE ENCOUNTER: MEDICARE and MEDICAID PATIENTS: I certify that this patient is under my care and that I had a face-to-face encounter that meets the physician face-to-face encounter requirements with this patient on this date. The encounter with the patient was in whole or in part for the following MEDICAL CONDITION: (primary reason for Home Healthcare) MEDICAL NECESSITY: I certify, that based on my findings, NURSING services are a medically necessary home health service. HOME BOUND STATUS: I certify that my clinical findings support that this patient is homebound (i.e., Due to illness or injury, pt requires aid of supportive devices such as crutches, cane, wheelchairs, walkers, the use of special transportation or the assistance of another person to leave their place of residence. There is a normal inability to leave the home and doing so requires considerable and taxing effort. Other absences are for medical reasons / religious services and are infrequent or of  short duration when for other reasons). If current dressing causes regression in wound condition, may D/C ordered dressing product/s and apply Normal Saline Moist Dressing daily until next Wound Healing Center / Other MD appointment. Notify Wound Healing Center of regression in wound condition at [phone]. Please direct any NON-WOUND related issues/requests for orders to patient's Primary Care Physician #1 Prisma to both wound areas with an ABG pad. #2 Profore light wraps #3 consider Iodoflex next week #4 patient likely has combined arterial and venous insufficiency. She saw Dr. dew pronounced himself satisfied with her arterial insufficiency and that was further exemplified by putting a Unna boot on her leg. I am hopeful we can get by with a Profore light Electronic Signature(s) Signed: 04/10/2015 8:05:15 AM By: Baltazar Najjar MD Entered By: Baltazar Najjar on 04/09/2015 14:15:09 Earhart, [name] ([id number]) -------------------------------------------------------------------------------- SuperBill Details Patient Name: [name] Date of Service: 04/09/2015 Medical Record Patient Account Number: [id number] Number: Treating RN: Clover Mealy RN, BSN, Rita Dec 08, 1945 [phone] y.o. 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

## 2015-04-10 NOTE — Progress Notes (Signed)
FARRELL, BROERMAN ([id number]) Visit Report for 04/09/2015 Arrival Information Details Patient Name: [name], [name] Date of Service: 04/09/2015 1:30 PM Medical Record Patient Account Number: [id number] Number: Treating RN: Clover Mealy, RN, BSN, Rita 1945/07/08 [phone] y.o. Other Clinician: Date of Birth/Sex: Female) Treating ROBSON, MICHAEL Primary Care Physician: Sherrie Mustache Physician/Extender: G Referring Physician: Sherrie Mustache Weeks in Treatment: 6 Visit Information History Since Last Visit Added or deleted any medications: No Patient Arrived: Wheel Chair Any new allergies or adverse reactions: No Arrival Time: 13:44 Had a fall or experienced change in No Accompanied By: caregiver activities of daily living that may affect Transfer Assistance: None risk of falls: Patient Identification Verified: Yes Signs or symptoms of abuse/neglect since last No Secondary Verification Process Yes visito Completed: Has Dressing in Place as Prescribed: Yes Patient Requires Transmission- No Pain Present Now: No Based Precautions: Patient Has Alerts: Yes Patient Alerts: Patient on Blood Thinner plavix, aspirin  DMII ABI right 0.94 ABI left 1.16 Electronic Signature(s) Signed: 04/09/2015 5:12:37 PM By: Elpidio Eric BSN, RN Entered By: Elpidio Eric on 04/09/2015 13:44:47 Perrelli, [name] ([id number]) -------------------------------------------------------------------------------- Encounter Discharge Information Details Patient Name: [name] Date of Service: 04/09/2015 1:30 PM Medical Record Patient Account Number: [id number] Number: Treating RN: Clover Mealy, RN, BSN, Rita [birth year] [phone] y.o. Other Clinician: Date of Birth/Sex: Female) Treating ROBSON, MICHAEL Primary Care Physician: Sherrie Mustache Physician/Extender: G Referring Physician: Sherrie Mustache Weeks in Treatment: 6 Encounter Discharge Information Items Discharge Pain Level: 0 Discharge Condition:  Stable Ambulatory Status: Wheelchair Discharge Destination: Home Transportation: Private Auto Accompanied By: caregiver Schedule Follow-up Appointment: No Medication Reconciliation completed and provided to Patient/Care No Inita Uram: Provided on Clinical Summary of Care: 04/09/2015 Form Type Recipient Paper Patient MW Electronic Signature(s) Signed: 04/09/2015 2:16:39 PM By: Francie Massing Entered By: Francie Massing on 04/09/2015 14:16:39 Bieser, [name] ([id number]) -------------------------------------------------------------------------------- Lower Extremity Assessment Details Patient Name: [name] Date of Service: 04/09/2015 1:30 PM Medical Record Patient Account Number: [id number] Number: Treating RN: Clover Mealy, RN, BSN, Rita 1945-08-08 [phone] y.o. Other Clinician: Date of Birth/Sex: Female) Treating ROBSON, MICHAEL Primary Care Physician: Sherrie Mustache Physician/Extender: G Referring Physician: Sherrie Mustache Weeks in Treatment: 6 Vascular Assessment Pulses: Posterior Tibial Dorsalis Pedis Palpable: [Left:Yes] Extremity colors, hair growth, and conditions: Extremity Color: [Left:Mottled] Hair Growth on Extremity: [Left:No] Temperature of Extremity: [Left:Warm] Capillary Refill: [Left:< 3 seconds] Toe Nail Assessment Left: Right: Thick: Yes Discolored: Yes Improper Length and Hygiene: No Electronic Signature(s) Signed: 04/09/2015 5:12:37 PM By: Elpidio Eric BSN, RN Entered By: Elpidio Eric on 04/09/2015 13:46:14 Reppond, [name] ([id number]) -------------------------------------------------------------------------------- Multi Wound Chart Details Patient Name: [name] Date of Service: 04/09/2015 1:30 PM Medical Record Patient Account Number: [id number] Number: Treating RN: Clover Mealy, RN, BSN, Rita 05-28-1945 [phone] y.o. 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] [N/A:N/A] Comorbid History: [2:Hypertension, Peripheral Hypertension, Peripheral Arterial Disease, Type II Arterial Disease, Type II Diabetes, Osteoarthritis, Diabetes, Osteoarthritis, Neuropathy] [3:Neuropathy] [N/A:N/A] Date Acquired: [2:01/28/2015] [3:03/25/2015] [N/A:N/A] Weeks of Treatment: [2:6] [3:0] [N/A:N/A] Wound Status: [2:Open] [3:Open] [N/A:N/A] Measurements L x W  x D 1.8x2x0.2 [3:1.2x1.5x0.1] [N/A:N/A] (cm) Area (cm) : [2:2.827] [3:1.414] [N/A:N/A] Volume (cm) : [2:0.565] [3:0.141] [N/A:N/A] % Reduction in Area: [2:-400.40%] [3:0.00%] [N/A:N/A] % Reduction in Volume: -400.00% [3:0.00%] [N/A:N/A] Classification: [2:Full Thickness Without Exposed Support Structures] [3:Grade 1] [N/A:N/A] HBO Classification: [2:Grade 1] [3:N/A] [N/A:N/A] Exudate Amount: [2:Medium] [3:Small] [N/A:N/A] Exudate Type: [2:Serosanguineous] [3:Serous] [N/A:N/A] Exudate Color: [2:red, brown] [3:amber] [N/A:N/A] Wound Margin: [2:Flat and Intact] [3:Distinct, outline attached] [N/A:N/A] Granulation Amount: [2:Medium (34-66%)] [3:None Present (0%)] [N/A:N/A] Granulation Quality: [2:Pink, Pale] [3:N/A] [N/A:N/A] Necrotic Amount: Small (1-33%) Small (1-33%) N/A Exposed Structures: Fascia: No Fascia: No N/A Fat: No Fat: No Tendon: No Tendon: No Muscle: No Muscle: No Joint: No Joint: No Bone: No Bone: No Limited to Skin Limited to Skin Breakdown Breakdown Epithelialization:  None None N/A Periwound Skin Texture: Edema: Yes Edema: No N/A Excoriation: No Excoriation: No Induration: No Induration: No Callus: No Callus: No Crepitus: No Crepitus: No Fluctuance: No Fluctuance: No Friable: No Friable: No Rash: No Rash: No Scarring: No Scarring: No Periwound Skin Moist: Yes Moist: Yes N/A Moisture: Maceration: No Maceration: No Dry/Scaly: No Dry/Scaly: No Periwound Skin Color: Atrophie Blanche: No Atrophie Blanche: No N/A Cyanosis: No Cyanosis: No Ecchymosis: No Ecchymosis: No Erythema: No Erythema: No Hemosiderin Staining: No Hemosiderin Staining: No Mottled: No Mottled: No Pallor: No Pallor: No Rubor: No Rubor: No Temperature: N/A No Abnormality N/A Tenderness on Yes No N/A Palpation: Wound Preparation: Ulcer Cleansing: Ulcer Cleansing: N/A Rinsed/Irrigated with Rinsed/Irrigated with Saline Saline Topical Anesthetic Topical Anesthetic Applied: Other: lidocaine Applied: Other: lidocaine 4% 4% Treatment Notes Electronic Signature(s) Signed: 04/09/2015 5:12:37 PM By: Elpidio Eric BSN, RN Entered By: Elpidio Eric on 04/09/2015 14:01:59 [name], [name] ([id number]) -------------------------------------------------------------------------------- Multi-Disciplinary Care Plan Details Patient Name: [name] Date of Service: 04/09/2015 1:30 PM Medical Record Patient Account Number: [id number] Number: Treating RN: Clover Mealy, RN, BSN, Rita 1945/04/21 [phone] y.o. Other Clinician: Date of Birth/Sex: Female) Treating ROBSON, MICHAEL Primary Care Physician: Sherrie Mustache Physician/Extender: G Referring Physician: Sherrie Mustache Weeks in Treatment: 6 Active Inactive Abuse / Safety / Falls / Self Care Management Nursing Diagnoses: Potential for falls Goals: Patient will remain injury free Date Initiated: 02/20/2015 Goal Status: Active Interventions: Assess fall risk on admission and as needed Notes: Nutrition Nursing  Diagnoses: Potential for alteratiion in Nutrition/Potential for imbalanced nutrition Goals: Patient/caregiver will maintain therapeutic glucose control Date Initiated: 02/20/2015 Goal Status: Active Interventions: Assess patient nutrition upon admission and as needed per policy Notes: Orientation to the Wound Care Program Nursing Diagnoses: Knowledge deficit related to the wound healing center program Goals: [name], [name] ([id number]) Patient/caregiver will verbalize understanding of the Wound Healing Center Program Date Initiated: 02/20/2015 Goal Status: Active Interventions: Provide education on orientation to the wound center Notes: Wound/Skin Impairment Nursing Diagnoses: Impaired tissue integrity Goals: Ulcer/skin breakdown will heal within 14 weeks Date Initiated: 02/20/2015 Goal Status: Active Interventions: Assess ulceration(s) every visit Notes: Electronic Signature(s) Signed: 04/09/2015 5:12:37 PM By: Elpidio Eric BSN, RN Entered By: Elpidio Eric on 04/09/2015 14:01:43 Rovito, [name] ([id number]) -------------------------------------------------------------------------------- Pain Assessment Details Patient Name: [name] Date of Service: 04/09/2015 1:30 PM Medical Record Patient Account Number: [id number] Number: Treating RN: Clover Mealy, RN, BSN, Rita [birth year] [phone] y.o. Other Clinician: Date of Birth/Sex: Female) Treating ROBSON, MICHAEL Primary Care Physician: Sherrie Mustache Physician/Extender: G Referring Physician: Sherrie Mustache Weeks in Treatment: 6 Active Problems Location of Pain Severity and Description of Pain Patient Has Paino No Site Locations Pain Management and Medication Current Pain Management: Electronic Signature(s)  Signed: 04/09/2015 5:12:37 PM By: Elpidio Eric BSN, RN Entered By: Elpidio Eric on 04/09/2015 13:44:54 Stumpp, [name]  ([id number]) -------------------------------------------------------------------------------- Patient/Caregiver Education Details Patient Name: [name] Date of Service: 04/09/2015 1:30 PM Medical Record Patient Account Number: [id number] Number: Treating RN: Clover Mealy, RN, BSN, Rita Mar 23, 1945 [phone] y.o. Other Clinician: Date of Birth/Gender: Female) Treating ROBSON, MICHAEL Primary Care Physician: Sherrie Mustache Physician/Extender: G Referring Physician: Augustin Coupe in Treatment: 6 Education Assessment Education Provided To: Patient Education Topics Provided Welcome To The Wound Care Center: Methods: Explain/Verbal Responses: State content correctly Electronic Signature(s) Signed: 04/09/2015 5:12:37 PM By: Elpidio Eric BSN, RN Entered By: Elpidio Eric on 04/09/2015 14:15:41 Jeng, [name] ([id number]) -------------------------------------------------------------------------------- Wound Assessment Details Patient Name: [name] Date of Service: 04/09/2015 1:30 PM Medical Record Patient Account Number: [id number] Number: Treating RN: Clover Mealy RN, BSN, Rita 1945/06/15 [phone] y.o. Other Clinician: Date of Birth/Sex: Female) Treating ROBSON, MICHAEL Primary Care Physician: Sherrie Mustache Physician/Extender: G Referring Physician: Sherrie Mustache Weeks in Treatment: 6 Wound Status Wound Number: 2 Primary Arterial Insufficiency Ulcer Etiology: Wound Location: Left Lower Leg - Medial Wound Open Wounding Event: Blister Status: Date Acquired: 01/28/2015 Comorbid Hypertension, Peripheral Arterial Weeks Of Treatment: 6 History: Disease, Type II Diabetes, Clustered Wound: No Osteoarthritis, Neuropathy Photos Photo Uploaded By: Elpidio Eric on 04/09/2015 17:19:13 Wound Measurements Length: (cm) 1.8 Width: (cm) 2 Depth: (cm) 0.2 Area: (cm) 2.827 Volume: (cm) 0.565 % Reduction in Area: -400.4% % Reduction in Volume: -400% Epithelialization:  None Tunneling: No Undermining: No Wound Description Full Thickness Without Exposed Classification: Support Structures [name], [name] ([id number]) Foul Odor After Cleansing: No Diabetic Severity Grade 1 (Wagner): Wound Margin: Flat and Intact Exudate Amount: Medium Exudate Type: Serosanguineous Exudate Color: red, brown Wound Bed Granulation Amount: Medium (34-66%) Exposed Structure Granulation Quality: Pink, Pale Fascia Exposed: No Necrotic Amount: Small (1-33%) Fat Layer Exposed: No Necrotic Quality: Adherent Slough Tendon Exposed: No Muscle Exposed: No Joint Exposed: No Bone Exposed: No Limited to Skin Breakdown Periwound Skin Texture Texture Color No Abnormalities Noted: No No Abnormalities Noted: No Callus: No Atrophie Blanche: No Crepitus: No Cyanosis: No Excoriation: No Ecchymosis: No Fluctuance: No Erythema: No Friable: No Hemosiderin Staining: No Induration: No Mottled: No Localized Edema: Yes Pallor: No Rash: No Rubor: No Scarring: No Temperature / Pain Moisture Tenderness on Palpation: Yes No Abnormalities Noted: No Dry / Scaly: No Maceration: No Moist: Yes Wound Preparation Ulcer Cleansing: Rinsed/Irrigated with Saline Topical Anesthetic Applied: Other: lidocaine 4%, Treatment Notes Wound #2 (Left, Medial Lower Leg) 1. Cleansed with: Clean wound with Normal Saline 2. Anesthetic Topical Lidocaine 4% cream to wound bed prior to debridement 4. Dressing Applied: SERRA, YOUNAN ([id number]) Prisma Ag 5. Secondary Dressing Applied ABD Pad 7. Secured with Tape 3 Layer Compression System - Left Lower Extremity Electronic Signature(s) Signed: 04/09/2015 5:12:37 PM By: Elpidio Eric BSN, RN Entered By: Elpidio Eric on 04/09/2015 13:54:52 Ruybal, [name] ([id number]) -------------------------------------------------------------------------------- Wound Assessment Details Patient Name: [name] Date of Service: 04/09/2015 1:30 PM Medical  Record Patient Account Number: [id number] Number: Treating RN: Clover Mealy, RN, BSN, Rita 1945/07/23 [phone] y.o. Other Clinician: Date of Birth/Sex: Female) Treating ROBSON, MICHAEL Primary Care Physician: Sherrie Mustache Physician/Extender: G Referring Physician: Sherrie Mustache Weeks in Treatment: 6 Wound Status Wound Number: 3 Primary Diabetic Wound/Ulcer of the Lower Etiology: Extremity Wound Location: Left Lower Leg - Distal Wound Open Wounding Event: Gradually Appeared Status: Date Acquired: 03/25/2015 Comorbid Hypertension, Peripheral Arterial Weeks Of Treatment: 0 History:  Disease, Type II Diabetes, Clustered Wound: No Osteoarthritis, Neuropathy Photos Photo Uploaded By: Elpidio Eric on 04/09/2015 17:19:44 Wound Measurements Length: (cm) 1.2 Width: (cm) 1.5 Depth: (cm) 0.1 Area: (cm) 1.414 Volume: (cm) 0.141 % Reduction in Area: 0% % Reduction in Volume: 0% Epithelialization: None Tunneling: No Undermining: No Wound Description Classification: Grade 1 Foul Odor Aft Wound Margin: Distinct, outline attached [name], [name] ([id number]) er Cleansing: No Exudate Amount: Small Exudate Type: Serous Exudate Color: amber Wound Bed Granulation Amount: None Present (0%) Exposed Structure Necrotic Amount: Small (1-33%) Fascia Exposed: No Necrotic Quality: Adherent Slough Fat Layer Exposed: No Tendon Exposed: No Muscle Exposed: No Joint Exposed: No Bone Exposed: No Limited to Skin Breakdown Periwound Skin Texture Texture Color No Abnormalities Noted: No No Abnormalities Noted: No Callus: No Atrophie Blanche: No Crepitus: No Cyanosis: No Excoriation: No Ecchymosis: No Fluctuance: No Erythema: No Friable: No Hemosiderin Staining: No Induration: No Mottled: No Localized Edema: No Pallor: No Rash: No Rubor: No Scarring: No Temperature / Pain Moisture Temperature: No Abnormality No Abnormalities Noted: No Dry / Scaly: No Maceration: No Moist:  Yes Wound Preparation Ulcer Cleansing: Rinsed/Irrigated with Saline Topical Anesthetic Applied: Other: lidocaine 4%, Treatment Notes Wound #3 (Left, Distal Lower Leg) 1. Cleansed with: Clean wound with Normal Saline 2. Anesthetic Topical Lidocaine 4% cream to wound bed prior to debridement 4. Dressing Applied: Prisma Ag 5. Secondary Dressing Applied ABD Pad [name], [name] ([id number]) 7. Secured with Tape 3 Layer Compression System - Left Lower Extremity Electronic Signature(s) Signed: 04/09/2015 5:12:37 PM By: Elpidio Eric BSN, RN Entered By: Elpidio Eric on 04/09/2015 13:56:30 Burgen, [name] ([id number]) -------------------------------------------------------------------------------- Vitals Details Patient Name: [name] Date of Service: 04/09/2015 1:30 PM Medical Record Patient Account Number: [id number] Number: Treating RN: Clover Mealy RN, BSN, Rita [birth year] [phone] y.o. Other Clinician: Date of Birth/Sex: Female) Treating ROBSON, MICHAEL Primary Care Physician: Sherrie Mustache Physician/Extender: G Referring Physician: Sherrie Mustache Weeks in Treatment: 6 Vital Signs Time Taken: 13:46 Temperature (F): 97.9 Height (in): 67 Pulse (bpm): 73 Weight (lbs): 302 Respiratory Rate (breaths/min): 18 Body Mass Index (BMI): 47.3 Blood Pressure (mmHg): 134/102 Reference Range: 80 - 120 mg / dl Electronic Signature(s) Signed: 04/09/2015 5:12:37 PM By: Elpidio Eric BSN, RN Entered By: Elpidio Eric on 04/09/2015 13:47:20

## 2015-04-12 DIAGNOSIS — L97229 Non-pressure chronic ulcer of left calf with unspecified severity: Secondary | ICD-10-CM | POA: Diagnosis not present

## 2015-04-12 DIAGNOSIS — I70342 Atherosclerosis of unspecified type of bypass graft(s) of the left leg with ulceration of calf: Secondary | ICD-10-CM | POA: Diagnosis not present

## 2015-04-12 DIAGNOSIS — E1151 Type 2 diabetes mellitus with diabetic peripheral angiopathy without gangrene: Secondary | ICD-10-CM | POA: Diagnosis not present

## 2015-04-12 DIAGNOSIS — L97221 Non-pressure chronic ulcer of left calf limited to breakdown of skin: Secondary | ICD-10-CM | POA: Diagnosis not present

## 2015-04-16 ENCOUNTER — Encounter: Payer: Medicare Other | Admitting: Internal Medicine

## 2015-04-16 DIAGNOSIS — E11622 Type 2 diabetes mellitus with other skin ulcer: Secondary | ICD-10-CM | POA: Diagnosis not present

## 2015-04-16 DIAGNOSIS — L97221 Non-pressure chronic ulcer of left calf limited to breakdown of skin: Secondary | ICD-10-CM | POA: Diagnosis not present

## 2015-04-16 DIAGNOSIS — E1140 Type 2 diabetes mellitus with diabetic neuropathy, unspecified: Secondary | ICD-10-CM | POA: Diagnosis not present

## 2015-04-16 DIAGNOSIS — L97821 Non-pressure chronic ulcer of other part of left lower leg limited to breakdown of skin: Secondary | ICD-10-CM | POA: Diagnosis not present

## 2015-04-16 DIAGNOSIS — I70342 Atherosclerosis of unspecified type of bypass graft(s) of the left leg with ulceration of calf: Secondary | ICD-10-CM | POA: Diagnosis not present

## 2015-04-16 DIAGNOSIS — M1990 Unspecified osteoarthritis, unspecified site: Secondary | ICD-10-CM | POA: Diagnosis not present

## 2015-04-16 DIAGNOSIS — I10 Essential (primary) hypertension: Secondary | ICD-10-CM | POA: Diagnosis not present

## 2015-04-16 DIAGNOSIS — I739 Peripheral vascular disease, unspecified: Secondary | ICD-10-CM | POA: Diagnosis not present

## 2015-04-16 DIAGNOSIS — I70322 Atherosclerosis of unspecified type of bypass graft(s) of the extremities with rest pain, left leg: Secondary | ICD-10-CM | POA: Diagnosis not present

## 2015-04-17 NOTE — Progress Notes (Signed)
[name], [name] ([id number]) Visit Report for 04/16/2015 Arrival Information Details Patient Name: [name], [name] Date of Service: 04/16/2015 1:30 PM Medical Record Patient Account Number: [id number] Number: Treating RN: Clover Mealy, RN, BSN, Rita [birth year] [phone] y.o. Other Clinician: Date of Birth/Sex: Female) Treating ROBSON, MICHAEL Primary Care Physician: Sherrie Mustache Physician/Extender: G Referring Physician: Sherrie Mustache Weeks in Treatment: 7 Visit Information History Since Last Visit Added or deleted any medications: No Patient Arrived: Wheel Chair Any new allergies or adverse reactions: No Arrival Time: 13:30 Had a fall or experienced change in No Accompanied By: nephew activities of daily living that may affect Transfer Assistance: None risk of falls: Patient Identification Verified: Yes Signs or symptoms of abuse/neglect since last No Secondary Verification Process Yes visito Completed: Hospitalized since last visit: No Patient Requires Transmission- No Has Dressing in Place as Prescribed: Yes Based Precautions: Pain Present Now: No Patient Has Alerts: Yes Patient Alerts: Patient on Blood Thinner plavix, aspirin  DMII ABI right 0.94 ABI left 1.16 Electronic Signature(s) Signed: 04/16/2015 4:37:25 PM By: Elpidio Eric BSN, RN Entered By: Elpidio Eric on 04/16/2015 13:30:30 Hugill, [name] ([id number]) -------------------------------------------------------------------------------- Encounter Discharge Information Details Patient Name: [name] Date of Service: 04/16/2015 1:30 PM Medical Record Patient Account Number: [id number] Number: Treating RN: Clover Mealy, RN, BSN, Rita [birth year] [phone] y.o. Other Clinician: Date of Birth/Sex: Female) Treating ROBSON, MICHAEL Primary Care Physician: Sherrie Mustache Physician/Extender: G Referring Physician: Sherrie Mustache Weeks in Treatment: 7 Encounter Discharge Information Items Discharge Pain Level:  0 Discharge Condition: Stable Ambulatory Status: Wheelchair Discharge Destination: Home Transportation: Private Auto Accompanied By: NEPHEW Schedule Follow-up Appointment: No Medication Reconciliation completed and provided to Patient/Care No [name]: Provided on Clinical Summary of Care: 04/16/2015 Form Type Recipient Paper Patient MW Electronic Signature(s) Signed: 04/16/2015 4:37:25 PM By: Elpidio Eric BSN, RN Previous Signature: 04/16/2015 2:41:38 PM Version By: Gwenlyn Perking Entered By: Elpidio Eric on 04/16/2015 14:45:28 Warning, [name] ([id number]) -------------------------------------------------------------------------------- Lower Extremity Assessment Details Patient Name: [name] Date of Service: 04/16/2015 1:30 PM Medical Record Patient Account Number: [id number] Number: Treating RN: Clover Mealy, RN, BSN, Rita 11-22-1945 [phone] y.o. Other Clinician: Date of Birth/Sex: Female) Treating ROBSON, MICHAEL Primary Care Physician: Sherrie Mustache Physician/Extender: G Referring Physician: Sherrie Mustache Weeks in Treatment: 7 Vascular Assessment Pulses: Posterior Tibial Dorsalis Pedis Palpable: [Left:Yes] Extremity colors, hair growth, and conditions: Extremity Color: [Left:Mottled] Hair Growth on Extremity: [Left:Yes] Temperature of Extremity: [Left:Warm] Capillary Refill: [Left:< 3 seconds] Electronic Signature(s) Signed: 04/16/2015 4:37:25 PM By: Elpidio Eric BSN, RN Entered By: Elpidio Eric on 04/16/2015 13:33:53 [name], [name] ([id number]) -------------------------------------------------------------------------------- Multi Wound Chart Details Patient Name: [name] Date of Service: 04/16/2015 1:30 PM Medical Record Patient Account Number: [id number] Number: Treating RN: Clover Mealy, RN, BSN, Rita 10-04-1945 [phone] y.o. 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] [N/A:N/A] Comorbid History: [2:Hypertension, Peripheral N/A Arterial Disease, Type II Diabetes, Osteoarthritis, Neuropathy] [N/A:N/A] Date Acquired: [2:01/28/2015] [3:03/25/2015] [N/A:N/A] Weeks of Treatment: [2:7] [3:1] [N/A:N/A] Wound Status: [2:Open] [3:Open] [N/A:N/A] Measurements L x W x D 1.2x1.7x0.3 [3:1x1x0.1] [N/A:N/A] (cm) Area (  cm) : [2:1.602] [3:0.785] [N/A:N/A] Volume (cm) : [2:0.481] [3:0.079] [N/A:N/A] % Reduction in Area: [2:-183.50%] [3:44.50%] [N/A:N/A] % Reduction in Volume: -325.70% [3:44.00%] [N/A:N/A] Classification: [2:Full Thickness Without Exposed Support Structures] [3:Grade 1] [N/A:N/A] HBO Classification: [2:Grade 1] [3:N/A] [N/A:N/A] Exudate Amount: [2:Medium] [3:N/A] [N/A:N/A] Exudate Type: [2:Serosanguineous] [3:N/A] [N/A:N/A] Exudate Color: [2:red, brown] [3:N/A] [N/A:N/A] Wound Margin: [2:Flat and Intact] [3:N/A] [N/A:N/A] Granulation Amount: [2:Medium (34-66%)] [3:N/A] [N/A:N/A] Granulation Quality: [2:Pink, Pale] [3:N/A] [N/A:N/A] Necrotic Amount: Small (1-33%) N/A N/A Exposed Structures: Fascia: No N/A N/A Fat: No Tendon: No Muscle: No Joint: No Bone: No Limited to Skin Breakdown Epithelialization: None N/A N/A Periwound Skin Texture: Edema: Yes No Abnormalities Noted N/A Excoriation: No Induration: No Callus: No Crepitus: No Fluctuance: No Friable: No Rash: No Scarring: No Periwound Skin Moist: Yes No Abnormalities  Noted N/A Moisture: Maceration: No Dry/Scaly: No Periwound Skin Color: Atrophie Blanche: No No Abnormalities Noted N/A Cyanosis: No Ecchymosis: No Erythema: No Hemosiderin Staining: No Mottled: No Pallor: No Rubor: No Tenderness on Yes No N/A Palpation: Wound Preparation: Ulcer Cleansing: N/A N/A Rinsed/Irrigated with Saline Topical Anesthetic Applied: Other: lidocaine 4% Treatment Notes Electronic Signature(s) Signed: 04/16/2015 4:37:25 PM By: Elpidio Eric BSN, RN Entered By: Elpidio Eric on 04/16/2015 13:43:01 [name], [name] ([id number]) -------------------------------------------------------------------------------- Multi-Disciplinary Care Plan Details Patient Name: [name] Date of Service: 04/16/2015 1:30 PM Medical Record Patient Account Number: [id number] Number: Treating RN: Clover Mealy, RN, BSN, Rita 02-Nov-1945 [phone] y.o. Other Clinician: Date of Birth/Sex: Female) Treating ROBSON, MICHAEL Primary Care Physician: Sherrie Mustache Physician/Extender: G Referring Physician: Sherrie Mustache Weeks in Treatment: 7 Active Inactive Abuse / Safety / Falls / Self Care Management Nursing Diagnoses: Potential for falls Goals: Patient will remain injury free Date Initiated: 02/20/2015 Goal Status: Active Interventions: Assess fall risk on admission and as needed Notes: Nutrition Nursing Diagnoses: Potential for alteratiion in Nutrition/Potential for imbalanced nutrition Goals: Patient/caregiver will maintain therapeutic glucose control Date Initiated: 02/20/2015 Goal Status: Active Interventions: Assess patient nutrition upon admission and as needed per policy Notes: Orientation to the Wound Care Program Nursing Diagnoses: Knowledge deficit related to the wound healing center program Goals: [name], [name] ([id number]) Patient/caregiver will verbalize understanding of the Wound Healing Center Program Date Initiated: 02/20/2015 Goal Status:  Active Interventions: Provide education on orientation to the wound center Notes: Wound/Skin Impairment Nursing Diagnoses: Impaired tissue integrity Goals: Ulcer/skin breakdown will heal within 14 weeks Date Initiated: 02/20/2015 Goal Status: Active Interventions: Assess ulceration(s) every visit Notes: Electronic Signature(s) Signed: 04/16/2015 4:37:25 PM By: Elpidio Eric BSN, RN Entered By: Elpidio Eric on 04/16/2015 13:42:49 [place], [name] ([id number]) -------------------------------------------------------------------------------- Pain Assessment Details Patient Name: [name] Date of Service: 04/16/2015 1:30 PM Medical Record Patient Account Number: [id number] Number: Treating RN: Clover Mealy, RN, BSN, Rita 01-Feb-1946 [phone] y.o. Other Clinician: Date of Birth/Sex: Female) Treating ROBSON, MICHAEL Primary Care Physician: Sherrie Mustache Physician/Extender: G Referring Physician: Sherrie Mustache Weeks in Treatment: 7 Active Problems Location of Pain Severity and Description of Pain Patient Has Paino No Site Locations Pain Management and Medication Current Pain Management: Electronic Signature(s) Signed: 04/16/2015 4:37:25 PM By: Elpidio Eric BSN, RN Entered By: Elpidio Eric on 04/16/2015 13:30:38 [name], [name] ([id number]) -------------------------------------------------------------------------------- Patient/Caregiver Education Details Patient Name: [name] Date of Service: 04/16/2015 1:30 PM Medical Record Patient Account Number: [id number] Number: Treating RN: Clover Mealy, RN, BSN, Rita 1945-10-19 [phone] y.o. Other Clinician: Date of Birth/Gender: Female) Treating ROBSON, MICHAEL Primary Care Physician: Sherrie Mustache Physician/Extender: G Referring Physician: Sherrie Mustache Weeks in Treatment:  7 Education Assessment Education Provided To: Patient Education Topics Provided Welcome To The Wound Care Center: Methods: Explain/Verbal Responses: State  content correctly Electronic Signature(s) Signed: 04/16/2015 4:37:25 PM By: Elpidio Eric BSN, RN Entered By: Elpidio Eric on 04/16/2015 14:45:37 [name], [name] ([id number]) -------------------------------------------------------------------------------- Wound Assessment Details Patient Name: [name] Date of Service: 04/16/2015 1:30 PM Medical Record Patient Account Number: [id number] Number: Treating RN: Clover Mealy, RN, BSN, Rita 27-Apr-1945 [phone] y.o. Other Clinician: Date of Birth/Sex: Female) Treating ROBSON, MICHAEL Primary Care Physician: Sherrie Mustache Physician/Extender: G Referring Physician: Sherrie Mustache Weeks in Treatment: 7 Wound Status Wound Number: 2 Primary Arterial Insufficiency Ulcer Etiology: Wound Location: Left Lower Leg - Medial Wound Open Wounding Event: Blister Status: Date Acquired: 01/28/2015 Comorbid Hypertension, Peripheral Arterial Weeks Of Treatment: 7 History: Disease, Type II Diabetes, Clustered Wound: No Osteoarthritis, Neuropathy Photos Photo Uploaded By: Elpidio Eric on 04/16/2015 16:37:01 Wound Measurements Length: (cm) 1.2 Width: (cm) 1.7 Depth: (cm) 0.3 Area: (cm) 1.602 Volume: (cm) 0.481 % Reduction in Area: -183.5% % Reduction in Volume: -325.7% Epithelialization: None Tunneling: No Undermining: No Wound Description Full Thickness Without Exposed Classification: Support Structures Diabetic Severity Grade 1 (Wagner): Wound Margin: Flat and Intact Exudate Amount: Medium Exudate Type: Serosanguineous Exudate Color: red, brown [name], [name] ([id number]) Foul Odor After Cleansing: No Wound Bed Granulation Amount: Medium (34-66%) Exposed Structure Granulation Quality: Pink, Pale Fascia Exposed: No Necrotic Amount: Small (1-33%) Fat Layer Exposed: No Necrotic Quality: Adherent Slough Tendon Exposed: No Muscle Exposed: No Joint Exposed: No Bone Exposed: No Limited to Skin Breakdown Periwound Skin  Texture Texture Color No Abnormalities Noted: No No Abnormalities Noted: No Callus: No Atrophie Blanche: No Crepitus: No Cyanosis: No Excoriation: No Ecchymosis: No Fluctuance: No Erythema: No Friable: No Hemosiderin Staining: No Induration: No Mottled: No Localized Edema: Yes Pallor: No Rash: No Rubor: No Scarring: No Temperature / Pain Moisture Tenderness on Palpation: Yes No Abnormalities Noted: No Dry / Scaly: No Maceration: No Moist: Yes Wound Preparation Ulcer Cleansing: Rinsed/Irrigated with Saline Topical Anesthetic Applied: Other: lidocaine 4%, Treatment Notes Wound #2 (Left, Medial Lower Leg) 1. Cleansed with: Clean wound with Normal Saline 2. Anesthetic Topical Lidocaine 4% cream to wound bed prior to debridement 4. Dressing Applied: Prisma Ag 5. Secondary Dressing Applied ABD Pad 7. Secured with Tape 3 Layer Compression System - Left Lower Extremity [name], [name] ([id number]) Electronic Signature(s) Signed: 04/16/2015 4:37:25 PM By: Elpidio Eric BSN, RN Entered By: Elpidio Eric on 04/16/2015 13:42:38 [name], [name] ([id number]) -------------------------------------------------------------------------------- Wound Assessment Details Patient Name: [name] Date of Service: 04/16/2015 1:30 PM Medical Record Patient Account Number: [id number] Number: Treating RN: Clover Mealy, RN, BSN, Rita 1945-10-29 [phone] y.o. Other Clinician: Date of Birth/Sex: Female) Treating ROBSON, MICHAEL Primary Care Physician: Sherrie Mustache Physician/Extender: G Referring Physician: Sherrie Mustache Weeks in Treatment: 7 Wound Status Wound Number: 3 Primary Diabetic Wound/Ulcer of the Lower Etiology: Extremity Wound Location: Left, Distal Lower Leg Wound Status: Open Wounding Event: Gradually Appeared Date Acquired: 03/25/2015 Weeks Of Treatment: 1 Clustered Wound: No Photos Photo Uploaded By: Elpidio Eric on 04/16/2015 16:37:02 Wound Measurements Length: (cm)  1 Width: (cm) 1 Depth: (cm) 0.1 Area: (cm) 0.785 Volume: (cm) 0.079 % Reduction in Area: 44.5% % Reduction in Volume: 44% Wound Description Classification: Grade 1 Periwound Skin Texture [name], [name] ([id number]) Texture Color No Abnormalities Noted: No No Abnormalities Noted: No Moisture No Abnormalities Noted: No Treatment Notes Wound #3 (Left, Distal Lower Leg) 1. Cleansed with: Clean wound with Normal  Saline 2. Anesthetic Topical Lidocaine 4% cream to wound bed prior to debridement 4. Dressing Applied: Prisma Ag 5. Secondary Dressing Applied ABD Pad 7. Secured with Tape 3 Layer Compression System - Left Lower Extremity Electronic Signature(s) Signed: 04/16/2015 4:37:25 PM By: Elpidio Eric BSN, RN Entered By: Elpidio Eric on 04/16/2015 13:40:47 [place], [name] ([id number]) -------------------------------------------------------------------------------- Vitals Details Patient Name: [name] Date of Service: 04/16/2015 1:30 PM Medical Record Patient Account Number: [id number] Number: Treating RN: Clover Mealy RN, BSN, Rita 1945-09-07 [phone] y.o. Other Clinician: Date of Birth/Sex: Female) Treating ROBSON, MICHAEL Primary Care Physician: Sherrie Mustache Physician/Extender: G Referring Physician: Sherrie Mustache Weeks in Treatment: 7 Vital Signs Time Taken: 13:32 Temperature (F): 97.5 Height (in): 67 Pulse (bpm): 78 Weight (lbs): 302 Respiratory Rate (breaths/min): 18 Body Mass Index (BMI): 47.3 Blood Pressure (mmHg): 148/81 Reference Range: 80 - 120 mg / dl Electronic Signature(s) Signed: 04/16/2015 4:37:25 PM By: Elpidio Eric BSN, RN Entered By: Elpidio Eric on 04/16/2015 13:33:22

## 2015-04-17 NOTE — Progress Notes (Signed)
[name], [name] ([id number]) Visit Report for 04/16/2015 Chief Complaint Document Details Patient Name: [name], [name] Date of Service: 04/16/2015 1:30 PM Medical Record Patient Account Number: [id number] Number: Treating RN: Clover Mealy, RN, BSN, Rita 1946/02/15 [phone] y.o. Other Clinician: Date of Birth/Sex: Female) Treating ROBSON, MICHAEL Primary Care Physician/Extender: Kerry Kass, [facility] Physician: Referring Physician: Sherrie Mustache Weeks in Treatment: 7 Information Obtained from: Patient Chief Complaint Left calf ulcerations. Arterial insufficiency. Electronic Signature(s) Signed: 04/17/2015 8:09:37 AM By: Baltazar Najjar MD Entered By: Baltazar Najjar on 04/16/2015 16:06:14 Furnish, [name] ([id number]) -------------------------------------------------------------------------------- Debridement Details Patient Name: [name] Date of Service: 04/16/2015 1:30 PM Medical Record Patient Account Number: [id number] Number: Treating RN: Clover Mealy RN, BSN, Rita [birth year] [phone] y.o. Other Clinician: Date of Birth/Sex: Female) Treating ROBSON, MICHAEL Primary Care Physician/Extender: Kerry Kass, [facility] Physician: Referring Physician: Sherrie Mustache Weeks in Treatment: 7 Debridement Performed for Wound #3 Left,Distal Lower Leg Assessment: Performed By: Physician Maxwell Caul, MD Debridement: Open Wound/Selective Debridement Selective Description: Pre-procedure Yes Verification/Time Out Taken: Start Time: 14:25 Pain Control: Lidocaine 4% Topical Solution Level: Non-Viable Tissue Total Area Debrided (L x 1 (cm) x 1 (cm) = 1 (cm) W): Tissue and other Non-Viable, Fibrin/Slough, Subcutaneous material debrided: Instrument: Curette Bleeding: Minimum Hemostasis Achieved: Pressure End Time: 14:30 Procedural Pain: 0 Post Procedural Pain: 0 Response to Treatment: Procedure was tolerated well Post Debridement Measurements of Total Wound Length: (cm) 1 Width: (cm)  1 Depth: (cm) 0.1 Volume: (cm) 0.079 Post Procedure Diagnosis Same as Pre-procedure Electronic Signature(s) Signed: 04/16/2015 4:37:25 PM By: Elpidio Eric BSN, RN Signed: 04/17/2015 8:09:37 AM By: Baltazar Najjar MD [place], [name] ([id number]) Entered By: Baltazar Najjar on 04/16/2015 16:05:35 [name], [name] ([id number]) -------------------------------------------------------------------------------- HPI Details Patient Name: [name] Date of Service: 04/16/2015 1:30 PM Medical Record Patient Account Number: [id number] Number: Treating RN: Clover Mealy, RN, BSN, Rita 10/18/1945 [phone] y.o. Other Clinician: Date of Birth/Sex: Female) Treating ROBSON, MICHAEL Primary Care Physician/Extender: Kerry Kass, [facility] Physician: Referring Physician: Sherrie Mustache Weeks in Treatment: 7 History of Present Illness HPI Description: Pleasant 70 year old with diabetes (no hemoglobin A1c available) and severe peripheral vascular disease. She reports a prior left lower extremity stent. Records unavailable. Underwent multiple level angioplasty of left lower extremity 10/29/2014 by Dr. Wyn Quaker. Developed left calf ulcerations in late December 2016. Denies any trauma. Seen by her PCP. Bactrim prescribed. Referred to the wound clinic. Biopsy culture 02/20/2015 grew methicillin sensitive staph aureus, sensitive to Bactrim. Performing dressing changes with Prisma. Using a Tubigrip for edema control. Scheduled to see Dr. Wyn Quaker in follow-up 02/25/2015. Cancelled because of weather. Rescheduled for 03/06/2015. She returns to clinic for follow-up and is without new complaints. Pain improved. No ischemic rest pain. No fever or chills. Minimal drainage. 03/13/2015 -- records obtained from [facility] shows that she was seen by Dr. dew on 03/12/2015 and given tramadol and other symptomatic treatment. Recent ABI done showed right to be 0.94 and left to be 0.47 a left lower extremity arterial duplex  was notable for an occluded stent and distal ATA. Recommendations were for left lower extremity angiogram with intervention. on 03/11/2015 she was taken up for a aortogram with angioplasty of the left posterior tibial artery and tibioperoneal trunk, above-knee popliteal artery and almost entire SFA, stent placement to the SFA and popliteal artery. 03/20/15 her arterial interventions noted. The patient states her left leg feels better. He ischemic wound on her left anterior leg. She has been using Santyl 04/09/15; the  patient arrives back today. 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 dew. I was concerned about her last week as she had weeping edema fluid and areas of threatened ulceration around her wound. I ordered a silver alginate, Profore light wrap. She comes back today with an Radio broadcast assistant on apparently applied by Dr. Driscilla Grammes office. Her edema is under much better control and the weeping area here last week surrounding her major wound has resolved. She does have another open area which is small and superficial. I'm not sure I would put an Unna boot on this lady's degree of arterial insufficiency. 04/16/15; her original small wound appears to be healthy with good granulation her measurements are improved. She has a small area underneath this. Both underwent selective debridement since surface slough Electronic Signature(s) Signed: 04/17/2015 8:09:37 AM By: Baltazar Najjar MD [name], [name] Entered By: Baltazar Najjar on 04/16/2015 16:07:41 [name], [name] ([id number]) -------------------------------------------------------------------------------- Physical Exam Details Patient Name: [name] Date of Service: 04/16/2015 1:30 PM Medical Record Patient Account Number: [id number] Number: Treating RN: Clover Mealy RN, BSN, Rita Jan 06, 1946 [phone] y.o. Other Clinician: Date of Birth/Sex: Female) Treating ROBSON, MICHAEL Primary  Care Physician/Extender: Kerry Kass, [facility] Physician: Referring Physician: Sherrie Mustache Weeks in Treatment: 7 Notes Wound exam; her edema is under good control even with a Profore light tip. Original wound is smaller with healthy granulation. Selective debridement to remove surface left foot. One small open area below her major wound no evidence of infection, this underwent a selective debridement as well Electronic Signature(s) Signed: 04/17/2015 8:09:37 AM By: Baltazar Najjar MD Entered By: Baltazar Najjar on 04/16/2015 16:08:25 [name], [name] ([id number]) -------------------------------------------------------------------------------- Physician Orders Details Patient Name: [name] Date of Service: 04/16/2015 1:30 PM Medical Record Patient Account Number: [id number] Number: Treating RN: Clover Mealy, RN, BSN, Rita December 01, 1945 [phone] y.o. Other Clinician: Date of Birth/Sex: Female) Treating ROBSON, MICHAEL Primary Care Physician/Extender: Kerry Kass, [facility] Physician: Referring Physician: Augustin Coupe in Treatment: 7 Verbal / Phone Orders: Yes Clinician: Afful, RN, BSN, Rita Read Back and Verified: Yes Diagnosis Coding Wound Cleansing Wound #2 Left,Medial Lower Leg o Cleanse wound with mild soap and water o May shower with protection. Anesthetic Wound #2 Left,Medial Lower Leg o Topical Lidocaine 4% cream applied to wound bed prior to debridement Skin Barriers/Peri-Wound Care Wound #2 Left,Medial Lower Leg o Barrier cream Primary Wound Dressing Wound #2 Left,Medial Lower Leg o Prisma Ag Secondary Dressing Wound #2 Left,Medial Lower Leg o ABD pad Dressing Change Frequency Wound #2 Left,Medial Lower Leg o Other: - Change Tuesday and Friday Follow-up Appointments Wound #2 Left,Medial Lower Leg o Return Appointment in 1 week. Edema Control Wound #2 Left,Medial Lower Leg o 3 Layer Compression System - Left Lower Extremity - use unna wrap to  anchor [name], [name] ([id number]) o Elevate legs to the level of the heart and pump ankles as often as possible Home Health Wound #2 Left,Medial Lower Leg o Continue Home Health Visits o Home Health Nurse may visit PRN to address patientos wound care needs. o FACE TO FACE ENCOUNTER: MEDICARE and MEDICAID PATIENTS: I certify that this patient is under my care and that I had a face-to-face encounter that meets the physician face-to-face encounter requirements with this patient on this date. The encounter with the patient was in whole or in part for the following MEDICAL CONDITION: (primary reason for Home Healthcare) MEDICAL NECESSITY: I certify, that based on my findings, NURSING services are  a medically necessary home health service. HOME BOUND STATUS: I certify that my clinical findings support that this patient is homebound (i.e., Due to illness or injury, pt requires aid of supportive devices such as crutches, cane, wheelchairs, walkers, the use of special transportation or the assistance of another person to leave their place of residence. There is a normal inability to leave the home and doing so requires considerable and taxing effort. Other absences are for medical reasons / religious services and are infrequent or of short duration when for other reasons). o If current dressing causes regression in wound condition, may D/C ordered dressing product/s and apply Normal Saline Moist Dressing daily until next Wound Healing Center / Other MD appointment. Notify Wound Healing Center of regression in wound condition at [phone]. o Please direct any NON-WOUND related issues/requests for orders to patient's Primary Care Physician Electronic Signature(s) Signed: 04/16/2015 4:37:25 PM By: Elpidio Eric BSN, RN Signed: 04/17/2015 8:09:37 AM By: Baltazar Najjar MD Entered By: Elpidio Eric on 04/16/2015 14:31:35 Deharo, [name]  ([id number]) -------------------------------------------------------------------------------- Problem List Details Patient Name: [name] Date of Service: 04/16/2015 1:30 PM Medical Record Patient Account Number: [id number] Number: Treating RN: Clover Mealy RN, BSN, Rita 1945/05/25 [phone] y.o. 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 Other Clinician: Date of Birth/Sex: Female) Treating ROBSON, MICHAEL Primary Care Physician/Extender: Kerry Kass, [facility] Physician: Referring Physician: Sherrie Mustache Weeks in Treatment: 7 Subjective Chief Complaint Information obtained from Patient Left calf ulcerations. Arterial insufficiency. History of Present Illness (HPI) Pleasant 70 year old with diabetes (no hemoglobin A1c available) and severe peripheral vascular disease. She reports a prior left lower extremity stent. Records  unavailable. Underwent multiple level angioplasty of left lower extremity 10/29/2014 by Dr. Wyn Quaker. Developed left calf ulcerations in late December 2016. Denies any trauma. Seen by her PCP. Bactrim prescribed. Referred to the wound clinic. Biopsy culture 02/20/2015 grew methicillin sensitive staph aureus, sensitive to Bactrim. Performing dressing changes with Prisma. Using a Tubigrip for edema control. Scheduled to see Dr. Wyn Quaker in follow-up 02/25/2015. Cancelled because of weather. Rescheduled for 03/06/2015. She returns to clinic for follow-up and is without new complaints. Pain improved. No ischemic rest pain. No fever or chills. Minimal drainage. 03/13/2015 -- records obtained from  vein and vascular shows that she was seen by Dr. dew on 03/12/2015 and given tramadol and other symptomatic treatment. Recent ABI done showed right to be 0.94 and left to be 0.47 a left lower extremity arterial duplex was notable for an occluded stent and distal ATA. Recommendations were for left lower extremity angiogram with intervention. on 03/11/2015 she was taken up for a aortogram with angioplasty of the left posterior tibial artery and tibioperoneal trunk, above-knee popliteal artery  and almost entire SFA, stent placement to the SFA and popliteal artery. 03/20/15 her arterial interventions noted. The patient states her left leg feels better. He ischemic wound on her left anterior leg. She has been using Santyl 04/09/15; the patient arrives back today. 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 dew. I was concerned about her last week as she had weeping edema fluid and areas of threatened ulceration around her wound. I ordered a silver alginate, Profore light wrap. She comes back today with an Radio broadcast assistant on apparently applied by Dr. Driscilla Grammes office. Her edema is under much better control and the weeping area here last week surrounding her major wound has  resolved. She does have another open area which is small and superficial. I'm not sure I would put an Unna boot on this lady's degree of arterial insufficiency. 04/16/15; her original small wound appears to be healthy with good granulation her measurements are improved. She has a small area underneath this. Both underwent selective debridement since surface [name], [name] ([id number]) slough Objective Constitutional Vitals Time Taken: 1:32 PM, Height: 67 in, Weight: 302 lbs, BMI: 47.3, Temperature: 97.5 F, Pulse: 78 bpm, Respiratory Rate: 18 breaths/min, Blood Pressure: 148/81 mmHg. Integumentary (Hair, Skin) Wound #2 status is Open. Original cause of wound was Blister. The wound is located on the Left,Medial Lower Leg. The wound measures 1.2cm length x 1.7cm width x 0.3cm depth; 1.602cm^2 area and 0.481cm^3 volume. The wound is limited to skin breakdown. There is no tunneling or undermining noted. There is a medium amount of serosanguineous drainage noted. The wound margin is flat and intact. There is medium (34-66%) pink, pale granulation within the wound bed. There is a small (1-33%) amount of necrotic tissue within the wound bed including Adherent Slough. The periwound skin appearance exhibited: Localized Edema, Moist. The periwound skin appearance did not exhibit: Callus, Crepitus, Excoriation, Fluctuance, Friable, Induration, Rash, Scarring, Dry/Scaly, Maceration, Atrophie Blanche, Cyanosis, Ecchymosis, Hemosiderin Staining, Mottled, Pallor, Rubor, Erythema. The periwound has tenderness on palpation. Wound #3 status is Open. Original cause of wound was Gradually Appeared. The wound is located on the Left,Distal Lower Leg. The wound measures 1cm length x 1cm width x 0.1cm depth; 0.785cm^2 area and 0.079cm^3 volume. 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 ([id number]) Wound #3 Wound #3 is a Diabetic Wound/Ulcer of the Lower Extremity located on the Left,Distal Lower Leg . There was a Non-Viable Tissue Open Wound/Selective [phone]) debridement with total area of 1 sq cm performed by Maxwell Caul, MD. with the following instrument(s): Curette to remove Non-Viable tissue/material including Fibrin/Slough and Subcutaneous after achieving pain control using Lidocaine 4% Topical Solution. A time out was conducted prior to the start of the procedure. A Minimum amount of bleeding was controlled with Pressure. The procedure was tolerated well with a pain level of 0 throughout and a pain level of 0 following the procedure. Post Debridement Measurements: 1cm length x 1cm width x 0.1cm depth; 0.079cm^3 volume. Post procedure Diagnosis Wound #3: Same as Pre-Procedure Plan Wound Cleansing: Wound #2 Left,Medial Lower Leg: Cleanse wound with mild soap and water May shower with protection. Anesthetic: Wound #2 Left,Medial Lower Leg: Topical Lidocaine  4% cream applied to wound bed prior to debridement Skin Barriers/Peri-Wound Care: Wound #2 Left,Medial Lower Leg: Barrier cream Primary Wound Dressing: Wound #2 Left,Medial Lower Leg: Prisma Ag Secondary Dressing: Wound #2 Left,Medial Lower Leg: ABD pad Dressing Change Frequency: Wound #2 Left,Medial Lower Leg: Other: - Change Tuesday and Friday Follow-up Appointments: Wound #2 Left,Medial Lower Leg: Return Appointment in 1 week. Edema Control: Wound #2 Left,Medial Lower Leg: 3 Layer Compression System - Left Lower Extremity - use unna wrap to anchor Elevate legs to the level of the heart and pump ankles as often as possible Home Health: Wound #2 Left,Medial Lower Leg: Continue  Home Health Visits Home Health Nurse may visit PRN to address patient s wound care needs. FACE TO FACE ENCOUNTER: MEDICARE and MEDICAID PATIENTS: I certify that this patient is under [name], [name] my care and that I had a face-to-face encounter that meets the physician face-to-face encounter requirements with this patient on this date. The encounter with the patient was in whole or in part for the following MEDICAL CONDITION: (primary reason for Home Healthcare) MEDICAL NECESSITY: I certify, that based on my findings, NURSING services are a medically necessary home health service. HOME BOUND STATUS: I certify that my clinical findings support that this patient is homebound (i.e., Due to illness or injury, pt requires aid of supportive devices such as crutches, cane, wheelchairs, walkers, the use of special transportation or the assistance of another person to leave their place of residence. There is a normal inability to leave the home and doing so requires considerable and taxing effort. Other absences are for medical reasons / religious services and are infrequent or of short duration when for other reasons). If current dressing causes regression in wound condition, may D/C ordered dressing product/s and apply Normal Saline Moist Dressing daily until next Wound Healing Center / Other MD appointment. Notify Wound Healing Center of regression in wound condition at [phone]. Please direct any NON-WOUND related issues/requests for orders to patient's Primary Care Physician We will continue with Prisma ABD pad, Profore light compression. I don't think more severe compression as necessary. Electronic Signature(s) Signed: 04/17/2015 8:09:37 AM By: Baltazar Najjar MD Entered By: Baltazar Najjar on 04/16/2015 16:13:47 [name], [name] ([id number]) -------------------------------------------------------------------------------- SuperBill Details Patient Name: [name] Date of  Service: 04/16/2015 Medical Record Patient Account Number: [id number] Number: Treating RN: Clover Mealy RN, BSN, Rita [birth year] [phone] y.o. 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 rest pain, left I70.322 leg Facility Procedures CPT4: Description Modifier Quantity Code [id number] - DEBRIDE WOUND 1ST 20 SQ CM OR < 1 ICD-10 Description Diagnosis 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

## 2015-04-19 DIAGNOSIS — Z95828 Presence of other vascular implants and grafts: Secondary | ICD-10-CM | POA: Diagnosis not present

## 2015-04-19 DIAGNOSIS — L97221 Non-pressure chronic ulcer of left calf limited to breakdown of skin: Secondary | ICD-10-CM | POA: Diagnosis not present

## 2015-04-19 DIAGNOSIS — Z7902 Long term (current) use of antithrombotics/antiplatelets: Secondary | ICD-10-CM | POA: Diagnosis not present

## 2015-04-19 DIAGNOSIS — Z7982 Long term (current) use of aspirin: Secondary | ICD-10-CM | POA: Diagnosis not present

## 2015-04-19 DIAGNOSIS — L97229 Non-pressure chronic ulcer of left calf with unspecified severity: Secondary | ICD-10-CM | POA: Diagnosis not present

## 2015-04-19 DIAGNOSIS — I70342 Atherosclerosis of unspecified type of bypass graft(s) of the left leg with ulceration of calf: Secondary | ICD-10-CM | POA: Diagnosis not present

## 2015-04-19 DIAGNOSIS — Z4800 Encounter for change or removal of nonsurgical wound dressing: Secondary | ICD-10-CM | POA: Diagnosis not present

## 2015-04-19 DIAGNOSIS — Z7984 Long term (current) use of oral hypoglycemic drugs: Secondary | ICD-10-CM | POA: Diagnosis not present

## 2015-04-19 DIAGNOSIS — E1151 Type 2 diabetes mellitus with diabetic peripheral angiopathy without gangrene: Secondary | ICD-10-CM | POA: Diagnosis not present

## 2015-04-23 ENCOUNTER — Encounter: Payer: Medicare Other | Attending: Internal Medicine | Admitting: Internal Medicine

## 2015-04-23 DIAGNOSIS — I739 Peripheral vascular disease, unspecified: Secondary | ICD-10-CM | POA: Diagnosis not present

## 2015-04-23 DIAGNOSIS — I70322 Atherosclerosis of unspecified type of bypass graft(s) of the extremities with rest pain, left leg: Secondary | ICD-10-CM | POA: Insufficient documentation

## 2015-04-23 DIAGNOSIS — L97221 Non-pressure chronic ulcer of left calf limited to breakdown of skin: Secondary | ICD-10-CM | POA: Diagnosis not present

## 2015-04-23 DIAGNOSIS — I70342 Atherosclerosis of unspecified type of bypass graft(s) of the left leg with ulceration of calf: Secondary | ICD-10-CM | POA: Insufficient documentation

## 2015-04-23 DIAGNOSIS — E1140 Type 2 diabetes mellitus with diabetic neuropathy, unspecified: Secondary | ICD-10-CM | POA: Insufficient documentation

## 2015-04-23 DIAGNOSIS — E11622 Type 2 diabetes mellitus with other skin ulcer: Secondary | ICD-10-CM | POA: Diagnosis not present

## 2015-04-23 DIAGNOSIS — I10 Essential (primary) hypertension: Secondary | ICD-10-CM | POA: Insufficient documentation

## 2015-04-23 DIAGNOSIS — M1990 Unspecified osteoarthritis, unspecified site: Secondary | ICD-10-CM | POA: Diagnosis not present

## 2015-04-23 DIAGNOSIS — L97821 Non-pressure chronic ulcer of other part of left lower leg limited to breakdown of skin: Secondary | ICD-10-CM | POA: Diagnosis not present

## 2015-04-25 NOTE — Progress Notes (Signed)
[name], [name] ([id number]) Visit Report for 04/23/2015 Arrival Information Details Patient Name: [name], [name] Date of Service: 04/23/2015 2:15 PM Medical Record Patient Account Number: [id number] Number: Treating RN: [name] MealyAfful, RN, BSN, [name] Jan 29, 1946 [phone](70 y.o. Other Clinician: Date of Birth/Sex: Female) Treating [name] Primary Care Physician: Sherrie MustacheJADALI, [name] Physician/Extender: G Referring Physician: Sherrie MustacheJADALI, [name] Weeks in Treatment: 8 Visit Information History Since Last Visit Added or deleted any medications: No Patient Arrived: Wheel Chair Any new allergies or adverse reactions: No Arrival Time: 14:35 Had a fall or experienced change in No Accompanied By: nephew activities of daily living that may affect Transfer Assistance: None risk of falls: Patient Identification Verified: Yes Signs or symptoms of abuse/neglect since last No Secondary Verification Process Yes visito Completed: Hospitalized since last visit: No Patient Requires Transmission- No Has Dressing in Place as Prescribed: Yes Based Precautions: Pain Present Now: No Patient Has Alerts: Yes Patient Alerts: Patient on Blood Thinner plavix, aspirin 81mg  DMII ABI right 0.94 ABI left 1.16 Electronic Signature(s) Signed: 04/23/2015 4:45:50 PM By: Elpidio EricAfful, [name] BSN, RN Entered By: Elpidio EricAfful, [name] on 04/23/2015 14:35:28 Newhart, [name] [name] ([id number]) -------------------------------------------------------------------------------- Encounter Discharge Information Details Patient Name: [name], [name] Date of Service: 04/23/2015 2:15 PM Medical Record Patient Account Number: [id number] Number: Treating RN: [name] MealyAfful, RN, BSN, [name] Jan 29, 1946 [phone](70 y.o. Other Clinician: Date of Birth/Sex: Female) Treating [name] Primary Care Physician: Sherrie MustacheJADALI, [name] Physician/Extender: G Referring Physician: Sherrie MustacheJADALI, [name] Weeks in Treatment: 8 Encounter Discharge Information Items Discharge Pain Level:  0 Discharge Condition: Stable Ambulatory Status: Wheelchair Discharge Destination: Home Private Transportation: Auto Accompanied By: nephew Schedule Follow-up Appointment: No Medication Reconciliation completed and No provided to Patient/Care Esabella Stockinger: Clinical Summary of Care: Electronic Signature(s) Signed: 04/23/2015 4:45:50 PM By: Elpidio EricAfful, [name] BSN, RN Previous Signature: 04/23/2015 3:26:17 PM Version By: Gwenlyn PerkingMoore, Shelia Entered By: Elpidio EricAfful, [name] on 04/23/2015 15:28:26 Reamer, [name] [name] ([id number]) -------------------------------------------------------------------------------- Lower Extremity Assessment Details Patient Name: [name], [name] Date of Service: 04/23/2015 2:15 PM Medical Record Patient Account Number: [id number] Number: Treating RN: [name] MealyAfful, RN, BSN, [name] Jan 29, 1946 [phone](70 y.o. Other Clinician: Date of Birth/Sex: Female) Treating [name] Primary Care Physician: Sherrie MustacheJADALI, [name] Physician/Extender: G Referring Physician: Sherrie MustacheJADALI, [name] Weeks in Treatment: 8 Vascular Assessment Pulses: Posterior Tibial Dorsalis Pedis Palpable: [Left:Yes] Extremity colors, hair growth, and conditions: Extremity Color: [Left:Mottled] Hair Growth on Extremity: [Left:No] Capillary Refill: [Left:< 3 seconds] Electronic Signature(s) Signed: 04/23/2015 4:45:50 PM By: Elpidio EricAfful, [name] BSN, RN Entered By: Elpidio EricAfful, [name] on 04/23/2015 14:35:52 [name], [name] [name] ([id number]) -------------------------------------------------------------------------------- Multi Wound Chart Details Patient Name: [name], [name] Date of Service: 04/23/2015 2:15 PM Medical Record Patient Account Number: [id number] Number: Treating RN: [name] MealyAfful, RN, BSN, [name] Jan 29, 1946 [phone](70 y.o. 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] [N/A:N/A] Comorbid History: [2:Hypertension, Peripheral Hypertension, Peripheral Arterial Disease, Type II Arterial Disease, Type II Diabetes, Osteoarthritis, Diabetes, Osteoarthritis, Neuropathy] [3:Neuropathy] [N/A:N/A] Date Acquired: [2:01/28/2015] [3:03/25/2015] [N/A:N/A] Weeks of Treatment: [2:8] [3:2] [N/A:N/A] Wound Status: [2:Open] [3:Healed - Epithelialized] [N/A:N/A] Measurements L x W x D 1.5x1.5x0.1 [3:0x0x0] [N/A:N/A] (cm) Area (cm) : [  2:1.767] [3:0] [N/A:N/A] Volume (cm) : [2:0.177] [3:0] [N/A:N/A] % Reduction in Area: [2:-212.70%] [3:100.00%] [N/A:N/A] % Reduction in Volume: -56.60% [3:100.00%] [N/A:N/A] Classification: [2:Full Thickness Without Exposed Support Structures] [3:Grade 1] [N/A:N/A] HBO Classification: [2:Grade 1] [3:N/A] [N/A:N/A] Exudate Amount: [2:Medium] [3:None Present] [N/A:N/A] Exudate Type: [2:Serosanguineous] [3:N/A] [N/A:N/A] Exudate Color: [2:red, brown] [3:N/A] [N/A:N/A] Wound Margin: [2:Flat and Intact] [3:Distinct, outline attached] [N/A:N/A] Granulation Amount: [2:Large (67-100%)] [3:None Present (0%)] [N/A:N/A] Granulation Quality: [2:Pink, Pale] [3:N/A] [N/A:N/A] Necrotic Amount: None Present (0%) None Present (0%) N/A Exposed Structures: Fascia: No Fascia: No N/A Fat: No Fat: No Tendon: No Tendon: No Muscle: No Muscle: No Joint: No Joint: No Bone: No Bone: No Limited to Skin Limited to Skin Breakdown Breakdown Epithelialization: None None N/A Periwound Skin Texture: Edema: Yes Edema: Yes N/A Excoriation:  No Excoriation: No Induration: No Induration: No Callus: No Callus: No Crepitus: No Crepitus: No Fluctuance: No Fluctuance: No Friable: No Friable: No Rash: No Rash: No Scarring: No Scarring: No Periwound Skin Moist: Yes Dry/Scaly: Yes N/A Moisture: Maceration: No Maceration: No Dry/Scaly: No Moist: No Periwound Skin Color: Atrophie Blanche: No Atrophie Blanche: No N/A Cyanosis: No Cyanosis: No Ecchymosis: No Ecchymosis: No Erythema: No Erythema: No Hemosiderin Staining: No Hemosiderin Staining: No Mottled: No Mottled: No Pallor: No Pallor: No Rubor: No Rubor: No Temperature: N/A No Abnormality N/A Tenderness on Yes No N/A Palpation: Wound Preparation: Ulcer Cleansing: Ulcer Cleansing: N/A Rinsed/Irrigated with Rinsed/Irrigated with Saline Saline Topical Anesthetic Topical Anesthetic Applied: Other: lidocaine Applied: None 4% Treatment Notes Electronic Signature(s) Signed: 04/23/2015 4:45:50 PM By: Elpidio Eric BSN, RN Entered By: Elpidio Eric on 04/23/2015 14:52:31 [name], [name] ([id number]) -------------------------------------------------------------------------------- Multi-Disciplinary Care Plan Details Patient Name: [name] Date of Service: 04/23/2015 2:15 PM Medical Record Patient Account Number: [id number] Number: Treating RN: [name] Mealy, RN, BSN, [name] Sep 11, 1945 [phone] y.o. Other Clinician: Date of Birth/Sex: Female) Treating [name] Primary Care Physician: Sherrie Mustache Physician/Extender: G Referring Physician: Sherrie Mustache Weeks in Treatment: 8 Active Inactive Abuse / Safety / Falls / Self Care Management Nursing Diagnoses: Potential for falls Goals: Patient will remain injury free Date Initiated: 02/20/2015 Goal Status: Active Interventions: Assess fall risk on admission and as needed Notes: Nutrition Nursing Diagnoses: Potential for alteratiion in Nutrition/Potential for imbalanced  nutrition Goals: Patient/caregiver will maintain therapeutic glucose control Date Initiated: 02/20/2015 Goal Status: Active Interventions: Assess patient nutrition upon admission and as needed per policy Notes: Orientation to the Wound Care Program Nursing Diagnoses: Knowledge deficit related to the wound healing center program Goals: [name], [name] ([id number]) Patient/caregiver will verbalize understanding of the Wound Healing Center Program Date Initiated: 02/20/2015 Goal Status: Active Interventions: Provide education on orientation to the wound center Notes: Wound/Skin Impairment Nursing Diagnoses: Impaired tissue integrity Goals: Ulcer/skin breakdown will heal within 14 weeks Date Initiated: 02/20/2015 Goal Status: Active Interventions: Assess ulceration(s) every visit Notes: Electronic Signature(s) Signed: 04/23/2015 4:45:50 PM By: Elpidio Eric BSN, RN Entered By: Elpidio Eric on 04/23/2015 14:49:55 Bartram, [name] ([id number]) -------------------------------------------------------------------------------- Pain Assessment Details Patient Name: [name] Date of Service: 04/23/2015 2:15 PM Medical Record Patient Account Number: [id number] Number: Treating RN: [name] Mealy, RN, BSN, [name] [birth year] [phone] y.o. Other Clinician: Date of Birth/Sex: Female) Treating [name] Primary Care Physician: Sherrie Mustache Physician/Extender: G Referring Physician: Sherrie Mustache Weeks in Treatment: 8 Active Problems Location of Pain Severity and Description of Pain Patient Has Paino No Site Locations Pain Management and Medication Current Pain Management: Electronic Signature(s) Signed: 04/23/2015 4:45:50 PM By: Elpidio Eric BSN,  RN Entered By: Elpidio Eric on 04/23/2015 14:35:35 Mejorado, [name] ([id number]) -------------------------------------------------------------------------------- Wound Assessment Details Patient Name: [name], [name] Date of Service: 04/23/2015 2:15  PM Medical Record Patient Account Number: [id number] Number: Treating RN: [name] Mealy, RN, BSN, [name] Oct 30, 1945 [phone] y.o. Other Clinician: Date of Birth/Sex: Female) Treating [name] Primary Care Physician: Sherrie Mustache Physician/Extender: G Referring Physician: Sherrie Mustache Weeks in Treatment: 8 Wound Status Wound Number: 2 Primary Arterial Insufficiency Ulcer Etiology: Wound Location: Left Lower Leg - Medial Wound Open Wounding Event: Blister Status: Date Acquired: 01/28/2015 Comorbid Hypertension, Peripheral Arterial Weeks Of Treatment: 8 History: Disease, Type II Diabetes, Clustered Wound: No Osteoarthritis, Neuropathy Wound Measurements Length: (cm) 1.5 Width: (cm) 1.5 Depth: (cm) 0.1 Area: (cm) 1.767 Volume: (cm) 0.177 % Reduction in Area: -212.7% % Reduction in Volume: -56.6% Epithelialization: None Tunneling: No Undermining: No Wound Description Full Thickness Without Exposed Classification: Support Structures Diabetic Severity Grade 1 (Wagner): Wound Margin: Flat and Intact Exudate Amount: Medium Exudate Type: Serosanguineous Exudate Color: red, brown Foul Odor After Cleansing: No Wound Bed Granulation Amount: Large (67-100%) Exposed Structure Granulation Quality: Pink, Pale Fascia Exposed: No Necrotic Amount: None Present (0%) Fat Layer Exposed: No Tendon Exposed: No Muscle Exposed: No Joint Exposed: No Bone Exposed: No Limited to Skin Breakdown Periwound Skin Texture Texture Color [name], [name] ([id number]) No Abnormalities Noted: No No Abnormalities Noted: No Callus: No Atrophie Blanche: No Crepitus: No Cyanosis: No Excoriation: No Ecchymosis: No Fluctuance: No Erythema: No Friable: No Hemosiderin Staining: No Induration: No Mottled: No Localized Edema: Yes Pallor: No Rash: No Rubor: No Scarring: No Temperature / Pain Moisture Tenderness on Palpation: Yes No Abnormalities Noted: No Dry / Scaly:  No Maceration: No Moist: Yes Wound Preparation Ulcer Cleansing: Rinsed/Irrigated with Saline Topical Anesthetic Applied: Other: lidocaine 4%, Treatment Notes Wound #2 (Left, Medial Lower Leg) 1. Cleansed with: Cleanse wound with antibacterial soap and water 3. Peri-wound Care: Barrier cream Moisturizing lotion 4. Dressing Applied: Prisma Ag 5. Secondary Dressing Applied ABD Pad 7. Secured with 3 Layer Compression System - Left Lower Extremity Electronic Signature(s) Signed: 04/23/2015 4:45:50 PM By: Elpidio Eric BSN, RN Entered By: Elpidio Eric on 04/23/2015 14:49:12 [name], [name] ([id number]) -------------------------------------------------------------------------------- Wound Assessment Details Patient Name: [name] Date of Service: 04/23/2015 2:15 PM Medical Record Patient Account Number: [id number] Number: Treating RN: [name] Mealy, RN, BSN, [name] Dec 12, 1945 [phone] y.o. Other Clinician: Date of Birth/Sex: Female) Treating [name] Primary Care Physician: Sherrie Mustache Physician/Extender: G Referring Physician: Sherrie Mustache Weeks in Treatment: 8 Wound Status Wound Number: 3 Primary Diabetic Wound/Ulcer of the Lower Etiology: Extremity Wound Location: Left Lower Leg - Distal Wound Healed - Epithelialized Wounding Event: Gradually Appeared Status: Date Acquired: 03/25/2015 Comorbid Hypertension, Peripheral Arterial Weeks Of Treatment: 2 History: Disease, Type II Diabetes, Clustered Wound: No Osteoarthritis, Neuropathy Wound Measurements Length: (cm) 0 % Reductio Width: (cm) 0 % Reductio Depth: (cm) 0 Epithelial Area: (cm) 0 Tunneling Volume: (cm) 0 Undermini n in Area: 100% n in Volume: 100% ization: None : No ng: No Wound Description Classification: Grade 1 Wound Margin: Distinct, outline attached Exudate Amount: None Present Foul Odor After Cleansing: No Wound Bed Granulation Amount: None Present (0%) Exposed Structure Necrotic  Amount: None Present (0%) Fascia Exposed: No Fat Layer Exposed: No Tendon Exposed: No Muscle Exposed: No Joint Exposed: No Bone Exposed: No Limited to Skin Breakdown Periwound Skin Texture Texture Color No Abnormalities Noted: No No Abnormalities Noted: No Callus: No Atrophie Blanche: No Crepitus: No Cyanosis: No  Excoriation: No Ecchymosis: No KRISTEL, DURKEE ([id number]) Fluctuance: No Erythema: No Friable: No Hemosiderin Staining: No Induration: No Mottled: No Localized Edema: Yes Pallor: No Rash: No Rubor: No Scarring: No Temperature / Pain Moisture Temperature: No Abnormality No Abnormalities Noted: No Dry / Scaly: Yes Maceration: No Moist: No Wound Preparation Ulcer Cleansing: Rinsed/Irrigated with Saline Topical Anesthetic Applied: None Electronic Signature(s) Signed: 04/23/2015 4:45:50 PM By: Elpidio Eric BSN, RN Entered By: Elpidio Eric on 04/23/2015 14:49:37 Pafford, [name] ([id number]) -------------------------------------------------------------------------------- Vitals Details Patient Name: [name] Date of Service: 04/23/2015 2:15 PM Medical Record Patient Account Number: [id number] Number: Treating RN: [name] Mealy, RN, BSN, [name] [birth year] [phone] y.o. Other Clinician: Date of Birth/Sex: Female) Treating [name] Primary Care Physician: Sherrie Mustache Physician/Extender: G Referring Physician: Sherrie Mustache Weeks in Treatment: 8 Vital Signs Time Taken: 14:37 Temperature (F): 98.3 Height (in): 67 Pulse (bpm): 75 Weight (lbs): 302 Respiratory Rate (breaths/min): 18 Body Mass Index (BMI): 47.3 Blood Pressure (mmHg): 112/48 Reference Range: 80 - 120 mg / dl Electronic Signature(s) Signed: 04/23/2015 4:45:50 PM By: Elpidio Eric BSN, RN Entered By: Elpidio Eric on 04/23/2015 14:40:16

## 2015-04-25 NOTE — Progress Notes (Signed)
[name] ([id number]) Visit Report for 04/23/2015 Chief Complaint Document Details Patient Name: [name], [name] Date of Service: 04/23/2015 2:15 PM Medical Record Patient Account Number: [id number] Number: Treating RN: Clover Mealy, RN, BSN, Rita 06-Aug-1945 [phone] y.o. Other Clinician: Date of Birth/Sex: Female) Treating ROBSON, MICHAEL Primary Care Physician/Extender: Kerry Kass, [facility] Physician: Referring Physician: Sherrie Mustache Weeks in Treatment: 8 Information Obtained from: Patient Chief Complaint Left calf ulcerations. Arterial insufficiency. Electronic Signature(s) Signed: 04/24/2015 4:53:46 PM By: Baltazar Najjar MD Entered By: Baltazar Najjar on 04/23/2015 15:07:14 Cunanan, [name] ([id number]) -------------------------------------------------------------------------------- HPI Details Patient Name: [name] Date of Service: 04/23/2015 2:15 PM Medical Record Patient Account Number: [id number] Number: Treating RN: Clover Mealy RN, BSN, Rita Dec 31, 1945 [phone] y.o. Other Clinician: Date of Birth/Sex: Female) Treating ROBSON, MICHAEL Primary Care Physician/Extender: Kerry Kass, [facility] Physician: Referring Physician: Sherrie Mustache Weeks in Treatment: 8 History of Present Illness HPI Description: Pleasant 70 year old with diabetes (no hemoglobin A1c available) and severe peripheral vascular disease. She reports a prior left lower extremity stent. Records unavailable. Underwent multiple level angioplasty of left lower extremity 10/29/2014 by Dr. Wyn Quaker. Developed left calf ulcerations in late December 2016. Denies any trauma. Seen by her PCP. Bactrim prescribed. Referred to the wound clinic. Biopsy culture 02/20/2015 grew methicillin sensitive staph aureus, sensitive to Bactrim. Performing dressing changes with Prisma. Using a Tubigrip for edema control. Scheduled to see Dr. Wyn Quaker in follow-up 02/25/2015. Cancelled because of weather. Rescheduled for 03/06/2015. She  returns to clinic for follow-up and is without new complaints. Pain improved. No ischemic rest pain. No fever or chills. Minimal drainage. 03/13/2015 -- records obtained from [facility] shows that she was seen by Dr. dew on 03/12/2015 and given tramadol and other symptomatic treatment. Recent ABI done showed right to be 0.94 and left to be 0.47 a left lower extremity arterial duplex was notable for an occluded stent and distal ATA. Recommendations were for left lower extremity angiogram with intervention. on 03/11/2015 she was taken up for a aortogram with angioplasty of the left posterior tibial artery and tibioperoneal trunk, above-knee popliteal artery and almost entire SFA, stent placement to the SFA and popliteal artery. 03/20/15 her arterial interventions noted. The patient states her left leg feels better. He ischemic wound on her left anterior leg. She has been using Santyl 04/09/15; the patient arrives back today. 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 dew. I was concerned about her last week as she had weeping edema fluid and areas of threatened ulceration around her wound. I ordered a silver alginate, Profore light wrap. She comes back today with an Radio broadcast assistant on apparently applied by Dr. Driscilla Grammes office. Her edema is under much better control and the weeping area here last week surrounding her major wound has resolved. She does have another open area which is small and superficial. I'm not sure I would put an Unna boot on this lady's degree of arterial insufficiency. 04/16/15; her original small wound appears to be healthy with good granulation her measurements are improved. She has a small area underneath this. Both underwent selective debridement since surface slough 04/23/15; the original wound has filled in there is healthy granulation here. The small area underneath this is healed. No debridement was required SKYELER, SCALESE ([id number]) Electronic Signature(s) Signed: 04/24/2015 4:53:46 PM By: Baltazar Najjar MD Entered By: Baltazar Najjar on 04/23/2015 15:07:46 Lipsitz, [name] ([id number]) -------------------------------------------------------------------------------- Physical Exam Details Patient Name: [name] Date of  Service: 04/23/2015 2:15 PM Medical Record Patient Account Number: [id number] Number: Treating RN: Clover Mealy, RN, BSN, Rita 06-May-1945 [phone] y.o. Other Clinician: Date of Birth/Sex: Female) Treating ROBSON, MICHAEL Primary Care Physician/Extender: Kerry Kass, [facility] Physician: Referring Physician: Sherrie Mustache Weeks in Treatment: 8 Constitutional Sitting or standing Blood Pressure is within target range for patient.. Pulse regular and within target range for patient.Marland Kitchen Respirations regular, non-labored and within target range.. Temperature is normal and within the target range for the patient.. Patient appears well and in no distress.. Cardiovascular Dorsalis pedis pulses palpable in the left foot. Extremities are free of varicosities, clubbing or edema. Peripheral pulses strong and equal. Capillary refill < 3 seconds.. Lymphatic Not palpable in the popliteal or inguinal area. Notes Wound exam; her edema is under good control with the Profore light wrap. Original wound has healthy granulation. No debridement is required. The satellite lesion has closed Electronic Signature(s) Signed: 04/24/2015 4:53:46 PM By: Baltazar Najjar MD Entered By: Baltazar Najjar on 04/23/2015 15:09:40 Saliba, [name] ([id number]) -------------------------------------------------------------------------------- Physician Orders Details Patient Name: [name] Date of Service: 04/23/2015 2:15 PM Medical Record Patient Account Number: [id number] Number: Treating RN: Clover Mealy RN, BSN, Rita 1946-02-15 [phone] y.o. Other Clinician: Date of Birth/Sex: Female) Treating ROBSON, MICHAEL Primary  Care Physician/Extender: Kerry Kass, [facility] Physician: Referring Physician: Augustin Coupe in Treatment: 8 Verbal / Phone Orders: Yes Clinician: Afful, RN, BSN, Rita Read Back and Verified: Yes Diagnosis Coding Wound Cleansing Wound #2 Left,Medial Lower Leg o Cleanse wound with mild soap and water o May shower with protection. Anesthetic Wound #2 Left,Medial Lower Leg o Topical Lidocaine 4% cream applied to wound bed prior to debridement Skin Barriers/Peri-Wound Care Wound #2 Left,Medial Lower Leg o Barrier cream Primary Wound Dressing Wound #2 Left,Medial Lower Leg o Prisma Ag Secondary Dressing Wound #2 Left,Medial Lower Leg o ABD pad Dressing Change Frequency Wound #2 Left,Medial Lower Leg o Other: - Change Tuesday and Friday Follow-up Appointments Wound #2 Left,Medial Lower Leg o Return Appointment in 1 week. Edema Control Wound #2 Left,Medial Lower Leg o 3 Layer Compression System - Left Lower Extremity - use unna wrap to anchor Sheehy, Evangelyn N. ([id number]) o Elevate legs to the level of the heart and pump ankles as often as possible Home Health Wound #2 Left,Medial Lower Leg o Continue Home Health Visits o Home Health Nurse may visit PRN to address patientos wound care needs. o FACE TO FACE ENCOUNTER: MEDICARE and MEDICAID PATIENTS: I certify that this patient is under my care and that I had a face-to-face encounter that meets the physician face-to-face encounter requirements with this patient on this date. The encounter with the patient was in whole or in part for the following MEDICAL CONDITION: (primary reason for Home Healthcare) MEDICAL NECESSITY: I certify, that based on my findings, NURSING services are a medically necessary home health service. HOME BOUND STATUS: I certify that my clinical findings support that this patient is homebound (i.e., Due to illness or injury, pt requires aid of supportive devices such as crutches,  cane, wheelchairs, walkers, the use of special transportation or the assistance of another person to leave their place of residence. There is a normal inability to leave the home and doing so requires considerable and taxing effort. Other absences are for medical reasons / religious services and are infrequent or of short duration when for other reasons). o If current dressing causes regression in wound condition, may D/C ordered dressing product/s and apply Normal Saline Moist Dressing daily  until next Wound Healing Center / Other MD appointment. Notify Wound Healing Center of regression in wound condition at [phone]. o Please direct any NON-WOUND related issues/requests for orders to patient's Primary Care Physician Electronic Signature(s) Signed: 04/23/2015 4:45:50 PM By: Elpidio EricAfful, Rita BSN, RN Signed: 04/24/2015 4:53:46 PM By: Baltazar Najjarobson, Michael MD Entered By: Elpidio EricAfful, Rita on 04/23/2015 15:05:29 [name] InfanteWOODS, Alany N. ([id number]) -------------------------------------------------------------------------------- Problem List Details Patient Name: [name] InfanteWOODS, Yomaris N. Date of Service: 04/23/2015 2:15 PM Medical Record Patient Account Number: [id number] Number: Treating RN: Clover MealyAfful, RN, BSN, Rita August 03, 1945 [phone](69 y.o. 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 ([id number]) -------------------------------------------------------------------------------- Progress Note Details Patient Name: [name] InfanteWOODS, Shanterica N. Date of Service: 04/23/2015 2:15 PM Medical Record Patient Account Number: [id number] Number: Treating RN: Clover MealyAfful, RN, BSN, Rita August 03, 1945 [phone](69 y.o. Other Clinician: Date of Birth/Sex: Female) Treating ROBSON, MICHAEL Primary Care Physician/Extender: Kerry KassG JADALI, [facility] Physician: Referring Physician: Sherrie MustacheJADALI, FAYEGH Weeks in Treatment: 8 Subjective Chief Complaint Information obtained from Patient Left calf ulcerations. Arterial insufficiency. History of Present Illness (HPI) Pleasant 70 year old with diabetes (no hemoglobin A1c available) and severe peripheral vascular disease. She reports a prior left lower extremity stent. Records unavailable. Underwent multiple level angioplasty of left lower extremity 10/29/2014 by Dr. Wyn Quakerew. Developed left calf ulcerations in late December 2016. Denies any trauma. Seen by her PCP. Bactrim prescribed. Referred to the wound clinic. Biopsy culture 02/20/2015 grew methicillin sensitive staph aureus, sensitive to Bactrim. Performing dressing changes with Prisma. Using a Tubigrip for edema control. Scheduled to see Dr. Wyn Quakerew in follow-up 02/25/2015. Cancelled because of weather. Rescheduled for 03/06/2015. She returns to clinic for follow-up and is without new complaints. Pain improved. No ischemic rest pain. No fever or chills. Minimal drainage. 03/13/2015 -- records obtained from [facility] shows that she was seen by Dr. dew on 03/12/2015 and given tramadol and other symptomatic treatment. Recent ABI done showed right to be 0.94 and left to be 0.47 a left lower extremity arterial duplex was notable for an occluded stent and distal ATA. Recommendations were for  left lower extremity angiogram with intervention. on 03/11/2015 she was taken up for a aortogram with angioplasty of the left posterior tibial artery and tibioperoneal trunk, above-knee popliteal artery and almost entire SFA, stent placement to the SFA and popliteal artery. 03/20/15 her arterial interventions noted. The patient states her left leg feels better. He ischemic wound on her left anterior leg. She has been using Santyl 04/09/15; the patient arrives back today. 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 dew. I was concerned about her last week as she had weeping edema fluid and areas of threatened ulceration around her wound. I ordered a silver alginate, Profore light wrap. She comes back today with an Radio broadcast assistantUnna boot on apparently applied by Dr. Driscilla Grammesdew's office. Her edema is under much better control and the weeping area here  last week surrounding her major wound has resolved. She does have another open area which is small and superficial. I'm not sure I would put an Unna boot on this lady's degree of arterial insufficiency. 04/16/15; her original small wound appears to be healthy with good granulation her measurements are improved. She has a small area underneath this. Both underwent selective debridement since surface Concannon, Juliene N. ([id number]) slough 04/23/15; the original wound has filled in there is healthy granulation here. The small area underneath this is healed. No debridement was required Objective Constitutional Sitting or standing Blood Pressure is within target range for patient.. Pulse regular and within target range for patient.Marland Kitchen Respirations regular, non-labored and within target range.. Temperature is normal and within the target range for the patient.. Patient appears well and in no distress.. Vitals Time Taken: 2:37 PM, Height: 67 in, Weight: 302 lbs, BMI: 47.3, Temperature: 98.3 F, Pulse: 75 bpm, Respiratory Rate: 18  breaths/min, Blood Pressure: 112/48 mmHg. Cardiovascular Dorsalis pedis pulses palpable in the left foot. Extremities are free of varicosities, clubbing or edema. Peripheral pulses strong and equal. Capillary refill < 3 seconds.. Lymphatic Not palpable in the popliteal or inguinal area. General Notes: Wound exam; her edema is under good control with the Profore light wrap. Original wound has healthy granulation. No debridement is required. The satellite lesion has closed Integumentary (Hair, Skin) Wound #2 status is Open. Original cause of wound was Blister. The wound is located on the Left,Medial Lower Leg. The wound measures 1.5cm length x 1.5cm width x 0.1cm depth; 1.767cm^2 area and 0.177cm^3 volume. The wound is limited to skin breakdown. There is no tunneling or undermining noted. There is a medium amount of serosanguineous drainage noted. The wound margin is flat and intact. There is large (67-100%) pink, pale granulation within the wound bed. There is no necrotic tissue within the wound bed. The periwound skin appearance exhibited: Localized Edema, Moist. The periwound skin appearance did not exhibit: Callus, Crepitus, Excoriation, Fluctuance, Friable, Induration, Rash, Scarring, Dry/Scaly, Maceration, Atrophie Blanche, Cyanosis, Ecchymosis, Hemosiderin Staining, Mottled, Pallor, Rubor, Erythema. The periwound has tenderness on palpation. Wound #3 status is Healed - Epithelialized. Original cause of wound was Gradually Appeared. The wound is located on the Left,Distal Lower Leg. The wound measures 0cm length x 0cm width x 0cm depth; 0cm^2 area and 0cm^3 volume. The wound is limited to skin breakdown. There is no tunneling or undermining noted. There is a none present amount of drainage noted. The wound margin is distinct with the outline attached to the wound base. There is no granulation within the wound bed. There is no necrotic tissue within the wound bed. The periwound skin  appearance exhibited: Localized Edema, Dry/Scaly. The periwound skin appearance did not exhibit: Callus, Crepitus, Excoriation, Fluctuance, Friable, Induration, Rash, Scarring, Maceration, Moist, Atrophie Blanche, Cyanosis, Ecchymosis, Hemosiderin Staining, Lewter, Ashantee N. ([id number]) Mottled, Pallor, Rubor, Erythema. Periwound temperature was noted as No Abnormality. 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 Anesthetic: Wound #2 Left,Medial Lower Leg: Topical Lidocaine 4% cream applied to wound bed prior to debridement Skin Barriers/Peri-Wound Care: Wound #2 Left,Medial Lower Leg: Barrier cream  Primary Wound Dressing: Wound #2 Left,Medial Lower Leg: Prisma Ag Secondary Dressing: Wound #2 Left,Medial Lower Leg: ABD pad Dressing Change Frequency: Wound #2 Left,Medial Lower Leg: Other: - Change Tuesday and Friday Follow-up Appointments: Wound #2 Left,Medial Lower Leg: Return Appointment in 1 week. Edema Control: Wound #2 Left,Medial Lower Leg: 3 Layer Compression System - Left Lower Extremity - use unna wrap to anchor Elevate legs to the level of the heart and pump ankles as often as possible Home Health: Wound #2 Left,Medial Lower Leg: MELICIA, ESQUEDA ([id number]) Continue Home Health Visits Home Health Nurse may visit PRN to address patient s wound care needs. FACE TO FACE ENCOUNTER: MEDICARE and MEDICAID PATIENTS: I certify that this patient is under my care and that I had a face-to-face encounter that meets the physician face-to-face  encounter requirements with this patient on this date. The encounter with the patient was in whole or in part for the following MEDICAL CONDITION: (primary reason for Home Healthcare) MEDICAL NECESSITY: I certify, that based on my findings, NURSING services are a medically necessary home health service. HOME BOUND STATUS: I certify that my clinical findings support that this patient is homebound (i.e., Due to illness or injury, pt requires aid of supportive devices such as crutches, cane, wheelchairs, walkers, the use of special transportation or the assistance of another person to leave their place of residence. There is a normal inability to leave the home and doing so requires considerable and taxing effort. Other absences are for medical reasons / religious services and are infrequent or of short duration when for other reasons). If current dressing causes regression in wound condition, may D/C ordered dressing product/s and apply Normal Saline Moist Dressing daily until next Wound Healing Center / Other MD appointment. Notify Wound Healing Center of regression in wound condition at [phone]. Please direct any NON-WOUND related issues/requests for orders to patient's Primary Care Physician #1 there is no reason to change her current dressing which is Prisma under a Profore light #2 I suspect the genesis of this wound is combined arterial and venous insufficiency. Her peripheral pulses are intact and her foot and her forefoot is warm capillary refill time is normal. #3 if the wound stalls could consider an advanced treatment option with either Epifix or TheraSkin Electronic Signature(s) Signed: 04/24/2015 4:53:46 PM By: Baltazar Najjar MD Entered By: Baltazar Najjar on 04/23/2015 15:11:14 [place], [name] ([id number]) -------------------------------------------------------------------------------- SuperBill Details Patient Name: [name] Date of Service: 04/23/2015 Medical Record  Patient Account Number: [id number] Number: Treating RN: Clover Mealy RN, BSN, Rita 07/09/1945 [phone] y.o. 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 

## 2015-04-26 DIAGNOSIS — L97221 Non-pressure chronic ulcer of left calf limited to breakdown of skin: Secondary | ICD-10-CM | POA: Diagnosis not present

## 2015-04-26 DIAGNOSIS — Z7982 Long term (current) use of aspirin: Secondary | ICD-10-CM | POA: Diagnosis not present

## 2015-04-26 DIAGNOSIS — E1151 Type 2 diabetes mellitus with diabetic peripheral angiopathy without gangrene: Secondary | ICD-10-CM | POA: Diagnosis not present

## 2015-04-26 DIAGNOSIS — L97229 Non-pressure chronic ulcer of left calf with unspecified severity: Secondary | ICD-10-CM | POA: Diagnosis not present

## 2015-04-26 DIAGNOSIS — Z4800 Encounter for change or removal of nonsurgical wound dressing: Secondary | ICD-10-CM | POA: Diagnosis not present

## 2015-04-26 DIAGNOSIS — Z7902 Long term (current) use of antithrombotics/antiplatelets: Secondary | ICD-10-CM | POA: Diagnosis not present

## 2015-04-26 DIAGNOSIS — Z95828 Presence of other vascular implants and grafts: Secondary | ICD-10-CM | POA: Diagnosis not present

## 2015-04-26 DIAGNOSIS — Z7984 Long term (current) use of oral hypoglycemic drugs: Secondary | ICD-10-CM | POA: Diagnosis not present

## 2015-04-26 DIAGNOSIS — I70342 Atherosclerosis of unspecified type of bypass graft(s) of the left leg with ulceration of calf: Secondary | ICD-10-CM | POA: Diagnosis not present

## 2015-04-30 ENCOUNTER — Encounter: Payer: Medicare Other | Admitting: Internal Medicine

## 2015-04-30 DIAGNOSIS — I70342 Atherosclerosis of unspecified type of bypass graft(s) of the left leg with ulceration of calf: Secondary | ICD-10-CM | POA: Diagnosis not present

## 2015-04-30 DIAGNOSIS — E1140 Type 2 diabetes mellitus with diabetic neuropathy, unspecified: Secondary | ICD-10-CM | POA: Diagnosis not present

## 2015-04-30 DIAGNOSIS — I739 Peripheral vascular disease, unspecified: Secondary | ICD-10-CM | POA: Diagnosis not present

## 2015-04-30 DIAGNOSIS — L97221 Non-pressure chronic ulcer of left calf limited to breakdown of skin: Secondary | ICD-10-CM | POA: Diagnosis not present

## 2015-04-30 DIAGNOSIS — E11622 Type 2 diabetes mellitus with other skin ulcer: Secondary | ICD-10-CM | POA: Diagnosis not present

## 2015-04-30 DIAGNOSIS — I70322 Atherosclerosis of unspecified type of bypass graft(s) of the extremities with rest pain, left leg: Secondary | ICD-10-CM | POA: Diagnosis not present

## 2015-04-30 DIAGNOSIS — I10 Essential (primary) hypertension: Secondary | ICD-10-CM | POA: Diagnosis not present

## 2015-04-30 DIAGNOSIS — M1990 Unspecified osteoarthritis, unspecified site: Secondary | ICD-10-CM | POA: Diagnosis not present

## 2015-04-30 DIAGNOSIS — L97821 Non-pressure chronic ulcer of other part of left lower leg limited to breakdown of skin: Secondary | ICD-10-CM | POA: Diagnosis not present

## 2015-05-01 NOTE — Progress Notes (Signed)
CHAYCE, RULLO ([id number]) Visit Report for 04/30/2015 Chief Complaint Document Details Patient Name: [name], [name] Date of Service: 04/30/2015 1:30 PM Medical Record Patient Account Number: [id number] Number: Treating RN: Clover Mealy, RN, BSN, Rita April 15, 1945 [phone] y.o. Other Clinician: Date of Birth/Sex: Female) Treating Samantha Olivera Primary Care Physician/Extender: Kerry Kass, [facility] Physician: Referring Physician: Sherrie Mustache Weeks in Treatment: 9 Information Obtained from: Patient Chief Complaint Left calf ulcerations. Arterial insufficiency. Electronic Signature(s) Signed: 05/01/2015 8:13:25 AM By: Baltazar Najjar MD Entered By: Baltazar Najjar on 04/30/2015 14:21:13 [place], [name] ([id number]) -------------------------------------------------------------------------------- HPI Details Patient Name: [name] Date of Service: 04/30/2015 1:30 PM Medical Record Patient Account Number: [id number] Number: Treating RN: Clover Mealy RN, BSN, Rita [birth year] [phone] y.o. Other Clinician: Date of Birth/Sex: Female) Treating Scott Vanderveer Primary Care Physician/Extender: Kerry Kass, [facility] Physician: Referring Physician: Sherrie Mustache Weeks in Treatment: 9 History of Present Illness HPI Description: Pleasant 70 year old with diabetes (no hemoglobin A1c available) and severe peripheral vascular disease. She reports a prior left lower extremity stent. Records unavailable. Underwent multiple level angioplasty of left lower extremity 10/29/2014 by Dr. Wyn Quaker. Developed left calf ulcerations in late December 2016. Denies any trauma. Seen by her PCP. Bactrim prescribed. Referred to the wound clinic. Biopsy culture 02/20/2015 grew methicillin sensitive staph aureus, sensitive to Bactrim. Performing dressing changes with Prisma. Using a Tubigrip for edema control. Scheduled to see Dr. Wyn Quaker in follow-up 02/25/2015. Cancelled because of weather. Rescheduled for 03/06/2015. She  returns to clinic for follow-up and is without new complaints. Pain improved. No ischemic rest pain. No fever or chills. Minimal drainage. 03/13/2015 -- records obtained from [facility] shows that she was seen by Dr. dew on 03/12/2015 and given tramadol and other symptomatic treatment. Recent ABI done showed right to be 0.94 and left to be 0.47 a left lower extremity arterial duplex was notable for an occluded stent and distal ATA. Recommendations were for left lower extremity angiogram with intervention. on 03/11/2015 she was taken up for a aortogram with angioplasty of the left posterior tibial artery and tibioperoneal trunk, above-knee popliteal artery and almost entire SFA, stent placement to the SFA and popliteal artery. 03/20/15 her arterial interventions noted. The patient states her left leg feels better. He ischemic wound on her left anterior leg. She has been using Santyl 04/09/15; the patient arrives back today. 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 dew. I was concerned about her last week as she had weeping edema fluid and areas of threatened ulceration around her wound. I ordered a silver alginate, Profore light wrap. She comes back today with an Radio broadcast assistant on apparently applied by Dr. Driscilla Grammes office. Her edema is under much better control and the weeping area here last week surrounding her major wound has resolved. She does have another open area which is small and superficial. I'm not sure I would put an Unna boot on this lady's degree of arterial insufficiency. 04/16/15; her original small wound appears to be healthy with good granulation her measurements are improved. She has a small area underneath this. Both underwent selective debridement since surface slough 04/23/15; the original wound has filled in there is healthy granulation here. The small area underneath this is healed. No debridement was required 04/30/15;  the wound no longer has any depth. Debridement required KENDAL, GHAZARIAN ([id number]) Electronic Signature(s) Signed: 05/01/2015 8:13:25 AM By: Baltazar Najjar MD Entered By: Baltazar Najjar on 04/30/2015 14:22:14 [place], [name] ([id number]) --------------------------------------------------------------------------------  Physical Exam Details Patient Name: [name] Date of Service: 04/30/2015 1:30 PM Medical Record Patient Account Number: [id number] Number: Treating RN: Clover Mealy, RN, BSN, Rita Aug 12, 1945 [phone] y.o. Other Clinician: Date of Birth/Sex: Female) Treating Shada Nienaber Primary Care Physician/Extender: Kerry Kass, [facility] Physician: Referring Physician: Sherrie Mustache Weeks in Treatment: 9 Cardiovascular Pedal pulses palpable and strong bilaterally.. Extremities are free of varicosities, clubbing or edema. Peripheral pulses strong and equal. Capillary refill < 3 seconds.. Notes Wound exam; her edema is under good control with the Profore light wrap wound has healthy granulation rim of epithelialization. Satellite lesion is not open any longer. The wound has no depth Electronic Signature(s) Signed: 05/01/2015 8:13:25 AM By: Baltazar Najjar MD Entered By: Baltazar Najjar on 04/30/2015 14:22:58 GISSELLA, NIBLACK ([id number]) -------------------------------------------------------------------------------- Physician Orders Details Patient Name: [name] Date of Service: 04/30/2015 1:30 PM Medical Record Patient Account Number: [id number] Number: Treating RN: Clover Mealy RN, BSN, Rita 1945-12-30 [phone] y.o. Other Clinician: Date of Birth/Sex: Female) Treating Hodari Chuba Primary Care Physician/Extender: Kerry Kass, [facility] Physician: Referring Physician: Augustin Coupe in Treatment: 9 Verbal / Phone Orders: Yes Clinician: Afful, RN, BSN, Rita Read Back and Verified: Yes Diagnosis Coding Wound Cleansing Wound #2 Left,Medial Lower Leg o Cleanse  wound with mild soap and water o May shower with protection. Anesthetic Wound #2 Left,Medial Lower Leg o Topical Lidocaine 4% cream applied to wound bed prior to debridement Skin Barriers/Peri-Wound Care Wound #2 Left,Medial Lower Leg o Barrier cream Primary Wound Dressing Wound #2 Left,Medial Lower Leg o Prisma Ag Secondary Dressing Wound #2 Left,Medial Lower Leg o ABD pad Dressing Change Frequency Wound #2 Left,Medial Lower Leg o Other: - Change Tuesday and Friday Follow-up Appointments Wound #2 Left,Medial Lower Leg o Return Appointment in 1 week. Edema Control Wound #2 Left,Medial Lower Leg o 3 Layer Compression System - Left Lower Extremity - use unna wrap to anchor [place], Norine N. ([id number]) o Elevate legs to the level of the heart and pump ankles as often as possible Home Health Wound #2 Left,Medial Lower Leg o Continue Home Health Visits o Home Health Nurse may visit PRN to address patientos wound care needs. o FACE TO FACE ENCOUNTER: MEDICARE and MEDICAID PATIENTS: I certify that this patient is under my care and that I had a face-to-face encounter that meets the physician face-to-face encounter requirements with this patient on this date. The encounter with the patient was in whole or in part for the following MEDICAL CONDITION: (primary reason for Home Healthcare) MEDICAL NECESSITY: I certify, that based on my findings, NURSING services are a medically necessary home health service. HOME BOUND STATUS: I certify that my clinical findings support that this patient is homebound (i.e., Due to illness or injury, pt requires aid of supportive devices such as crutches, cane, wheelchairs, walkers, the use of special transportation or the assistance of another person to leave their place of residence. There is a normal inability to leave the home and doing so requires considerable and taxing effort. Other absences are for medical reasons / religious  services and are infrequent or of short duration when for other reasons). o If current dressing causes regression in wound condition, may D/C ordered dressing product/s and apply Normal Saline Moist Dressing daily until next Wound Healing Center / Other MD appointment. Notify Wound Healing Center of regression in wound condition at [phone]. o Please direct any NON-WOUND related issues/requests for orders to patient's Primary Care Physician Electronic Signature(s) Signed: 04/30/2015 3:54:41 PM By:  Elpidio Eric BSN, RN Signed: 05/01/2015 8:13:25 AM By: Baltazar Najjar MD Entered By: Elpidio Eric on 04/30/2015 14:09:28 LESLY, PONTARELLI ([id number]) -------------------------------------------------------------------------------- Problem List Details Patient Name: [name] Date of Service: 04/30/2015 1:30 PM Medical Record Patient Account Number: [id number] Number: Treating RN: Clover Mealy RN, BSN, Rita 08-Nov-1945 [phone] y.o. 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 Other Clinician: Date of Birth/Sex: Female) Treating Nayra Coury Primary Care Physician/Extender: Kerry Kass, [facility] Physician: Referring Physician: Sherrie Mustache Weeks in Treatment: 9 Subjective Chief Complaint Information obtained from Patient Left calf ulcerations. Arterial insufficiency. History of Present Illness (HPI) Pleasant 70 year old with diabetes (no hemoglobin A1c available) and severe peripheral vascular disease. She reports a prior left lower extremity stent. Records unavailable. Underwent multiple level angioplasty of left lower extremity 10/29/2014 by Dr. Wyn Quaker. Developed left calf ulcerations in late December 2016. Denies any trauma. Seen by her PCP. Bactrim prescribed. Referred to the wound clinic. Biopsy culture 02/20/2015 grew methicillin sensitive staph aureus, sensitive to Bactrim. Performing dressing changes with Prisma. Using a Tubigrip for edema control. Scheduled to see Dr. Wyn Quaker in follow-up 02/25/2015. Cancelled because of weather. Rescheduled for 03/06/2015. She returns to clinic for follow-up and is without new complaints. Pain improved. No ischemic rest pain. No fever or chills. Minimal drainage. 03/13/2015 -- records obtained from [facility] shows that she was seen by Dr. dew on 03/12/2015 and given tramadol and other symptomatic treatment. Recent ABI done showed right to be 0.94 and left to be 0.47 a left lower extremity arterial duplex was notable for an occluded stent and distal ATA. Recommendations were for left lower extremity angiogram with intervention. on 03/11/2015 she was taken up for a aortogram with angioplasty of the left posterior tibial artery and tibioperoneal trunk, above-knee popliteal artery and almost entire SFA, stent placement to the SFA and popliteal artery. 03/20/15  her arterial interventions noted. The patient states her left leg feels better. He ischemic wound on her left anterior leg. She has been using Santyl 04/09/15; the patient arrives back today. 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 dew. I was concerned about her last week as she had weeping edema fluid and areas of threatened ulceration around her wound. I ordered a silver alginate, Profore light wrap. She comes back today with an Radio broadcast assistant on apparently applied by Dr. Driscilla Grammes office. Her edema is under much better control and the weeping area here last week surrounding her major wound has resolved. She does have another open area which is small and superficial. I'm not sure I would put an Unna boot on this lady's degree of arterial insufficiency. 04/16/15; her original small wound appears  to be healthy with good granulation her measurements are improved. She has a small area underneath this. Both underwent selective debridement since surface Dusza, Rhiana N. ([id number]) slough 04/23/15; the original wound has filled in there is healthy granulation here. The small area underneath this is healed. No debridement was required 04/30/15; the wound no longer has any depth. Debridement required Objective Constitutional Vitals Time Taken: 1:48 PM, Height: 67 in, Weight: 302 lbs, BMI: 47.3, Temperature: 97.9 F, Pulse: 81 bpm, Respiratory Rate: 18 breaths/min, Blood Pressure: 132/59 mmHg. Cardiovascular Pedal pulses palpable and strong bilaterally.. Extremities are free of varicosities, clubbing or edema. Peripheral pulses strong and equal. Capillary refill < 3 seconds.. General Notes: Wound exam; her edema is under good control with the Profore light wrap wound has healthy granulation rim of epithelialization. Satellite lesion is not open any longer. The wound has no depth Integumentary (Hair, Skin) Wound #2 status is Open. Original cause of wound was  Blister. The wound is located on the Left,Medial Lower Leg. The wound measures 1.3cm length x 1.5cm width x 0.1cm depth; 1.532cm^2 area and 0.153cm^3 volume. The wound is limited to skin breakdown. There is no tunneling or undermining noted. There is a small amount of serosanguineous drainage noted. The wound margin is flat and intact. There is large (67-100%) pink, pale granulation within the wound bed. There is no necrotic tissue within the wound bed. The periwound skin appearance exhibited: Localized Edema, Moist. The periwound skin appearance did not exhibit: Callus, Crepitus, Excoriation, Fluctuance, Friable, Induration, Rash, Scarring, Dry/Scaly, Maceration, Atrophie Blanche, Cyanosis, Ecchymosis, Hemosiderin Staining, Mottled, Pallor, Rubor, Erythema. The periwound has tenderness on palpation. 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 ([id number]) Plan Wound Cleansing: Wound #2 Left,Medial Lower Leg: Cleanse wound with mild soap and water May shower with protection. Anesthetic: Wound #2 Left,Medial Lower Leg: Topical Lidocaine 4% cream applied to wound bed prior to debridement Skin Barriers/Peri-Wound Care: Wound #2 Left,Medial Lower Leg: Barrier cream Primary Wound Dressing: Wound #2 Left,Medial Lower Leg: Prisma Ag Secondary Dressing: Wound #2 Left,Medial Lower Leg: ABD pad Dressing Change Frequency: Wound #2 Left,Medial Lower Leg: Other: - Change Tuesday and Friday Follow-up Appointments: Wound #2 Left,Medial Lower Leg: Return Appointment in 1 week. Edema Control: Wound #2 Left,Medial Lower Leg: 3 Layer Compression System - Left Lower Extremity  - use unna wrap to anchor Elevate legs to the level of the heart and pump ankles as often as possible Home Health: Wound #2 Left,Medial Lower Leg: Continue Home Health Visits Home Health Nurse may visit PRN to address patient s wound care needs. FACE TO FACE ENCOUNTER: MEDICARE and MEDICAID PATIENTS: I certify that this patient is under my care and that I had a face-to-face encounter that meets the physician face-to-face encounter requirements with this patient on this date. The encounter with the patient was in whole or in part for the following MEDICAL CONDITION: (primary reason for Home Healthcare) MEDICAL NECESSITY: I certify, that based on my findings, NURSING services are a medically necessary home health service. HOME BOUND STATUS: I certify that my clinical findings support that this patient is homebound (i.e., Due to illness or injury, pt requires aid of supportive devices such as crutches, cane, wheelchairs, walkers, the  use of special transportation or the assistance of another person to leave their place of residence. There is a normal inability to leave the home and doing so requires considerable and taxing effort. Other absences are for medical reasons / religious services and are infrequent or of short duration when for other reasons). If current dressing causes regression in wound condition, may D/C ordered dressing product/s and apply Normal Saline Moist Dressing daily until next Wound Healing Center / Other MD appointment. Notify Wound Healing Center of regression in wound condition at [phone]. Please direct any NON-WOUND related issues/requests for orders to patient's Primary Care Physician ASHLIEGH, PAREKH ([id number]) #1 we will continue with the Prismadressings with a Profore light wrap ABD pad. #2 patient has home health changing the dressings. Electronic Signature(s) Signed: 05/01/2015 8:13:25 AM By: Baltazar Najjar MD Entered By: Baltazar Najjar on 04/30/2015  14:24:36 [place], [name] ([id number]) -------------------------------------------------------------------------------- SuperBill Details Patient Name: [name] Date of Service: 04/30/2015 Medical Record Patient Account Number: [id number] Number: Treating RN: Clover Mealy RN, BSN, Rita 1946-01-07 [phone] y.o. 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

## 2015-05-01 NOTE — Progress Notes (Signed)
[name], Kayah N. ([id number]) Visit Report for 04/30/2015 Arrival Information Details Patient Name: [name], [name] Date of Service: 04/30/2015 1:30 PM Medical Record Patient Account Number: [id number] Number: Treating RN: Clover MealyAfful, RN, BSN, Rita 02-Feb-1946 [phone](70 y.o. Other Clinician: Date of Birth/Sex: Female) Treating ROBSON, MICHAEL Primary Care Physician: Sherrie MustacheJADALI, FAYEGH Physician/Extender: G Referring Physician: Sherrie MustacheJADALI, FAYEGH Weeks in Treatment: 9 Visit Information History Since Last Visit Added or deleted any medications: No Patient Arrived: Wheel Chair Any new allergies or adverse reactions: No Arrival Time: 13:46 Had a fall or experienced change in No Accompanied By: nephew activities of daily living that may affect Transfer Assistance: None risk of falls: Patient Identification Verified: Yes Signs or symptoms of abuse/neglect since last No Secondary Verification Process Yes visito Completed: Hospitalized since last visit: No Patient Requires Transmission- No Has Dressing in Place as Prescribed: Yes Based Precautions: Pain Present Now: No Patient Has Alerts: Yes Patient Alerts: Patient on Blood Thinner plavix, aspirin 81mg  DMII ABI right 0.94 ABI left 1.16 Electronic Signature(s) Signed: 04/30/2015 3:54:41 PM By: Elpidio EricAfful, Rita BSN, RN Entered By: Elpidio EricAfful, Rita on 04/30/2015 13:47:37 Dimarzo, [name] [name] ([id number]) -------------------------------------------------------------------------------- Encounter Discharge Information Details Patient Name: [name], [name] Date of Service: 04/30/2015 1:30 PM Medical Record Patient Account Number: [id number] Number: Treating RN: Clover MealyAfful, RN, BSN, Rita 02-Feb-1946 [phone](70 y.o. Other Clinician: Date of Birth/Sex: Female) Treating ROBSON, MICHAEL Primary Care Physician: Sherrie MustacheJADALI, FAYEGH Physician/Extender: G Referring Physician: Sherrie MustacheJADALI, FAYEGH Weeks in Treatment: 9 Encounter Discharge Information Items Discharge Pain Level:  0 Discharge Condition: Stable Ambulatory Status: Wheelchair Discharge Destination: Home Transportation: Private Auto Accompanied By: nephew Schedule Follow-up Appointment: No Medication Reconciliation completed and provided to Patient/Care No Glendi Mohiuddin: Provided on Clinical Summary of Care: 04/30/2015 Form Type Recipient Paper Patient MW Electronic Signature(s) Signed: 04/30/2015 3:54:41 PM By: Elpidio EricAfful, Rita BSN, RN Previous Signature: 04/30/2015 2:19:57 PM Version By: Francie MassingKelly, Tia Entered By: Elpidio EricAfful, Rita on 04/30/2015 14:21:55 Philemon, [name] [name] ([id number]) -------------------------------------------------------------------------------- Lower Extremity Assessment Details Patient Name: [name], [name] Date of Service: 04/30/2015 1:30 PM Medical Record Patient Account Number: [id number] Number: Treating RN: Clover MealyAfful, RN, BSN, Rita 02-Feb-1946 [phone](70 y.o. Other Clinician: Date of Birth/Sex: Female) Treating ROBSON, MICHAEL Primary Care Physician: Sherrie MustacheJADALI, FAYEGH Physician/Extender: G Referring Physician: Sherrie MustacheJADALI, FAYEGH Weeks in Treatment: 9 Vascular Assessment Pulses: Posterior Tibial Dorsalis Pedis Palpable: [Left:Yes] Extremity colors, hair growth, and conditions: Extremity Color: [Left:Mottled] Hair Growth on Extremity: [Left:Yes] Temperature of Extremity: [Left:Warm] Capillary Refill: [Left:< 3 seconds] Electronic Signature(s) Signed: 04/30/2015 3:54:41 PM By: Elpidio EricAfful, Rita BSN, RN Entered By: Elpidio EricAfful, Rita on 04/30/2015 13:48:55 [name], [name] [name] ([id number]) -------------------------------------------------------------------------------- Multi Wound Chart Details Patient Name: [name], [name] Date of Service: 04/30/2015 1:30 PM Medical Record Patient Account Number: [id number] Number: Treating RN: Clover MealyAfful, RN, BSN, Rita 02-Feb-1946 [phone](70 y.o. 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] Comorbid History: [2:Hypertension, Peripheral N/A Arterial Disease, Type II Diabetes, Osteoarthritis, Neuropathy] Date Acquired: [2:01/28/2015] [N/A:N/A] Weeks of Treatment: [2:9] [N/A:N/A] Wound Status: [2:Open] [N/A:N/A] Measurements L x W x D 1.3x1.5x0.1 [N/A:N/A] (cm) Area (cm) : [2:1.532] [N/A:N/A] Volume (cm) : [2:0.153] [N/A:N/A] % Reduction in Area: [2:-171.20%] [N/A:N/A] % Reduction in Volume: -  35.40% [N/A:N/A] Classification: [2:Full Thickness Without Exposed Support Structures] [N/A:N/A] HBO Classification: [2:Grade 1] [N/A:N/A] Exudate Amount: [2:Small] [N/A:N/A] Exudate Type: [2:Serosanguineous] [N/A:N/A] Exudate Color: [2:red, brown] [N/A:N/A] Wound Margin: [2:Flat and Intact] [N/A:N/A] Granulation Amount: [2:Large (67-100%)] [N/A:N/A] Granulation Quality: [2:Pink, Pale] [N/A:N/A] Necrotic Amount: [2:None Present (0%)] [N/A:N/A] Exposed Structures: Fascia: No N/A N/A Fat: No Tendon: No Muscle: No Joint: No Bone: No Limited to Skin Breakdown Epithelialization: None N/A N/A Periwound Skin Texture: Edema: Yes N/A N/A Excoriation: No Induration: No Callus: No Crepitus: No Fluctuance: No Friable: No Rash: No Scarring: No Periwound Skin Moist: Yes N/A N/A Moisture: Maceration: No Dry/Scaly: No Periwound Skin Color: Atrophie Blanche: No N/A N/A Cyanosis: No Ecchymosis: No Erythema: No Hemosiderin Staining: No Mottled: No Pallor: No Rubor: No Tenderness on Yes N/A N/A Palpation: Wound Preparation: Ulcer Cleansing: N/A N/A Rinsed/Irrigated  with Saline Topical Anesthetic Applied: Other: lidocaine 4% Treatment Notes Electronic Signature(s) Signed: 04/30/2015 3:54:41 PM By: Elpidio Eric BSN, RN Entered By: Elpidio Eric on 04/30/2015 14:09:07 [name], [name] ([id number]) -------------------------------------------------------------------------------- Multi-Disciplinary Care Plan Details Patient Name: [name] Date of Service: 04/30/2015 1:30 PM Medical Record Patient Account Number: [id number] Number: Treating RN: Clover Mealy, RN, BSN, Rita 01-Feb-1946 [phone] y.o. Other Clinician: Date of Birth/Sex: Female) Treating ROBSON, MICHAEL Primary Care Physician: Sherrie Mustache Physician/Extender: G Referring Physician: Sherrie Mustache Weeks in Treatment: 9 Active Inactive Abuse / Safety / Falls / Self Care Management Nursing Diagnoses: Potential for falls Goals: Patient will remain injury free Date Initiated: 02/20/2015 Goal Status: Active Interventions: Assess fall risk on admission and as needed Notes: Nutrition Nursing Diagnoses: Potential for alteratiion in Nutrition/Potential for imbalanced nutrition Goals: Patient/caregiver will maintain therapeutic glucose control Date Initiated: 02/20/2015 Goal Status: Active Interventions: Assess patient nutrition upon admission and as needed per policy Notes: Orientation to the Wound Care Program Nursing Diagnoses: Knowledge deficit related to the wound healing center program Goals: [name], [name] ([id number]) Patient/caregiver will verbalize understanding of the Wound Healing Center Program Date Initiated: 02/20/2015 Goal Status: Active Interventions: Provide education on orientation to the wound center Notes: Wound/Skin Impairment Nursing Diagnoses: Impaired tissue integrity Goals: Ulcer/skin breakdown will heal within 14 weeks Date Initiated: 02/20/2015 Goal Status: Active Interventions: Assess ulceration(s) every visit Notes: Electronic Signature(s) Signed:  04/30/2015 3:54:41 PM By: Elpidio Eric BSN, RN Entered By: Elpidio Eric on 04/30/2015 14:08:57 Fretz, [name] ([id number]) -------------------------------------------------------------------------------- Pain Assessment Details Patient Name: [name] Date of Service: 04/30/2015 1:30 PM Medical Record Patient Account Number: [id number] Number: Treating RN: Clover Mealy, RN, BSN, Rita April 25, 1945 [phone] y.o. Other Clinician: Date of Birth/Sex: Female) Treating ROBSON, MICHAEL Primary Care Physician: Sherrie Mustache Physician/Extender: G Referring Physician: Sherrie Mustache Weeks in Treatment: 9 Active Problems Location of Pain Severity and Description of Pain Patient Has Paino No Site Locations Pain Management and Medication Current Pain Management: Electronic Signature(s) Signed: 04/30/2015 3:54:41 PM By: Elpidio Eric BSN, RN Entered By: Elpidio Eric on 04/30/2015 13:47:44 Jamar, [name] ([id number]) -------------------------------------------------------------------------------- Patient/Caregiver Education Details Patient Name: [name] Date of Service: 04/30/2015 1:30 PM Medical Record Patient Account Number: [id number] Number: Treating RN: Clover Mealy RN, BSN, Rita 04-08-1945 [phone] y.o. Other Clinician: Date of Birth/Gender: Female) Treating ROBSON, MICHAEL Primary Care Physician: Sherrie Mustache Physician/Extender: G Referring Physician: Augustin Coupe in Treatment: 9 Education Assessment Education Provided To: Patient Education Topics Provided Welcome To The Wound Care Center: Methods: Explain/Verbal Responses: State content correctly Electronic Signature(s) Signed: 04/30/2015 3:54:41 PM By: Elpidio Eric BSN, RN Entered By: Elpidio Eric on  04/30/2015 14:22:03 MANAIA, SAMAD ([id number]) -------------------------------------------------------------------------------- Wound Assessment Details Patient Name: [name], [name] Date of Service: 04/30/2015 1:30  PM Medical Record Patient Account Number: [id number] Number: Treating RN: Clover Mealy, RN, BSN, Rita August 05, 1945 [phone] y.o. Other Clinician: Date of Birth/Sex: Female) Treating ROBSON, MICHAEL Primary Care Physician: Sherrie Mustache Physician/Extender: G Referring Physician: Sherrie Mustache Weeks in Treatment: 9 Wound Status Wound Number: 2 Primary Arterial Insufficiency Ulcer Etiology: Wound Location: Left Lower Leg - Medial Wound Open Wounding Event: Blister Status: Date Acquired: 01/28/2015 Comorbid Hypertension, Peripheral Arterial Weeks Of Treatment: 9 History: Disease, Type II Diabetes, Clustered Wound: No Osteoarthritis, Neuropathy Wound Measurements Length: (cm) 1.3 Width: (cm) 1.5 Depth: (cm) 0.1 Area: (cm) 1.532 Volume: (cm) 0.153 % Reduction in Area: -171.2% % Reduction in Volume: -35.4% Epithelialization: None Tunneling: No Undermining: No Wound Description Full Thickness Without Exposed Classification: Support Structures Diabetic Severity Grade 1 (Wagner): Wound Margin: Flat and Intact Exudate Amount: Small Exudate Type: Serosanguineous Exudate Color: red, brown Foul Odor After Cleansing: No Wound Bed Granulation Amount: Large (67-100%) Exposed Structure Granulation Quality: Pink, Pale Fascia Exposed: No Necrotic Amount: None Present (0%) Fat Layer Exposed: No Tendon Exposed: No Muscle Exposed: No Joint Exposed: No Bone Exposed: No Limited to Skin Breakdown Periwound Skin Texture Texture Color [name], Bret N. ([id number]) No Abnormalities Noted: No No Abnormalities Noted: No Callus: No Atrophie Blanche: No Crepitus: No Cyanosis: No Excoriation: No Ecchymosis: No Fluctuance: No Erythema: No Friable: No Hemosiderin Staining: No Induration: No Mottled: No Localized Edema: Yes Pallor: No Rash: No Rubor: No Scarring: No Temperature / Pain Moisture Tenderness on Palpation: Yes No Abnormalities Noted: No Dry / Scaly:  No Maceration: No Moist: Yes Wound Preparation Ulcer Cleansing: Rinsed/Irrigated with Saline Topical Anesthetic Applied: Other: lidocaine 4%, Treatment Notes Wound #2 (Left, Medial Lower Leg) 1. Cleansed with: Cleanse wound with antibacterial soap and water 3. Peri-wound Care: Barrier cream Moisturizing lotion 4. Dressing Applied: Prisma Ag 5. Secondary Dressing Applied Dry Gauze 7. Secured with 3 Layer Compression System - Left Lower Extremity Electronic Signature(s) Signed: 04/30/2015 3:54:41 PM By: Elpidio Eric BSN, RN Entered By: Elpidio Eric on 04/30/2015 13:56:43 Carnell, [name] ([id number]) -------------------------------------------------------------------------------- Vitals Details Patient Name: [name] Date of Service: 04/30/2015 1:30 PM Medical Record Patient Account Number: [id number] Number: Treating RN: Clover Mealy, RN, BSN, Rita [birth year] [phone] y.o. Other Clinician: Date of Birth/Sex: Female) Treating ROBSON, MICHAEL Primary Care Physician: Sherrie Mustache Physician/Extender: G Referring Physician: Sherrie Mustache Weeks in Treatment: 9 Vital Signs Time Taken: 13:48 Temperature (F): 97.9 Height (in): 67 Pulse (bpm): 81 Weight (lbs): 302 Respiratory Rate (breaths/min): 18 Body Mass Index (BMI): 47.3 Blood Pressure (mmHg): 132/59 Reference Range: 80 - 120 mg / dl Electronic Signature(s) Signed: 04/30/2015 3:54:41 PM By: Elpidio Eric BSN, RN Entered By: Elpidio Eric on 04/30/2015 13:48:34

## 2015-05-03 DIAGNOSIS — L97221 Non-pressure chronic ulcer of left calf limited to breakdown of skin: Secondary | ICD-10-CM | POA: Diagnosis not present

## 2015-05-03 DIAGNOSIS — E1151 Type 2 diabetes mellitus with diabetic peripheral angiopathy without gangrene: Secondary | ICD-10-CM | POA: Diagnosis not present

## 2015-05-03 DIAGNOSIS — Z7984 Long term (current) use of oral hypoglycemic drugs: Secondary | ICD-10-CM | POA: Diagnosis not present

## 2015-05-03 DIAGNOSIS — Z7982 Long term (current) use of aspirin: Secondary | ICD-10-CM | POA: Diagnosis not present

## 2015-05-03 DIAGNOSIS — Z4800 Encounter for change or removal of nonsurgical wound dressing: Secondary | ICD-10-CM | POA: Diagnosis not present

## 2015-05-03 DIAGNOSIS — I70342 Atherosclerosis of unspecified type of bypass graft(s) of the left leg with ulceration of calf: Secondary | ICD-10-CM | POA: Diagnosis not present

## 2015-05-03 DIAGNOSIS — L97229 Non-pressure chronic ulcer of left calf with unspecified severity: Secondary | ICD-10-CM | POA: Diagnosis not present

## 2015-05-03 DIAGNOSIS — Z95828 Presence of other vascular implants and grafts: Secondary | ICD-10-CM | POA: Diagnosis not present

## 2015-05-03 DIAGNOSIS — Z7902 Long term (current) use of antithrombotics/antiplatelets: Secondary | ICD-10-CM | POA: Diagnosis not present

## 2015-05-07 ENCOUNTER — Encounter: Payer: Medicare Other | Admitting: Internal Medicine

## 2015-05-07 DIAGNOSIS — M1990 Unspecified osteoarthritis, unspecified site: Secondary | ICD-10-CM | POA: Diagnosis not present

## 2015-05-07 DIAGNOSIS — I70342 Atherosclerosis of unspecified type of bypass graft(s) of the left leg with ulceration of calf: Secondary | ICD-10-CM | POA: Diagnosis not present

## 2015-05-07 DIAGNOSIS — L97821 Non-pressure chronic ulcer of other part of left lower leg limited to breakdown of skin: Secondary | ICD-10-CM | POA: Diagnosis not present

## 2015-05-07 DIAGNOSIS — E1140 Type 2 diabetes mellitus with diabetic neuropathy, unspecified: Secondary | ICD-10-CM | POA: Diagnosis not present

## 2015-05-07 DIAGNOSIS — I739 Peripheral vascular disease, unspecified: Secondary | ICD-10-CM | POA: Diagnosis not present

## 2015-05-07 DIAGNOSIS — E11622 Type 2 diabetes mellitus with other skin ulcer: Secondary | ICD-10-CM | POA: Diagnosis not present

## 2015-05-07 DIAGNOSIS — I10 Essential (primary) hypertension: Secondary | ICD-10-CM | POA: Diagnosis not present

## 2015-05-07 DIAGNOSIS — L97221 Non-pressure chronic ulcer of left calf limited to breakdown of skin: Secondary | ICD-10-CM | POA: Diagnosis not present

## 2015-05-07 DIAGNOSIS — I70322 Atherosclerosis of unspecified type of bypass graft(s) of the extremities with rest pain, left leg: Secondary | ICD-10-CM | POA: Diagnosis not present

## 2015-05-10 NOTE — Progress Notes (Signed)
[name], [name] ([id number]) Visit Report for 05/07/2015 Chief Complaint Document Details Patient Name: [name], [name] Date of Service: 05/07/2015 1:30 PM Medical Record Patient Account Number: [id number] Number: Treating RN: Clover Mealy, RN, BSN, Rita [birth year] [phone] y.o. Other Clinician: Date of Birth/Sex: Female) Treating Dhanush Jokerst Primary Care Physician/Extender: Kerry Kass, [facility] Physician: Referring Physician: Sherrie Mustache Weeks in Treatment: 10 Information Obtained from: Patient Chief Complaint Left calf ulcerations. Arterial insufficiency. Electronic Signature(s) Signed: 05/08/2015 4:33:06 PM By: Baltazar Najjar MD Entered By: Baltazar Najjar on 05/07/2015 14:20:02 [name], [name] ([id number]) -------------------------------------------------------------------------------- HPI Details Patient Name: [name] Date of Service: 05/07/2015 1:30 PM Medical Record Patient Account Number: [id number] Number: Treating RN: Clover Mealy RN, BSN, Rita 03-21-1945 [phone] y.o. Other Clinician: Date of Birth/Sex: Female) Treating Borden Thune Primary Care Physician/Extender: Kerry Kass, [facility] Physician: Referring Physician: Sherrie Mustache Weeks in Treatment: 10 History of Present Illness HPI Description: Pleasant 70 year old with diabetes (no hemoglobin A1c available) and severe peripheral vascular disease. She reports a prior left lower extremity stent. Records unavailable. Underwent multiple level angioplasty of left lower extremity 10/29/2014 by Dr. Wyn Quaker. Developed left calf ulcerations in late December 2016. Denies any trauma. Seen by her PCP. Bactrim prescribed. Referred to the wound clinic. Biopsy culture 02/20/2015 grew methicillin sensitive staph aureus, sensitive to Bactrim. Performing dressing changes with Prisma. Using a Tubigrip for edema control. Scheduled to see Dr. Wyn Quaker in follow-up 02/25/2015. Cancelled because of weather. Rescheduled for 03/06/2015. She  returns to clinic for follow-up and is without new complaints. Pain improved. No ischemic rest pain. No fever or chills. Minimal drainage. 03/13/2015 -- records obtained from [facility] shows that she was seen by Dr. dew on 03/12/2015 and given tramadol and other symptomatic treatment. Recent ABI done showed right to be 0.94 and left to be 0.47 a left lower extremity arterial duplex was notable for an occluded stent and distal ATA. Recommendations were for left lower extremity angiogram with intervention. on 03/11/2015 she was taken up for a aortogram with angioplasty of the left posterior tibial artery and tibioperoneal trunk, above-knee popliteal artery and almost entire SFA, stent placement to the SFA and popliteal artery. 03/20/15 her arterial interventions noted. The patient states her left leg feels better. He ischemic wound on her left anterior leg. She has been using Santyl 04/09/15; the patient arrives back today. 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 dew. I was concerned about her last week as she had weeping edema fluid and areas of threatened ulceration around her wound. I ordered a silver alginate, Profore light wrap. She comes back today with an Radio broadcast assistant on apparently applied by Dr. Driscilla Grammes office. Her edema is under much better control and the weeping area here last week surrounding her major wound has resolved. She does have another open area which is small and superficial. I'm not sure I would put an Unna boot on this lady's degree of arterial insufficiency. 04/16/15; her original small wound appears to be healthy with good granulation her measurements are improved. She has a small area underneath this. Both underwent selective debridement since surface slough 04/23/15; the original wound has filled in there is healthy granulation here. The small area underneath this is healed. No debridement was required 04/30/15;  the wound no longer has any depth. no Debridement required 05/07/15; the wound appears to be stable. Advancing epithelialization. Decrease wound volume. No Hiemstra, Diara N. ([id number]) debridement is required. She has known arterial disease  status post stent placement to the SFA and popliteal artery. Electronic Signature(s) Signed: 05/08/2015 4:33:06 PM By: Baltazar Najjarobson, Roman Sandall MD Entered By: Baltazar Najjarobson, Wayman Hoard on 05/07/2015 14:21:07 [name] InfanteWOODS, [name] ([id number]) -------------------------------------------------------------------------------- Physical Exam Details Patient Name: [name] InfanteWOODS, [name] Date of Service: 05/07/2015 1:30 PM Medical Record Patient Account Number: [id number] Number: Treating RN: Clover MealyAfful, RN, BSN, Rita 06-Aug-1945 [phone](69 y.o. Other Clinician: Date of Birth/Sex: Female) Treating Aarvi Stotts Primary Care Physician/Extender: Kerry KassG JADALI, [facility] Physician: Referring Physician: Sherrie MustacheJADALI, FAYEGH Weeks in Treatment: 10 Constitutional Sitting or standing Blood Pressure is within target range for patient.. Pulse regular and within target range for patient.Marland Kitchen. Respirations regular, non-labored and within target range.. Temperature is normal and within the target range for the patient.. Patient's appearance is neat and clean. Appears in no acute distress. Well nourished and well developed.. Cardiovascular Femoral arteries without bruits and pulses strong.. Pedal pulses palpable and strong bilaterally.. Lymphatic No adenopathy palpable in the popliteal or femoral area. Integumentary (Hair, Skin) Skin and subcutaneous tissue without rashes, lesions, discoloration or ulcers. No evidence of fungal disease. Hair and skin color texture normal and healthy in appearance.Marland Kitchen. Psychiatric No evidence of depression, anxiety, or agitation. Calm, cooperative, and communicative. Appropriate interactions and affect.. Notes 05/07/15 wound exam her edema is under good control with a Profore light wrap and  she is tolerating this well. Healthy granulation rims of epithelialization. The wound has no depth Electronic Signature(s) Signed: 05/08/2015 4:33:06 PM By: Baltazar Najjarobson, Mamoru Takeshita MD Entered By: Baltazar Najjarobson, Cela Newcom on 05/07/2015 14:22:36 [name], [name] [name] ([id number]) -------------------------------------------------------------------------------- Physician Orders Details Patient Name: [name] InfanteWOODS, [name] Date of Service: 05/07/2015 1:30 PM Medical Record Patient Account Number: [id number] Number: Treating RN: Clover MealyAfful, RN, BSN, Rita 06-Aug-1945 [phone](69 y.o. Other Clinician: Date of Birth/Sex: Female) Treating Emalyn Schou Primary Care Physician/Extender: Kerry KassG JADALI, [facility] Physician: Referring Physician: Augustin CoupeJADALI, FAYEGH Weeks in Treatment: 10 Verbal / Phone Orders: Yes Clinician: Afful, RN, BSN, Rita Read Back and Verified: Yes Diagnosis Coding Wound Cleansing Wound #2 Left,Medial Lower Leg o Cleanse wound with mild soap and water o May shower with protection. Anesthetic Wound #2 Left,Medial Lower Leg o Topical Lidocaine 4% cream applied to wound bed prior to debridement Skin Barriers/Peri-Wound Care Wound #2 Left,Medial Lower Leg o Barrier cream Primary Wound Dressing Wound #2 Left,Medial Lower Leg o Prisma Ag Secondary Dressing Wound #2 Left,Medial Lower Leg o ABD pad Dressing Change Frequency Wound #2 Left,Medial Lower Leg o Other: - Change Tuesday and Friday Follow-up Appointments Wound #2 Left,Medial Lower Leg o Return Appointment in 1 week. Edema Control Wound #2 Left,Medial Lower Leg o 3 Layer Compression System - Left Lower Extremity - use unna wrap to anchor Vanosdol, Gladyes N. ([id number]) o Elevate legs to the level of the heart and pump ankles as often as possible Home Health Wound #2 Left,Medial Lower Leg o Continue Home Health Visits o Home Health Nurse may visit PRN to address patientos wound care needs. o FACE TO FACE ENCOUNTER: MEDICARE and  MEDICAID PATIENTS: I certify that this patient is under my care and that I had a face-to-face encounter that meets the physician face-to-face encounter requirements with this patient on this date. The encounter with the patient was in whole or in part for the following MEDICAL CONDITION: (primary reason for Home Healthcare) MEDICAL NECESSITY: I certify, that based on my findings, NURSING services are a medically necessary home health service. HOME BOUND STATUS: I certify that my clinical findings support that this patient is homebound (i.e., Due to illness or injury, pt  requires aid of supportive devices such as crutches, cane, wheelchairs, walkers, the use of special transportation or the assistance of another person to leave their place of residence. There is a normal inability to leave the home and doing so requires considerable and taxing effort. Other absences are for medical reasons / religious services and are infrequent or of short duration when for other reasons). o If current dressing causes regression in wound condition, may D/C ordered dressing product/s and apply Normal Saline Moist Dressing daily until next Wound Healing Center / Other MD appointment. Notify Wound Healing Center of regression in wound condition at [phone]. o Please direct any NON-WOUND related issues/requests for orders to patient's Primary Care Physician Electronic Signature(s) Signed: 05/07/2015 5:19:34 PM By: Elpidio Eric BSN, RN Signed: 05/08/2015 4:33:06 PM By: Baltazar Najjar MD Entered By: Elpidio Eric on 05/07/2015 14:16:26 [name], [name] ([id number]) -------------------------------------------------------------------------------- Problem List Details Patient Name: [name] Date of Service: 05/07/2015 1:30 PM Medical Record Patient Account Number: [id number] Number: Treating RN: Clover Mealy RN, BSN, Rita [birth year] [phone] y.o. 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 Other Clinician: Date of Birth/Sex: Female) Treating Dimple Bastyr Primary Care Physician/Extender: Kerry Kass, [facility] Physician: Referring Physician: Sherrie Mustache Weeks in Treatment: 10 Subjective Chief Complaint Information obtained from Patient Left calf ulcerations. Arterial insufficiency. History of Present Illness (HPI) Pleasant 70 year old with diabetes (no hemoglobin A1c available) and severe peripheral vascular disease. She reports a prior left lower extremity stent. Records unavailable. Underwent multiple level angioplasty of left lower extremity 10/29/2014 by Dr. Wyn Quaker. Developed left calf ulcerations in late December 2016. Denies any trauma. Seen by her PCP. Bactrim prescribed. Referred to the wound clinic. Biopsy culture 02/20/2015 grew methicillin sensitive staph aureus, sensitive to Bactrim. Performing dressing changes with Prisma.  Using a Tubigrip for edema control. Scheduled to see Dr. Wyn Quaker in follow-up 02/25/2015. Cancelled because of weather. Rescheduled for 03/06/2015. She returns to clinic for follow-up and is without new complaints. Pain improved. No ischemic rest pain. No fever or chills. Minimal drainage. 03/13/2015 -- records obtained from [facility] shows that she was seen by Dr. dew on 03/12/2015 and given tramadol and other symptomatic treatment. Recent ABI done showed right to be 0.94 and left to be 0.47 a left lower extremity arterial duplex was notable for an occluded stent and distal ATA. Recommendations were for left lower extremity angiogram with intervention. on 03/11/2015 she was taken up for a aortogram with angioplasty of the left posterior tibial artery and tibioperoneal trunk, above-knee popliteal artery and almost entire SFA, stent placement to the SFA and popliteal artery. 03/20/15 her arterial interventions noted. The patient states her left leg feels better. He ischemic wound  on her left anterior leg. She has been using Santyl 04/09/15; the patient arrives back today. 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 dew. I was concerned about her last week as she had weeping edema fluid and areas of threatened ulceration around her wound. I ordered a silver alginate, Profore light wrap. She comes back today with an Radio broadcast assistant on apparently applied by Dr. Driscilla Grammes office. Her edema is under much better control and the weeping area here last week surrounding her major wound has resolved. She does have another open area which is small and superficial. I'm not sure I would put an Unna boot on this lady's degree of arterial insufficiency. 04/16/15; her original small wound appears to be healthy with good granulation her measurements are improved. She has a small area underneath this. Both underwent selective debridement since surface [name], [name]  N. ([id number]) slough 04/23/15; the original wound has filled in there is healthy granulation here. The small area underneath this is healed. No debridement was required 04/30/15; the wound no longer has any depth. no Debridement required 05/07/15; the wound appears to be stable. Advancing epithelialization. Decrease wound volume. No debridement is required. She has known arterial disease status post stent placement to the SFA and popliteal artery. Objective Constitutional Sitting or standing Blood Pressure is within target range for patient.. Pulse regular and within target range for patient.Marland Kitchen Respirations regular, non-labored and within target range.. Temperature is normal and within the target range for the patient.. Patient's appearance is neat and clean. Appears in no acute distress. Well nourished and well developed.. Vitals Time Taken: 1:41 PM, Height: 67 in, Weight: 302 lbs, BMI: 47.3, Temperature: 98.1 F, Pulse: 73 bpm, Respiratory Rate: 17 breaths/min, Blood Pressure: 140/60 mmHg. Cardiovascular Femoral arteries without bruits and pulses strong.. Pedal pulses palpable and strong bilaterally.. Lymphatic No adenopathy palpable in the popliteal or femoral area. Psychiatric No evidence of depression, anxiety, or agitation. Calm, cooperative, and communicative. Appropriate interactions and affect.. General Notes: 05/07/15 wound exam her edema is under good control with a Profore light wrap and she is tolerating this well. Healthy granulation rims of epithelialization. The wound has no depth Integumentary (Hair, Skin) Skin and subcutaneous tissue without rashes, lesions, discoloration or ulcers. No evidence of fungal disease. Hair and skin color texture normal and healthy in appearance.. Wound #2 status is Open. Original cause of wound was Blister. The wound is located on the Left,Medial Lower Leg. The wound measures 1cm length x 1.2cm width x 0.1cm depth; 0.942cm^2 area and 0.094cm^3  volume. The wound is limited to skin breakdown. There is no tunneling or undermining noted. There is a small amount of serosanguineous drainage noted. The wound margin is flat and intact. There is large (67-100%) pink, pale granulation within the wound bed. There is no necrotic tissue within the wound bed. The periwound skin appearance exhibited: Localized Edema, Moist. The periwound skin appearance did not exhibit: Callus, Crepitus, Excoriation, Fluctuance, Friable, Induration, Rash, Scarring, Dry/Scaly, Womac, Aylee N. ([id number]) Maceration, Atrophie Blanche, Cyanosis, Ecchymosis, Hemosiderin Staining, Mottled, Pallor, Rubor, Erythema. The periwound has tenderness on palpation. 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 Anesthetic: Wound #2 Left,Medial Lower Leg: Topical Lidocaine 4% cream applied to wound bed prior to debridement Skin Barriers/Peri-Wound Care: Wound #2 Left,Medial Lower Leg: Barrier cream Primary Wound Dressing: Wound #2 Left,Medial Lower Leg: Prisma Ag Secondary Dressing: Wound #2 Left,Medial Lower Leg: ABD pad Dressing Change Frequency: Wound #2 Left,Medial Lower Leg: Other: - Change Tuesday and Friday Follow-up Appointments: Wound #2 Left,Medial Lower Leg: Return Appointment in 1 week. Edema Control: Wound #2 Left,Medial Lower Leg: 3 Layer Compression System - Left Lower Extremity - use unna wrap to anchor Elevate legs to the level of the heart and pump ankles as often as possible Home Health: [name], [name] ([id number]) Wound  #2 Left,Medial Lower Leg: Continue Home Health Visits Home Health Nurse may visit PRN to address patient s wound care needs. FACE TO FACE ENCOUNTER: MEDICARE and MEDICAID PATIENTS: I certify that this patient is under my care and that I had a face-to-face encounter that meets the physician face-to-face encounter requirements with this patient on this date. The encounter with the patient was in whole or in part for the following MEDICAL CONDITION: (primary reason for Home Healthcare) MEDICAL NECESSITY: I certify, that based on my findings, NURSING services are a medically necessary home health service. HOME BOUND STATUS: I certify that my clinical findings support that this patient is homebound (i.e., Due to illness or injury, pt requires aid of supportive devices such as crutches, cane, wheelchairs, walkers, the use of special transportation or the assistance of another person to leave their place of residence. There is a normal inability to leave the home and doing so requires considerable and taxing effort. Other absences are for medical reasons / religious services and are infrequent or of short duration when for other reasons). If current dressing causes regression in wound condition, may D/C ordered dressing product/s and apply Normal Saline Moist Dressing daily until next Wound Healing Center / Other MD appointment. Notify Wound Healing Center of regression in wound condition at [phone]. Please direct any NON-WOUND related issues/requests for orders to patient's Primary Care Physician Wound appears to be progressing nicely. Prisma, ABVD, poor Profore light Electronic Signature(s) Signed: 05/08/2015 4:33:06 PM By: Baltazar Najjar MD Entered By: Baltazar Najjar on 05/07/2015 14:23:10 Tuley, [name] ([id number]) -------------------------------------------------------------------------------- SuperBill Details Patient Name: [name] Date of Service: 05/07/2015 Medical Record  Patient Account Number: [id number] Number: Treating RN: Clover Mealy RN, BSN, Rita 04-16-1945 [phone] y.o. 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

## 2015-05-10 NOTE — Progress Notes (Signed)
EVERLI, ROTHER ([id number]) Visit Report for 05/07/2015 Arrival Information Details Patient Name: [name], [name] Date of Service: 05/07/2015 1:30 PM Medical Record Patient Account Number: [id number] Number: Treating RN: Clover Mealy, RN, BSN, Rita 1946/01/09 [phone] y.o. Other Clinician: Date of Birth/Sex: Female) Treating ROBSON, MICHAEL Primary Care Physician: Sherrie Mustache Physician/Extender: G Referring Physician: Sherrie Mustache Weeks in Treatment: 10 Visit Information History Since Last Visit Added or deleted any medications: No Patient Arrived: Wheel Chair Any new allergies or adverse reactions: No Arrival Time: 13:40 Had a fall or experienced change in No Accompanied By: nephew activities of daily living that may affect Transfer Assistance: None risk of falls: Patient Identification Verified: Yes Signs or symptoms of abuse/neglect since last No Secondary Verification Process Yes visito Completed: Hospitalized since last visit: No Patient Requires Transmission- No Has Dressing in Place as Prescribed: Yes Based Precautions: Has Compression in Place as Prescribed: Yes Patient Has Alerts: Yes Pain Present Now: No Patient Alerts: Patient on Blood Thinner plavix, aspirin  DMII ABI right 0.94 ABI left 1.16 Electronic Signature(s) Signed: 05/07/2015 5:19:34 PM By: Elpidio Eric BSN, RN Entered By: Elpidio Eric on 05/07/2015 13:41:11 Inthavong, [name] ([id number]) -------------------------------------------------------------------------------- Encounter Discharge Information Details Patient Name: [name] Date of Service: 05/07/2015 1:30 PM Medical Record Patient Account Number: [id number] Number: Treating RN: Clover Mealy, RN, BSN, Rita 1945-11-15 [phone] y.o. Other Clinician: Date of Birth/Sex: Female) Treating ROBSON, MICHAEL Primary Care Physician: Sherrie Mustache Physician/Extender: G Referring Physician: Sherrie Mustache Weeks in Treatment: 10 Encounter  Discharge Information Items Discharge Pain Level: 0 Discharge Condition: Stable Ambulatory Status: Wheelchair Discharge Destination: Home Transportation: Private Auto Accompanied By: Orlin Hilding Schedule Follow-up Appointment: No Medication Reconciliation completed and provided to Patient/Care No Daphna Lafuente: Provided on Clinical Summary of Care: 05/07/2015 Form Type Recipient Paper Patient MW Electronic Signature(s) Signed: 05/07/2015 5:19:34 PM By: Elpidio Eric BSN, RN Previous Signature: 05/07/2015 2:26:08 PM Version By: Gwenlyn Perking Entered By: Elpidio Eric on 05/07/2015 14:26:55 Fedder, [name] ([id number]) -------------------------------------------------------------------------------- Lower Extremity Assessment Details Patient Name: [name] Date of Service: 05/07/2015 1:30 PM Medical Record Patient Account Number: [id number] Number: Treating RN: Clover Mealy, RN, BSN, Rita [birth year] [phone] y.o. Other Clinician: Date of Birth/Sex: Female) Treating ROBSON, MICHAEL Primary Care Physician: Sherrie Mustache Physician/Extender: G Referring Physician: Sherrie Mustache Weeks in Treatment: 10 Vascular Assessment Pulses: Posterior Tibial Dorsalis Pedis Palpable: [Left:Yes] Extremity colors, hair growth, and conditions: Extremity Color: [Left:Mottled] Hair Growth on Extremity: [Left:Yes] Temperature of Extremity: [Left:Warm] Capillary Refill: [Left:< 3 seconds] Toe Nail Assessment Left: Right: Thick: Yes Discolored: Yes Deformed: No Improper Length and Hygiene: No Electronic Signature(s) Signed: 05/07/2015 5:19:34 PM By: Elpidio Eric BSN, RN Entered By: Elpidio Eric on 05/07/2015 13:48:00 Housand, [name] ([id number]) -------------------------------------------------------------------------------- Multi Wound Chart Details Patient Name: [name] Date of Service: 05/07/2015 1:30 PM Medical Record Patient Account Number: [id number] Number: Treating RN: Clover Mealy, RN,  BSN, Rita 06-03-1945 [phone] y.o. 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] Comorbid History: [2:Hypertension, Peripheral N/A Arterial Disease, Type II Diabetes, Osteoarthritis, Neuropathy] Date Acquired: [2:01/28/2015] [N/A:N/A] Weeks of Treatment: [2:10] [N/A:N/A] Wound Status: [2:Open] [N/A:N/A] Measurements L x W x D  1x1.2x0.1 [N/A:N/A] (cm) Area (cm) : [2:0.942] [N/A:N/A] Volume (cm) : [2:0.094] [N/A:N/A] % Reduction in Area: [2:-66.70%] [N/A:N/A] % Reduction in Volume: 16.80% [N/A:N/A] Classification: [2:Full Thickness Without Exposed Support Structures] [N/A:N/A] HBO Classification: [2:Grade 1] [N/A:N/A] Exudate Amount: [2:Small] [N/A:N/A] Exudate Type: [2:Serosanguineous] [N/A:N/A] Exudate Color: [2:red, brown] [N/A:N/A] Wound Margin: [2:Flat and Intact] [N/A:N/A] Granulation Amount: [2:Large (67-100%)] [N/A:N/A] Granulation Quality: [2:Pink, Pale] [N/A:N/A] Necrotic Amount: [2:None Present (0%)] [N/A:N/A] Exposed Structures: Fascia: No N/A N/A Fat: No Tendon: No Muscle: No Joint: No Bone: No Limited to Skin Breakdown Epithelialization: None N/A N/A Periwound Skin Texture: Edema: Yes N/A N/A Excoriation: No Induration: No Callus: No Crepitus: No Fluctuance: No Friable: No Rash: No Scarring: No Periwound Skin Moist: Yes N/A N/A Moisture: Maceration: No Dry/Scaly: No Periwound Skin Color: Atrophie Blanche: No N/A N/A Cyanosis: No Ecchymosis: No Erythema:  No Hemosiderin Staining: No Mottled: No Pallor: No Rubor: No Tenderness on Yes N/A N/A Palpation: Wound Preparation: Ulcer Cleansing: N/A N/A Rinsed/Irrigated with Saline Topical Anesthetic Applied: Other: lidocaine 4% Treatment Notes Electronic Signature(s) Signed: 05/07/2015 5:19:34 PM By: Elpidio Eric BSN, RN Entered By: Elpidio Eric on 05/07/2015 14:15:53 Ingwersen, [name] ([id number]) -------------------------------------------------------------------------------- Multi-Disciplinary Care Plan Details Patient Name: [name] Date of Service: 05/07/2015 1:30 PM Medical Record Patient Account Number: [id number] Number: Treating RN: Clover Mealy, RN, BSN, Rita 1945/03/26 [phone] y.o. Other Clinician: Date of Birth/Sex: Female) Treating ROBSON, MICHAEL Primary Care Physician: Sherrie Mustache Physician/Extender: G Referring Physician: Sherrie Mustache Weeks in Treatment: 10 Active Inactive Abuse / Safety / Falls / Self Care Management Nursing Diagnoses: Potential for falls Goals: Patient will remain injury free Date Initiated: 02/20/2015 Goal Status: Active Interventions: Assess fall risk on admission and as needed Notes: Nutrition Nursing Diagnoses: Potential for alteratiion in Nutrition/Potential for imbalanced nutrition Goals: Patient/caregiver will maintain therapeutic glucose control Date Initiated: 02/20/2015 Goal Status: Active Interventions: Assess patient nutrition upon admission and as needed per policy Notes: Orientation to the Wound Care Program Nursing Diagnoses: Knowledge deficit related to the wound healing center program Goals: NYELLIE, YETTER ([id number]) Patient/caregiver will verbalize understanding of the Wound Healing Center Program Date Initiated: 02/20/2015 Goal Status: Active Interventions: Provide education on orientation to the wound center Notes: Wound/Skin Impairment Nursing Diagnoses: Impaired tissue integrity Goals: Ulcer/skin  breakdown will heal within 14 weeks Date Initiated: 02/20/2015 Goal Status: Active Interventions: Assess ulceration(s) every visit Notes: Electronic Signature(s) Signed: 05/07/2015 5:19:34 PM By: Elpidio Eric BSN, RN Entered By: Elpidio Eric on 05/07/2015 13:54:03 Barbeau, [name] ([id number]) -------------------------------------------------------------------------------- Pain Assessment Details Patient Name: [name] Date of Service: 05/07/2015 1:30 PM Medical Record Patient Account Number: [id number] Number: Treating RN: Clover Mealy, RN, BSN, Rita [birth year] [phone] y.o. Other Clinician: Date of Birth/Sex: Female) Treating ROBSON, MICHAEL Primary Care Physician: Sherrie Mustache Physician/Extender: G Referring Physician: Sherrie Mustache Weeks in Treatment: 10 Active Problems Location of Pain Severity and Description of Pain Patient Has Paino No Site Locations Pain Management and Medication Current Pain Management: Electronic Signature(s) Signed: 05/07/2015 5:19:34 PM By: Elpidio Eric BSN, RN Entered By: Elpidio Eric on 05/07/2015 13:41:19 Farquharson, [name] ([id number]) -------------------------------------------------------------------------------- Patient/Caregiver Education Details Patient Name: [name] Date of Service: 05/07/2015 1:30 PM Medical Record Patient Account Number: [id number] Number: Treating RN: Clover Mealy RN, BSN, Rita 11-04-1945 [phone] y.o. Other Clinician: Date of Birth/Gender: Female) Treating ROBSON, MICHAEL Primary Care Physician: Sherrie Mustache Physician/Extender: G Referring Physician: Augustin Coupe in Treatment: 10 Education Assessment Education Provided To: Patient Education Topics Provided Welcome To The Wound Care  Center: Methods: Explain/Verbal Responses: State content correctly Electronic Signature(s) Signed: 05/07/2015 5:19:34 PM By: Elpidio EricAfful, Rita BSN, RN Entered By: Elpidio EricAfful, Rita on 05/07/2015 14:27:10 Rossetti, [name]  ([id number]) -------------------------------------------------------------------------------- Wound Assessment Details Patient Name: [name] InfanteWOODS, Myrna N. Date of Service: 05/07/2015 1:30 PM Medical Record Patient Account Number: [id number] Number: Treating RN: Clover MealyAfful, RN, BSN, Rita Aug 19, 1945 [phone](69 y.o. Other Clinician: Date of Birth/Sex: Female) Treating ROBSON, MICHAEL Primary Care Physician: Sherrie MustacheJADALI, FAYEGH Physician/Extender: G Referring Physician: Sherrie MustacheJADALI, FAYEGH Weeks in Treatment: 10 Wound Status Wound Number: 2 Primary Arterial Insufficiency Ulcer Etiology: Wound Location: Left Lower Leg - Medial Wound Open Wounding Event: Blister Status: Date Acquired: 01/28/2015 Comorbid Hypertension, Peripheral Arterial Weeks Of Treatment: 10 History: Disease, Type II Diabetes, Clustered Wound: No Osteoarthritis, Neuropathy Photos Photo Uploaded By: Elpidio EricAfful, Rita on 05/07/2015 17:19:01 Wound Measurements Length: (cm) 1 Width: (cm) 1.2 Depth: (cm) 0.1 Area: (cm) 0.942 Volume: (cm) 0.094 % Reduction in Area: -66.7% % Reduction in Volume: 16.8% Epithelialization: None Tunneling: No Undermining: No Wound Description Full Thickness Without Exposed Foul Odor Classification: Support Structures Loconte, Aslin N. ([id number]) After Cleansing: No Diabetic Severity Grade 1 (Wagner): Wound Margin: Flat and Intact Exudate Amount: Small Exudate Type: Serosanguineous Exudate Color: red, brown Wound Bed Granulation Amount: Large (67-100%) Exposed Structure Granulation Quality: Pink, Pale Fascia Exposed: No Necrotic Amount: None Present (0%) Fat Layer Exposed: No Tendon Exposed: No Muscle Exposed: No Joint Exposed: No Bone Exposed: No Limited to Skin Breakdown Periwound Skin Texture Texture Color No Abnormalities Noted: No No Abnormalities Noted: No Callus: No Atrophie Blanche: No Crepitus: No Cyanosis: No Excoriation: No Ecchymosis: No Fluctuance: No Erythema:  No Friable: No Hemosiderin Staining: No Induration: No Mottled: No Localized Edema: Yes Pallor: No Rash: No Rubor: No Scarring: No Temperature / Pain Moisture Tenderness on Palpation: Yes No Abnormalities Noted: No Dry / Scaly: No Maceration: No Moist: Yes Wound Preparation Ulcer Cleansing: Rinsed/Irrigated with Saline Topical Anesthetic Applied: Other: lidocaine 4%, Treatment Notes Wound #2 (Left, Medial Lower Leg) 1. Cleansed with: Cleanse wound with antibacterial soap and water 3. Peri-wound Care: Barrier cream 4. Dressing Applied: [name] InfanteWOODS, Indie N. ([id number]) Prisma Ag 5. Secondary Dressing Applied ABD Pad 7. Secured with 3 Layer Compression System - Left Lower Extremity Electronic Signature(s) Signed: 05/07/2015 5:19:34 PM By: Elpidio EricAfful, Rita BSN, RN Entered By: Elpidio EricAfful, Rita on 05/07/2015 13:53:26 Selvy, [name] ([id number]) -------------------------------------------------------------------------------- Vitals Details Patient Name: [name] InfanteWOODS, Lessa N. Date of Service: 05/07/2015 1:30 PM Medical Record Patient Account Number: [id number] Number: Treating RN: Clover MealyAfful, RN, BSN, Rita Aug 19, 1945 [phone](69 y.o. Other Clinician: Date of Birth/Sex: Female) Treating ROBSON, MICHAEL Primary Care Physician: Sherrie MustacheJADALI, FAYEGH Physician/Extender: G Referring Physician: Sherrie MustacheJADALI, FAYEGH Weeks in Treatment: 10 Vital Signs Time Taken: 13:41 Temperature (F): 98.1 Height (in): 67 Pulse (bpm): 73 Weight (lbs): 302 Respiratory Rate (breaths/min): 17 Body Mass Index (BMI): 47.3 Blood Pressure (mmHg): 140/60 Reference Range: 80 - 120 mg / dl Electronic Signature(s) Signed: 05/07/2015 5:19:34 PM By: Elpidio EricAfful, Rita BSN, RN Entered By: Elpidio EricAfful, Rita on 05/07/2015 13:43:53

## 2015-05-14 ENCOUNTER — Encounter: Payer: Medicare Other | Admitting: Internal Medicine

## 2015-05-14 DIAGNOSIS — I739 Peripheral vascular disease, unspecified: Secondary | ICD-10-CM | POA: Diagnosis not present

## 2015-05-14 DIAGNOSIS — E11622 Type 2 diabetes mellitus with other skin ulcer: Secondary | ICD-10-CM | POA: Diagnosis not present

## 2015-05-14 DIAGNOSIS — I10 Essential (primary) hypertension: Secondary | ICD-10-CM | POA: Diagnosis not present

## 2015-05-14 DIAGNOSIS — I70348 Atherosclerosis of unspecified type of bypass graft(s) of the left leg with ulceration of other part of lower leg: Secondary | ICD-10-CM | POA: Diagnosis not present

## 2015-05-14 DIAGNOSIS — I70342 Atherosclerosis of unspecified type of bypass graft(s) of the left leg with ulceration of calf: Secondary | ICD-10-CM | POA: Diagnosis not present

## 2015-05-14 DIAGNOSIS — L97821 Non-pressure chronic ulcer of other part of left lower leg limited to breakdown of skin: Secondary | ICD-10-CM | POA: Diagnosis not present

## 2015-05-14 DIAGNOSIS — E1140 Type 2 diabetes mellitus with diabetic neuropathy, unspecified: Secondary | ICD-10-CM | POA: Diagnosis not present

## 2015-05-14 DIAGNOSIS — M1990 Unspecified osteoarthritis, unspecified site: Secondary | ICD-10-CM | POA: Diagnosis not present

## 2015-05-14 DIAGNOSIS — I70322 Atherosclerosis of unspecified type of bypass graft(s) of the extremities with rest pain, left leg: Secondary | ICD-10-CM | POA: Diagnosis not present

## 2015-05-14 DIAGNOSIS — L97221 Non-pressure chronic ulcer of left calf limited to breakdown of skin: Secondary | ICD-10-CM | POA: Diagnosis not present

## 2015-05-14 NOTE — Progress Notes (Addendum)
[name], Tarena N. ([id number]) Visit Report for 05/14/2015 Arrival Information Details Patient Name: [name], [name] Date of Service: 05/14/2015 2:45 PM Medical Record Patient Account Number: [id number] Number: Treating RN: [name] [birth year] [phone](69 y.o. Other Clinician: Date of Birth/Sex: Female) Treating [name] Primary Care Physician: [name] Physician/Extender: G Referring Physician: Sherrie MustacheJADALI, [name] Weeks in Treatment: 11 Visit Information History Since Last Visit Added or deleted any medications: No Patient Arrived: Wheel Chair Any new allergies or adverse reactions: No Arrival Time: 14:30 Had a fall or experienced change in No Accompanied By: nephew activities of daily living that may affect Transfer Assistance: None risk of falls: Patient Identification Verified: Yes Signs or symptoms of abuse/neglect since last No Secondary Verification Process Yes visito Completed: Hospitalized since last visit: No Patient Requires Transmission- No Has Dressing in Place as Prescribed: Yes Based Precautions: Pain Present Now: No Patient Has Alerts: Yes Patient Alerts: Patient on Blood Thinner plavix, aspirin 81mg  DMII ABI right 0.94 ABI left 1.16 Electronic Signature(s) Signed: 05/14/2015 2:31:11 PM By: [name] BSN, RN Entered By: [name] on 05/14/2015 14:31:11 Clyne, [name] [name] ([id number]) -------------------------------------------------------------------------------- Encounter Discharge Information Details Patient Name: [name], [name] Date of Service: 05/14/2015 2:45 PM Medical Record Patient Account Number: [id number] Number: Treating RN: [name] [birth year] [phone](69 y.o. Other Clinician: Date of Birth/Sex: Female) Treating [name] Primary Care Physician: [name] Physician/Extender: G Referring Physician: Sherrie MustacheJADALI, [name] Weeks in Treatment: 11 Encounter Discharge Information Items Discharge Pain Level:  0 Discharge Condition: Stable Ambulatory Status: Wheelchair Discharge Destination: Home Transportation: Private Auto Accompanied By: nephew Schedule Follow-up Appointment: No Medication Reconciliation completed and provided to Patient/Care No [name]: Provided on Clinical Summary of Care: 05/14/2015 Form Type Recipient Paper Patient MW Electronic Signature(s) Signed: 05/14/2015 5:36:13 PM By: [name] BSN, RN Previous Signature: 05/14/2015 3:13:20 PM Version By: [name] Entered By: [name] on 05/14/2015 15:13:59 Hurlbut, [name] [name] ([id number]) -------------------------------------------------------------------------------- Lower Extremity Assessment Details Patient Name: [name], [name] Date of Service: 05/14/2015 2:45 PM Medical Record Patient Account Number: [id number] Number: Treating RN: [name] [birth year] [phone](69 y.o. Other Clinician: Date of Birth/Sex: Female) Treating [name] Primary Care Physician: [name] Physician/Extender: G Referring Physician: Sherrie MustacheJADALI, [name] Weeks in Treatment: 11 Vascular Assessment Pulses: Posterior Tibial Dorsalis Pedis Palpable: [Left:Yes] Extremity colors, hair growth, and conditions: Extremity Color: [Left:Mottled] Hair Growth on Extremity: [Left:Yes] Temperature of Extremity: [Left:Warm] Capillary Refill: [Left:< 3 seconds] Toe Nail Assessment Left: Right: Thick: Yes Discolored: No Deformed: No Improper Length and Hygiene: No Electronic Signature(s) Signed: 05/14/2015 5:36:13 PM By: [name] BSN, RN Entered By: [name] on 05/14/2015 14:40:27 Willmott, [name] [name] ([id number]) -------------------------------------------------------------------------------- Multi Wound Chart Details Patient Name: [name], [name] Date of Service: 05/14/2015 2:45 PM Medical Record Patient Account Number: [id number] Number: Treating RN: [name] [birth year] [phone](69 y.o. 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] Comorbid History: [2:Hypertension, Peripheral N/A Arterial Disease, Type II Diabetes, Osteoarthritis, Neuropathy] Date Acquired: [2:01/28/2015] [N/A:N/A] Weeks of Treatment: [2:11] [N/A:N/A] Wound Status: [2:Open] [N/A:N/A] Measurements L x W x D 0.9x1.4x0.1 [N/A:N/A] (cm) Area (cm) : [2:0.99] [  N/A:N/A] Volume (cm) : [2:0.099] [N/A:N/A] % Reduction in Area: [2:-75.20%] [N/A:N/A] % Reduction in Volume: 12.40% [N/A:N/A] Classification: [2:Full Thickness Without Exposed Support Structures] [N/A:N/A] HBO Classification: [2:Grade 1] [N/A:N/A] Exudate Amount: [2:Small] [N/A:N/A] Exudate Type: [2:Serosanguineous] [N/A:N/A] Exudate Color: [2:red, brown] [N/A:N/A] Wound Margin: [2:Flat and Intact] [N/A:N/A] Granulation Amount: [2:Large (67-100%)] [N/A:N/A] Granulation Quality: [2:Pink, Pale] [N/A:N/A] Necrotic Amount: [2:None Present (0%)] [N/A:N/A] Exposed Structures: Fascia: No N/A N/A Fat: No Tendon: No Muscle: No Joint: No Bone: No Limited to Skin Breakdown Epithelialization: Medium (34-66%) N/A N/A Periwound Skin Texture: Edema: Yes N/A N/A Excoriation: No Induration: No Callus: No Crepitus: No Fluctuance: No Friable: No Rash: No Scarring: No Periwound Skin Moist: Yes N/A N/A Moisture: Maceration: No Dry/Scaly: No Periwound Skin Color: Atrophie Blanche: No N/A N/A Cyanosis: No Ecchymosis: No Erythema: No Hemosiderin Staining: No Mottled:  No Pallor: No Rubor: No Tenderness on Yes N/A N/A Palpation: Wound Preparation: Ulcer Cleansing: N/A N/A Rinsed/Irrigated with Saline, Other: soap and water Topical Anesthetic Applied: Other: lidocaine 4% Treatment Notes Electronic Signature(s) Signed: 05/14/2015 2:52:50 PM By: [name] Eric BSN, RN Entered By: [name] Eric on 05/14/2015 14:52:49 JONAI, WEYLAND ([id number]) -------------------------------------------------------------------------------- Multi-Disciplinary Care Plan Details Patient Name: [name] Date of Service: 05/14/2015 2:45 PM Medical Record Patient Account Number: [id number] Number: Treating RN: [name] Mealy, RN, BSN, [name] 08/07/1945 [phone] y.o. Other Clinician: Date of Birth/Sex: Female) Treating [name] Primary Care Physician: [name] Mustache Physician/Extender: G Referring Physician: Sherrie Mustache Weeks in Treatment: 11 Active Inactive Abuse / Safety / Falls / Self Care Management Nursing Diagnoses: Potential for falls Goals: Patient will remain injury free Date Initiated: 02/20/2015 Goal Status: Active Interventions: Assess fall risk on admission and as needed Notes: Nutrition Nursing Diagnoses: Potential for alteratiion in Nutrition/Potential for imbalanced nutrition Goals: Patient/caregiver will maintain therapeutic glucose control Date Initiated: 02/20/2015 Goal Status: Active Interventions: Assess patient nutrition upon admission and as needed per policy Notes: Orientation to the Wound Care Program Nursing Diagnoses: Knowledge deficit related to the wound healing center program Goals: KRISTILYN, COLTRANE ([id number]) Patient/caregiver will verbalize understanding of the Wound Healing Center Program Date Initiated: 02/20/2015 Goal Status: Active Interventions: Provide education on orientation to the wound center Notes: Wound/Skin Impairment Nursing Diagnoses: Impaired tissue integrity Goals: Ulcer/skin breakdown will heal  within 14 weeks Date Initiated: 02/20/2015 Goal Status: Active Interventions: Assess ulceration(s) every visit Notes: Electronic Signature(s) Signed: 05/14/2015 2:52:43 PM By: [name] Eric BSN, RN Entered By: [name] Eric on 05/14/2015 14:52:42 Konieczny, [name] Stakes ([id number]) -------------------------------------------------------------------------------- Pain Assessment Details Patient Name: [name] Date of Service: 05/14/2015 2:45 PM Medical Record Patient Account Number: [id number] Number: Treating RN: [name] Mealy, RN, BSN, [name] 04/01/1945 [phone] y.o. Other Clinician: Date of Birth/Sex: Female) Treating [name] Primary Care Physician: [name] Mustache Physician/Extender: G Referring Physician: Sherrie Mustache Weeks in Treatment: 11 Active Problems Location of Pain Severity and Description of Pain Patient Has Paino No Site Locations Pain Management and Medication Current Pain Management: Electronic Signature(s) Signed: 05/14/2015 2:31:17 PM By: [name] Eric BSN, RN Entered By: [name] Eric on 05/14/2015 14:31:17 Kreitz, [name] Stakes ([id number]) -------------------------------------------------------------------------------- Patient/Caregiver Education Details Patient Name: [name] Date of Service: 05/14/2015 2:45 PM Medical Record Patient Account Number: [id number] Number: Treating RN: [name] Mealy RN, BSN, [name] 07/06/1945 [phone] y.o. Other Clinician: Date of Birth/Gender: Female) Treating [name] Primary Care Physician: [name] Mustache Physician/Extender: G Referring Physician: Augustin Coupe in Treatment: 11 Education Assessment Education Provided To: Patient Education Topics Provided Welcome To The Wound Care Center: Methods:  Explain/Verbal Responses: State content correctly Electronic Signature(s) Signed: 05/14/2015 5:36:13 PM By: [name] Eric BSN, RN Entered By: [name] Eric on 05/14/2015 15:14:11 Guile, [name] Stakes  ([id number]) -------------------------------------------------------------------------------- Wound Assessment Details Patient Name: [name] Date of Service: 05/14/2015 2:45 PM Medical Record Patient Account Number: [id number] Number: Treating RN: [name] Mealy, RN, BSN, [name] 04-04-1945 [phone] y.o. Other Clinician: Date of Birth/Sex: Female) Treating [name] Primary Care Physician: [name] Mustache Physician/Extender: G Referring Physician: Sherrie Mustache Weeks in Treatment: 11 Wound Status Wound Number: 2 Primary Arterial Insufficiency Ulcer Etiology: Wound Location: Left Lower Leg - Medial Wound Open Wounding Event: Blister Status: Date Acquired: 01/28/2015 Comorbid Hypertension, Peripheral Arterial Weeks Of Treatment: 11 History: Disease, Type II Diabetes, Clustered Wound: No Osteoarthritis, Neuropathy Photos Photo Uploaded By: [name] Eric on 05/14/2015 17:30:28 Wound Measurements Length: (cm) 0.9 Width: (cm) 1.4 Depth: (cm) 0.1 Area: (cm) 0.99 Volume: (cm) 0.099 % Reduction in Area: -75.2% % Reduction in Volume: 12.4% Epithelialization: Medium (34-66%) Tunneling: No Undermining: No Wound Description Full Thickness Without Exposed Foul Odor Classification: Support Structures Diabetic Severity Grade 1 (Wagner): Wound Margin: Flat and Intact Exudate Amount: Small Exudate Type: Serosanguineous Exudate Color: red, brown Canizares, Zaakirah N. ([id number]) After Cleansing: No Wound Bed Granulation Amount: Large (67-100%) Exposed Structure Granulation Quality: Pink, Pale Fascia Exposed: No Necrotic Amount: None Present (0%) Fat Layer Exposed: No Tendon Exposed: No Muscle Exposed: No Joint Exposed: No Bone Exposed: No Limited to Skin Breakdown Periwound Skin Texture Texture Color No Abnormalities Noted: No No Abnormalities Noted: No Callus: No Atrophie Blanche: No Crepitus: No Cyanosis: No Excoriation: No Ecchymosis: No Fluctuance:  No Erythema: No Friable: No Hemosiderin Staining: No Induration: No Mottled: No Localized Edema: Yes Pallor: No Rash: No Rubor: No Scarring: No Temperature / Pain Moisture Tenderness on Palpation: Yes No Abnormalities Noted: No Dry / Scaly: No Maceration: No Moist: Yes Wound Preparation Ulcer Cleansing: Rinsed/Irrigated with Saline, Other: soap and water, Topical Anesthetic Applied: Other: lidocaine 4%, Treatment Notes Wound #2 (Left, Medial Lower Leg) 1. Cleansed with: Cleanse wound with antibacterial soap and water 3. Peri-wound Care: Barrier cream Moisturizing lotion 4. Dressing Applied: Hydrafera Blue 5. Secondary Dressing Applied Dry Gauze 7. Secured with 3 Layer Compression System - Left Lower Extremity EUGENIE, HAREWOOD ([id number]) Electronic Signature(s) Signed: 05/14/2015 5:36:13 PM By: [name] Eric BSN, RN Entered By: [name] Eric on 05/14/2015 14:48:30 Eberlin, [name] Stakes ([id number]) -------------------------------------------------------------------------------- Vitals Details Patient Name: [name] Date of Service: 05/14/2015 2:45 PM Medical Record Patient Account Number: [id number] Number: Treating RN: [name] Mealy RN, BSN, [name] 12-07-1945 [phone] y.o. Other Clinician: Date of Birth/Sex: Female) Treating [name] Primary Care Physician: [name] Mustache Physician/Extender: G Referring Physician: Sherrie Mustache Weeks in Treatment: 11 Vital Signs Time Taken: 14:43 Temperature (F): 97.5 Height (in): 67 Pulse (bpm): 79 Weight (lbs): 302 Respiratory Rate (breaths/min): 17 Body Mass Index (BMI): 47.3 Blood Pressure (mmHg): 131/65 Reference Range: 80 - 120 mg / dl Electronic Signature(s) Signed: 05/14/2015 5:36:13 PM By: [name] Eric BSN, RN Entered By: [name] Eric on 05/14/2015 14:43:36

## 2015-05-15 NOTE — Progress Notes (Signed)
LEAANN, NEVILS ([id number]) Visit Report for 05/14/2015 Chief Complaint Document Details Patient Name: [name], [name] Date of Service: 05/14/2015 2:45 PM Medical Record Patient Account Number: [id number] Number: Treating RN: Clover Mealy, RN, BSN, Rita [birth year] [phone] y.o. Other Clinician: Date of Birth/Sex: Female) Treating Fiana Gladu Primary Care Physician/Extender: Kerry Kass, [facility] Physician: Referring Physician: Sherrie Mustache Weeks in Treatment: 11 Information Obtained from: Patient Chief Complaint Left calf ulcerations. Arterial insufficiency. Electronic Signature(s) Signed: 05/14/2015 6:33:23 PM By: Baltazar Najjar MD Entered By: Baltazar Najjar on 05/14/2015 16:56:20 Kawa, [name] ([id number]) -------------------------------------------------------------------------------- Debridement Details Patient Name: [name] Date of Service: 05/14/2015 2:45 PM Medical Record Patient Account Number: [id number] Number: Treating RN: Clover Mealy RN, BSN, Rita [birth year] [phone] y.o. Other Clinician: Date of Birth/Sex: Female) Treating Wilmarie Sparlin Primary Care Physician/Extender: Kerry Kass, [facility] Physician: Referring Physician: Sherrie Mustache Weeks in Treatment: 11 Debridement Performed for Wound #2 Left,Medial Lower Leg Assessment: Performed By: Physician Maxwell Caul, MD Debridement: Debridement Pre-procedure Yes Verification/Time Out Taken: Start Time: 14:58 Pain Control: Lidocaine 4% Topical Solution Level: Skin/Subcutaneous Tissue Total Area Debrided (L x 0.9 (cm) x 1.4 (cm) = 1.26 (cm) W): Tissue and other Fibrin/Slough, Subcutaneous material debrided: Instrument: Curette Bleeding: Minimum Hemostasis Achieved: Pressure End Time: 15:02 Procedural Pain: 0 Post Procedural Pain: 0 Response to Treatment: Procedure was tolerated well Post Debridement Measurements of Total Wound Length: (cm) 0.9 Width: (cm) 1.4 Depth: (cm) 0.1 Volume: (cm)  0.099 Post Procedure Diagnosis Same as Pre-procedure Electronic Signature(s) Signed: 05/14/2015 5:36:13 PM By: Elpidio Eric BSN, RN Signed: 05/14/2015 6:33:23 PM By: Baltazar Najjar MD Entered By: Baltazar Najjar on 05/14/2015 16:56:11 Mackel, [name] ([id number]) ALONDRIA, MOUSSEAU ([id number]) -------------------------------------------------------------------------------- HPI Details Patient Name: [name] Date of Service: 05/14/2015 2:45 PM Medical Record Patient Account Number: [id number] Number: Treating RN: Clover Mealy, RN, BSN, Rita Dec 19, 1945 [phone] y.o. Other Clinician: Date of Birth/Sex: Female) Treating Folashade Gamboa Primary Care Physician/Extender: Kerry Kass, [facility] Physician: Referring Physician: Sherrie Mustache Weeks in Treatment: 11 History of Present Illness HPI Description: Pleasant 70 year old with diabetes (no hemoglobin A1c available) and severe peripheral vascular disease. She reports a prior left lower extremity stent. Records unavailable. Underwent multiple level angioplasty of left lower extremity 10/29/2014 by Dr. Wyn Quaker. Developed left calf ulcerations in late December 2016. Denies any trauma. Seen by her PCP. Bactrim prescribed. Referred to the wound clinic. Biopsy culture 02/20/2015 grew methicillin sensitive staph aureus, sensitive to Bactrim. Performing dressing changes with Prisma. Using a Tubigrip for edema control. Scheduled to see Dr. Wyn Quaker in follow-up 02/25/2015. Cancelled because of weather. Rescheduled for 03/06/2015. She returns to clinic for follow-up and is without new complaints. Pain improved. No ischemic rest pain. No fever or chills. Minimal drainage. 03/13/2015 -- records obtained from [facility] shows that she was seen by Dr. dew on 03/12/2015 and given tramadol and other symptomatic treatment. Recent ABI done showed right to be 0.94 and left to be 0.47 a left lower extremity arterial duplex was notable for an occluded stent  and distal ATA. Recommendations were for left lower extremity angiogram with intervention. on 03/11/2015 she was taken up for a aortogram with angioplasty of the left posterior tibial artery and tibioperoneal trunk, above-knee popliteal artery and almost entire SFA, stent placement to the SFA and popliteal artery. 03/20/15 her arterial interventions noted. The patient states her left leg feels better. He ischemic wound on her left anterior leg. She has been using Santyl 04/09/15; the patient arrives back today. 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 dew. I was concerned about her last week as she had weeping edema fluid and areas of threatened ulceration around her wound. I ordered a silver alginate, Profore light wrap. She comes back today with an Radio broadcast assistant on apparently applied by Dr. Driscilla Grammes office. Her edema is under much better control and the weeping area here last week surrounding her major wound has resolved. She does have another open area which is small and superficial. I'm not sure I would put an Unna boot on this lady's degree of arterial insufficiency. 04/16/15; her original small wound appears to be healthy with good granulation her measurements are improved. She has a small area underneath this. Both underwent selective debridement since surface slough 04/23/15; the original wound has filled in there is healthy granulation here. The small area underneath this is healed. No debridement was required 04/30/15; the wound no longer has any depth. no Debridement required 05/07/15; the wound appears to be stable. Advancing epithelialization. Decrease wound volume. No Trusty, Daylynn N. ([id number]) debridement is required. She has known arterial disease status post stent placement to the SFA and popliteal artery. 05/14/15 unfortunately the wound is not really decreased much this week she still has a rim of epithelialization. The wound bed looks  stable nevertheless I did a surgical debridement and there is some adherent surface slough. She has known PAD however the blood supply appears to be adequate status post stent to the SFA and popliteal and popliteal artery Electronic Signature(s) Signed: 05/14/2015 6:33:23 PM By: Baltazar Najjar MD Entered By: Baltazar Najjar on 05/14/2015 16:58:31 Rasp, [name] ([id number]) -------------------------------------------------------------------------------- Physical Exam Details Patient Name: [name] Date of Service: 05/14/2015 2:45 PM Medical Record Patient Account Number: [id number] Number: Treating RN: Clover Mealy RN, BSN, Rita Jul 06, 1945 [phone] y.o. Other Clinician: Date of Birth/Sex: Female) Treating Phenix Vandermeulen Primary Care Physician/Extender: Kerry Kass, [facility] Physician: Referring Physician: Sherrie Mustache Weeks in Treatment: 11 Notes Wound exam; her edema appears to be under adequate control with a Profore light the wound itself does not look to be that unhealthy but I am disappointed that is not producing more and volume she does have rims of epithelialization and it present has no depth. There is no evidence of infection or ischemia. Electronic Signature(s) Signed: 05/14/2015 6:33:23 PM By: Baltazar Najjar MD Entered By: Baltazar Najjar on 05/14/2015 17:00:12 Dominy, [name] ([id number]) -------------------------------------------------------------------------------- Physician Orders Details Patient Name: [name] Date of Service: 05/14/2015 2:45 PM Medical Record Patient Account Number: [id number] Number: Treating RN: Clover Mealy RN, BSN, Rita February 12, 1946 [phone] y.o. Other Clinician: Date of Birth/Sex: Female) Treating Paxon Propes Primary Care Physician/Extender: Kerry Kass, [facility] Physician: Referring Physician: Augustin Coupe in Treatment: 84 Verbal / Phone Orders: Yes Clinician: Afful, RN, BSN, Rita Read Back and Verified: Yes Diagnosis  Coding Wound Cleansing Wound #2 Left,Medial Lower Leg o Cleanse wound with mild soap and water o May shower with protection. Anesthetic Wound #2 Left,Medial Lower Leg o Topical Lidocaine 4% cream applied to wound bed prior to debridement Skin Barriers/Peri-Wound Care Wound #2 Left,Medial Lower Leg o Barrier cream Primary Wound Dressing Wound #2 Left,Medial Lower Leg o Hydrafera Blue Secondary Dressing Wound #2 Left,Medial Lower Leg o ABD pad Dressing Change Frequency Wound #2 Left,Medial Lower Leg o Other: - Change Tuesday and Friday Follow-up Appointments Wound #2 Left,Medial Lower Leg o Return Appointment in 1 week. Edema Control Wound #2 Left,Medial Lower Leg o 3 Layer Compression  System - Left Lower Extremity - use unna wrap to anchor Danielski, Walida N. ([id number]) o Elevate legs to the level of the heart and pump ankles as often as possible Home Health Wound #2 Left,Medial Lower Leg o Continue Home Health Visits o Home Health Nurse may visit PRN to address patientos wound care needs. o FACE TO FACE ENCOUNTER: MEDICARE and MEDICAID PATIENTS: I certify that this patient is under my care and that I had a face-to-face encounter that meets the physician face-to-face encounter requirements with this patient on this date. The encounter with the patient was in whole or in part for the following MEDICAL CONDITION: (primary reason for Home Healthcare) MEDICAL NECESSITY: I certify, that based on my findings, NURSING services are a medically necessary home health service. HOME BOUND STATUS: I certify that my clinical findings support that this patient is homebound (i.e., Due to illness or injury, pt requires aid of supportive devices such as crutches, cane, wheelchairs, walkers, the use of special transportation or the assistance of another person to leave their place of residence. There is a normal inability to leave the home and doing so requires considerable  and taxing effort. Other absences are for medical reasons / religious services and are infrequent or of short duration when for other reasons). o If current dressing causes regression in wound condition, may D/C ordered dressing product/s and apply Normal Saline Moist Dressing daily until next Wound Healing Center / Other MD appointment. Notify Wound Healing Center of regression in wound condition at [phone]. o Please direct any NON-WOUND related issues/requests for orders to patient's Primary Care Physician Electronic Signature(s) Signed: 05/14/2015 5:36:13 PM By: Elpidio Eric BSN, RN Signed: 05/14/2015 6:33:23 PM By: Baltazar Najjar MD Entered By: Elpidio Eric on 05/14/2015 15:03:15 WESSIE, SHANKS ([id number]) -------------------------------------------------------------------------------- Problem List Details Patient Name: [name] Date of Service: 05/14/2015 2:45 PM Medical Record Patient Account Number: [id number] Number: Treating RN: Clover Mealy RN, BSN, Rita 1945-07-13 [phone] y.o. 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 Other Clinician: Date of Birth/Sex: Female) Treating Harshini Trent Primary Care Physician/Extender: Kerry Kass, [facility] Physician: Referring Physician: Sherrie Mustache Weeks in Treatment: 11 Subjective Chief Complaint Information obtained from Patient Left calf ulcerations. Arterial insufficiency. History of Present Illness (HPI) Pleasant 70 year old with diabetes (no hemoglobin A1c available) and severe peripheral vascular disease. She reports a prior left lower extremity stent. Records unavailable. Underwent multiple level angioplasty of left lower extremity 10/29/2014 by Dr. Wyn Quaker. Developed left calf ulcerations in late December 2016. Denies any trauma. Seen by her PCP. Bactrim prescribed. Referred to the wound clinic. Biopsy culture 02/20/2015 grew methicillin sensitive staph aureus, sensitive to Bactrim. Performing dressing changes with Prisma. Using a Tubigrip  for edema control. Scheduled to see Dr. Wyn Quakerew in follow-up 02/25/2015. Cancelled because of weather. Rescheduled for 03/06/2015. She returns to clinic for follow-up and is without new complaints. Pain improved. No ischemic rest pain. No fever or chills. Minimal drainage. 03/13/2015 -- records obtained from [facility] shows that she was seen by Dr. dew on 03/12/2015 and given tramadol and other symptomatic treatment. Recent ABI done showed right to be 0.94 and left to be 0.47 a left lower extremity arterial duplex was notable for an occluded stent and distal ATA. Recommendations were for left lower extremity angiogram with intervention. on 03/11/2015 she was taken up for a aortogram with angioplasty of the left  posterior tibial artery and tibioperoneal trunk, above-knee popliteal artery and almost entire SFA, stent placement to the SFA and popliteal artery. 03/20/15 her arterial interventions noted. The patient states her left leg feels better. He ischemic wound on her left anterior leg. She has been using Santyl 04/09/15; the patient arrives back today. 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 dew. I was concerned about her last week as she had weeping edema fluid and areas of threatened ulceration around her wound. I ordered a silver alginate, Profore light wrap. She comes back today with an Radio broadcast assistantUnna boot on apparently applied by Dr. Driscilla Grammesdew's office. Her edema is under much better control and the weeping area here last week surrounding her major wound has resolved. She does have another open area which is small and superficial. I'm not sure I would put an Unna boot on this lady's degree of arterial insufficiency. 04/16/15; her original small wound appears to be healthy with good granulation her measurements are improved. She has a small area underneath this. Both underwent selective debridement since surface Liverman, Earlisha N. ([id number]) slough 04/23/15; the original wound has filled in there is healthy granulation here. The small area underneath this is healed. No debridement was required 04/30/15; the wound no longer has any depth. no Debridement required 05/07/15; the wound appears to be stable. Advancing epithelialization. Decrease wound volume. No debridement is required. She has known arterial disease status post stent placement to the SFA and popliteal artery. 05/14/15 unfortunately the wound is not really decreased much this week she still has a rim of epithelialization. The wound bed looks stable nevertheless I did a surgical debridement and there is some adherent surface slough. She has known PAD however the blood supply appears to be adequate  status post stent to the SFA and popliteal and popliteal artery Objective Constitutional Vitals Time Taken: 2:43 PM, Height: 67 in, Weight: 302 lbs, BMI: 47.3, Temperature: 97.5 F, Pulse: 79 bpm, Respiratory Rate: 17 breaths/min, Blood Pressure: 131/65 mmHg. Integumentary (Hair, Skin) Wound #2 status is Open. Original cause of wound was Blister. The wound is located on the Left,Medial Lower Leg. The wound measures 0.9cm length x 1.4cm width x 0.1cm depth; 0.99cm^2 area and 0.099cm^3 volume. The wound is limited to skin breakdown. There is no tunneling or undermining noted. There is a small amount of serosanguineous drainage noted. The wound margin is flat and intact. There is large (67-100%) pink, pale granulation within the wound bed. There is no necrotic tissue within the wound bed. The periwound skin appearance exhibited: Localized Edema, Moist. The periwound skin appearance did not exhibit: Callus, Crepitus, Excoriation, Fluctuance, Friable, Induration, Rash, Scarring, Dry/Scaly, Maceration, Atrophie Blanche, Cyanosis, Ecchymosis, Hemosiderin Staining, Mottled, Pallor, Rubor, Erythema. The periwound has tenderness on palpation. 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 ([id number]) Procedures Wound #2 Wound #2 is an Arterial Insufficiency Ulcer located on the Left,Medial Lower Leg . There was a Skin/Subcutaneous Tissue Debridement ([id number]) debridement with total area of 1.26 sq cm performed by Maxwell Caul, MD. with the following instrument(s): Curette including Fibrin/Slough and Subcutaneous after achieving pain control using Lidocaine 4% Topical  Solution. A time out was conducted prior to the start of the procedure. A Minimum amount of bleeding was controlled with Pressure. The procedure was tolerated well with a pain level of 0 throughout and a pain level of 0 following the procedure. Post Debridement Measurements: 0.9cm length x 1.4cm width x 0.1cm depth; 0.099cm^3 volume. Post procedure Diagnosis Wound #2: Same as Pre-Procedure Plan Wound Cleansing: Wound #2 Left,Medial Lower Leg: Cleanse wound with mild soap and water May shower with protection. Anesthetic: Wound #2 Left,Medial Lower Leg: Topical Lidocaine 4% cream applied to wound bed prior to debridement Skin Barriers/Peri-Wound Care: Wound #2 Left,Medial Lower Leg: Barrier cream Primary Wound Dressing: Wound #2 Left,Medial Lower Leg: Hydrafera Blue Secondary Dressing: Wound #2 Left,Medial Lower Leg: ABD pad Dressing Change Frequency: Wound #2 Left,Medial Lower Leg: Other: - Change Tuesday and Friday Follow-up Appointments: Wound #2 Left,Medial Lower Leg: Return Appointment in 1 week. Edema Control: Wound #2 Left,Medial Lower Leg: 3 Layer Compression System - Left Lower Extremity - use unna wrap to anchor Elevate legs to the level of the heart and pump ankles as often as possible Home Health: TERRIKA, ZUVER ([id number]) Wound #2 Left,Medial Lower Leg: Continue Home Health Visits Home Health Nurse may visit PRN to address patient s wound care needs. FACE TO FACE ENCOUNTER: MEDICARE and MEDICAID PATIENTS: I certify that this patient is under my care and that I had a face-to-face encounter that meets the physician face-to-face encounter requirements with this patient on this date. The encounter with the patient was in whole or in part for the following MEDICAL CONDITION: (primary reason for Home Healthcare) MEDICAL NECESSITY: I certify, that based on my findings, NURSING services are a medically necessary home health service. HOME BOUND STATUS: I certify that my  clinical findings support that this patient is homebound (i.e., Due to illness or injury, pt requires aid of supportive devices such as crutches, cane, wheelchairs, walkers, the use of special transportation or the assistance of another person to leave their place of residence. There is a normal inability to leave the home and doing so requires considerable and taxing effort. Other absences are for medical reasons / religious services and are infrequent or of short duration when for other reasons). If current dressing causes regression in wound condition, may D/C ordered dressing product/s and apply Normal Saline Moist Dressing daily until next Wound Healing Center / Other MD appointment. Notify Wound Healing Center of regression in wound condition at [phone]. Please direct any NON-WOUND related issues/requests for orders to patient's Primary Care Physician 1 after debridement today I have elected to change her dressing the Hydrofera Blue to see if we can stimulate some further epithelialization. We will continue with a Profore light wrap which seems to be doing a decent job controlling her edema Electronic Signature(s) Signed: 05/14/2015 6:33:23 PM By: Baltazar Najjar MD Entered By: Baltazar Najjar  on 05/14/2015 17:01:04 MARSHA, GUNDLACH ([id number]) -------------------------------------------------------------------------------- SuperBill Details Patient Name: [name] Date of Service: 05/14/2015 Medical Record Patient Account Number: [id number] Number: Treating RN: Clover Mealy, RN, BSN, Rita 19-Nov-1945 [phone] y.o. 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 E11.622 Type 2 diabetes mellitus with other skin ulcer Atherosclerosis of unspecified type of bypass graft(s) of the extremities with rest pain, left I70.322 leg Facility Procedures CPT4: Description Modifier Quantity Code [id number] 11042 - DEB SUBQ TISSUE 20 SQ CM/< 25 1 ICD-10 Description Diagnosis 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

## 2015-05-17 DIAGNOSIS — Z4800 Encounter for change or removal of nonsurgical wound dressing: Secondary | ICD-10-CM | POA: Diagnosis not present

## 2015-05-17 DIAGNOSIS — L97229 Non-pressure chronic ulcer of left calf with unspecified severity: Secondary | ICD-10-CM | POA: Diagnosis not present

## 2015-05-17 DIAGNOSIS — E1151 Type 2 diabetes mellitus with diabetic peripheral angiopathy without gangrene: Secondary | ICD-10-CM | POA: Diagnosis not present

## 2015-05-17 DIAGNOSIS — Z95820 Peripheral vascular angioplasty status with implants and grafts: Secondary | ICD-10-CM | POA: Diagnosis not present

## 2015-05-17 DIAGNOSIS — Z7984 Long term (current) use of oral hypoglycemic drugs: Secondary | ICD-10-CM | POA: Diagnosis not present

## 2015-05-17 DIAGNOSIS — Z7902 Long term (current) use of antithrombotics/antiplatelets: Secondary | ICD-10-CM | POA: Diagnosis not present

## 2015-05-17 DIAGNOSIS — I70342 Atherosclerosis of unspecified type of bypass graft(s) of the left leg with ulceration of calf: Secondary | ICD-10-CM | POA: Diagnosis not present

## 2015-05-17 DIAGNOSIS — Z7982 Long term (current) use of aspirin: Secondary | ICD-10-CM | POA: Diagnosis not present

## 2015-05-21 ENCOUNTER — Encounter: Payer: Medicare Other | Attending: Internal Medicine | Admitting: Internal Medicine

## 2015-05-21 DIAGNOSIS — I70322 Atherosclerosis of unspecified type of bypass graft(s) of the extremities with rest pain, left leg: Secondary | ICD-10-CM | POA: Diagnosis not present

## 2015-05-21 DIAGNOSIS — M1990 Unspecified osteoarthritis, unspecified site: Secondary | ICD-10-CM | POA: Insufficient documentation

## 2015-05-21 DIAGNOSIS — E1165 Type 2 diabetes mellitus with hyperglycemia: Secondary | ICD-10-CM | POA: Diagnosis not present

## 2015-05-21 DIAGNOSIS — L97221 Non-pressure chronic ulcer of left calf limited to breakdown of skin: Secondary | ICD-10-CM | POA: Insufficient documentation

## 2015-05-21 DIAGNOSIS — N289 Disorder of kidney and ureter, unspecified: Secondary | ICD-10-CM | POA: Diagnosis not present

## 2015-05-21 DIAGNOSIS — I70248 Atherosclerosis of native arteries of left leg with ulceration of other part of lower left leg: Secondary | ICD-10-CM | POA: Diagnosis not present

## 2015-05-21 DIAGNOSIS — E559 Vitamin D deficiency, unspecified: Secondary | ICD-10-CM | POA: Diagnosis not present

## 2015-05-21 DIAGNOSIS — I70342 Atherosclerosis of unspecified type of bypass graft(s) of the left leg with ulceration of calf: Secondary | ICD-10-CM | POA: Insufficient documentation

## 2015-05-21 DIAGNOSIS — D509 Iron deficiency anemia, unspecified: Secondary | ICD-10-CM | POA: Diagnosis not present

## 2015-05-21 DIAGNOSIS — I739 Peripheral vascular disease, unspecified: Secondary | ICD-10-CM | POA: Insufficient documentation

## 2015-05-21 DIAGNOSIS — E11622 Type 2 diabetes mellitus with other skin ulcer: Secondary | ICD-10-CM | POA: Insufficient documentation

## 2015-05-21 DIAGNOSIS — I158 Other secondary hypertension: Secondary | ICD-10-CM | POA: Diagnosis not present

## 2015-05-21 DIAGNOSIS — I10 Essential (primary) hypertension: Secondary | ICD-10-CM | POA: Diagnosis not present

## 2015-05-21 DIAGNOSIS — E1140 Type 2 diabetes mellitus with diabetic neuropathy, unspecified: Secondary | ICD-10-CM | POA: Insufficient documentation

## 2015-05-21 DIAGNOSIS — L97821 Non-pressure chronic ulcer of other part of left lower leg limited to breakdown of skin: Secondary | ICD-10-CM | POA: Diagnosis not present

## 2015-05-22 NOTE — Progress Notes (Signed)
[name] InfanteWOODS, Whittany N. ([id number]) Visit Report for 05/21/2015 Arrival Information Details Patient Name: [name] InfanteWOODS, Sadie N. Date of Service: 05/21/2015 2:15 PM Medical Record Patient Account Number: [id number] Number: Treating RN: Clover MealyAfful, RN, BSN, Rita Dec 06, 1945 [phone](69 y.o. Other Clinician: Date of Birth/Sex: Female) Treating ROBSON, MICHAEL Primary Care Physician: Sherrie MustacheJADALI, FAYEGH Physician/Extender: G Referring Physician: Sherrie MustacheJADALI, FAYEGH Weeks in Treatment: 12 Visit Information History Since Last Visit Added or deleted any medications: No Patient Arrived: Wheel Chair Any new allergies or adverse reactions: No Arrival Time: 14:21 Had a fall or experienced change in No Accompanied By: nephew activities of daily living that may affect Transfer Assistance: None risk of falls: Patient Identification Verified: Yes Signs or symptoms of abuse/neglect since last No Secondary Verification Process Yes visito Completed: Hospitalized since last visit: No Patient Requires Transmission- No Has Dressing in Place as Prescribed: Yes Based Precautions: Pain Present Now: No Patient Has Alerts: Yes Patient Alerts: Patient on Blood Thinner plavix, aspirin 81mg  DMII ABI right 0.94 ABI left 1.16 Electronic Signature(s) Signed: 05/21/2015 4:43:54 PM By: Elpidio EricAfful, Rita BSN, RN Entered By: Elpidio EricAfful, Rita on 05/21/2015 14:23:02 [name] InfanteWOODS, Matrice N. ([id number]) -------------------------------------------------------------------------------- Clinic Level of Care Assessment Details Patient Name: [name] InfanteWOODS, Lillyen N. Date of Service: 05/21/2015 2:15 PM Medical Record Patient Account Number: [id number] Number: Treating RN: Clover MealyAfful, RN, BSN, Rita Dec 06, 1945 [phone](69 y.o. Other Clinician: Date of Birth/Sex: Female) Treating ROBSON, MICHAEL Primary Care Physician: Sherrie MustacheJADALI, FAYEGH Physician/Extender: G Referring Physician: Sherrie MustacheJADALI, FAYEGH Weeks in Treatment: 12 Clinic Level of Care Assessment Items TOOL 4 Quantity Score []   - Use when only an EandM is performed on FOLLOW-UP visit 0 ASSESSMENTS - Nursing Assessment / Reassessment []  - Reassessment of Co-morbidities (includes updates in patient status) 0 []  - Reassessment of Adherence to Treatment Plan 0 ASSESSMENTS - Wound and Skin Assessment / Reassessment X - Simple Wound Assessment / Reassessment - one wound 1 5 []  - Complex Wound Assessment / Reassessment - multiple wounds 0 []  - Dermatologic / Skin Assessment (not related to wound area) 0 ASSESSMENTS - Focused Assessment []  - Circumferential Edema Measurements - multi extremities 0 []  - Nutritional Assessment / Counseling / Intervention 0 X - Lower Extremity Assessment (monofilament, tuning fork, pulses) 1 5 []  - Peripheral Arterial Disease Assessment (using hand held doppler) 0 ASSESSMENTS - Ostomy and/or Continence Assessment and Care []  - Incontinence Assessment and Management 0 []  - Ostomy Care Assessment and Management (repouching, etc.) 0 PROCESS - Coordination of Care X - Simple Patient / Family Education for ongoing care 1 15 []  - Complex (extensive) Patient / Family Education for ongoing care 0 []  - Staff obtains ChiropractorConsents, Records, Test Results / Process Orders 0 X - Staff telephones HHA, Nursing Homes / Clarify orders / etc 1 10 Depasquale, Keyshawna N. ([id number]) []  - Routine Transfer to another Facility (non-emergent condition) 0 []  - Routine Hospital Admission (non-emergent condition) 0 []  - New Admissions / Manufacturing engineernsurance Authorizations / Ordering NPWT, Apligraf, etc. 0 []  - Emergency Hospital Admission (emergent condition) 0 []  - Simple Discharge Coordination 0 []  - Complex (extensive) Discharge Coordination 0 PROCESS - Special Needs []  - Pediatric / Minor Patient Management 0 []  - Isolation Patient Management 0 []  - Hearing / Language / Visual special needs 0 []  - Assessment of Community assistance (transportation, D/C planning, etc.) 0 []  - Additional assistance / Altered mentation 0 []  -  Support Surface(s) Assessment (bed, cushion, seat, etc.) 0 INTERVENTIONS - Wound Cleansing / Measurement X - Simple Wound Cleansing - one wound 1  5  - Complex Wound Cleansing - multiple wounds 0 X - Wound Imaging (photographs - any number of wounds) 1 5  - Wound Tracing (instead of photographs) 0 X - Simple Wound Measurement - one wound 1 5  - Complex Wound Measurement - multiple wounds 0 INTERVENTIONS - Wound Dressings X - Small Wound Dressing one or multiple wounds 1 10  - Medium Wound Dressing one or multiple wounds 0  - Large Wound Dressing one or multiple wounds 0  - Application of Medications - topical 0  - Application of Medications - injection 0 Freid, Darely N. ([id number]) INTERVENTIONS - Miscellaneous  - External ear exam 0  - Specimen Collection (cultures, biopsies, blood, body fluids, etc.) 0  - Specimen(s) / Culture(s) sent or taken to Lab for analysis 0  - Patient Transfer (multiple staff / Nurse, adult / Similar devices) 0  - Simple Staple / Suture removal (25 or less) 0  - Complex Staple / Suture removal (26 or more) 0  - Hypo / Hyperglycemic Management (close monitor of Blood Glucose) 0  - Ankle / Brachial Index (ABI) - do not check if billed separately 0 X - Vital Signs 1 5 Has the patient been seen at the hospital within the last three years: Yes Total Score: 65 Level Of Care: New/Established - Level 2 Electronic Signature(s) Signed: 05/21/2015 2:54:54 PM By: Elpidio Eric BSN, RN Entered By: Elpidio Eric on 05/21/2015 14:54:54 Gayden, [name] ([id number]) -------------------------------------------------------------------------------- Encounter Discharge Information Details Patient Name: [name] Date of Service: 05/21/2015 2:15 PM Medical Record Patient Account Number: [id number] Number: Treating RN: Clover Mealy, RN, BSN, Rita Dec 26, 1945 [phone] y.o. Other Clinician: Date of Birth/Sex: Female) Treating ROBSON, MICHAEL Primary Care  Physician: Sherrie Mustache Physician/Extender: G Referring Physician: Sherrie Mustache Weeks in Treatment: 12 Encounter Discharge Information Items Discharge Pain Level: 0 Discharge Condition: Stable Ambulatory Status: Wheelchair Discharge Destination: Home Transportation: Private Auto Accompanied By: nephew Schedule Follow-up Appointment: No Medication Reconciliation completed No and provided to Patient/Care Naome Brigandi: Provided on Clinical Summary of Care: 05/21/2015 Form Type Recipient Paper Patient MW Electronic Signature(s) Signed: 05/21/2015 2:57:00 PM By: Elpidio Eric BSN, RN Previous Signature: 05/21/2015 2:49:57 PM Version By: Gwenlyn Perking Entered By: Elpidio Eric on 05/21/2015 14:56:59 Satcher, [name] ([id number]) -------------------------------------------------------------------------------- Lower Extremity Assessment Details Patient Name: [name] Date of Service: 05/21/2015 2:15 PM Medical Record Patient Account Number: [id number] Number: Treating RN: Clover Mealy, RN, BSN, Rita 1945-12-13 [phone] y.o. Other Clinician: Date of Birth/Sex: Female) Treating ROBSON, MICHAEL Primary Care Physician: Sherrie Mustache Physician/Extender: G Referring Physician: Sherrie Mustache Weeks in Treatment: 12 Vascular Assessment Pulses: Posterior Tibial Dorsalis Pedis Palpable: [Left:Yes] Extremity colors, hair growth, and conditions: Extremity Color: [Left:Normal] Hair Growth on Extremity: [Left:Yes] Temperature of Extremity: [Left:Warm] Capillary Refill: [Left:< 3 seconds] Toe Nail Assessment Left: Right: Thick: Yes Discolored: Yes Deformed: No Improper Length and Hygiene: No Electronic Signature(s) Signed: 05/21/2015 4:43:54 PM By: Elpidio Eric BSN, RN Entered By: Elpidio Eric on 05/21/2015 14:27:09 [name] ([id number]) -------------------------------------------------------------------------------- Multi Wound Chart Details Patient Name: [name] Date of Service:  05/21/2015 2:15 PM Medical Record Patient Account Number: [id number] Number: Treating RN: Clover Mealy, RN, BSN, Rita 11-Nov-1945 [phone] y.o. 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] Comorbid History: [2:Hypertension, Peripheral  N/A Arterial Disease, Type II Diabetes, Osteoarthritis, Neuropathy] Date Acquired: [2:01/28/2015] [N/A:N/A] Weeks of Treatment: [2:12] [N/A:N/A] Wound Status: [2:Open] [N/A:N/A] Measurements L x W x D 1x1.3x0.1 [N/A:N/A] (cm) Area (cm) : [2:1.021] [N/A:N/A] Volume (cm) : [2:0.102] [N/A:N/A] % Reduction in Area: [2:-80.70%] [N/A:N/A] % Reduction in Volume: 9.70% [N/A:N/A] Classification: [2:Full Thickness Without Exposed Support Structures] [N/A:N/A] HBO Classification: [2:Grade 1] [N/A:N/A] Exudate Amount: [2:Small] [N/A:N/A] Exudate Type: [2:Serosanguineous] [N/A:N/A] Exudate Color: [2:red, brown] [N/A:N/A] Wound Margin: [2:Flat and Intact] [N/A:N/A] Granulation Amount: [2:Large (67-100%)] [N/A:N/A] Granulation Quality: [2:Pink, Pale] [N/A:N/A] Necrotic Amount: [2:None Present (0%)] [N/A:N/A] Exposed Structures: [2:Fascia: No Fat: No Tendon: No Muscle: No Joint: No Bone: No Limited to Skin Breakdown] [N/A:N/A] Epithelialization: Medium (34-66%) N/A N/A Periwound Skin Texture: Edema: Yes N/A N/A Excoriation: No Induration: No Callus: No Crepitus: No Fluctuance: No Friable: No Rash: No Scarring: No Periwound Skin Moist: Yes N/A N/A Moisture: Maceration: No Dry/Scaly: No Periwound Skin Color: Atrophie Blanche: No N/A N/A Cyanosis: No Ecchymosis: No Erythema: No Hemosiderin Staining: No Mottled: No Pallor: No Rubor:  No Tenderness on Yes N/A N/A Palpation: Wound Preparation: Ulcer Cleansing: N/A N/A Rinsed/Irrigated with Saline, Other: soap and water Topical Anesthetic Applied: Other: lidocaine 4% Treatment Notes Electronic Signature(s) Signed: 05/21/2015 4:43:54 PM By: Elpidio Eric BSN, RN Entered By: Elpidio Eric on 05/21/2015 14:31:36 Nydam, [name] ([id number]) -------------------------------------------------------------------------------- Multi-Disciplinary Care Plan Details Patient Name: [name] Date of Service: 05/21/2015 2:15 PM Medical Record Patient Account Number: [id number] Number: Treating RN: Clover Mealy, RN, BSN, Rita [birth year] [phone] y.o. Other Clinician: Date of Birth/Sex: Female) Treating ROBSON, MICHAEL Primary Care Physician: Sherrie Mustache Physician/Extender: G Referring Physician: Sherrie Mustache Weeks in Treatment: 12 Active Inactive Abuse / Safety / Falls / Self Care Management Nursing Diagnoses: Potential for falls Goals: Patient will remain injury free Date Initiated: 02/20/2015 Goal Status: Active Interventions: Assess fall risk on admission and as needed Notes: Nutrition Nursing Diagnoses: Potential for alteratiion in Nutrition/Potential for imbalanced nutrition Goals: Patient/caregiver will maintain therapeutic glucose control Date Initiated: 02/20/2015 Goal Status: Active Interventions: Assess patient nutrition upon admission and as needed per policy Notes: Orientation to the Wound Care Program Nursing Diagnoses: Knowledge deficit related to the wound healing center program Goals: MAKAYA, JUNEAU ([id number]) Patient/caregiver will verbalize understanding of the Wound Healing Center Program Date Initiated: 02/20/2015 Goal Status: Active Interventions: Provide education on orientation to the wound center Notes: Wound/Skin Impairment Nursing Diagnoses: Impaired tissue integrity Goals: Ulcer/skin breakdown will heal within 14 weeks Date  Initiated: 02/20/2015 Goal Status: Active Interventions: Assess ulceration(s) every visit Notes: Electronic Signature(s) Signed: 05/21/2015 4:43:54 PM By: Elpidio Eric BSN, RN Entered By: Elpidio Eric on 05/21/2015 14:31:28 Blount, [name] ([id number]) -------------------------------------------------------------------------------- Pain Assessment Details Patient Name: [name] Date of Service: 05/21/2015 2:15 PM Medical Record Patient Account Number: [id number] Number: Treating RN: Clover Mealy, RN, BSN, Rita 02/19/1945 [phone] y.o. Other Clinician: Date of Birth/Sex: Female) Treating ROBSON, MICHAEL Primary Care Physician: Sherrie Mustache Physician/Extender: G Referring Physician: Sherrie Mustache Weeks in Treatment: 12 Active Problems Location of Pain Severity and Description of Pain Patient Has Paino No Site Locations Pain Management and Medication Current Pain Management: Electronic Signature(s) Signed: 05/21/2015 4:43:54 PM By: Elpidio Eric BSN, RN Entered By: Elpidio Eric on 05/21/2015 14:23:08 [name] ([id number]) -------------------------------------------------------------------------------- Patient/Caregiver Education Details Patient Name: [name] Date of Service: 05/21/2015 2:15 PM Medical Record Patient Account Number: [id number] Number: Treating RN: Clover Mealy RN, BSN, Rita 1945-05-30 [phone] y.o. Other Clinician: Date of Birth/Gender: Female) Treating  Baltazar Najjar Primary Care Physician: Sherrie Mustache Physician/Extender: G Referring Physician: Augustin Coupe in Treatment: 12 Education Assessment Education Provided To: Patient Education Topics Provided Welcome To The Wound Care Center: Methods: Explain/Verbal Responses: State content correctly Electronic Signature(s) Signed: 05/21/2015 2:57:20 PM By: Elpidio Eric BSN, RN Entered By: Elpidio Eric on 05/21/2015 14:57:20 Osika, [name]  ([id number]) -------------------------------------------------------------------------------- Wound Assessment Details Patient Name: [name] Date of Service: 05/21/2015 2:15 PM Medical Record Patient Account Number: [id number] Number: Treating RN: Clover Mealy RN, BSN, Rita 04-12-1945 [phone] y.o. Other Clinician: Date of Birth/Sex: Female) Treating ROBSON, MICHAEL Primary Care Physician: Sherrie Mustache Physician/Extender: G Referring Physician: Sherrie Mustache Weeks in Treatment: 12 Wound Status Wound Number: 2 Primary Arterial Insufficiency Ulcer Etiology: Wound Location: Left Lower Leg - Medial Wound Open Wounding Event: Blister Status: Date Acquired: 01/28/2015 Comorbid Hypertension, Peripheral Arterial Weeks Of Treatment: 12 History: Disease, Type II Diabetes, Clustered Wound: No Osteoarthritis, Neuropathy Photos Photo Uploaded By: Elpidio Eric on 05/21/2015 16:50:14 Wound Measurements Length: (cm) 1 Width: (cm) 1.3 Depth: (cm) 0.1 Area: (cm) 1.021 Volume: (cm) 0.102 % Reduction in Area: -80.7% % Reduction in Volume: 9.7% Epithelialization: Medium (34-66%) Tunneling: No Undermining: No Wound Description Full Thickness Without Exposed Foul Odor Classification: Support Structures Coldren, Josee N. ([id number]) After Cleansing: No Diabetic Severity Grade 1 (Wagner): Wound Margin: Flat and Intact Exudate Amount: Small Exudate Type: Serosanguineous Exudate Color: red, brown Wound Bed Granulation Amount: Large (67-100%) Exposed Structure Granulation Quality: Pink, Pale Fascia Exposed: No Necrotic Amount: None Present (0%) Fat Layer Exposed: No Tendon Exposed: No Muscle Exposed: No Joint Exposed: No Bone Exposed: No Limited to Skin Breakdown Periwound Skin Texture Texture Color No Abnormalities Noted: No No Abnormalities Noted: No Callus: No Atrophie Blanche: No Crepitus: No Cyanosis: No Excoriation: No Ecchymosis: No Fluctuance: No Erythema:  No Friable: No Hemosiderin Staining: No Induration: No Mottled: No Localized Edema: Yes Pallor: No Rash: No Rubor: No Scarring: No Temperature / Pain Moisture Tenderness on Palpation: Yes No Abnormalities Noted: No Dry / Scaly: No Maceration: No Moist: Yes Wound Preparation Ulcer Cleansing: Rinsed/Irrigated with Saline, Other: soap and water, Topical Anesthetic Applied: Other: lidocaine 4%, Treatment Notes Wound #2 (Left, Medial Lower Leg) 1. Cleansed with: Cleanse wound with antibacterial soap and water 3. Peri-wound Care: Moisturizing lotion 4. Dressing Applied: BILLIEJO, SORTO ([id number]) Iodosorb Ointment 5. Secondary Dressing Applied ABD Pad 7. Secured with 3 Layer Compression System - Left Lower Extremity Electronic Signature(s) Signed: 05/21/2015 4:43:54 PM By: Elpidio Eric BSN, RN Entered By: Elpidio Eric on 05/21/2015 14:31:20

## 2015-05-22 NOTE — Progress Notes (Signed)
[name], [name] ([id number]) Visit Report for 05/21/2015 Chief Complaint Document Details Patient Name: [name], [name] Date of Service: 05/21/2015 2:15 PM Medical Record Patient Account Number: [id number] Number: Treating RN: Clover Mealy, RN, BSN, Rita [birth year] [phone] y.o. Other Clinician: Date of Birth/Sex: Female) Treating ROBSON, MICHAEL Primary Care Physician/Extender: Kerry Kass, [facility] Physician: Referring Physician: Sherrie Mustache Weeks in Treatment: 12 Information Obtained from: Patient Chief Complaint Left calf ulcerations. Arterial insufficiency. Electronic Signature(s) Signed: 05/22/2015 7:58:31 AM By: Baltazar Najjar MD Entered By: Baltazar Najjar on 05/21/2015 15:18:35 [name], [name] ([id number]) -------------------------------------------------------------------------------- HPI Details Patient Name: [name] Date of Service: 05/21/2015 2:15 PM Medical Record Patient Account Number: [id number] Number: Treating RN: Clover Mealy RN, BSN, Rita January 17, 1946 [phone] y.o. Other Clinician: Date of Birth/Sex: Female) Treating ROBSON, MICHAEL Primary Care Physician/Extender: Kerry Kass, [facility] Physician: Referring Physician: Sherrie Mustache Weeks in Treatment: 12 History of Present Illness HPI Description: Pleasant 70 year old with diabetes (no hemoglobin A1c available) and severe peripheral vascular disease. She reports a prior left lower extremity stent. Records unavailable. Underwent multiple level angioplasty of left lower extremity 10/29/2014 by Dr. Wyn Quaker. Developed left calf ulcerations in late December 2016. Denies any trauma. Seen by her PCP. Bactrim prescribed. Referred to the wound clinic. Biopsy culture 02/20/2015 grew methicillin sensitive staph aureus, sensitive to Bactrim. Performing dressing changes with Prisma. Using a Tubigrip for edema control. Scheduled to see Dr. Wyn Quaker in follow-up 02/25/2015. Cancelled because of weather. Rescheduled for 03/06/2015. She  returns to clinic for follow-up and is without new complaints. Pain improved. No ischemic rest pain. No fever or chills. Minimal drainage. 03/13/2015 -- records obtained from [facility] shows that she was seen by Dr. dew on 03/12/2015 and given tramadol and other symptomatic treatment. Recent ABI done showed right to be 0.94 and left to be 0.47 a left lower extremity arterial duplex was notable for an occluded stent and distal ATA. Recommendations were for left lower extremity angiogram with intervention. on 03/11/2015 she was taken up for a aortogram with angioplasty of the left posterior tibial artery and tibioperoneal trunk, above-knee popliteal artery and almost entire SFA, stent placement to the SFA and popliteal artery. 03/20/15 her arterial interventions noted. The patient states her left leg feels better. He ischemic wound on her left anterior leg. She has been using Santyl 04/09/15; the patient arrives back today. 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 dew. I was concerned about her last week as she had weeping edema fluid and areas of threatened ulceration around her wound. I ordered a silver alginate, Profore light wrap. She comes back today with an Radio broadcast assistant on apparently applied by Dr. Driscilla Grammes office. Her edema is under much better control and the weeping area here last week surrounding her major wound has resolved. She does have another open area which is small and superficial. I'm not sure I would put an Unna boot on this lady's degree of arterial insufficiency. 04/16/15; her original small wound appears to be healthy with good granulation her measurements are improved. She has a small area underneath this. Both underwent selective debridement since surface slough 04/23/15; the original wound has filled in there is healthy granulation here. The small area underneath this is healed. No debridement was required 04/30/15;  the wound no longer has any depth. no Debridement required 05/07/15; the wound appears to be stable. Advancing epithelialization. Decrease wound volume. No [name], [name] ([id number]) debridement is required. She has known arterial disease  status post stent placement to the SFA and popliteal artery. 05/14/15 unfortunately the wound is not really decreased much this week she still has a rim of epithelialization. The wound bed looks stable nevertheless I did a surgical debridement and there is some adherent surface slough. She has known PAD however the blood supply appears to be adequate status post stent to the SFA and popliteal and popliteal artery 05/21/15 wound is healthier than last week. No debridement is necessary. Known PAD status post stent to the SFA and popliteal artery Electronic Signature(s) Signed: 05/22/2015 7:58:31 AM By: Baltazar Najjar MD Entered By: Baltazar Najjar on 05/21/2015 15:19:21 [name], [name] -------------------------------------------------------------------------------- Physical Exam Details Patient Name: [name] Date of Service: 05/21/2015 2:15 PM Medical Record Patient Account Number: [id number] Number: Treating RN: Clover Mealy RN, BSN, Rita 04/08/1945 [phone] y.o. Other Clinician: Date of Birth/Sex: Female) Treating ROBSON, MICHAEL Primary Care Physician/Extender: Kerry Kass, [facility] Physician: Referring Physician: Sherrie Mustache Weeks in Treatment: 12 Notes Wound exam; her edema appears to be adequately controlled and circulation also appears adequate. There is no evidence of infection. There is no depth to this wound and some epithelialization. No debridement was required Electronic Signature(s) Signed: 05/22/2015 7:58:31 AM By: Baltazar Najjar MD Entered By: Baltazar Najjar on 05/21/2015 15:20:01 [name], [name] ([id number]) -------------------------------------------------------------------------------- Physician Orders Details Patient  Name: [name] Date of Service: 05/21/2015 2:15 PM Medical Record Patient Account Number: [id number] Number: Treating RN: Clover Mealy RN, BSN, Rita May 05, 1945 [phone] y.o. Other Clinician: Date of Birth/Sex: Female) Treating ROBSON, MICHAEL Primary Care Physician/Extender: Kerry Kass, [facility] Physician: Referring Physician: Augustin Coupe in Treatment: 79 Verbal / Phone Orders: Yes Clinician: Afful, RN, BSN, Rita Read Back and Verified: Yes Diagnosis Coding Wound Cleansing Wound #2 Left,Medial Lower Leg o Cleanse wound with mild soap and water o May shower with protection. Anesthetic Wound #2 Left,Medial Lower Leg o Topical Lidocaine 4% cream applied to wound bed prior to debridement Skin Barriers/Peri-Wound Care Wound #2 Left,Medial Lower Leg o Barrier cream Primary Wound Dressing Wound #2 Left,Medial Lower Leg o Iodoflex Secondary Dressing Wound #2 Left,Medial Lower Leg o ABD pad Dressing Change Frequency Wound #2 Left,Medial Lower Leg o Other: - Change Tuesday and Friday Follow-up Appointments Wound #2 Left,Medial Lower Leg o Return Appointment in 1 week. Edema Control Wound #2 Left,Medial Lower Leg o 3 Layer Compression System - Left Lower Extremity - use unna wrap to anchor Crago, Vastie N. ([id number]) o Elevate legs to the level of the heart and pump ankles as often as possible Home Health Wound #2 Left,Medial Lower Leg o Continue Home Health Visits o Home Health Nurse may visit PRN to address patientos wound care needs. o FACE TO FACE ENCOUNTER: MEDICARE and MEDICAID PATIENTS: I certify that this patient is under my care and that I had a face-to-face encounter that meets the physician face-to-face encounter requirements with this patient on this date. The encounter with the patient was in whole or in part for the following MEDICAL CONDITION: (primary reason for Home Healthcare) MEDICAL NECESSITY: I certify, that based on my  findings, NURSING services are a medically necessary home health service. HOME BOUND STATUS: I certify that my clinical findings support that this patient is homebound (i.e., Due to illness or injury, pt requires aid of supportive devices such as crutches, cane, wheelchairs, walkers, the use of special transportation or the assistance of another person to leave their place of residence. There is a normal inability to leave the home and  doing so requires considerable and taxing effort. Other absences are for medical reasons / religious services and are infrequent or of short duration when for other reasons). o If current dressing causes regression in wound condition, may D/C ordered dressing product/s and apply Normal Saline Moist Dressing daily until next Wound Healing Center / Other MD appointment. Notify Wound Healing Center of regression in wound condition at [phone]. o Please direct any NON-WOUND related issues/requests for orders to patient's Primary Care Physician Electronic Signature(s) Signed: 05/21/2015 2:39:44 PM By: Elpidio EricAfful, Rita BSN, RN Signed: 05/22/2015 7:58:31 AM By: Baltazar Najjarobson, Michael MD Entered By: Elpidio EricAfful, Rita on 05/21/2015 14:39:43 Bouwens, [name] [name] ([id number]) -------------------------------------------------------------------------------- Problem List Details Patient Name: [name] InfanteWOODS, Nevia N. Date of Service: 05/21/2015 2:15 PM Medical Record Patient Account Number: [id number] Number: Treating RN: Clover MealyAfful, RN, BSN, Rita Mar 25, 1945 [phone](69 y.o. Other Clinician: Date of Birth/Sex: Female) Treating ROBSON, MICHAEL Primary Care Physician/Extender: Kerry KassG [facility]ADAL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 ([id number]) -------------------------------------------------------------------------------- Progress Note Details Patient Name: [name] InfanteWOODS, Kimiya N. Date of Service: 05/21/2015 2:15 PM Medical Record Patient Account Number: [id number] Number: Treating RN: Clover MealyAfful, RN, BSN, Rita Mar 25, 1945 [phone](69 y.o. Other Clinician: Date of Birth/Sex: Female) Treating ROBSON, MICHAEL Primary Care Physician/Extender: Kerry KassG [facility]ADAL[facility], [facility] Physician: Referring Physician: Sherrie MustacheJADALI, FAYEGH Weeks in Treatment: 12 Subjective Chief Complaint Information obtained from Patient Left calf ulcerations. Arterial insufficiency. History of Present Illness (HPI) Pleasant 70 year old with diabetes (no hemoglobin A1c available) and severe peripheral vascular disease. She reports a prior left lower extremity stent. Records unavailable. Underwent multiple level angioplasty of left lower extremity 10/29/2014 by Dr. Wyn Quakerew. Developed left calf ulcerations in late December 2016. Denies any trauma. Seen by her PCP. Bactrim prescribed. Referred to the wound clinic. Biopsy culture 02/20/2015 grew methicillin sensitive staph aureus, sensitive to Bactrim. Performing dressing changes with Prisma. Using a Tubigrip for edema control. Scheduled to see Dr. Wyn Quakerew in follow-up 02/25/2015. Cancelled because of weather. Rescheduled for 03/06/2015. She returns to clinic for follow-up and is without new complaints. Pain improved. No ischemic rest pain. No fever or chills. Minimal drainage. 03/13/2015 -- records obtained from [facility] shows that she was seen by  Dr. dew on 03/12/2015 and given tramadol and other symptomatic treatment. Recent ABI done showed right to be 0.94 and left to be 0.47 a left lower extremity arterial duplex was notable for an occluded stent and distal ATA. Recommendations were for left lower extremity angiogram with intervention. on 03/11/2015 she was taken up for a aortogram with angioplasty of the left posterior tibial artery and tibioperoneal trunk, above-knee popliteal artery and almost entire SFA, stent placement to the SFA and popliteal artery. 03/20/15 her arterial interventions noted. The patient states her left leg feels better. He ischemic wound on her left anterior leg. She has been using Santyl 04/09/15; the patient arrives back today. 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 dew. I was concerned about her last week as  she had weeping edema fluid and areas of threatened ulceration around her wound. I ordered a silver alginate, Profore light wrap. She comes back today with an Radio broadcast assistant on apparently applied by Dr. Driscilla Grammes office. Her edema is under much better control and the weeping area here last week surrounding her major wound has resolved. She does have another open area which is small and superficial. I'm not sure I would put an Unna boot on this lady's degree of arterial insufficiency. 04/16/15; her original small wound appears to be healthy with good granulation her measurements are improved. She has a small area underneath this. Both underwent selective debridement since surface Pescador, Shamone N. ([id number]) slough 04/23/15; the original wound has filled in there is healthy granulation here. The small area underneath this is healed. No debridement was required 04/30/15; the wound no longer has any depth. no Debridement required 05/07/15; the wound appears to be stable. Advancing epithelialization. Decrease wound volume. No debridement is required. She has known  arterial disease status post stent placement to the SFA and popliteal artery. 05/14/15 unfortunately the wound is not really decreased much this week she still has a rim of epithelialization. The wound bed looks stable nevertheless I did a surgical debridement and there is some adherent surface slough. She has known PAD however the blood supply appears to be adequate status post stent to the SFA and popliteal and popliteal artery 05/21/15 wound is healthier than last week. No debridement is necessary. Known PAD status post stent to the SFA and popliteal artery Objective Integumentary (Hair, Skin) Wound #2 status is Open. Original cause of wound was Blister. The wound is located on the Left,Medial Lower Leg. The wound measures 1cm length x 1.3cm width x 0.1cm depth; 1.021cm^2 area and 0.102cm^3 volume. The wound is limited to skin breakdown. There is no tunneling or undermining noted. There is a small amount of serosanguineous drainage noted. The wound margin is flat and intact. There is large (67-100%) pink, pale granulation within the wound bed. There is no necrotic tissue within the wound bed. The periwound skin appearance exhibited: Localized Edema, Moist. The periwound skin appearance did not exhibit: Callus, Crepitus, Excoriation, Fluctuance, Friable, Induration, Rash, Scarring, Dry/Scaly, Maceration, Atrophie Blanche, Cyanosis, Ecchymosis, Hemosiderin Staining, Mottled, Pallor, Rubor, Erythema. The periwound has tenderness on palpation. 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 ([id number]) Plan Wound Cleansing: Wound #2 Left,Medial Lower Leg: Cleanse wound with  mild soap and water May shower with protection. Anesthetic: Wound #2 Left,Medial Lower Leg: Topical Lidocaine 4% cream applied to wound bed prior to debridement Skin Barriers/Peri-Wound Care: Wound #2 Left,Medial Lower Leg: Barrier cream Primary Wound Dressing: Wound #2 Left,Medial Lower Leg: Iodoflex Secondary Dressing: Wound #2 Left,Medial Lower Leg: ABD pad Dressing Change Frequency: Wound #2 Left,Medial Lower Leg: Other: - Change Tuesday and Friday Follow-up Appointments: Wound #2 Left,Medial Lower Leg: Return Appointment in 1 week. Edema Control: Wound #2 Left,Medial Lower Leg: 3 Layer Compression System - Left Lower Extremity - use unna wrap to anchor Elevate legs to the level of the heart and pump ankles as often as possible Home Health: Wound #2 Left,Medial Lower Leg: Continue Home Health Visits Home Health Nurse may visit PRN to address patient  s wound care needs. FACE TO FACE ENCOUNTER: MEDICARE and MEDICAID PATIENTS: I certify that this patient is under my care and that I had a face-to-face encounter that meets the physician face-to-face encounter requirements with this patient on this date. The encounter with the patient was in whole or in part for the following MEDICAL CONDITION: (primary reason for Home Healthcare) MEDICAL NECESSITY: I certify, that based on my findings, NURSING services are a medically necessary home health service. HOME BOUND STATUS: I certify that my clinical findings support that this patient is homebound (i.e., Due to illness or injury, pt requires aid of supportive devices such as crutches, cane, wheelchairs, walkers, the use of special transportation or the assistance of another person to leave their place of residence. There is a normal inability to leave the home and doing so requires considerable and taxing effort. Other absences are for medical reasons / religious services and are infrequent or of short duration when for other  reasons). If current dressing causes regression in wound condition, may D/C ordered dressing product/s and apply Normal Saline Moist Dressing daily until next Wound Healing Center / Other MD appointment. Notify Wound Healing Center of regression in wound condition at [phone]. Please direct any NON-WOUND related issues/requests for orders to patient's Primary Care Physician JEHIELI, BRASSELL ([id number]) 1 changed her dressing to Iodoflex, Profore light compression. 2 I am not sure why we are not making more progress here Electronic Signature(s) Signed: 05/22/2015 7:58:31 AM By: Baltazar Najjar MD Entered By: Baltazar Najjar on 05/21/2015 15:20:44 Bowsher, [name] ([id number]) -------------------------------------------------------------------------------- SuperBill Details Patient Name: [name] Date of Service: 05/21/2015 Medical Record Patient Account Number: [id number] Number: Treating RN: Clover Mealy RN, BSN, Rita November 21, 1945 [phone] y.o. 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, left I70.322 leg Facility Procedures CPT4: Description Modifier Quantity Code 36644034 99212 - WOUND CARE VISIT-LEV 2 EST PT 1 CPT4: 74259563 (Facility Use Only) 87564PP - APPLY MULTLAY COMPRS LWR LT 1 LEG Physician Procedures CPT4: Description Modifier Quantity Code 2951884 16606 - WC PHYS LEVEL 2 - EST PT 1 ICD-10 Description Diagnosis 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

## 2015-05-24 DIAGNOSIS — I70342 Atherosclerosis of unspecified type of bypass graft(s) of the left leg with ulceration of calf: Secondary | ICD-10-CM | POA: Diagnosis not present

## 2015-05-24 DIAGNOSIS — E1151 Type 2 diabetes mellitus with diabetic peripheral angiopathy without gangrene: Secondary | ICD-10-CM | POA: Diagnosis not present

## 2015-05-24 DIAGNOSIS — Z7982 Long term (current) use of aspirin: Secondary | ICD-10-CM | POA: Diagnosis not present

## 2015-05-24 DIAGNOSIS — Z7902 Long term (current) use of antithrombotics/antiplatelets: Secondary | ICD-10-CM | POA: Diagnosis not present

## 2015-05-24 DIAGNOSIS — Z95820 Peripheral vascular angioplasty status with implants and grafts: Secondary | ICD-10-CM | POA: Diagnosis not present

## 2015-05-24 DIAGNOSIS — L97229 Non-pressure chronic ulcer of left calf with unspecified severity: Secondary | ICD-10-CM | POA: Diagnosis not present

## 2015-05-24 DIAGNOSIS — Z7984 Long term (current) use of oral hypoglycemic drugs: Secondary | ICD-10-CM | POA: Diagnosis not present

## 2015-05-24 DIAGNOSIS — Z4800 Encounter for change or removal of nonsurgical wound dressing: Secondary | ICD-10-CM | POA: Diagnosis not present

## 2015-05-28 ENCOUNTER — Encounter: Payer: Medicare Other | Admitting: Internal Medicine

## 2015-05-28 DIAGNOSIS — E11622 Type 2 diabetes mellitus with other skin ulcer: Secondary | ICD-10-CM | POA: Diagnosis not present

## 2015-05-28 DIAGNOSIS — I739 Peripheral vascular disease, unspecified: Secondary | ICD-10-CM | POA: Diagnosis not present

## 2015-05-28 DIAGNOSIS — I70342 Atherosclerosis of unspecified type of bypass graft(s) of the left leg with ulceration of calf: Secondary | ICD-10-CM | POA: Diagnosis not present

## 2015-05-28 DIAGNOSIS — I10 Essential (primary) hypertension: Secondary | ICD-10-CM | POA: Diagnosis not present

## 2015-05-28 DIAGNOSIS — L97221 Non-pressure chronic ulcer of left calf limited to breakdown of skin: Secondary | ICD-10-CM | POA: Diagnosis not present

## 2015-05-28 DIAGNOSIS — E1140 Type 2 diabetes mellitus with diabetic neuropathy, unspecified: Secondary | ICD-10-CM | POA: Diagnosis not present

## 2015-05-28 DIAGNOSIS — L97821 Non-pressure chronic ulcer of other part of left lower leg limited to breakdown of skin: Secondary | ICD-10-CM | POA: Diagnosis not present

## 2015-05-28 DIAGNOSIS — I70322 Atherosclerosis of unspecified type of bypass graft(s) of the extremities with rest pain, left leg: Secondary | ICD-10-CM | POA: Diagnosis not present

## 2015-05-28 DIAGNOSIS — M1990 Unspecified osteoarthritis, unspecified site: Secondary | ICD-10-CM | POA: Diagnosis not present

## 2015-05-28 DIAGNOSIS — I70248 Atherosclerosis of native arteries of left leg with ulceration of other part of lower left leg: Secondary | ICD-10-CM | POA: Diagnosis not present

## 2015-05-28 NOTE — Progress Notes (Signed)
RAMYAH, PANKOWSKI ([id number]) Visit Report for 05/28/2015 Arrival Information Details Patient Name: [name], [name] Date of Service: 05/28/2015 1:30 PM Medical Record Patient Account Number: [id number] Number: Treating RN: Clover Mealy, RN, BSN, Rita 1945/12/31 [phone] y.o. Other Clinician: Date of Birth/Sex: Female) Treating ROBSON, MICHAEL Primary Care Physician: Sherrie Mustache Physician/Extender: G Referring Physician: Sherrie Mustache Weeks in Treatment: 13 Visit Information History Since Last Visit Added or deleted any medications: No Patient Arrived: Wheel Chair Any new allergies or adverse reactions: No Arrival Time: 13:08 Had a fall or experienced change in No Accompanied By: friend activities of daily living that may affect Transfer Assistance: None risk of falls: Patient Identification Verified: Yes Signs or symptoms of abuse/neglect since last No Secondary Verification Process Yes visito Completed: Hospitalized since last visit: No Patient Requires Transmission- No Has Dressing in Place as Prescribed: Yes Based Precautions: Has Compression in Place as Prescribed: Yes Patient Has Alerts: Yes Pain Present Now: No Patient Alerts: Patient on Blood Thinner plavix, aspirin  DMII ABI right 0.94 ABI left 1.16 Electronic Signature(s) Signed: 05/28/2015 4:07:00 PM By: Elpidio Eric BSN, RN Entered By: Elpidio Eric on 05/28/2015 13:47:31 [place], [name] ([id number]) -------------------------------------------------------------------------------- Encounter Discharge Information Details Patient Name: [name] Date of Service: 05/28/2015 1:30 PM Medical Record Patient Account Number: [id number] Number: Treating RN: Clover Mealy, RN, BSN, Rita Apr 04, 1945 [phone] y.o. Other Clinician: Date of Birth/Sex: Female) Treating ROBSON, MICHAEL Primary Care Physician: Sherrie Mustache Physician/Extender: G Referring Physician: Augustin Coupe in Treatment: 64 Encounter  Discharge Information Items Discharge Pain Level: 0 Discharge Condition: Stable Ambulatory Status: Wheelchair Discharge Destination: Home Transportation: Private Auto Accompanied By: friend Schedule Follow-up Appointment: No Medication Reconciliation completed and provided to Patient/Care No Lorrine Killilea: Provided on Clinical Summary of Care: 05/28/2015 Form Type Recipient Paper Patient MW Electronic Signature(s) Signed: 05/28/2015 1:48:18 PM By: Gwenlyn Perking Entered By: Gwenlyn Perking on 05/28/2015 13:48:18 [place], [name] ([id number]) -------------------------------------------------------------------------------- Lower Extremity Assessment Details Patient Name: [name] Date of Service: 05/28/2015 1:30 PM Medical Record Patient Account Number: [id number] Number: Treating RN: Clover Mealy, RN, BSN, Rita 04-28-1945 [phone] y.o. Other Clinician: Date of Birth/Sex: Female) Treating ROBSON, MICHAEL Primary Care Physician: Sherrie Mustache Physician/Extender: G Referring Physician: Sherrie Mustache Weeks in Treatment: 13 Vascular Assessment Pulses: Posterior Tibial Dorsalis Pedis Palpable: [Left:Yes] Extremity colors, hair growth, and conditions: Extremity Color: [Left:Mottled] Hair Growth on Extremity: [Left:Yes] Temperature of Extremity: [Left:Warm] Capillary Refill: [Left:< 3 seconds] Electronic Signature(s) Signed: 05/28/2015 4:07:00 PM By: Elpidio Eric BSN, RN Entered By: Elpidio Eric on 05/28/2015 13:09:01 Schleicher, [name] ([id number]) -------------------------------------------------------------------------------- Multi Wound Chart Details Patient Name: [name] Date of Service: 05/28/2015 1:30 PM Medical Record Patient Account Number: [id number] Number: Treating RN: Clover Mealy, RN, BSN, Rita 1945-03-27 [phone] y.o. 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] Comorbid History: [2:Hypertension, Peripheral N/A Arterial Disease, Type II Diabetes, Osteoarthritis, Neuropathy] Date Acquired: [2:01/28/2015] [N/A:N/A] Weeks of Treatment: [2:13] [N/A:N/A] Wound Status: [2:Open] [N/A:N/A] Measurements L x W x D 1x1.6x0.1 [N/A:N/A] (cm) Area (cm) : [2:1.257] [N/A:N/A] Volume (cm) : [2:0.126] [N/A:N/A] % Reduction in Area: [2:-122.50%] [N/A:N/A] % Reduction in Volume: -11.50% [N/A:N/A] Classification: [2:Full  Thickness Without Exposed Support Structures] [N/A:N/A] HBO Classification: [2:Grade 1] [N/A:N/A] Exudate Amount: [2:Small] [N/A:N/A] Exudate Type: [2:Serosanguineous] [N/A:N/A] Exudate Color: [2:red, brown] [N/A:N/A] Wound Margin: [2:Flat and Intact] [N/A:N/A] Granulation Amount: [2:Large (67-100%)] [N/A:N/A] Granulation Quality: [2:Pink, Pale] [N/A:N/A] Necrotic Amount: [2:None Present (0%)] [N/A:N/A] Exposed Structures: Fascia: No N/A N/A Fat: No Tendon: No Muscle: No Joint: No Bone: No Limited to Skin Breakdown Epithelialization: Medium (34-66%) N/A N/A Periwound Skin Texture: Edema: Yes N/A N/A Excoriation: No Induration: No Callus: No Crepitus: No Fluctuance: No Friable: No Rash: No Scarring: No Periwound Skin Moist: Yes N/A N/A Moisture: Maceration: No Dry/Scaly: No Periwound Skin Color: Atrophie Blanche: No N/A N/A Cyanosis: No Ecchymosis: No Erythema: No Hemosiderin Staining: No Mottled: No Pallor: No Rubor: No Temperature: No Abnormality N/A N/A Tenderness on Yes N/A N/A Palpation: Wound Preparation: Ulcer  Cleansing: N/A N/A Rinsed/Irrigated with Saline, Other: soap and water Topical Anesthetic Applied: Other: lidocaine 4% Treatment Notes Electronic Signature(s) Signed: 05/28/2015 4:07:00 PM By: Elpidio Eric BSN, RN Entered By: Elpidio Eric on 05/28/2015 13:34:50 [place], [name] ([id number]) -------------------------------------------------------------------------------- Multi-Disciplinary Care Plan Details Patient Name: [name] Date of Service: 05/28/2015 1:30 PM Medical Record Patient Account Number: [id number] Number: Treating RN: Clover Mealy, RN, BSN, Rita 1945-08-01 [phone] y.o. Other Clinician: Date of Birth/Sex: Female) Treating ROBSON, MICHAEL Primary Care Physician: Sherrie Mustache Physician/Extender: G Referring Physician: Sherrie Mustache Weeks in Treatment: 13 Active Inactive Abuse / Safety / Falls / Self Care Management Nursing Diagnoses: Potential for falls Goals: Patient will remain injury free Date Initiated: 02/20/2015 Goal Status: Active Interventions: Assess fall risk on admission and as needed Notes: Nutrition Nursing Diagnoses: Potential for alteratiion in Nutrition/Potential for imbalanced nutrition Goals: Patient/caregiver will maintain therapeutic glucose control Date Initiated: 02/20/2015 Goal Status: Active Interventions: Assess patient nutrition upon admission and as needed per policy Notes: Orientation to the Wound Care Program Nursing Diagnoses: Knowledge deficit related to the wound healing center program Goals: KYOMI, HECTOR ([id number]) Patient/caregiver will verbalize understanding of the Wound Healing Center Program Date Initiated: 02/20/2015 Goal Status: Active Interventions: Provide education on orientation to the wound center Notes: Wound/Skin Impairment Nursing Diagnoses: Impaired tissue integrity Goals: Ulcer/skin breakdown will heal within 14 weeks Date Initiated: 02/20/2015 Goal Status: Active Interventions: Assess  ulceration(s) every visit Notes: Electronic Signature(s) Signed: 05/28/2015 4:07:00 PM By: Elpidio Eric BSN, RN Entered By: Elpidio Eric on 05/28/2015 13:34:32 [place], [name] ([id number]) -------------------------------------------------------------------------------- Pain Assessment Details Patient Name: [name] Date of Service: 05/28/2015 1:30 PM Medical Record Patient Account Number: [id number] Number: Treating RN: Clover Mealy, RN, BSN, Rita 1945-03-18 [phone] y.o. Other Clinician: Date of Birth/Sex: Female) Treating ROBSON, MICHAEL Primary Care Physician: Sherrie Mustache Physician/Extender: G Referring Physician: Sherrie Mustache Weeks in Treatment: 13 Active Problems Location of Pain Severity and Description of Pain Patient Has Paino No Site Locations Pain Management and Medication Current Pain Management: Electronic Signature(s) Signed: 05/28/2015 4:07:00 PM By: Elpidio Eric BSN, RN Entered By: Elpidio Eric on 05/28/2015 13:08:43 [place], [name] ([id number]) -------------------------------------------------------------------------------- Patient/Caregiver Education Details Patient Name: [name] Date of Service: 05/28/2015 1:30 PM Medical Record Patient Account Number: [id number] Number: Treating RN: Clover Mealy RN, BSN, Rita 05-31-1945 [phone] y.o. Other Clinician: Date of Birth/Gender: Female) Treating ROBSON, MICHAEL Primary Care Physician: Sherrie Mustache Physician/Extender: G Referring Physician: Augustin Coupe in Treatment: 13 Education Assessment Education Provided To: Patient Education Topics Provided Welcome To The Wound Care Center: Methods: Explain/Verbal Responses: State content correctly Electronic Signature(s) Signed: 05/28/2015 4:07:00 PM By: Elpidio Eric BSN,  RN Entered By: Elpidio EricAfful, Rita on 05/28/2015 13:47:21 Olejnik, [name] ([id number]) -------------------------------------------------------------------------------- Wound Assessment  Details Patient Name: [name] InfanteWOODS, Nikkol N. Date of Service: 05/28/2015 1:30 PM Medical Record Patient Account Number: [id number] Number: Treating RN: Clover MealyAfful, RN, BSN, Rita 04/03/1945 [phone](69 y.o. Other Clinician: Date of Birth/Sex: Female) Treating ROBSON, MICHAEL Primary Care Physician: Sherrie MustacheJADALI, FAYEGH Physician/Extender: G Referring Physician: Sherrie MustacheJADALI, FAYEGH Weeks in Treatment: 13 Wound Status Wound Number: 2 Primary Arterial Insufficiency Ulcer Etiology: Wound Location: Left Lower Leg - Medial Wound Open Wounding Event: Blister Status: Date Acquired: 01/28/2015 Comorbid Hypertension, Peripheral Arterial Weeks Of Treatment: 13 History: Disease, Type II Diabetes, Clustered Wound: No Osteoarthritis, Neuropathy Photos Photo Uploaded By: Elpidio EricAfful, Rita on 05/28/2015 15:43:06 Wound Measurements Length: (cm) 1 Width: (cm) 1.6 Depth: (cm) 0.1 Area: (cm) 1.257 Volume: (cm) 0.126 % Reduction in Area: -122.5% % Reduction in Volume: -11.5% Epithelialization: Medium (34-66%) Tunneling: No Undermining: No Wound Description Full Thickness Without Exposed Foul Odor Classification: Support Structures Diabetic Severity Grade 1 (Wagner): Wound Margin: Flat and Intact Exudate Amount: Small Exudate Type: Serosanguineous Exudate Color: red, brown Martinovich, Tiera N. ([id number]) After Cleansing: No Wound Bed Granulation Amount: Large (67-100%) Exposed Structure Granulation Quality: Pink, Pale Fascia Exposed: No Necrotic Amount: None Present (0%) Fat Layer Exposed: No Tendon Exposed: No Muscle Exposed: No Joint Exposed: No Bone Exposed: No Limited to Skin Breakdown Periwound Skin Texture Texture Color No Abnormalities Noted: No No Abnormalities Noted: No Callus: No Atrophie Blanche: No Crepitus: No Cyanosis: No Excoriation: No Ecchymosis: No Fluctuance: No Erythema: No Friable: No Hemosiderin Staining: No Induration: No Mottled: No Localized Edema: Yes Pallor:  No Rash: No Rubor: No Scarring: No Temperature / Pain Moisture Temperature: No Abnormality No Abnormalities Noted: No Tenderness on Palpation: Yes Dry / Scaly: No Maceration: No Moist: Yes Wound Preparation Ulcer Cleansing: Rinsed/Irrigated with Saline, Other: soap and water, Topical Anesthetic Applied: Other: lidocaine 4%, Treatment Notes Wound #2 (Left, Medial Lower Leg) 1. Cleansed with: Cleanse wound with antibacterial soap and water 3. Peri-wound Care: Moisturizing lotion 4. Dressing Applied: Iodosorb Ointment 5. Secondary Dressing Applied Dry Gauze 7. Secured with 3 Layer Compression System - Left Lower Extremity Electronic Signature(s) [name] InfanteWOODS, Shley N. ([id number]) Signed: 05/28/2015 4:07:00 PM By: Elpidio EricAfful, Rita BSN, RN Entered By: Elpidio EricAfful, Rita on 05/28/2015 13:19:47 [place]oley, [name] ([id number]) -------------------------------------------------------------------------------- Vitals Details Patient Name: [name] InfanteWOODS, Maryhelen N. Date of Service: 05/28/2015 1:30 PM Medical Record Patient Account Number: [id number] Number: Treating RN: Clover MealyAfful, RN, BSN, Rita 04/03/1945 [phone](69 y.o. Other Clinician: Date of Birth/Sex: Female) Treating ROBSON, MICHAEL Primary Care Physician: Sherrie MustacheJADALI, FAYEGH Physician/Extender: G Referring Physician: Sherrie MustacheJADALI, FAYEGH Weeks in Treatment: 13 Vital Signs Time Taken: 13:13 Temperature (F): 97.7 Height (in): 67 Pulse (bpm): 75 Weight (lbs): 302 Respiratory Rate (breaths/min): 17 Body Mass Index (BMI): 47.3 Blood Pressure (mmHg): 140/71 Reference Range: 80 - 120 mg / dl Electronic Signature(s) Signed: 05/28/2015 4:07:00 PM By: Elpidio EricAfful, Rita BSN, RN Entered By: Elpidio EricAfful, Rita on 05/28/2015 13:13:55

## 2015-05-28 NOTE — Progress Notes (Signed)
[name], [name] ([id number]) Visit Report for 05/28/2015 Chief Complaint Document Details Patient Name: [name], [name] Date of Service: 05/28/2015 1:30 PM Medical Record Patient Account Number: [id number] Number: Treating RN: [name] Mealy, RN, BSN, [name] 04/23/1945 [phone] y.o. Other Clinician: Date of Birth/Sex: Female) Treating [name] Primary Care Physician/Extender: Kerry Kass, [facility] Physician: Referring Physician: Sherrie Mustache [name] in Treatment: 13 Information Obtained from: Patient Chief Complaint Left calf ulcerations. Arterial insufficiency. Electronic Signature(s) Signed: 05/28/2015 3:36:31 PM By: [name] Najjar MD Entered By: [name] on 05/28/2015 13:42:22 [name], [name] ([id number]) -------------------------------------------------------------------------------- Debridement Details Patient Name: [name] Date of Service: 05/28/2015 1:30 PM Medical Record Patient Account Number: [id number] Number: Treating RN: [name] Mealy RN, BSN, [name] 20-Jan-1946 [phone] y.o. Other Clinician: Date of Birth/Sex: Female) Treating [name] Primary Care Physician/Extender: Kerry Kass, [facility] Physician: Referring Physician: Sherrie Mustache [name] in Treatment: 13 Debridement Performed for Wound #2 Left,Medial Lower Leg Assessment: Performed By: Physician [name] Caul, MD Debridement: Debridement Pre-procedure Yes Verification/Time Out Taken: Start Time: 13:33 Pain Control: Lidocaine 4% Topical Solution Level: Skin/Subcutaneous Tissue Total Area Debrided (L x 1 (cm) x 1.6 (cm) = 1.6 (cm) W): Tissue and other Non-Viable, Fibrin/Slough, Subcutaneous material debrided: Instrument: Curette Bleeding: Minimum Hemostasis Achieved: Pressure End Time: 13:36 Procedural Pain: 3 Post Procedural Pain: 3 Response to Treatment: Procedure was tolerated well Post Debridement Measurements of Total Wound Length: (cm) 1 Width: (cm) 1.6 Depth: (cm) 0.1 Volume:  (cm) 0.126 Post Procedure Diagnosis Same as Pre-procedure Electronic Signature(s) Signed: 05/28/2015 3:36:31 PM By: [name] Najjar MD Signed: 05/28/2015 4:07:00 PM By: [name] BSN, RN Entered By: [name] on 05/28/2015 13:42:02 [name], [name] ([id number]) [name], [name] ([id number]) -------------------------------------------------------------------------------- HPI Details Patient Name: [name] Date of Service: 05/28/2015 1:30 PM Medical Record Patient Account Number: [id number] Number: Treating RN: [name] Mealy, RN, BSN, [name] 01/15/1946 [phone] y.o. Other Clinician: Date of Birth/Sex: Female) Treating [name] Primary Care Physician/Extender: Kerry Kass, [facility] Physician: Referring Physician: Sherrie Mustache [name] in Treatment: 13 History of Present Illness HPI Description: Pleasant 70 year old with diabetes (no hemoglobin A1c available) and severe peripheral vascular disease. She reports a prior left lower extremity stent. Records unavailable. Underwent multiple level angioplasty of left lower extremity 10/29/2014 by [name]. Developed left calf ulcerations in late December 2016. Denies any trauma. Seen by her PCP. Bactrim prescribed. Referred to the wound clinic. Biopsy culture 02/20/2015 grew methicillin sensitive staph aureus, sensitive to Bactrim. Performing dressing changes with Prisma. Using a Tubigrip for edema control. Scheduled to see [name] in follow-up 02/25/2015. Cancelled because of weather. Rescheduled for 03/06/2015. She returns to clinic for follow-up and is without new complaints. Pain improved. No ischemic rest pain. No fever or chills. Minimal drainage. 03/13/2015 -- records obtained from [facility] shows that she was seen by [name] on 03/12/2015 and given tramadol and other symptomatic treatment. Recent ABI done showed right to be 0.94 and left to be 0.47 a left lower extremity arterial duplex was notable for an occluded  stent and distal ATA. Recommendations were for left lower extremity angiogram with intervention. on 03/11/2015 she was taken up for a aortogram with angioplasty of the left posterior tibial artery and tibioperoneal trunk, above-knee popliteal artery and almost entire SFA, stent placement to the SFA and popliteal artery. 03/20/15 her arterial interventions noted. The patient states her left leg feels better. He ischemic wound on her left anterior leg. She has been using Santyl 04/09/15; the patient arrives back today.  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 I was concerned about her last week as she had weeping edema fluid and areas of threatened ulceration around her wound. I ordered a silver alginate, Profore light wrap. She comes back today with an Radio broadcast assistant on apparently applied by [name] office. Her edema is under much better control and the weeping area here last week surrounding her major wound has resolved. She does have another open area which is small and superficial. I'm not sure I would put an Unna boot on this lady's degree of arterial insufficiency. 04/16/15; her original small wound appears to be healthy with good granulation her measurements are improved. She has a small area underneath this. Both underwent selective debridement since surface slough 04/23/15; the original wound has filled in there is healthy granulation here. The small area underneath this is healed. No debridement was required 04/30/15; the wound no longer has any depth. no Debridement required 05/07/15; the wound appears to be stable. Advancing epithelialization. Decrease wound volume. No Jambor, Anishka N. ([id number]) debridement is required. She has known arterial disease status post stent placement to the SFA and popliteal artery. 05/14/15 unfortunately the wound is not really decreased much this week she still has a rim of epithelialization. The wound bed  looks stable nevertheless I did a surgical debridement and there is some adherent surface slough. She has known PAD however the blood supply appears to be adequate status post stent to the SFA and popliteal and popliteal artery 05/21/15 wound is healthier than last week. No debridement is necessary. Known PAD status post stent to the SFA and popliteal artery 05/28/15; wound is no different from last week and in fact looks "stalled". Fairly we put Iodoflex on him last week however home health used Prisma Electronic Signature(s) Signed: 05/28/2015 3:36:31 PM By: [name] Najjar MD Entered By: [name] on 05/28/2015 13:43:27 [name], [name] ([id number]) -------------------------------------------------------------------------------- Physical Exam Details Patient Name: [name] Date of Service: 05/28/2015 1:30 PM Medical Record Patient Account Number: [id number] Number: Treating RN: [name] Mealy RN, BSN, [name] [birth year] [phone] y.o. Other Clinician: Date of Birth/Sex: Female) Treating Vontrell Pullman Primary Care Physician/Extender: Kerry Kass, [facility] Physician: Referring Physician: Sherrie Mustache [name] in Treatment: 13 Notes Wound exam; her edema is well controlled. There is no evidence that her PAD is active foot. She has no depth to this wound and reasonably healthy-looking granulation. I didn't debridement to see if there is a surface on here/bioburden. Electronic Signature(s) Signed: 05/28/2015 3:36:31 PM By: [name] Najjar MD Entered By: [name] on 05/28/2015 13:44:06 [name], [name] ([id number]) -------------------------------------------------------------------------------- Physician Orders Details Patient Name: [name] Date of Service: 05/28/2015 1:30 PM Medical Record Patient Account Number: [id number] Number: Treating RN: [name] Mealy RN, BSN, [name] [birth year] [phone] y.o. Other Clinician: Date of Birth/Sex: Female) Treating Johnae Friley Primary Care  Physician/Extender: Kerry Kass, [facility] Physician: Referring Physician: Augustin Coupe in Treatment: 96 Verbal / Phone Orders: Yes Clinician: Afful, RN, BSN, [name] Read Back and Verified: Yes Diagnosis Coding Wound Cleansing Wound #2 Left,Medial Lower Leg o Cleanse wound with mild soap and water o May shower with protection. Anesthetic Wound #2 Left,Medial Lower Leg o Topical Lidocaine 4% cream applied to wound bed prior to debridement Skin Barriers/Peri-Wound Care Wound #2 Left,Medial Lower Leg o Barrier cream Primary Wound Dressing Wound #2 Left,Medial Lower Leg o Iodoflex Secondary Dressing Wound #2 Left,Medial Lower Leg o ABD pad Dressing Change Frequency Wound #2 Left,Medial  Lower Leg o Change dressing every week Follow-up Appointments Wound #2 Left,Medial Lower Leg o Return Appointment in 1 week. Edema Control Wound #2 Left,Medial Lower Leg o 3 Layer Compression System - Left Lower Extremity - use unna wrap to anchor Lanzo, Liam N. ([id number]) o Elevate legs to the level of the heart and pump ankles as often as possible Home Health Wound #2 Left,Medial Lower Leg o D/C Home Health Services Electronic Signature(s) Signed: 05/28/2015 3:36:31 PM By: [name] Najjarobson, Keniyah Gelinas MD Signed: 05/28/2015 4:07:00 PM By: [name] EricAfful, [name] BSN, RN Entered By: [name] EricAfful, [name] on 05/28/2015 13:37:41 [name], [name] [name] ([id number]) -------------------------------------------------------------------------------- Problem List Details Patient Name: [name] InfanteWOODS, [name] Date of Service: 05/28/2015 1:30 PM Medical Record Patient Account Number: [id number] Number: Treating RN: [name] MealyAfful, RN, BSN, [name] 1945-07-29 [phone](69 y.o. 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 ([id number]) -------------------------------------------------------------------------------- Progress Note Details Patient Name: [name] InfanteWOODS, [name] Date of Service: 05/28/2015 1:30 PM Medical Record Patient Account Number: [id number] Number: Treating RN: [name] MealyAfful, RN, BSN, [name] 1945-07-29 [phone](69 y.o. Other Clinician: Date of Birth/Sex: Female) Treating Jermie Hippe Primary Care Physician/Extender: Kerry KassG [facility]ADALI, [facility] Physician: Referring Physician: Sherrie MustacheJADALI, FAYEGH [name] in Treatment: 13 Subjective Chief Complaint Information obtained from Patient Left calf ulcerations. Arterial insufficiency. History of Present Illness (HPI) Pleasant 70 year old with diabetes (no hemoglobin A1c available) and severe peripheral vascular disease. She reports a prior left lower extremity stent. Records unavailable. Underwent multiple level angioplasty of left lower extremity 10/29/2014 by Dr. Wyn Quakerew. Developed left calf ulcerations in late December 2016. Denies any trauma. Seen by her PCP. Bactrim prescribed. Referred to the wound clinic. Biopsy culture 02/20/2015 grew methicillin sensitive staph aureus, sensitive to Bactrim. Performing dressing changes with Prisma. Using a Tubigrip for edema control. Scheduled to see Dr. Wyn Quakerew in follow-up 02/25/2015. Cancelled because of weather. Rescheduled for 03/06/2015. She returns to clinic for follow-up and is  without new complaints. Pain improved. No ischemic rest pain. No fever or chills. Minimal drainage. 03/13/2015 -- records obtained from [facility] shows that she was seen by [name] on 03/12/2015 and given tramadol and other symptomatic treatment. Recent ABI done showed right to be 0.94 and left to be 0.47 a left lower extremity arterial duplex was notable for an occluded stent and distal ATA. Recommendations were for left lower extremity angiogram with intervention. on 03/11/2015 she was taken up for a aortogram with angioplasty of the left posterior tibial artery and tibioperoneal trunk, above-knee popliteal artery and almost entire SFA, stent placement to the SFA and popliteal artery. 03/20/15 her arterial interventions noted. The patient states her left leg feels better. He ischemic wound on her left anterior leg. She has been using Santyl 04/09/15; the patient arrives back today. 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 I was concerned about her last week as she had weeping edema fluid and areas of  threatened ulceration around her wound. I ordered a silver alginate, Profore light wrap. She comes back today with an Radio broadcast assistant on apparently applied by [name] office. Her edema is under much better control and the weeping area here last week surrounding her major wound has resolved. She does have another open area which is small and superficial. I'm not sure I would put an Unna boot on this lady's degree of arterial insufficiency. 04/16/15; her original small wound appears to be healthy with good granulation her measurements are improved. She has a small area underneath this. Both underwent selective debridement since surface Colley, Tonnya N. ([id number]) slough 04/23/15; the original wound has filled in there is healthy granulation here. The small area underneath this is healed. No debridement was required 04/30/15; the wound  no longer has any depth. no Debridement required 05/07/15; the wound appears to be stable. Advancing epithelialization. Decrease wound volume. No debridement is required. She has known arterial disease status post stent placement to the SFA and popliteal artery. 05/14/15 unfortunately the wound is not really decreased much this week she still has a rim of epithelialization. The wound bed looks stable nevertheless I did a surgical debridement and there is some adherent surface slough. She has known PAD however the blood supply appears to be adequate status post stent to the SFA and popliteal and popliteal artery 05/21/15 wound is healthier than last week. No debridement is necessary. Known PAD status post stent to the SFA and popliteal artery 05/28/15; wound is no different from last week and in fact looks "stalled". Fairly we put Iodoflex on him last week however home health used Prisma Objective Constitutional Vitals Time Taken: 1:13 PM, Height: 67 in, Weight: 302 lbs, BMI: 47.3, Temperature: 97.7 F, Pulse: 75 bpm, Respiratory Rate: 17 breaths/min, Blood Pressure: 140/71 mmHg. Integumentary (Hair, Skin) Wound #2 status is Open. Original cause of wound was Blister. The wound is located on the Left,Medial Lower Leg. The wound measures 1cm length x 1.6cm width x 0.1cm depth; 1.257cm^2 area and 0.126cm^3 volume. The wound is limited to skin breakdown. There is no tunneling or undermining noted. There is a small amount of serosanguineous drainage noted. The wound margin is flat and intact. There is large (67-100%) pink, pale granulation within the wound bed. There is no necrotic tissue within the wound bed. The periwound skin appearance exhibited: Localized Edema, Moist. The periwound skin appearance did not exhibit: Callus, Crepitus, Excoriation, Fluctuance, Friable, Induration, Rash, Scarring, Dry/Scaly, Maceration, Atrophie Blanche, Cyanosis, Ecchymosis, Hemosiderin Staining, Mottled, Pallor,  Rubor, Erythema. Periwound temperature was noted as No Abnormality. The periwound has tenderness on palpation. Assessment Active Problems ICD-10 I73.9 - Peripheral vascular disease, unspecified [name], [name]. 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 There was a Skin/Subcutaneous Tissue Debridement ([id number]) debridement with total area of 1.6 sq cm performed by [name] Caul, MD. with the following instrument(s): Curette to remove Non-Viable tissue/material including Fibrin/Slough and Subcutaneous after achieving pain control using Lidocaine 4% Topical Solution. A time out was conducted prior to the start of the procedure. A Minimum amount of bleeding was controlled with Pressure. The procedure was tolerated well with a pain level of 3 throughout  and a pain level of 3 following the procedure. Post Debridement Measurements: 1cm length x 1.6cm width x 0.1cm depth; 0.126cm^3 volume. Post procedure Diagnosis Wound #2: Same as Pre-Procedure Plan Wound Cleansing: Wound #2 Left,Medial Lower Leg: Cleanse wound with mild soap and water May shower with protection. Anesthetic: Wound #2 Left,Medial Lower Leg: Topical Lidocaine 4% cream applied to wound bed prior to debridement Skin Barriers/Peri-Wound Care: Wound #2 Left,Medial Lower Leg: Barrier cream Primary Wound Dressing: Wound #2 Left,Medial Lower Leg: Iodoflex Secondary Dressing: Wound #2 Left,Medial Lower Leg: ABD pad Dressing Change Frequency: Wound #2 Left,Medial Lower Leg: Change dressing every week Follow-up Appointments: Wound #2 Left,Medial Lower Leg: LYLIANNA, FRAISER  ([id number]) Return Appointment in 1 week. Edema Control: Wound #2 Left,Medial Lower Leg: 3 Layer Compression System - Left Lower Extremity - use unna wrap to anchor Elevate legs to the level of the heart and pump ankles as often as possible Home Health: Wound #2 Left,Medial Lower Leg: D/C Home Health Services #1 we will continue with Iodoflex, under a Profore light compression. We have discharged home health for now Electronic Signature(s) Signed: 05/28/2015 3:36:31 PM By: [name] Najjar MD Entered By: [name] on 05/28/2015 13:44:48 Wuebker, [name] ([id number]) -------------------------------------------------------------------------------- SuperBill Details Patient Name: [name] Date of Service: 05/28/2015 Medical Record Patient Account Number: [id number] Number: Treating RN: [name] Mealy RN, BSN, [name] 1946-01-14 [phone] y.o. 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

## 2015-06-04 ENCOUNTER — Encounter: Payer: Medicare Other | Admitting: Internal Medicine

## 2015-06-04 DIAGNOSIS — M1990 Unspecified osteoarthritis, unspecified site: Secondary | ICD-10-CM | POA: Diagnosis not present

## 2015-06-04 DIAGNOSIS — L97821 Non-pressure chronic ulcer of other part of left lower leg limited to breakdown of skin: Secondary | ICD-10-CM | POA: Diagnosis not present

## 2015-06-04 DIAGNOSIS — I70248 Atherosclerosis of native arteries of left leg with ulceration of other part of lower left leg: Secondary | ICD-10-CM | POA: Diagnosis not present

## 2015-06-04 DIAGNOSIS — E11622 Type 2 diabetes mellitus with other skin ulcer: Secondary | ICD-10-CM | POA: Diagnosis not present

## 2015-06-04 DIAGNOSIS — I739 Peripheral vascular disease, unspecified: Secondary | ICD-10-CM | POA: Diagnosis not present

## 2015-06-04 DIAGNOSIS — E1140 Type 2 diabetes mellitus with diabetic neuropathy, unspecified: Secondary | ICD-10-CM | POA: Diagnosis not present

## 2015-06-04 DIAGNOSIS — I70322 Atherosclerosis of unspecified type of bypass graft(s) of the extremities with rest pain, left leg: Secondary | ICD-10-CM | POA: Diagnosis not present

## 2015-06-04 DIAGNOSIS — I10 Essential (primary) hypertension: Secondary | ICD-10-CM | POA: Diagnosis not present

## 2015-06-04 DIAGNOSIS — L97221 Non-pressure chronic ulcer of left calf limited to breakdown of skin: Secondary | ICD-10-CM | POA: Diagnosis not present

## 2015-06-04 DIAGNOSIS — I70342 Atherosclerosis of unspecified type of bypass graft(s) of the left leg with ulceration of calf: Secondary | ICD-10-CM | POA: Diagnosis not present

## 2015-06-06 NOTE — Progress Notes (Signed)
[name], Meleena N. ([id number]) Visit Report for 06/04/2015 Chief Complaint Document Details Patient Name: [name], [name] Date of Service: 06/04/2015 1:30 PM Medical Record Patient Account Number: [id number] Number: Treating RN: Clover MealyAfful, RN, BSN, Rita 1945/05/25 [phone](70 y.o. Other Clinician: Date of Birth/Sex: Female) Treating Kiernan Farkas Primary Care Physician/Extender: Kerry KassG [facility] Physician: Referring Physician: Sherrie MustacheJADALI, FAYEGH Weeks in Treatment: 14 Information Obtained from: Patient Chief Complaint Left calf ulcerations. Arterial insufficiency. Electronic Signature(s) Signed: 06/05/2015 5:01:27 PM By: Baltazar Najjarobson, Trixy Loyola MD Entered By: Baltazar Najjarobson, Nathalie Cavendish on 06/04/2015 14:25:46 Tift, [name] ([id number]) -------------------------------------------------------------------------------- HPI Details Patient Name: [name], Nyaira N. Date of Service: 06/04/2015 1:30 PM Medical Record Patient Account Number: [id number] Number: Treating RN: Clover MealyAfful, RN, BSN, Rita 1945/05/25 [phone](70 y.o. Other Clinician: Date of Birth/Sex: Female) Treating Cedric Mcclaine Primary Care Physician/Extender: Kerry KassG JADALI, [facility] Physician: Referring Physician: Sherrie MustacheJADALI, FAYEGH Weeks in Treatment: 14 History of Present Illness HPI Description: Pleasant 70 year old with diabetes (no hemoglobin A1c available) and severe peripheral vascular disease. She reports a prior left lower extremity stent. Records unavailable. Underwent multiple level angioplasty of left lower extremity 10/29/2014 by Dr. Wyn Quakerew. Developed left calf ulcerations in late December 2016. Denies any trauma. Seen by her PCP. Bactrim prescribed. Referred to the wound clinic. Biopsy culture 02/20/2015 grew methicillin sensitive staph aureus, sensitive to Bactrim. Performing dressing changes with Prisma. Using a Tubigrip for edema control. Scheduled to see Dr. Wyn Quakerew in follow-up 02/25/2015. Cancelled because of weather. Rescheduled for 03/06/2015. She  returns to clinic for follow-up and is without new complaints. Pain improved. No ischemic rest pain. No fever or chills. Minimal drainage. 03/13/2015 -- records obtained from [facility] shows that she was seen by Dr. dew on 03/12/2015 and given tramadol and other symptomatic treatment. Recent ABI done showed right to be 0.94 and left to be 0.47 a left lower extremity arterial duplex was notable for an occluded stent and distal ATA. Recommendations were for left lower extremity angiogram with intervention. on 03/11/2015 she was taken up for a aortogram with angioplasty of the left posterior tibial artery and tibioperoneal trunk, above-knee popliteal artery and almost entire SFA, stent placement to the SFA and popliteal artery. 03/20/15 her arterial interventions noted. The patient states her left leg feels better. He ischemic wound on her left anterior leg. She has been using Santyl 04/09/15; the patient arrives back today. 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 dew. I was concerned about her last week as she had weeping edema fluid and areas of threatened ulceration around her wound. I ordered a silver alginate, Profore light wrap. She comes back today with an Radio broadcast assistantUnna boot on apparently applied by Dr. Driscilla Grammesdew's office. Her edema is under much better control and the weeping area here last week surrounding her major wound has resolved. She does have another open area which is small and superficial. I'm not sure I would put an Unna boot on this lady's degree of arterial insufficiency. 04/16/15; her original small wound appears to be healthy with good granulation her measurements are improved. She has a small area underneath this. Both underwent selective debridement since surface slough 04/23/15; the original wound has filled in there is healthy granulation here. The small area underneath this is healed. No debridement was required 04/30/15;  the wound no longer has any depth. no Debridement required 05/07/15; the wound appears to be stable. Advancing epithelialization. Decrease wound volume. No Ferreri, [name] ([id number]) debridement is required. She has known arterial disease  status post stent placement to the SFA and popliteal artery. 05/14/15 unfortunately the wound is not really decreased much this week she still has a rim of epithelialization. The wound bed looks stable nevertheless I did a surgical debridement and there is some adherent surface slough. She has known PAD however the blood supply appears to be adequate status post stent to the SFA and popliteal and popliteal artery 05/21/15 wound is healthier than last week. No debridement is necessary. Known PAD status post stent to the SFA and popliteal artery 05/28/15; wound is no different from last week and in fact looks "stalled". Fairly we put Iodoflex on him last week however home health used Prisma 06/04/15 if anything the wound area appears to be larger. Electronic Signature(s) Signed: 06/05/2015 5:01:27 PM By: Baltazar Najjar MD Entered By: Baltazar Najjar on 06/04/2015 14:26:24 CLOTILDA, HAFER ([id number]) -------------------------------------------------------------------------------- Physical Exam Details Patient Name: [name] Date of Service: 06/04/2015 1:30 PM Medical Record Patient Account Number: [id number] Number: Treating RN: Clover Mealy RN, BSN, Rita [birth year] [phone] y.o. Other Clinician: Date of Birth/Sex: Female) Treating Gwendolyn Nishi Primary Care Physician/Extender: Kerry Kass, [facility] Physician: Referring Physician: Sherrie Mustache Weeks in Treatment: 14 Notes Wound exam; her edema appears to be well controlled there is no surrounding erythema and no evidence of infection. She has a history of PAD although I don't think this is predominantly responsible. The wound bed looks healthy with healthy appearing granulation. Electronic  Signature(s) Signed: 06/05/2015 5:01:27 PM By: Baltazar Najjar MD Entered By: Baltazar Najjar on 06/04/2015 14:27:04 Watts, [name] ([id number]) -------------------------------------------------------------------------------- Physician Orders Details Patient Name: [name] Date of Service: 06/04/2015 1:30 PM Medical Record Patient Account Number: [id number] Number: Treating RN: Clover Mealy RN, BSN, Rita [birth year] [phone] y.o. Other Clinician: Date of Birth/Sex: Female) Treating Aser Nylund Primary Care Physician/Extender: Kerry Kass, [facility] Physician: Referring Physician: Augustin Coupe in Treatment: 15 Verbal / Phone Orders: Yes Clinician: Afful, RN, BSN, Rita Read Back and Verified: Yes Diagnosis Coding Wound Cleansing Wound #2 Left,Medial Lower Leg o Cleanse wound with mild soap and water Skin Barriers/Peri-Wound Care Wound #2 Left,Medial Lower Leg o Barrier cream o Moisturizing lotion Primary Wound Dressing Wound #2 Left,Medial Lower Leg o Aquacel Ag Secondary Dressing Wound #2 Left,Medial Lower Leg o Dry Gauze Dressing Change Frequency Wound #2 Left,Medial Lower Leg o Change dressing every week Follow-up Appointments Wound #2 Left,Medial Lower Leg o Return Appointment in 1 week. Edema Control Wound #2 Left,Medial Lower Leg o 3 Layer Compression System - Left Lower Extremity Electronic Signature(s) Signed: 06/05/2015 3:02:50 PM By: Elpidio Eric BSN, RN [address], [place] ([id number]) Signed: 06/05/2015 5:01:27 PM By: Baltazar Najjar MD Entered By: Elpidio Eric on 06/04/2015 13:41:16 Taitt, [name] ([id number]) -------------------------------------------------------------------------------- Problem List Details Patient Name: [name] Date of Service: 06/04/2015 1:30 PM Medical Record Patient Account Number: [id number] Number: Treating RN: Clover Mealy RN, BSN, Rita 1945-03-27 [phone] y.o. 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 Other Clinician: Date of Birth/Sex: Female) Treating Jezabelle Chisolm Primary Care Physician/Extender: Kerry Kass, [facility] Physician: Referring Physician: Sherrie Mustache Weeks in Treatment: 14 Subjective Chief Complaint Information obtained from Patient Left calf ulcerations. Arterial insufficiency. History of Present Illness (HPI) Pleasant 71 year old with diabetes (no hemoglobin A1c available) and severe peripheral vascular disease. She reports a prior left lower extremity stent. Records unavailable. Underwent multiple level angioplasty of left lower extremity 10/29/2014 by Dr. Wyn Quaker. Developed left calf ulcerations in late December 2016. Denies any trauma. Seen by her PCP. Bactrim prescribed. Referred to the wound clinic. Biopsy culture 02/20/2015 grew methicillin sensitive staph aureus, sensitive to Bactrim. Performing dressing  changes with Prisma. Using a Tubigrip for edema control. Scheduled to see Dr. Wyn Quaker in follow-up 02/25/2015. Cancelled because of weather. Rescheduled for 03/06/2015. She returns to clinic for follow-up and is without new complaints. Pain improved. No ischemic rest pain. No fever or chills. Minimal drainage. 03/13/2015 -- records obtained from [facility] shows that she was seen by Dr. dew on 03/12/2015 and given tramadol and other symptomatic treatment. Recent ABI done showed right to be 0.94 and left to be 0.47 a left lower extremity arterial duplex was notable for an occluded stent and distal ATA. Recommendations were for left lower extremity angiogram with intervention. on 03/11/2015 she was taken up for a aortogram with angioplasty of the left posterior tibial artery and tibioperoneal trunk, above-knee popliteal artery and almost entire SFA, stent placement to the SFA and popliteal artery. 03/20/15 her arterial interventions noted. The patient states her left leg feels better. He ischemic wound on her left anterior leg. She has been using Santyl 04/09/15; the patient arrives back today. 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 dew. I was concerned about her last week as she had weeping edema fluid and areas of threatened ulceration around her wound. I ordered a silver alginate, Profore light wrap. She comes back today with an Radio broadcast assistant on apparently applied by Dr. Driscilla Grammes office. Her edema is under much better control and the weeping area here last week surrounding her major wound has resolved. She does have another open area which is small and superficial. I'm not sure I would put an Unna boot on this lady's degree of arterial insufficiency. 04/16/15; her original small wound appears to be healthy with good granulation her measurements are improved. She has a small area underneath this. Both underwent selective debridement since  surface Gotwalt, Edwena N. ([id number]) slough 04/23/15; the original wound has filled in there is healthy granulation here. The small area underneath this is healed. No debridement was required 04/30/15; the wound no longer has any depth. no Debridement required 05/07/15; the wound appears to be stable. Advancing epithelialization. Decrease wound volume. No debridement is required. She has known arterial disease status post stent placement to the SFA and popliteal artery. 05/14/15 unfortunately the wound is not really decreased much this week she still has a rim of epithelialization. The wound bed looks stable nevertheless I did a surgical debridement and there is some adherent surface slough. She has known PAD however the blood supply appears to be adequate status post stent to the SFA and popliteal and popliteal artery 05/21/15 wound  is healthier than last week. No debridement is necessary. Known PAD status post stent to the SFA and popliteal artery 05/28/15; wound is no different from last week and in fact looks "stalled". Fairly we put Iodoflex on him last week however home health used Prisma 06/04/15 if anything the wound area appears to be larger. Objective Constitutional Vitals Time Taken: 1:28 PM, Height: 67 in, Weight: 302 lbs, BMI: 47.3, Temperature: 97.9 F, Pulse: 72 bpm, Respiratory Rate: 17 breaths/min, Blood Pressure: 165/74 mmHg. Integumentary (Hair, Skin) Wound #2 status is Open. Original cause of wound was Blister. The wound is located on the Left,Medial Lower Leg. The wound measures 1.3cm length x 1.5cm width x 0.1cm depth; 1.532cm^2 area and 0.153cm^3 volume. The wound is limited to skin breakdown. There is no tunneling or undermining noted. There is a small amount of serosanguineous drainage noted. The wound margin is flat and intact. There is large (67-100%) pink, pale granulation within the wound bed. There is no necrotic tissue within the wound bed. The periwound skin  appearance exhibited: Localized Edema, Moist. The periwound skin appearance did not exhibit: Callus, Crepitus, Excoriation, Fluctuance, Friable, Induration, Rash, Scarring, Dry/Scaly, Maceration, Atrophie Blanche, Cyanosis, Ecchymosis, Hemosiderin Staining, Mottled, Pallor, Rubor, Erythema. Periwound temperature was noted as No Abnormality. The periwound has tenderness on palpation. 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 Edema Control: Wound #2 Left,Medial Lower Leg: 3 Layer Compression System - Left Lower Extremity We changed the dressing to silver alginatevfrom iodoflex to the last 2 weeks Electronic Signature(s) Signed: 06/05/2015 5:01:27 PM By: Baltazar Najjar MD Entered By: Baltazar Najjar on 06/04/2015 14:28:02 Neiswender, [name] ([id number]) -------------------------------------------------------------------------------- SuperBill Details Patient Name: [name] Date of Service: 06/04/2015 Medical Record Patient Account Number: [id number] Number: Treating RN: Clover Mealy RN, BSN, Rita [birth year] [phone] y.o. 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

## 2015-06-06 NOTE — Progress Notes (Signed)
[name], [name] Visit Report for 06/04/2015 Arrival Information Details Patient Name: [name], [name] Date of Service: 06/04/2015 1:30 PM Medical Record Patient Account Number: [id number] Number: Treating RN: Clover Mealy, RN, BSN, [name] 1945-04-15 [phone] y.o. Other Clinician: Date of Birth/Sex: Female) Treating ROBSON, MICHAEL Primary Care Physician: Sherrie Mustache Physician/Extender: G Referring Physician: Sherrie Mustache Weeks in Treatment: 14 Visit Information History Since Last Visit Added or deleted any medications: No Patient Arrived: Wheel Chair Any new allergies or adverse reactions: No Arrival Time: 13:24 Had a fall or experienced change in No Accompanied By: self activities of daily living that may affect Transfer Assistance: None risk of falls: Patient Identification Verified: Yes Has Dressing in Place as Prescribed: Yes Secondary Verification Process Yes Has Compression in Place as Prescribed: Yes Completed: Pain Present Now: No Patient Requires Transmission- No Based Precautions: Patient Has Alerts: Yes Patient Alerts: Patient on Blood Thinner plavix, aspirin  DMII ABI right 0.94 ABI left 1.16 Electronic Signature(s) Signed: 06/05/2015 3:02:50 PM By: Elpidio Eric BSN, RN Entered By: Elpidio Eric on 06/04/2015 13:24:30 Brallier, [name] ([id number]) -------------------------------------------------------------------------------- Encounter Discharge Information Details Patient Name: [name] Date of Service: 06/04/2015 1:30 PM Medical Record Patient Account Number: [id number] Number: Treating RN: Clover Mealy, RN, BSN, [name] [birth year] [phone] y.o. Other Clinician: Date of Birth/Sex: Female) Treating ROBSON, MICHAEL Primary Care Physician: Sherrie Mustache Physician/Extender: G Referring Physician: Sherrie Mustache Weeks in Treatment: 14 Encounter Discharge Information Items Discharge Pain Level: 0 Discharge Condition: Stable Ambulatory  Status: Wheelchair Discharge Destination: Home Transportation: Other Accompanied By: nephew Schedule Follow-up Appointment: No Medication Reconciliation completed and provided to Patient/Care No Srinidhi Landers: Provided on Clinical Summary of Care: 06/04/2015 Form Type Recipient Paper Patient MW Electronic Signature(s) Signed: 06/04/2015 1:52:20 PM By: Gwenlyn Perking Entered By: Gwenlyn Perking on 06/04/2015 13:52:19 Knaggs, [name] ([id number]) -------------------------------------------------------------------------------- Lower Extremity Assessment Details Patient Name: [name] Date of Service: 06/04/2015 1:30 PM Medical Record Patient Account Number: [id number] Number: Treating RN: Clover Mealy, RN, BSN, [name] 07-Oct-1945 [phone] y.o. Other Clinician: Date of Birth/Sex: Female) Treating ROBSON, MICHAEL Primary Care Physician: Sherrie Mustache Physician/Extender: G Referring Physician: Sherrie Mustache Weeks in Treatment: 14 Vascular Assessment Pulses: Posterior Tibial Dorsalis Pedis Palpable: [Left:Yes] Extremity colors, hair growth, and conditions: Extremity Color: [Left:Mottled] Hair Growth on Extremity: [Left:No] Temperature of Extremity: [Left:Warm] Capillary Refill: [Left:< 3 seconds] Toe Nail Assessment Left: Right: Thick: Yes Discolored: No Deformed: No Improper Length and Hygiene: No Electronic Signature(s) Signed: 06/05/2015 3:02:50 PM By: Elpidio Eric BSN, RN Entered By: Elpidio Eric on 06/04/2015 13:24:47 Marsch, [name] ([id number]) -------------------------------------------------------------------------------- Multi Wound Chart Details Patient Name: [name] Date of Service: 06/04/2015 1:30 PM Medical Record Patient Account Number: [id number] Number: Treating RN: Clover Mealy, RN, BSN, [name] 07-Mar-1945 [phone] y.o. 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] Comorbid History: [2:Hypertension, Peripheral N/A Arterial Disease, Type II Diabetes, Osteoarthritis, Neuropathy] Date Acquired: [2:01/28/2015] [N/A:N/A] Weeks of Treatment: [2:14] [N/A:N/A] Wound Status: [2:Open] [N/A:N/A] Measurements L x W x D 1.3x1.5x0.1 [N/A:N/A] (cm) Area (cm) : [2:1.532] [N/A:N/A] Volume (cm) : [2:0.153] [N/A:N/A] % Reduction in Area: [2:-171.20%] [N/A:N/A] % Reduction in Volume: -35.40% [N/A:N/A] Classification: [  2:Full Thickness Without Exposed Support Structures] [N/A:N/A] HBO Classification: [2:Grade 1] [N/A:N/A] Exudate Amount: [2:Small] [N/A:N/A] Exudate Type: [2:Serosanguineous] [N/A:N/A] Exudate Color: [2:red, brown] [N/A:N/A] Wound Margin: [2:Flat and Intact] [N/A:N/A] Granulation Amount: [2:Large (67-100%)] [N/A:N/A] Granulation Quality: [2:Pink, Pale] [N/A:N/A] Necrotic Amount: [2:None Present (0%)] [N/A:N/A] Exposed Structures: Fascia: No N/A N/A Fat: No Tendon: No Muscle: No Joint: No Bone: No Limited to Skin Breakdown Epithelialization: Medium (34-66%) N/A N/A Periwound Skin Texture: Edema: Yes N/A N/A Excoriation: No Induration: No Callus: No Crepitus: No Fluctuance: No Friable: No Rash: No Scarring: No Periwound Skin Moist: Yes N/A N/A Moisture: Maceration: No Dry/Scaly: No Periwound Skin Color: Atrophie Blanche: No N/A N/A Cyanosis: No Ecchymosis: No Erythema: No Hemosiderin Staining: No Mottled: No Pallor: No Rubor: No Temperature: No Abnormality N/A N/A Tenderness on Yes N/A N/A Palpation: Wound Preparation: Ulcer  Cleansing: N/A N/A Rinsed/Irrigated with Saline, Other: soap and water Topical Anesthetic Applied: Other: lidocaine 4% Treatment Notes Electronic Signature(s) Signed: 06/05/2015 3:02:50 PM By: Elpidio Eric BSN, RN Entered By: Elpidio Eric on 06/04/2015 13:40:01 [name], [name] ([id number]) -------------------------------------------------------------------------------- Multi-Disciplinary Care Plan Details Patient Name: [name] Date of Service: 06/04/2015 1:30 PM Medical Record Patient Account Number: [id number] Number: Treating RN: Clover Mealy, RN, BSN, [name] 1945/08/22 [phone] y.o. Other Clinician: Date of Birth/Sex: Female) Treating ROBSON, MICHAEL Primary Care Physician: Sherrie Mustache Physician/Extender: G Referring Physician: Sherrie Mustache Weeks in Treatment: 14 Active Inactive Abuse / Safety / Falls / Self Care Management Nursing Diagnoses: Potential for falls Goals: Patient will remain injury free Date Initiated: 02/20/2015 Goal Status: Active Interventions: Assess fall risk on admission and as needed Notes: Nutrition Nursing Diagnoses: Potential for alteratiion in Nutrition/Potential for imbalanced nutrition Goals: Patient/caregiver will maintain therapeutic glucose control Date Initiated: 02/20/2015 Goal Status: Active Interventions: Assess patient nutrition upon admission and as needed per policy Notes: Orientation to the Wound Care Program Nursing Diagnoses: Knowledge deficit related to the wound healing center program Goals: [name], [name] ([id number]) Patient/caregiver will verbalize understanding of the Wound Healing Center Program Date Initiated: 02/20/2015 Goal Status: Active Interventions: Provide education on orientation to the wound center Notes: Wound/Skin Impairment Nursing Diagnoses: Impaired tissue integrity Goals: Ulcer/skin breakdown will heal within 14 weeks Date Initiated: 02/20/2015 Goal Status: Active Interventions: Assess  ulceration(s) every visit Notes: Electronic Signature(s) Signed: 06/05/2015 3:02:50 PM By: Elpidio Eric BSN, RN Entered By: Elpidio Eric on 06/04/2015 13:39:36 [name], [name] ([id number]) -------------------------------------------------------------------------------- Patient/Caregiver Education Details Patient Name: [name] Date of Service: 06/04/2015 1:30 PM Medical Record Patient Account Number: [id number] Number: Treating RN: Clover Mealy, RN, BSN, [name] 29-Oct-1945 [phone] y.o. Other Clinician: Date of Birth/Gender: Female) Treating ROBSON, MICHAEL Primary Care Physician: Sherrie Mustache Physician/Extender: G Referring Physician: Augustin Coupe in Treatment: 14 Education Assessment Education Provided To: Patient and Caregiver Education Topics Provided Welcome To The Wound Care Center: Methods: Explain/Verbal Responses: State content correctly Electronic Signature(s) Signed: 06/05/2015 3:02:50 PM By: Elpidio Eric BSN, RN Entered By: Elpidio Eric on 06/04/2015 13:52:14 [name], [name] ([id number]) -------------------------------------------------------------------------------- Wound Assessment Details Patient Name: [name] Date of Service: 06/04/2015 1:30 PM Medical Record Patient Account Number: [id number] Number: Treating RN: Clover Mealy RN, BSN, [name] 1945/04/29 [phone] y.o. Other Clinician: Date of Birth/Sex: Female) Treating ROBSON, MICHAEL Primary Care Physician: Sherrie Mustache Physician/Extender: G Referring Physician: Sherrie Mustache Weeks in Treatment: 14 Wound Status Wound Number: 2 Primary Arterial Insufficiency Ulcer Etiology: Wound Location: Left Lower Leg - Medial Wound Open Wounding Event: Blister Status: Date Acquired: 01/28/2015 Comorbid Hypertension, Peripheral Arterial  Weeks Of Treatment: 14 History: Disease, Type II Diabetes, Clustered Wound: No Osteoarthritis, Neuropathy Photos Photo Uploaded By: Elpidio EricAfful, [name] on 06/04/2015  16:24:22 Wound Measurements Length: (cm) 1.3 Width: (cm) 1.5 Depth: (cm) 0.1 Area: (cm) 1.532 Volume: (cm) 0.153 % Reduction in Area: -171.2% % Reduction in Volume: -35.4% Epithelialization: Medium (34-66%) Tunneling: No Undermining: No Wound Description Full Thickness Without Exposed Foul Odor Classification: Support Structures [name], [name] ([id number]) After Cleansing: No Diabetic Severity Grade 1 (Wagner): Wound Margin: Flat and Intact Exudate Amount: Small Exudate Type: Serosanguineous Exudate Color: red, brown Wound Bed Granulation Amount: Large (67-100%) Exposed Structure Granulation Quality: Pink, Pale Fascia Exposed: No Necrotic Amount: None Present (0%) Fat Layer Exposed: No Tendon Exposed: No Muscle Exposed: No Joint Exposed: No Bone Exposed: No Limited to Skin Breakdown Periwound Skin Texture Texture Color No Abnormalities Noted: No No Abnormalities Noted: No Callus: No Atrophie Blanche: No Crepitus: No Cyanosis: No Excoriation: No Ecchymosis: No Fluctuance: No Erythema: No Friable: No Hemosiderin Staining: No Induration: No Mottled: No Localized Edema: Yes Pallor: No Rash: No Rubor: No Scarring: No Temperature / Pain Moisture Temperature: No Abnormality No Abnormalities Noted: No Tenderness on Palpation: Yes Dry / Scaly: No Maceration: No Moist: Yes Wound Preparation Ulcer Cleansing: Rinsed/Irrigated with Saline, Other: soap and water, Topical Anesthetic Applied: Other: lidocaine 4%, Treatment Notes Wound #2 (Left, Medial Lower Leg) 1. Cleansed with: Cleanse wound with antibacterial soap and water 3. Peri-wound Care: Barrier cream Moisturizing lotion [name], [name] ([id number]) 4. Dressing Applied: Aquacel Ag 5. Secondary Dressing Applied Dry Gauze 7. Secured with 3 Layer Compression System - Left Lower Extremity Electronic Signature(s) Signed: 06/05/2015 3:02:50 PM By: Elpidio EricAfful, [name] BSN, RN Entered By: Elpidio EricAfful, [name] on  06/04/2015 13:34:29 [name], [name] [name] ([id number]) -------------------------------------------------------------------------------- Vitals Details Patient Name: [name], [name] Date of Service: 06/04/2015 1:30 PM Medical Record Patient Account Number: [id number] Number: Treating RN: Clover MealyAfful, RN, BSN, [name] 11/17/1945 [phone](69 y.o. Other Clinician: Date of Birth/Sex: Female) Treating ROBSON, MICHAEL Primary Care Physician: Sherrie MustacheJADALI, FAYEGH Physician/Extender: G Referring Physician: Sherrie MustacheJADALI, FAYEGH Weeks in Treatment: 14 Vital Signs Time Taken: 13:28 Temperature (F): 97.9 Height (in): 67 Pulse (bpm): 72 Weight (lbs): 302 Respiratory Rate (breaths/min): 17 Body Mass Index (BMI): 47.3 Blood Pressure (mmHg): 165/74 Reference Range: 80 - 120 mg / dl Electronic Signature(s) Signed: 06/05/2015 3:02:50 PM By: Elpidio EricAfful, [name] BSN, RN Entered By: Elpidio EricAfful, [name] on 06/04/2015 13:29:09

## 2015-06-07 DIAGNOSIS — L97229 Non-pressure chronic ulcer of left calf with unspecified severity: Secondary | ICD-10-CM | POA: Diagnosis not present

## 2015-06-07 DIAGNOSIS — Z7984 Long term (current) use of oral hypoglycemic drugs: Secondary | ICD-10-CM | POA: Diagnosis not present

## 2015-06-07 DIAGNOSIS — E1151 Type 2 diabetes mellitus with diabetic peripheral angiopathy without gangrene: Secondary | ICD-10-CM | POA: Diagnosis not present

## 2015-06-07 DIAGNOSIS — Z7982 Long term (current) use of aspirin: Secondary | ICD-10-CM | POA: Diagnosis not present

## 2015-06-07 DIAGNOSIS — Z4800 Encounter for change or removal of nonsurgical wound dressing: Secondary | ICD-10-CM | POA: Diagnosis not present

## 2015-06-07 DIAGNOSIS — Z95820 Peripheral vascular angioplasty status with implants and grafts: Secondary | ICD-10-CM | POA: Diagnosis not present

## 2015-06-07 DIAGNOSIS — Z7902 Long term (current) use of antithrombotics/antiplatelets: Secondary | ICD-10-CM | POA: Diagnosis not present

## 2015-06-07 DIAGNOSIS — I70342 Atherosclerosis of unspecified type of bypass graft(s) of the left leg with ulceration of calf: Secondary | ICD-10-CM | POA: Diagnosis not present

## 2015-06-11 ENCOUNTER — Encounter: Payer: Medicare Other | Admitting: Internal Medicine

## 2015-06-11 DIAGNOSIS — E1140 Type 2 diabetes mellitus with diabetic neuropathy, unspecified: Secondary | ICD-10-CM | POA: Diagnosis not present

## 2015-06-11 DIAGNOSIS — I70248 Atherosclerosis of native arteries of left leg with ulceration of other part of lower left leg: Secondary | ICD-10-CM | POA: Diagnosis not present

## 2015-06-11 DIAGNOSIS — I739 Peripheral vascular disease, unspecified: Secondary | ICD-10-CM | POA: Diagnosis not present

## 2015-06-11 DIAGNOSIS — I10 Essential (primary) hypertension: Secondary | ICD-10-CM | POA: Diagnosis not present

## 2015-06-11 DIAGNOSIS — M1990 Unspecified osteoarthritis, unspecified site: Secondary | ICD-10-CM | POA: Diagnosis not present

## 2015-06-11 DIAGNOSIS — I70342 Atherosclerosis of unspecified type of bypass graft(s) of the left leg with ulceration of calf: Secondary | ICD-10-CM | POA: Diagnosis not present

## 2015-06-11 DIAGNOSIS — L97821 Non-pressure chronic ulcer of other part of left lower leg limited to breakdown of skin: Secondary | ICD-10-CM | POA: Diagnosis not present

## 2015-06-11 DIAGNOSIS — L97221 Non-pressure chronic ulcer of left calf limited to breakdown of skin: Secondary | ICD-10-CM | POA: Diagnosis not present

## 2015-06-11 DIAGNOSIS — E11622 Type 2 diabetes mellitus with other skin ulcer: Secondary | ICD-10-CM | POA: Diagnosis not present

## 2015-06-11 DIAGNOSIS — I70322 Atherosclerosis of unspecified type of bypass graft(s) of the extremities with rest pain, left leg: Secondary | ICD-10-CM | POA: Diagnosis not present

## 2015-06-11 NOTE — Progress Notes (Addendum)
[name], [name] ([id number]) Visit Report for 06/11/2015 Arrival Information Details Patient Name: [name] Date of Service: 06/11/2015 2:15 PM Medical Record Patient Account Number: [id number] Number: Treating RN: Clover Mealy, RN, BSN, Rita 08-19-1945 [phone] y.o. Other Clinician: Date of Birth/Sex: Female) Treating ROBSON, MICHAEL Primary Care Physician: Sherrie Mustache Physician/Extender: G Referring Physician: Sherrie Mustache Weeks in Treatment: 15 Visit Information History Since Last Visit Added or deleted any medications: No Patient Arrived: Ambulatory Any new allergies or adverse reactions: No Arrival Time: 14:24 Had a fall or experienced change in No Accompanied By: Nphew activities of daily living that may affect Transfer Assistance: None risk of falls: Patient Identification Verified: Yes Has Dressing in Place as Prescribed: Yes Secondary Verification Process Yes Has Compression in Place as Prescribed: Yes Completed: Pain Present Now: No Patient Requires Transmission- No Based Precautions: Patient Has Alerts: Yes Patient Alerts: Patient on Blood Thinner plavix, aspirin  DMII ABI right 0.94 ABI left 1.16 Electronic Signature(s) Signed: 06/11/2015 4:09:11 PM By: Elpidio Eric BSN, RN Entered By: Elpidio Eric on 06/11/2015 14:25:06 Plancarte, [name] ([id number]) -------------------------------------------------------------------------------- Encounter Discharge Information Details Patient Name: [name] Date of Service: 06/11/2015 2:15 PM Medical Record Patient Account Number: [id number] Number: Treating RN: Clover Mealy, RN, BSN, Rita Jun 23, 1945 [phone] y.o. Other Clinician: Date of Birth/Sex: Female) Treating ROBSON, MICHAEL Primary Care Physician: Sherrie Mustache Physician/Extender: G Referring Physician: Sherrie Mustache Weeks in Treatment: 15 Encounter Discharge Information Items Discharge Pain Level: 0 Discharge Condition: Stable Ambulatory  Status: Wheelchair Discharge Destination: Home Transportation: Private Auto Accompanied By: nephew Schedule Follow-up Appointment: No Medication Reconciliation completed and provided to Patient/Care No Jamie Belger: Provided on Clinical Summary of Care: 06/11/2015 Form Type Recipient Paper Patient MW Electronic Signature(s) Signed: 06/11/2015 4:09:11 PM By: Elpidio Eric BSN, RN Previous Signature: 06/11/2015 3:03:00 PM Version By: Gwenlyn Perking Entered By: Elpidio Eric on 06/11/2015 15:05:24 Frances, [name] ([id number]) -------------------------------------------------------------------------------- Lower Extremity Assessment Details Patient Name: [name] Date of Service: 06/11/2015 2:15 PM Medical Record Patient Account Number: [id number] Number: Treating RN: Clover Mealy, RN, BSN, Rita [birth year] [phone] y.o. Other Clinician: Date of Birth/Sex: Female) Treating ROBSON, MICHAEL Primary Care Physician: Sherrie Mustache Physician/Extender: G Referring Physician: Sherrie Mustache Weeks in Treatment: 15 Vascular Assessment Pulses: Posterior Tibial Extremity colors, hair growth, and conditions: Extremity Color: [Left:Normal] Hair Growth on Extremity: [Left:No] Temperature of Extremity: [Left:Warm] Capillary Refill: [Left:< 3 seconds] Toe Nail Assessment Left: Right: Thick: Yes Discolored: No Deformed: No Improper Length and Hygiene: No Electronic Signature(s) Signed: 06/11/2015 4:09:11 PM By: Elpidio Eric BSN, RN Entered By: Elpidio Eric on 06/11/2015 14:32:05 Cerone, [name] ([id number]) -------------------------------------------------------------------------------- Multi Wound Chart Details Patient Name: [name] Date of Service: 06/11/2015 2:15 PM Medical Record Patient Account Number: [id number] Number: Treating RN: Clover Mealy, RN, BSN, Rita 1945-08-14 [phone] y.o. 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] Comorbid History: [2:Hypertension, Peripheral N/A Arterial Disease, Type II Diabetes, Osteoarthritis, Neuropathy] Date Acquired: [2:01/28/2015] [N/A:N/A] Weeks of Treatment: [2:15] [N/A:N/A] Wound Status: [2:Open] [N/A:N/A] Measurements L x W x D 0.6x0.6x0.1 [N/A:N/A] (cm) Area (cm) : [2:0.283] [N/A:N/A] Volume (cm) : [2:0.028] [N/A:N/A] % Reduction in Area: [2:49.90%] [N/A:N/A] %  Reduction in Volume: 75.20% [N/A:N/A] Classification: [2:Full Thickness Without Exposed Support Structures] [N/A:N/A] HBO Classification: [2:Grade 1] [N/A:N/A] Exudate Amount: [2:Small] [N/A:N/A] Exudate Type: [2:Serosanguineous] [N/A:N/A] Exudate Color: [2:red, brown] [N/A:N/A] Wound Margin: [2:Flat and Intact] [N/A:N/A] Granulation Amount: [2:Large (67-100%)] [N/A:N/A] Granulation Quality: [2:Pink, Pale] [N/A:N/A] Necrotic Amount: [2:None Present (0%)] [N/A:N/A] Exposed Structures: Fascia: No N/A N/A Fat: No Tendon: No Muscle: No Joint: No Bone: No Limited to Skin Breakdown Epithelialization: Medium (34-66%) N/A N/A Periwound Skin Texture: Edema: Yes N/A N/A Excoriation: No Induration: No Callus: No Crepitus: No Fluctuance: No Friable: No Rash: No Scarring: No Periwound Skin Moist: Yes N/A N/A Moisture: Maceration: No Dry/Scaly: No Periwound Skin Color: Atrophie Blanche: No N/A N/A Cyanosis: No Ecchymosis: No Erythema: No Hemosiderin Staining: No Mottled: No Pallor: No Rubor: No Temperature: No Abnormality N/A N/A Tenderness on Yes N/A  N/A Palpation: Wound Preparation: Ulcer Cleansing: N/A N/A Rinsed/Irrigated with Saline, Other: soap and water Topical Anesthetic Applied: None Treatment Notes Electronic Signature(s) Signed: 06/11/2015 4:09:11 PM By: Elpidio EricAfful, Rita BSN, RN Entered By: Elpidio EricAfful, Rita on 06/11/2015 15:02:57 Aquilino, [name] ([id number]) -------------------------------------------------------------------------------- Multi-Disciplinary Care Plan Details Patient Name: [name] InfanteWOODS, Poet N. Date of Service: 06/11/2015 2:15 PM Medical Record Patient Account Number: [id number] Number: Treating RN: Clover MealyAfful, RN, BSN, Rita 07/05/1945 [phone](70 y.o. Other Clinician: Date of Birth/Sex: Female) Treating ROBSON, MICHAEL Primary Care Physician: Sherrie MustacheJADALI, FAYEGH Physician/Extender: G Referring Physician: Sherrie MustacheJADALI, FAYEGH Weeks in Treatment: 15 Active Inactive Abuse / Safety / Falls / Self Care Management Nursing Diagnoses: Potential for falls Goals: Patient will remain injury free Date Initiated: 02/20/2015 Goal Status: Active Interventions: Assess fall risk on admission and as needed Notes: Nutrition Nursing Diagnoses: Potential for alteratiion in Nutrition/Potential for imbalanced nutrition Goals: Patient/caregiver will maintain therapeutic glucose control Date Initiated: 02/20/2015 Goal Status: Active Interventions: Assess patient nutrition upon admission and as needed per policy Notes: Orientation to the Wound Care Program Nursing Diagnoses: Knowledge deficit related to the wound healing center program Goals: [name] InfanteWOODS, Gladys N. ([id number]) Patient/caregiver will verbalize understanding of the Wound Healing Center Program Date Initiated: 02/20/2015 Goal Status: Active Interventions: Provide education on orientation to the wound center Notes: Wound/Skin Impairment Nursing Diagnoses: Impaired tissue integrity Goals: Ulcer/skin breakdown will heal within 14 weeks Date Initiated: 02/20/2015 Goal Status:  Active Interventions: Assess ulceration(s) every visit Notes: Electronic Signature(s) Signed: 06/11/2015 4:09:11 PM By: Elpidio EricAfful, Rita BSN, RN Entered By: Elpidio EricAfful, Rita on 06/11/2015 15:02:46 Trethewey, [name] ([id number]) -------------------------------------------------------------------------------- Pain Assessment Details Patient Name: [name] InfanteWOODS, Azhane N. Date of Service: 06/11/2015 2:15 PM Medical Record Patient Account Number: [id number] Number: Treating RN: Clover MealyAfful, RN, BSN, Rita 07/05/1945 [phone](70 y.o. Other Clinician: Date of Birth/Sex: Female) Treating ROBSON, MICHAEL Primary Care Physician: Sherrie MustacheJADALI, FAYEGH Physician/Extender: G Referring Physician: Sherrie MustacheJADALI, FAYEGH Weeks in Treatment: 15 Active Problems Location of Pain Severity and Description of Pain Patient Has Paino No Site Locations Pain Management and Medication Current Pain Management: Electronic Signature(s) Signed: 06/11/2015 4:09:11 PM By: Elpidio EricAfful, Rita BSN, RN Entered By: Elpidio EricAfful, Rita on 06/11/2015 14:25:29 Fringer, [name] ([id number]) -------------------------------------------------------------------------------- Patient/Caregiver Education Details Patient Name: [name] InfanteWOODS, Shaquita N. Date of Service: 06/11/2015 2:15 PM Medical Record Patient Account Number: [id number] Number: Treating RN: Clover MealyAfful, RN, BSN, Rita 07/05/1945 [phone](70 y.o. Other Clinician: Date of Birth/Gender: Female) Treating ROBSON, MICHAEL Primary Care Physician: Sherrie MustacheJADALI, FAYEGH Physician/Extender: G Referring Physician: Augustin CoupeJADALI, FAYEGH Weeks in Treatment: 15 Education Assessment Education Provided To: Patient Education Topics Provided Welcome To The Wound Care Center: Methods: Explain/Verbal Responses: State content correctly Electronic Signature(s) Signed: 06/11/2015 4:09:11  PM By: Elpidio Eric BSN, RN Entered By: Elpidio Eric on 06/11/2015 15:05:41 SKILYNN, DURNEY  ([id number]) -------------------------------------------------------------------------------- Wound Assessment Details Patient Name: [name] Date of Service: 06/11/2015 2:15 PM Medical Record Patient Account Number: [id number] Number: Treating RN: Clover Mealy, RN, BSN, Rita [birth year] [phone] y.o. Other Clinician: Date of Birth/Sex: Female) Treating ROBSON, MICHAEL Primary Care Physician: Sherrie Mustache Physician/Extender: G Referring Physician: Sherrie Mustache Weeks in Treatment: 15 Wound Status Wound Number: 2 Primary Arterial Insufficiency Ulcer Etiology: Wound Location: Left Lower Leg - Medial Wound Open Wounding Event: Blister Status: Date Acquired: 01/28/2015 Comorbid Hypertension, Peripheral Arterial Weeks Of Treatment: 15 History: Disease, Type II Diabetes, Clustered Wound: No Osteoarthritis, Neuropathy Photos Photo Uploaded By: Elpidio Eric on 06/11/2015 15:27:03 Wound Measurements Length: (cm) 0.6 Width: (cm) 0.6 Depth: (cm) 0.1 Area: (cm) 0.283 Volume: (cm) 0.028 % Reduction in Area: 49.9% % Reduction in Volume: 75.2% Epithelialization: Medium (34-66%) Tunneling: No Undermining: No Wound Description Full Thickness Without Exposed Foul Odor Classification: Support Structures Blessinger, Yaelis N. ([id number]) After Cleansing: No Diabetic Severity Grade 1 (Wagner): Wound Margin: Flat and Intact Exudate Amount: Small Exudate Type: Serosanguineous Exudate Color: red, brown Wound Bed Granulation Amount: Large (67-100%) Exposed Structure Granulation Quality: Pink, Pale Fascia Exposed: No Necrotic Amount: None Present (0%) Fat Layer Exposed: No Tendon Exposed: No Muscle Exposed: No Joint Exposed: No Bone Exposed: No Limited to Skin Breakdown Periwound Skin Texture Texture Color No Abnormalities Noted: No No Abnormalities Noted: No Callus: No Atrophie Blanche: No Crepitus: No Cyanosis: No Excoriation: No Ecchymosis: No Fluctuance:  No Erythema: No Friable: No Hemosiderin Staining: No Induration: No Mottled: No Localized Edema: Yes Pallor: No Rash: No Rubor: No Scarring: No Temperature / Pain Moisture Temperature: No Abnormality No Abnormalities Noted: No Tenderness on Palpation: Yes Dry / Scaly: No Maceration: No Moist: Yes Wound Preparation Ulcer Cleansing: Rinsed/Irrigated with Saline, Other: soap and water, Topical Anesthetic Applied: None Treatment Notes Wound #2 (Left, Medial Lower Leg) 1. Cleansed with: Cleanse wound with antibacterial soap and water 3. Peri-wound Care: Barrier cream Moisturizing lotion Dunlap, Videl N. ([id number]) 4. Dressing Applied: Aquacel Ag 5. Secondary Dressing Applied Dry Gauze 7. Secured with 3 Layer Compression System - Left Lower Extremity Electronic Signature(s) Signed: 06/11/2015 4:09:11 PM By: Elpidio Eric BSN, RN Entered By: Elpidio Eric on 06/11/2015 14:37:42 Pfluger, [name] ([id number]) -------------------------------------------------------------------------------- Vitals Details Patient Name: [name] Date of Service: 06/11/2015 2:15 PM Medical Record Patient Account Number: [id number] Number: Treating RN: Clover Mealy RN, BSN, Rita 01/17/1946 [phone] y.o. Other Clinician: Date of Birth/Sex: Female) Treating ROBSON, MICHAEL Primary Care Physician: Sherrie Mustache Physician/Extender: G Referring Physician: Sherrie Mustache Weeks in Treatment: 15 Vital Signs Time Taken: 14:28 Temperature (F): 97.8 Height (in): 67 Pulse (bpm): 83 Weight (lbs): 302 Respiratory Rate (breaths/min): 17 Body Mass Index (BMI): 47.3 Blood Pressure (mmHg): 147/49 Reference Range: 80 - 120 mg / dl Electronic Signature(s) Signed: 06/11/2015 4:09:11 PM By: Elpidio Eric BSN, RN Entered By: Elpidio Eric on 06/11/2015 14:29:19

## 2015-06-13 NOTE — Progress Notes (Signed)
SHARNESE, [name] ([id number]) Visit Report for 06/11/2015 Chief Complaint Document Details Patient Name: [name], [name] Date of Service: 06/11/2015 2:15 PM Medical Record Patient Account Number: [id number] Number: Treating RN: Clover Mealy, RN, BSN, Rita 1945-08-31 [phone] y.o. Other Clinician: Date of Birth/Sex: Female) Treating Deaven Urwin Primary Care Physician/Extender: Kerry Kass, [facility] Physician: Referring Physician: Sherrie Mustache Weeks in Treatment: 15 Information Obtained from: Patient Chief Complaint Left calf ulcerations. Arterial insufficiency. Electronic Signature(s) Signed: 06/13/2015 7:59:30 AM By: Baltazar Najjar MD Entered By: Baltazar Najjar on 06/12/2015 05:16:39 Gemme, [name] ([id number]) -------------------------------------------------------------------------------- HPI Details Patient Name: [name] Date of Service: 06/11/2015 2:15 PM Medical Record Patient Account Number: [id number] Number: Treating RN: Clover Mealy RN, BSN, Rita January 14, 1946 [phone] y.o. Other Clinician: Date of Birth/Sex: Female) Treating Darion Juhasz Primary Care Physician/Extender: Kerry Kass, [facility] Physician: Referring Physician: Sherrie Mustache Weeks in Treatment: 15 History of Present Illness HPI Description: Pleasant 70 year old with diabetes (no hemoglobin A1c available) and severe peripheral vascular disease. She reports a prior left lower extremity stent. Records unavailable. Underwent multiple level angioplasty of left lower extremity 10/29/2014 by Dr. Wyn Quaker. Developed left calf ulcerations in late December 2016. Denies any trauma. Seen by her PCP. Bactrim prescribed. Referred to the wound clinic. Biopsy culture 02/20/2015 grew methicillin sensitive staph aureus, sensitive to Bactrim. Performing dressing changes with Prisma. Using a Tubigrip for edema control. Scheduled to see Dr. Wyn Quaker in follow-up 02/25/2015. Cancelled because of weather. Rescheduled for 03/06/2015. She  returns to clinic for follow-up and is without new complaints. Pain improved. No ischemic rest pain. No fever or chills. Minimal drainage. 03/13/2015 -- records obtained from [facility] shows that she was seen by Dr. dew on 03/12/2015 and given tramadol and other symptomatic treatment. Recent ABI done showed right to be 0.94 and left to be 0.47 a left lower extremity arterial duplex was notable for an occluded stent and distal ATA. Recommendations were for left lower extremity angiogram with intervention. on 03/11/2015 she was taken up for a aortogram with angioplasty of the left posterior tibial artery and tibioperoneal trunk, above-knee popliteal artery and almost entire SFA, stent placement to the SFA and popliteal artery. 03/20/15 her arterial interventions noted. The patient states her left leg feels better. He ischemic wound on her left anterior leg. She has been using Santyl 04/09/15; the patient arrives back today. 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 dew. I was concerned about her last week as she had weeping edema fluid and areas of threatened ulceration around her wound. I ordered a silver alginate, Profore light wrap. She comes back today with an Radio broadcast assistant on apparently applied by Dr. Driscilla Grammes office. Her edema is under much better control and the weeping area here last week surrounding her major wound has resolved. She does have another open area which is small and superficial. I'm not sure I would put an Unna boot on this lady's degree of arterial insufficiency. 04/16/15; her original small wound appears to be healthy with good granulation her measurements are improved. She has a small area underneath this. Both underwent selective debridement since surface slough 04/23/15; the original wound has filled in there is healthy granulation here. The small area underneath this is healed. No debridement was required 04/30/15;  the wound no longer has any depth. no Debridement required 05/07/15; the wound appears to be stable. Advancing epithelialization. Decrease wound volume. No Hussey, Courtnee N. ([id number]) debridement is required. She has known arterial disease  status post stent placement to the SFA and popliteal artery. 05/14/15 unfortunately the wound is not really decreased much this week she still has a rim of epithelialization. The wound bed looks stable nevertheless I did a surgical debridement and there is some adherent surface slough. She has known PAD however the blood supply appears to be adequate status post stent to the SFA and popliteal and popliteal artery 05/21/15 wound is healthier than last week. No debridement is necessary. Known PAD status post stent to the SFA and popliteal artery 05/28/15; wound is no different from last week and in fact looks "stalled". Fairly we put Iodoflex on him last week however home health used Prisma 06/04/15 if anything the wound area appears to be larger. 06/11/15 switch to silver alginate last week and this seems to have helped. There is a thin layer of epithelialization only visible under the light Electronic Signature(s) Signed: 06/13/2015 7:59:30 AM By: Baltazar Najjar MD Entered By: Baltazar Najjar on 06/12/2015 05:22:24 ALIANYS, CHACKO ([id number]) -------------------------------------------------------------------------------- Physical Exam Details Patient Name: [name] Date of Service: 06/11/2015 2:15 PM Medical Record Patient Account Number: [id number] Number: Treating RN: Clover Mealy RN, BSN, Rita 1945/07/27 [phone] y.o. Other Clinician: Date of Birth/Sex: Female) Treating Avya Flavell Primary Care Physician/Extender: Kerry Kass, [facility] Physician: Referring Physician: Sherrie Mustache Weeks in Treatment: 15 Notes Wound exam. Her edema is well controlled. There is no acute ischemia and no evidence of infection. The wound bed appears healthy and under  the light appears to be fully or almost fully epithelialized. Worthwhile noting that this was really not visible until we put this under the light Electronic Signature(s) Signed: 06/13/2015 7:59:30 AM By: Baltazar Najjar MD Entered By: Baltazar Najjar on 06/12/2015 05:23:13 NASRIN, LANZO ([id number]) -------------------------------------------------------------------------------- Physician Orders Details Patient Name: [name] Date of Service: 06/11/2015 2:15 PM Medical Record Patient Account Number: [id number] Number: Treating RN: Clover Mealy RN, BSN, Rita 09/27/1945 [phone] y.o. Other Clinician: Date of Birth/Sex: Female) Treating Berthold Glace Primary Care Physician/Extender: Kerry Kass, [facility] Physician: Referring Physician: Augustin Coupe in Treatment: 15 Verbal / Phone Orders: Yes Clinician: Afful, RN, BSN, Rita Read Back and Verified: Yes Diagnosis Coding Wound Cleansing Wound #2 Left,Medial Lower Leg o Cleanse wound with mild soap and water Skin Barriers/Peri-Wound Care Wound #2 Left,Medial Lower Leg o Barrier cream o Moisturizing lotion Primary Wound Dressing Wound #2 Left,Medial Lower Leg o Aquacel Ag Secondary Dressing Wound #2 Left,Medial Lower Leg o Dry Gauze Dressing Change Frequency Wound #2 Left,Medial Lower Leg o Change dressing every week Follow-up Appointments Wound #2 Left,Medial Lower Leg o Return Appointment in 1 week. Edema Control Wound #2 Left,Medial Lower Leg o 3 Layer Compression System - Left Lower Extremity Electronic Signature(s) Signed: 06/11/2015 4:09:11 PM By: Elpidio Eric BSN, RN [address] [name] ([id number]) Signed: 06/13/2015 7:59:30 AM By: Baltazar Najjar MD Entered By: Elpidio Eric on 06/11/2015 15:03:40 Dalpe, [name] ([id number]) -------------------------------------------------------------------------------- Problem List Details Patient Name: [name] Date of Service: 06/11/2015 2:15 PM Medical  Record Patient Account Number: [id number] Number: Treating RN: Clover Mealy RN, BSN, Rita Aug 25, 1945 [phone] y.o. 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 Other Clinician: Date of Birth/Sex: Female) Treating Naudia Crosley Primary Care Physician/Extender: Kerry Kass, [facility] Physician: Referring Physician: Sherrie Mustache Weeks in Treatment: 15 Subjective Chief Complaint Information obtained from Patient Left calf ulcerations. Arterial insufficiency. History of Present Illness (HPI) Pleasant 70 year old with diabetes (no hemoglobin A1c available) and severe peripheral vascular disease. She reports a prior left lower extremity stent. Records unavailable. Underwent multiple level angioplasty of left lower extremity 10/29/2014 by Dr. Wyn Quaker. Developed left calf ulcerations in late December 2016. Denies any trauma. Seen by her PCP.  Bactrim prescribed. Referred to the wound clinic. Biopsy culture 02/20/2015 grew methicillin sensitive staph aureus, sensitive to Bactrim. Performing dressing changes with Prisma. Using a Tubigrip for edema control. Scheduled to see Dr. Wyn Quaker in follow-up 02/25/2015. Cancelled because of weather. Rescheduled for 03/06/2015. She returns to clinic for follow-up and is without new complaints. Pain improved. No ischemic rest pain. No fever or chills. Minimal drainage. 03/13/2015 -- records obtained from [facility] shows that she was seen by Dr. dew on 03/12/2015 and given tramadol and other symptomatic treatment. Recent ABI done showed right to be 0.94 and left to be 0.47 a left lower extremity arterial duplex was notable for an occluded stent and distal ATA. Recommendations were for left lower extremity angiogram with intervention. on 03/11/2015 she was taken up for a aortogram with angioplasty of the left posterior tibial artery and tibioperoneal trunk, above-knee popliteal artery and almost entire SFA, stent placement to the SFA and popliteal artery. 03/20/15 her arterial interventions noted. The patient states her left leg feels better. He ischemic wound on her left anterior leg. She has been using Santyl 04/09/15; the patient arrives back today. 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 dew. I was concerned about her last week as she had weeping edema fluid and areas of threatened ulceration around her wound. I ordered a silver alginate, Profore light wrap. She comes back today with an Radio broadcast assistant on apparently applied by Dr. Driscilla Grammes office. Her edema is under much better control and the weeping area here last week surrounding her major wound has resolved. She does have another open area which is small and superficial. I'm not sure I would put an Unna boot on this lady's degree of arterial insufficiency. 04/16/15; her original small  wound appears to be healthy with good granulation her measurements are improved. She has a small area underneath this. Both underwent selective debridement since surface Garlow, Shataria N. ([id number]) slough 04/23/15; the original wound has filled in there is healthy granulation here. The small area underneath this is healed. No debridement was required 04/30/15; the wound no longer has any depth. no Debridement required 05/07/15; the wound appears to be stable. Advancing epithelialization. Decrease wound volume. No debridement is required. She has known arterial disease status post stent placement to the SFA and popliteal artery. 05/14/15 unfortunately the wound is not really decreased much this week she still has a rim of epithelialization. The wound bed looks stable nevertheless I did a surgical debridement  and there is some adherent surface slough. She has known PAD however the blood supply appears to be adequate status post stent to the SFA and popliteal and popliteal artery 05/21/15 wound is healthier than last week. No debridement is necessary. Known PAD status post stent to the SFA and popliteal artery 05/28/15; wound is no different from last week and in fact looks "stalled". Fairly we put Iodoflex on him last week however home health used Prisma 06/04/15 if anything the wound area appears to be larger. 06/11/15 switch to silver alginate last week and this seems to have helped. There is a thin layer of epithelialization only visible under the light Objective Constitutional Vitals Time Taken: 2:28 PM, Height: 67 in, Weight: 302 lbs, BMI: 47.3, Temperature: 97.8 F, Pulse: 83 bpm, Respiratory Rate: 17 breaths/min, Blood Pressure: 147/49 mmHg. Integumentary (Hair, Skin) Wound #2 status is Open. Original cause of wound was Blister. The wound is located on the Left,Medial Lower Leg. The wound measures 0.6cm length x 0.6cm width x 0.1cm depth; 0.283cm^2 area and 0.028cm^3 volume. The wound is  limited to skin breakdown. There is no tunneling or undermining noted. There is a small amount of serosanguineous drainage noted. The wound margin is flat and intact. There is large (67-100%) pink, pale granulation within the wound bed. There is no necrotic tissue within the wound bed. The periwound skin appearance exhibited: Localized Edema, Moist. The periwound skin appearance did not exhibit: Callus, Crepitus, Excoriation, Fluctuance, Friable, Induration, Rash, Scarring, Dry/Scaly, Maceration, Atrophie Blanche, Cyanosis, Ecchymosis, Hemosiderin Staining, Mottled, Pallor, Rubor, Erythema. Periwound temperature was noted as No Abnormality. The periwound has tenderness on palpation. 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 Edema Control: Wound #2 Left,Medial Lower Leg: 3 Layer Compression System - Left Lower Extremity #1 continue with Aquacel Ag under Profore's Electronic Signature(s) Signed: 06/13/2015 7:59:30 AM By: Baltazar Najjar MD [place], [name] ([id number]) Entered By: Baltazar Najjar on 06/12/2015 05:23:40 Fanfan, [name]  ([id number]) -------------------------------------------------------------------------------- SuperBill Details Patient Name: [name] Date of Service: 06/11/2015 Medical Record Patient Account Number: [id number] Number: Treating RN: Clover Mealy RN, BSN, Rita 25-May-1945 [phone] y.o. 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, left I70.322 leg Facility Procedures CPT4: Description Modifier Quantity Code [id number] (Facility Use Only) 29528UX - APPLY MULTLAY COMPRS LWR LT 1 LEG Physician Procedures CPT4: Description Modifier Quantity Code 3244010 27253 - WC PHYS LEVEL 2 - EST PT 1 ICD-10  Description Diagnosis 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

## 2015-06-18 ENCOUNTER — Encounter: Payer: Medicare Other | Attending: Internal Medicine | Admitting: Internal Medicine

## 2015-06-18 DIAGNOSIS — L97221 Non-pressure chronic ulcer of left calf limited to breakdown of skin: Secondary | ICD-10-CM | POA: Insufficient documentation

## 2015-06-18 DIAGNOSIS — M1990 Unspecified osteoarthritis, unspecified site: Secondary | ICD-10-CM | POA: Diagnosis not present

## 2015-06-18 DIAGNOSIS — I70342 Atherosclerosis of unspecified type of bypass graft(s) of the left leg with ulceration of calf: Secondary | ICD-10-CM | POA: Diagnosis not present

## 2015-06-18 DIAGNOSIS — I70322 Atherosclerosis of unspecified type of bypass graft(s) of the extremities with rest pain, left leg: Secondary | ICD-10-CM | POA: Diagnosis not present

## 2015-06-18 DIAGNOSIS — E11622 Type 2 diabetes mellitus with other skin ulcer: Secondary | ICD-10-CM | POA: Diagnosis not present

## 2015-06-18 DIAGNOSIS — I10 Essential (primary) hypertension: Secondary | ICD-10-CM | POA: Diagnosis not present

## 2015-06-18 DIAGNOSIS — L97821 Non-pressure chronic ulcer of other part of left lower leg limited to breakdown of skin: Secondary | ICD-10-CM | POA: Diagnosis not present

## 2015-06-18 DIAGNOSIS — I739 Peripheral vascular disease, unspecified: Secondary | ICD-10-CM | POA: Diagnosis not present

## 2015-06-18 DIAGNOSIS — E1140 Type 2 diabetes mellitus with diabetic neuropathy, unspecified: Secondary | ICD-10-CM | POA: Diagnosis not present

## 2015-06-18 DIAGNOSIS — I70248 Atherosclerosis of native arteries of left leg with ulceration of other part of lower left leg: Secondary | ICD-10-CM | POA: Diagnosis not present

## 2015-06-19 NOTE — Progress Notes (Signed)
ROE, KOFFMAN ([id number]) Visit Report for 06/18/2015 Arrival Information Details Patient Name: [name], [name] Date of Service: 06/18/2015 1:30 PM Medical Record Patient Account Number: [id number] Number: Treating RN: Clover Mealy, RN, BSN, Rita [birth year] [phone] y.o. Other Clinician: Date of Birth/Sex: Female) Treating ROBSON, MICHAEL Primary Care Physician: Sherrie Mustache Physician/Extender: G Referring Physician: Sherrie Mustache Weeks in Treatment: 16 Visit Information History Since Last Visit Added or deleted any medications: No Patient Arrived: Wheel Chair Any new allergies or adverse reactions: No Arrival Time: 13:51 Had a fall or experienced change in No Accompanied By: friend activities of daily living that may affect Transfer Assistance: None risk of falls: Patient Identification Verified: Yes Signs or symptoms of abuse/neglect since last No Secondary Verification Process Yes visito Completed: Hospitalized since last visit: No Patient Requires Transmission- No Has Dressing in Place as Prescribed: Yes Based Precautions: Has Compression in Place as Prescribed: Yes Patient Has Alerts: Yes Pain Present Now: No Patient Alerts: Patient on Blood Thinner plavix, aspirin  DMII ABI right 0.94 ABI left 1.16 Electronic Signature(s) Signed: 06/18/2015 5:12:30 PM By: Elpidio Eric BSN, RN Entered By: Elpidio Eric on 06/18/2015 13:52:45 Litzau, [name] ([id number]) -------------------------------------------------------------------------------- Encounter Discharge Information Details Patient Name: [name] Date of Service: 06/18/2015 1:30 PM Medical Record Patient Account Number: [id number] Number: Treating RN: Clover Mealy, RN, BSN, Rita 12/31/1945 [phone] y.o. Other Clinician: Date of Birth/Sex: Female) Treating ROBSON, MICHAEL Primary Care Physician: Sherrie Mustache Physician/Extender: G Referring Physician: Sherrie Mustache Weeks in Treatment: 16 Encounter Discharge  Information Items Discharge Pain Level: 0 Discharge Condition: Stable Ambulatory Status: Wheelchair Discharge Destination: Home Transportation: Private Auto Accompanied By: friend Schedule Follow-up Appointment: No Medication Reconciliation completed No and provided to Patient/Care [name]: Provided on Clinical Summary of Care: 06/18/2015 Form Type Recipient Paper Patient MW Electronic Signature(s) Signed: 06/18/2015 5:12:30 PM By: Elpidio Eric BSN, RN Previous Signature: 06/18/2015 2:26:42 PM Version By: Gwenlyn Perking Entered By: Elpidio Eric on 06/18/2015 14:28:43 Lanum, [name] ([id number]) -------------------------------------------------------------------------------- Lower Extremity Assessment Details Patient Name: [name] Date of Service: 06/18/2015 1:30 PM Medical Record Patient Account Number: [id number] Number: Treating RN: Clover Mealy, RN, BSN, Rita 1946-02-16 [phone] y.o. Other Clinician: Date of Birth/Sex: Female) Treating ROBSON, MICHAEL Primary Care Physician: Sherrie Mustache Physician/Extender: G Referring Physician: Sherrie Mustache Weeks in Treatment: 16 Vascular Assessment Pulses: Posterior Tibial Dorsalis Pedis Palpable: [Left:Yes] Extremity colors, hair growth, and conditions: Extremity Color: [Left:Normal] Hair Growth on Extremity: [Left:Yes] Temperature of Extremity: [Left:Warm] Capillary Refill: [Left:< 3 seconds] Toe Nail Assessment Left: Right: Thick: Yes Discolored: No Deformed: No Improper Length and Hygiene: Yes Electronic Signature(s) Signed: 06/18/2015 5:12:30 PM By: Elpidio Eric BSN, RN Entered By: Elpidio Eric on 06/18/2015 13:54:54 Dewolfe, [name] ([id number]) -------------------------------------------------------------------------------- Multi Wound Chart Details Patient Name: [name] Date of Service: 06/18/2015 1:30 PM Medical Record Patient Account Number: [id number] Number: Treating RN: Clover Mealy, RN, BSN,  Rita 1945-12-11 [phone] y.o. 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] [N/A:N/A] Comorbid History: [2:Hypertension, Peripheral Hypertension, Peripheral Arterial Disease, Type II Arterial Disease, Type II Diabetes, Osteoarthritis, Diabetes,  Osteoarthritis, Neuropathy] [4:Neuropathy] [N/A:N/A] Date Acquired: [2:01/28/2015] [4:06/18/2015] [N/A:N/A] Weeks of Treatment: [2:16] [4:0] [N/A:N/A] Wound Status: [2:Open] [4:Open] [N/A:N/A] Measurements L x W x D 0.5x0.5x0.1 [4:3x1.5x0.1] [N/A:N/A] (cm) Area (cm) : [2:0.196] [4:3.534] [N/A:N/A] Volume (cm) : [2:0.02] [4:0.353] [N/A:N/A] % Reduction in Area: [2:65.30%] [4:N/A] [N/A:N/A] % Reduction in Volume: 82.30% [4:N/A] [N/A:N/A] Classification: [2:Full Thickness Without Exposed Support Structures] [4:Grade 1] [N/A:N/A] HBO Classification: [2:Grade 1] [4:N/A] [N/A:N/A] Exudate Amount: [2:Small] [4:Medium] [N/A:N/A] Exudate Type: [2:Serosanguineous] [4:Serosanguineous] [N/A:N/A] Exudate Color: [2:red, brown] [4:red, brown] [N/A:N/A] Wound Margin: [2:Flat and Intact] [4:Distinct, outline attached] [N/A:N/A] Granulation Amount: [2:Large (67-100%)] [4:Large (67-100%)] [N/A:N/A] Granulation Quality: [2:Pink, Pale] [4:Pink, Pale] [N/A:N/A] Necrotic Amount: None Present (0%) None Present (0%) N/A Exposed Structures: Fascia: No Fascia: No N/A Fat: No Fat:  No Tendon: No Tendon: No Muscle: No Muscle: No Joint: No Joint: No Bone: No Bone: No Limited to Skin Limited to Skin Breakdown Breakdown Epithelialization: Medium (34-66%) None N/A Periwound Skin Texture: Edema: Yes Edema: Yes N/A Excoriation: No Excoriation: No Induration: No Induration: No Callus: No Callus: No Crepitus: No Crepitus: No Fluctuance: No Fluctuance: No Friable: No Friable: No Rash: No Rash: No Scarring: No Scarring: No Periwound Skin Moist: Yes Moist: Yes N/A Moisture: Maceration: No Maceration: No Dry/Scaly: No Dry/Scaly: No Periwound Skin Color: Atrophie Blanche: No Atrophie Blanche: No N/A Cyanosis: No Cyanosis: No Ecchymosis: No Ecchymosis: No Erythema: No Erythema: No Hemosiderin Staining: No Hemosiderin Staining: No Mottled: No Mottled: No Pallor: No Pallor: No Rubor: No Rubor: No Temperature: No Abnormality No Abnormality N/A Tenderness on Yes Yes N/A Palpation: Wound Preparation: Ulcer Cleansing: Ulcer Cleansing: Other: N/A Rinsed/Irrigated with water and soap Saline, Other: soap and water Topical Anesthetic Applied: None Topical Anesthetic Applied: None Treatment Notes Electronic Signature(s) Signed: 06/18/2015 5:12:30 PM By: Elpidio EricAfful, Rita BSN, RN Entered By: Elpidio EricAfful, Rita on 06/18/2015 14:12:33 [name] InfanteWOODS, Mouna N. ([id number]) -------------------------------------------------------------------------------- Multi-Disciplinary Care Plan Details Patient Name: [name] InfanteWOODS, Sukhman N. Date of Service: 06/18/2015 1:30 PM Medical Record Patient Account Number: [id number] Number: Treating RN: Clover MealyAfful, RN, BSN, Rita 01/29/1946 [phone](70 y.o. Other Clinician: Date of Birth/Sex: Female) Treating ROBSON, MICHAEL Primary Care Physician: Sherrie MustacheJADALI, FAYEGH Physician/Extender: G Referring Physician: Sherrie MustacheJADALI, FAYEGH Weeks in Treatment: 16 Active Inactive Abuse / Safety / Falls / Self Care Management Nursing Diagnoses: Potential for  falls Goals: Patient will remain injury free Date Initiated: 02/20/2015 Goal Status: Active Interventions: Assess fall risk on admission and as needed Notes: Nutrition Nursing Diagnoses: Potential for alteratiion in Nutrition/Potential for imbalanced nutrition Goals: Patient/caregiver will maintain therapeutic glucose control Date Initiated: 02/20/2015 Goal Status: Active Interventions: Assess patient nutrition upon admission and as needed per policy Notes: Orientation to the Wound Care Program Nursing Diagnoses: Knowledge deficit related to the wound healing center program Goals: [name] InfanteWOODS, Marcie N. ([id number]) Patient/caregiver will verbalize understanding of the Wound Healing Center Program Date Initiated: 02/20/2015 Goal Status: Active Interventions: Provide education on orientation to the wound center Notes: Wound/Skin Impairment Nursing Diagnoses: Impaired tissue integrity Goals: Ulcer/skin breakdown will heal within 14 weeks Date Initiated: 02/20/2015 Goal Status: Active Interventions: Assess ulceration(s) every visit Notes: Electronic Signature(s) Signed: 06/18/2015 5:12:30 PM By: Elpidio EricAfful, Rita BSN, RN Entered By: Elpidio EricAfful, Rita on 06/18/2015 14:10:17 [name] InfanteWOODS, Nicholas N. ([id number]) -------------------------------------------------------------------------------- Pain Assessment Details Patient Name: [name] InfanteWOODS, Talullah N. Date of Service: 06/18/2015 1:30 PM Medical Record Patient Account Number: [id number] Number: Treating RN: Clover MealyAfful, RN, BSN, Rita 01/29/1946 [phone](70 y.o. Other Clinician: Date of Birth/Sex: Female) Treating ROBSON, MICHAEL Primary Care Physician: Sherrie MustacheJADALI, FAYEGH Physician/Extender: G Referring Physician: Dario GuardianJADALI,  FAYEGH Weeks in Treatment: 16 Active Problems Location of Pain Severity and Description of Pain Patient Has Paino No Site Locations Pain Management and Medication Current Pain Management: Electronic Signature(s) Signed: 06/18/2015 5:12:30 PM By: Elpidio Eric BSN, RN Entered By: Elpidio Eric on 06/18/2015 13:52:51 Eyster, [name] ([id number]) -------------------------------------------------------------------------------- Patient/Caregiver Education Details Patient Name: [name] Date of Service: 06/18/2015 1:30 PM Medical Record Patient Account Number: [id number] Number: Treating RN: Clover Mealy, RN, BSN, Rita May 01, 1945 [phone] y.o. Other Clinician: Date of Birth/Gender: Female) Treating ROBSON, MICHAEL Primary Care Physician: Sherrie Mustache Physician/Extender: G Referring Physician: Augustin Coupe in Treatment: 16 Education Assessment Education Provided To: Patient Education Topics Provided Welcome To The Wound Care Center: Methods: Explain/Verbal Responses: State content correctly Wound Debridement: Methods: Explain/Verbal Responses: State content correctly Electronic Signature(s) Signed: 06/18/2015 5:12:30 PM By: Elpidio Eric BSN, RN Entered By: Elpidio Eric on 06/18/2015 14:29:02 [name] ([id number]) -------------------------------------------------------------------------------- Wound Assessment Details Patient Name: [name] Date of Service: 06/18/2015 1:30 PM Medical Record Patient Account Number: [id number] Number: Treating RN: Clover Mealy RN, BSN, Rita 10-29-1945 [phone] y.o. Other Clinician: Date of Birth/Sex: Female) Treating ROBSON, MICHAEL Primary Care Physician: Sherrie Mustache Physician/Extender: G Referring Physician: Sherrie Mustache Weeks in Treatment: 16 Wound Status Wound Number: 2 Primary Arterial Insufficiency Ulcer Etiology: Wound Location: Left Lower Leg - Medial Wound Open Wounding Event: Blister Status: Date Acquired: 01/28/2015 Comorbid Hypertension, Peripheral Arterial Weeks Of Treatment: 16 History: Disease, Type II Diabetes, Clustered Wound: No Osteoarthritis, Neuropathy Photos Photo Uploaded By: Elpidio Eric on 06/18/2015 16:37:46 Wound Measurements Length: (cm)  0.5 Width: (cm) 0.5 Depth: (cm) 0.1 Area: (cm) 0.196 Volume: (cm) 0.02 % Reduction in Area: 65.3% % Reduction in Volume: 82.3% Epithelialization: Medium (34-66%) Tunneling: No Undermining: No Wound Description Full Thickness Without Exposed Foul Odor Classification: Support Structures Fehring, Linzi N. ([id number]) After Cleansing: No Diabetic Severity Grade 1 (Wagner): Wound Margin: Flat and Intact Exudate Amount: Small Exudate Type: Serosanguineous Exudate Color: red, brown Wound Bed Granulation Amount: Large (67-100%) Exposed Structure Granulation Quality: Pink, Pale Fascia Exposed: No Necrotic Amount: None Present (0%) Fat Layer Exposed: No Tendon Exposed: No Muscle Exposed: No Joint Exposed: No Bone Exposed: No Limited to Skin Breakdown Periwound Skin Texture Texture Color No Abnormalities Noted: No No Abnormalities Noted: No Callus: No Atrophie Blanche: No Crepitus: No Cyanosis: No Excoriation: No Ecchymosis: No Fluctuance: No Erythema: No Friable: No Hemosiderin Staining: No Induration: No Mottled: No Localized Edema: Yes Pallor: No Rash: No Rubor: No Scarring: No Temperature / Pain Moisture Temperature: No Abnormality No Abnormalities Noted: No Tenderness on Palpation: Yes Dry / Scaly: No Maceration: No Moist: Yes Wound Preparation Ulcer Cleansing: Rinsed/Irrigated with Saline, Other: soap and water, Topical Anesthetic Applied: None Treatment Notes Wound #2 (Left, Medial Lower Leg) 1. Cleansed with: Cleanse wound with antibacterial soap and water 3. Peri-wound Care: Barrier cream Moisturizing lotion Saefong, Neelam N. ([id number]) 4. Dressing Applied: Aquacel Ag 5. Secondary Dressing Applied Dry Gauze 7. Secured with 3 Layer Compression System - Left Lower Extremity Electronic Signature(s) Signed: 06/18/2015 5:12:30 PM By: Elpidio Eric BSN, RN Entered By: Elpidio Eric on 06/18/2015 14:12:21 SHARYON, PEITZ  ([id number]) -------------------------------------------------------------------------------- Wound Assessment Details Patient Name: [name] Date of Service: 06/18/2015 1:30 PM Medical Record Patient Account Number: [id number] Number: Treating RN: Clover Mealy, RN, BSN, Rita 07-07-1945 [phone] y.o. Other Clinician: Date of Birth/Sex: Female) Treating ROBSON, MICHAEL Primary Care Physician: Sherrie Mustache Physician/Extender: G Referring Physician: Sherrie Mustache Weeks in Treatment: 16 Wound Status  Wound Number: 4 Primary Diabetic Wound/Ulcer of the Lower Etiology: Extremity Wound Location: Left Lower Leg - Proximal Wound Open Wounding Event: Trauma Status: Date Acquired: 06/18/2015 Comorbid Hypertension, Peripheral Arterial Weeks Of Treatment: 0 History: Disease, Type II Diabetes, Clustered Wound: No Osteoarthritis, Neuropathy Photos Photo Uploaded By: Elpidio Eric on 06/18/2015 16:38:06 Wound Measurements Length: (cm) 3 Width: (cm) 1.5 Depth: (cm) 0.1 Area: (cm) 3.534 Volume: (cm) 0.353 % Reduction in Area: % Reduction in Volume: Epithelialization: None Tunneling: No Undermining: No Wound Description Classification: Grade 1 Wound Margin: Distinct, outline attached Exudate Amount: Medium Exudate Type: Serosanguineous Exudate Color: red, brown Foul Odor After Cleansing: No Wound Bed Granulation Amount: Large (67-100%) Exposed Structure Granulation Quality: Pink, Pale Fascia Exposed: No Haran, Ryliee N. ([id number]) Necrotic Amount: None Present (0%) Fat Layer Exposed: No Tendon Exposed: No Muscle Exposed: No Joint Exposed: No Bone Exposed: No Limited to Skin Breakdown Periwound Skin Texture Texture Color No Abnormalities Noted: No No Abnormalities Noted: No Callus: No Atrophie Blanche: No Crepitus: No Cyanosis: No Excoriation: No Ecchymosis: No Fluctuance: No Erythema: No Friable: No Hemosiderin Staining: No Induration: No Mottled:  No Localized Edema: Yes Pallor: No Rash: No Rubor: No Scarring: No Temperature / Pain Moisture Temperature: No Abnormality No Abnormalities Noted: No Tenderness on Palpation: Yes Dry / Scaly: No Maceration: No Moist: Yes Wound Preparation Ulcer Cleansing: Other: water and soap, Topical Anesthetic Applied: None Treatment Notes Wound #4 (Left, Proximal Lower Leg) 1. Cleansed with: Cleanse wound with antibacterial soap and water 3. Peri-wound Care: Barrier cream Moisturizing lotion 4. Dressing Applied: Aquacel Ag 5. Secondary Dressing Applied Dry Gauze 7. Secured with 3 Layer Compression System - Left Lower Extremity Electronic Signature(s) Signed: 06/18/2015 5:12:30 PM By: Elpidio Eric BSN, RN Entered By: Elpidio Eric on 06/18/2015 14:04:23 KRISANNE, LICH ([id number]) DANEYA, HARTGROVE ([id number]) -------------------------------------------------------------------------------- Vitals Details Patient Name: [name] Date of Service: 06/18/2015 1:30 PM Medical Record Patient Account Number: [id number] Number: Treating RN: Clover Mealy, RN, BSN, Rita 14-May-1945 [phone] y.o. Other Clinician: Date of Birth/Sex: Female) Treating ROBSON, MICHAEL Primary Care Physician: Sherrie Mustache Physician/Extender: G Referring Physician: Sherrie Mustache Weeks in Treatment: 16 Vital Signs Time Taken: 13:52 Temperature (F): 97.9 Height (in): 67 Pulse (bpm): 84 Weight (lbs): 302 Respiratory Rate (breaths/min): 16 Body Mass Index (BMI): 47.3 Blood Pressure (mmHg): 143/55 Reference Range: 80 - 120 mg / dl Electronic Signature(s) Signed: 06/18/2015 5:12:30 PM By: Elpidio Eric BSN, RN Entered By: Elpidio Eric on 06/18/2015 13:53:42

## 2015-06-19 NOTE — Progress Notes (Signed)
[name], [name] ([id number]) Visit Report for 06/18/2015 Chief Complaint Document Details Patient Name: [name], [name] Date of Service: 06/18/2015 1:30 PM Medical Record Patient Account Number: [id number] Number: Treating RN: Clover Mealy, RN, BSN, Rita Sep 30, 1945 [phone] y.o. Other Clinician: Date of Birth/Sex: Female) Treating ROBSON, MICHAEL Primary Care Physician/Extender: Kerry Kass, [facility] Physician: Referring Physician: Sherrie Mustache Weeks in Treatment: 16 Information Obtained from: Patient Chief Complaint Left calf ulcerations. Arterial insufficiency. Electronic Signature(s) Signed: 06/19/2015 8:00:31 AM By: Baltazar Najjar MD Entered By: Baltazar Najjar on 06/18/2015 15:26:24 [name], [name] ([id number]) -------------------------------------------------------------------------------- HPI Details Patient Name: [name] Date of Service: 06/18/2015 1:30 PM Medical Record Patient Account Number: [id number] Number: Treating RN: Clover Mealy RN, BSN, Rita [birth year] [phone] y.o. Other Clinician: Date of Birth/Sex: Female) Treating ROBSON, MICHAEL Primary Care Physician/Extender: Kerry Kass, [facility] Physician: Referring Physician: Sherrie Mustache Weeks in Treatment: 16 History of Present Illness HPI Description: Pleasant 70 year old with diabetes (no hemoglobin A1c available) and severe peripheral vascular disease. She reports a prior left lower extremity stent. Records unavailable. Underwent multiple level angioplasty of left lower extremity 10/29/2014 by Dr. Wyn Quaker. Developed left calf ulcerations in late December 2016. Denies any trauma. Seen by her PCP. Bactrim prescribed. Referred to the wound clinic. Biopsy culture 02/20/2015 grew methicillin sensitive staph aureus, sensitive to Bactrim. Performing dressing changes with Prisma. Using a Tubigrip for edema control. Scheduled to see Dr. Wyn Quaker in follow-up 02/25/2015. Cancelled because of weather. Rescheduled for 03/06/2015. She  returns to clinic for follow-up and is without new complaints. Pain improved. No ischemic rest pain. No fever or chills. Minimal drainage. 03/13/2015 -- records obtained from [facility] shows that she was seen by Dr. dew on 03/12/2015 and given tramadol and other symptomatic treatment. Recent ABI done showed right to be 0.94 and left to be 0.47 a left lower extremity arterial duplex was notable for an occluded stent and distal ATA. Recommendations were for left lower extremity angiogram with intervention. on 03/11/2015 she was taken up for a aortogram with angioplasty of the left posterior tibial artery and tibioperoneal trunk, above-knee popliteal artery and almost entire SFA, stent placement to the SFA and popliteal artery. 03/20/15 her arterial interventions noted. The patient states her left leg feels better. He ischemic wound on her left anterior leg. She has been using Santyl 04/09/15; the patient arrives back today. 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 dew. I was concerned about her last week as she had weeping edema fluid and areas of threatened ulceration around her wound. I ordered a silver alginate, Profore light wrap. She comes back today with an Radio broadcast assistant on apparently applied by Dr. Driscilla Grammes office. Her edema is under much better control and the weeping area here last week surrounding her major wound has resolved. She does have another open area which is small and superficial. I'm not sure I would put an Unna boot on this lady's degree of arterial insufficiency. 04/16/15; her original small wound appears to be healthy with good granulation her measurements are improved. She has a small area underneath this. Both underwent selective debridement since surface slough 04/23/15; the original wound has filled in there is healthy granulation here. The small area underneath this is healed. No debridement was required 04/30/15;  the wound no longer has any depth. no Debridement required 05/07/15; the wound appears to be stable. Advancing epithelialization. Decrease wound volume. No Sandler, Karess N. ([id number]) debridement is required. She has known arterial disease  status post stent placement to the SFA and popliteal artery. 05/14/15 unfortunately the wound is not really decreased much this week she still has a rim of epithelialization. The wound bed looks stable nevertheless I did a surgical debridement and there is some adherent surface slough. She has known PAD however the blood supply appears to be adequate status post stent to the SFA and popliteal and popliteal artery 05/21/15 wound is healthier than last week. No debridement is necessary. Known PAD status post stent to the SFA and popliteal artery 05/28/15; wound is no different from last week and in fact looks "stalled". Fairly we put Iodoflex on him last week however home health used Prisma 06/04/15 if anything the wound area appears to be larger. 06/11/15 switch to silver alginate last week and this seems to have helped. There is a thin layer of epithelialization only visible under the light 06/18/15; the patient had her leg traumatized when a grandchild fell over her leg at a party last weekend. Her original wound is just about closed very tiny however she has a new larger superficial wound just distal to the original wound. No evidence of infection Electronic Signature(s) Signed: 06/19/2015 8:00:31 AM By: Baltazar Najjar MD Entered By: Baltazar Najjar on 06/18/2015 15:27:56 [name], [name] ([id number]) -------------------------------------------------------------------------------- Physical Exam Details Patient Name: [name] Date of Service: 06/18/2015 1:30 PM Medical Record Patient Account Number: [id number] Number: Treating RN: Clover Mealy RN, BSN, Rita Jul 22, 1945 [phone] y.o. Other Clinician: Date of Birth/Sex: Female) Treating ROBSON, MICHAEL Primary  Care Physician/Extender: Kerry Kass, [facility] Physician: Referring Physician: Sherrie Mustache Weeks in Treatment: 16 Notes Wound exam; her edema is well controlled. Her original small wound is even better. No debridement was required. This secondary superficial wound also did not call for debridement Electronic Signature(s) Signed: 06/19/2015 8:00:31 AM By: Baltazar Najjar MD Entered By: Baltazar Najjar on 06/18/2015 15:31:45 Lore, [name] ([id number]) -------------------------------------------------------------------------------- Physician Orders Details Patient Name: [name] Date of Service: 06/18/2015 1:30 PM Medical Record Patient Account Number: [id number] Number: Treating RN: Clover Mealy RN, BSN, Rita Apr 13, 1945 [phone] y.o. Other Clinician: Date of Birth/Sex: Female) Treating ROBSON, MICHAEL Primary Care Physician/Extender: Kerry Kass, [facility] Physician: Referring Physician: Augustin Coupe in Treatment: 31 Verbal / Phone Orders: Yes Clinician: Afful, RN, BSN, Rita Read Back and Verified: Yes Diagnosis Coding Wound Cleansing Wound #2 Left,Medial Lower Leg o Cleanse wound with mild soap and water Skin Barriers/Peri-Wound Care Wound #2 Left,Medial Lower Leg o Barrier cream o Moisturizing lotion Wound #4 Left,Proximal Lower Leg o Barrier cream o Moisturizing lotion Primary Wound Dressing Wound #2 Left,Medial Lower Leg o Aquacel Ag Wound #4 Left,Proximal Lower Leg o Aquacel Ag Secondary Dressing Wound #2 Left,Medial Lower Leg o Dry Gauze Wound #4 Left,Proximal Lower Leg o Dry Gauze Dressing Change Frequency Wound #2 Left,Medial Lower Leg o Change dressing every week Wound #4 Left,Proximal Lower Leg o Change dressing every week SECRET, KRISTENSEN. ([id number]) Follow-up Appointments Wound #2 Left,Medial Lower Leg o Return Appointment in 1 week. Wound #4 Left,Proximal Lower Leg o Return Appointment in 1 week. Edema Control Wound #2  Left,Medial Lower Leg o 3 Layer Compression System - Left Lower Extremity Wound #4 Left,Proximal Lower Leg o 3 Layer Compression System - Left Lower Extremity Electronic Signature(s) Signed: 06/18/2015 5:12:30 PM By: Elpidio Eric BSN, RN Signed: 06/19/2015 8:00:31 AM By: Baltazar Najjar MD Entered By: Elpidio Eric on 06/18/2015 14:14:00 Cliett, [name] ([id number]) -------------------------------------------------------------------------------- Problem List Details Patient Name: [name] Date of Service:  06/18/2015 1:30 PM Medical Record Patient Account Number: [id number] Number: Treating RN: Clover Mealy, RN, BSN, [place] 1945/04/19 [phone] y.o. 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 Other Clinician: Date of Birth/Sex: Female) Treating ROBSON, MICHAEL Primary Care Physician/Extender: Kerry Kass, [facility] Physician: Referring  Physician: Sherrie Mustache Weeks in Treatment: 16 Subjective Chief Complaint Information obtained from Patient Left calf ulcerations. Arterial insufficiency. History of Present Illness (HPI) Pleasant 70 year old with diabetes (no hemoglobin A1c available) and severe peripheral vascular disease. She reports a prior left lower extremity stent. Records unavailable. Underwent multiple level angioplasty of left lower extremity 10/29/2014 by Dr. Wyn Quaker. Developed left calf ulcerations in late December 2016. Denies any trauma. Seen by her PCP. Bactrim prescribed. Referred to the wound clinic. Biopsy culture 02/20/2015 grew methicillin sensitive staph aureus, sensitive to Bactrim. Performing dressing changes with Prisma. Using a Tubigrip for edema control. Scheduled to see Dr. Wyn Quaker in follow-up 02/25/2015. Cancelled because of weather. Rescheduled for 03/06/2015. She returns to clinic for follow-up and is without new complaints. Pain improved. No ischemic rest pain. No fever or chills. Minimal drainage. 03/13/2015 -- records obtained from [facility] shows that she was seen by Dr. dew on 03/12/2015 and given tramadol and other symptomatic treatment. Recent ABI done showed right to be 0.94 and left to be 0.47 a left lower extremity arterial duplex was notable for an occluded stent and distal ATA. Recommendations were for left lower extremity angiogram with intervention. on 03/11/2015 she was taken up for a aortogram with angioplasty of the left posterior tibial artery and tibioperoneal trunk, above-knee popliteal artery and almost entire SFA, stent placement to the SFA and popliteal artery. 03/20/15 her arterial interventions noted. The patient states her left leg feels better. He ischemic wound on her left anterior leg. She has been using Santyl 04/09/15; the patient arrives back today. 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 dew. I was  concerned about her last week as she had weeping edema fluid and areas of threatened ulceration around her wound. I ordered a silver alginate, Profore light wrap. She comes back today with an Radio broadcast assistant on apparently applied by Dr. Driscilla Grammes office. Her edema is under much better control and the weeping area here last week surrounding her major wound has resolved. She does have another open area which is small and superficial. I'm not sure I would put an Unna boot on this lady's degree of arterial insufficiency. 04/16/15; her original small wound appears to be healthy with good granulation her measurements are improved. She has a small area underneath this. Both underwent selective debridement since surface Constable, Evone N. ([id number]) slough 04/23/15; the original wound has filled in  there is healthy granulation here. The small area underneath this is healed. No debridement was required 04/30/15; the wound no longer has any depth. no Debridement required 05/07/15; the wound appears to be stable. Advancing epithelialization. Decrease wound volume. No debridement is required. She has known arterial disease status post stent placement to the SFA and popliteal artery. 05/14/15 unfortunately the wound is not really decreased much this week she still has a rim of epithelialization. The wound bed looks stable nevertheless I did a surgical debridement and there is some adherent surface slough. She has known PAD however the blood supply appears to be adequate status post stent to the SFA and popliteal and popliteal artery 05/21/15 wound is healthier than last week. No debridement is necessary. Known PAD status post stent to the SFA and popliteal artery 05/28/15; wound is no different from last week and in fact looks "stalled". Fairly we put Iodoflex on him last week however home health used Prisma 06/04/15 if anything the wound area appears to be larger. 06/11/15 switch to silver alginate last week and this seems to  have helped. There is a thin layer of epithelialization only visible under the light 06/18/15; the patient had her leg traumatized when a grandchild fell over her leg at a party last weekend. Her original wound is just about closed very tiny however she has a new larger superficial wound just distal to the original wound. No evidence of infection Objective Constitutional Vitals Time Taken: 1:52 PM, Height: 67 in, Weight: 302 lbs, BMI: 47.3, Temperature: 97.9 F, Pulse: 84 bpm, Respiratory Rate: 16 breaths/min, Blood Pressure: 143/55 mmHg. Integumentary (Hair, Skin) Wound #2 status is Open. Original cause of wound was Blister. The wound is located on the Left,Medial Lower Leg. The wound measures 0.5cm length x 0.5cm width x 0.1cm depth; 0.196cm^2 area and 0.02cm^3 volume. The wound is limited to skin breakdown. There is no tunneling or undermining noted. There is a small amount of serosanguineous drainage noted. The wound margin is flat and intact. There is large (67-100%) pink, pale granulation within the wound bed. There is no necrotic tissue within the wound bed. The periwound skin appearance exhibited: Localized Edema, Moist. The periwound skin appearance did not exhibit: Callus, Crepitus, Excoriation, Fluctuance, Friable, Induration, Rash, Scarring, Dry/Scaly, Maceration, Atrophie Blanche, Cyanosis, Ecchymosis, Hemosiderin Staining, Mottled, Pallor, Rubor, Erythema. Periwound temperature was noted as No Abnormality. The periwound has tenderness on palpation. Wound #4 status is Open. Original cause of wound was Trauma. The wound is located on the Left,Proximal Lower Leg. The wound measures 3cm length x 1.5cm width x 0.1cm depth; 3.534cm^2 area and 0.353cm^3 volume. The wound is limited to skin breakdown. There is no tunneling or undermining noted. VONCILLE, SIMM. ([id number]) There is a medium amount of serosanguineous drainage noted. The wound margin is distinct with the outline attached to  the wound base. There is large (67-100%) pink, pale granulation within the wound bed. There is no necrotic tissue within the wound bed. The periwound skin appearance exhibited: Localized Edema, Moist. The periwound skin appearance did not exhibit: Callus, Crepitus, Excoriation, Fluctuance, Friable, Induration, Rash, Scarring, Dry/Scaly, Maceration, Atrophie Blanche, Cyanosis, Ecchymosis, Hemosiderin Staining, Mottled, Pallor, Rubor, Erythema. Periwound temperature was noted as No Abnormality. The periwound has tenderness on palpation. 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 E11.622 - Type 2 diabetes mellitus with other skin ulcer I70.322 - Atherosclerosis of unspecified  type of bypass graft(s) of the extremities with rest pain, left leg Plan Wound Cleansing: Wound #2 Left,Medial Lower Leg: Cleanse wound with mild soap and water Skin Barriers/Peri-Wound Care: Wound #2 Left,Medial Lower Leg: Barrier cream Moisturizing lotion Wound #4 Left,Proximal Lower Leg: Barrier cream Moisturizing lotion Primary Wound Dressing: Wound #2 Left,Medial Lower Leg: Aquacel Ag Wound #4 Left,Proximal Lower Leg: Aquacel Ag Secondary Dressing: Wound #2 Left,Medial Lower Leg: Dry Gauze Wound #4 Left,Proximal Lower Leg: Dry Gauze Dressing Change Frequency: Wound #2 Left,Medial Lower Leg: Mellody LifeWOODS, Alvin N. ([id number]) Change dressing every week Wound #4 Left,Proximal Lower Leg: Change dressing every week Follow-up Appointments: Wound #2 Left,Medial Lower Leg: Return Appointment in 1 week. Wound #4 Left,Proximal Lower Leg: Return Appointment in 1 week. Edema Control: Wound #2 Left,Medial Lower Leg: 3 Layer Compression System - Left Lower Extremity Wound #4 Left,Proximal Lower Leg: 3 Layer Compression System - Left Lower Extremity We will continue the  Aquacel Ag now to both wound areas under a Profore light wrap Electronic Signature(s) Signed: 06/19/2015 8:00:31 AM By: Baltazar Najjarobson, Michael MD Entered By: Baltazar Najjarobson, Michael on 06/18/2015 15:32:20 Choy, [name] [name] ([id number]) -------------------------------------------------------------------------------- SuperBill Details Patient Name: [name] InfanteWOODS, Arihana N. Date of Service: 06/18/2015 Medical Record Patient Account Number: [id number] Number: Treating RN: Clover MealyAfful, RN, BSN, Rita [birth year] [phone](70 y.o. 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, left I70.322 leg Facility Procedures CPT4: Description Modifier Quantity Code [id number] (Facility Use Only) [id number] - APPLY MULTLAY COMPRS LWR LT 1 LEG Physician Procedures CPT4: Description Modifier Quantity Code [id number] 99212 - WC PHYS LEVEL 2 - EST PT 1 ICD-10 Description Diagnosis 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

## 2015-06-25 ENCOUNTER — Encounter: Payer: Medicare Other | Admitting: Internal Medicine

## 2015-06-25 DIAGNOSIS — E1140 Type 2 diabetes mellitus with diabetic neuropathy, unspecified: Secondary | ICD-10-CM | POA: Diagnosis not present

## 2015-06-25 DIAGNOSIS — I70342 Atherosclerosis of unspecified type of bypass graft(s) of the left leg with ulceration of calf: Secondary | ICD-10-CM | POA: Diagnosis not present

## 2015-06-25 DIAGNOSIS — I70248 Atherosclerosis of native arteries of left leg with ulceration of other part of lower left leg: Secondary | ICD-10-CM | POA: Diagnosis not present

## 2015-06-25 DIAGNOSIS — L97221 Non-pressure chronic ulcer of left calf limited to breakdown of skin: Secondary | ICD-10-CM | POA: Diagnosis not present

## 2015-06-25 DIAGNOSIS — I10 Essential (primary) hypertension: Secondary | ICD-10-CM | POA: Diagnosis not present

## 2015-06-25 DIAGNOSIS — M1990 Unspecified osteoarthritis, unspecified site: Secondary | ICD-10-CM | POA: Diagnosis not present

## 2015-06-25 DIAGNOSIS — E11622 Type 2 diabetes mellitus with other skin ulcer: Secondary | ICD-10-CM | POA: Diagnosis not present

## 2015-06-25 DIAGNOSIS — L97821 Non-pressure chronic ulcer of other part of left lower leg limited to breakdown of skin: Secondary | ICD-10-CM | POA: Diagnosis not present

## 2015-06-25 DIAGNOSIS — I739 Peripheral vascular disease, unspecified: Secondary | ICD-10-CM | POA: Diagnosis not present

## 2015-06-25 DIAGNOSIS — I70322 Atherosclerosis of unspecified type of bypass graft(s) of the extremities with rest pain, left leg: Secondary | ICD-10-CM | POA: Diagnosis not present

## 2015-06-25 NOTE — Progress Notes (Addendum)
[name], Shaletha N. ([id number]) Visit Report for 06/25/2015 Arrival Information Details Patient Name: [name], Jadeyn N. Date of Service: 06/25/2015 1:30 PM Medical Record Patient Account Number: [id number] Number: Treating RN: Clover MealyAfful, RN, BSN, Rita 1945-04-24 [phone](70 y.o. Other Clinician: Date of Birth/Sex: Female) Treating ROBSON, MICHAEL Primary Care Physician: Sherrie MustacheJADALI, FAYEGH Physician/Extender: G Referring Physician: Sherrie MustacheJADALI, FAYEGH Weeks in Treatment: 17 Visit Information History Since Last Visit Added or deleted any medications: No Patient Arrived: Wheel Chair Any new allergies or adverse reactions: No Arrival Time: 13:25 Had a fall or experienced change in No Accompanied By: nephew activities of daily living that may affect Transfer Assistance: None risk of falls: Patient Identification Verified: Yes Signs or symptoms of abuse/neglect since last No Secondary Verification Process Yes visito Completed: Hospitalized since last visit: No Patient Requires Transmission- No Has Dressing in Place as Prescribed: Yes Based Precautions: Has Compression in Place as Prescribed: Yes Patient Has Alerts: Yes Pain Present Now: No Patient Alerts: Patient on Blood Thinner plavix, aspirin 81mg  DMII ABI right 0.94 ABI left 1.16 Electronic Signature(s) Signed: 06/25/2015 1:25:38 PM By: Elpidio EricAfful, Rita BSN, RN Entered By: Elpidio EricAfful, Rita on 06/25/2015 13:25:37 [name], Alandra N. ([id number]) -------------------------------------------------------------------------------- Encounter Discharge Information Details Patient Name: [name], Audrinna N. Date of Service: 06/25/2015 1:30 PM Medical Record Patient Account Number: [id number] Number: Treating RN: Clover MealyAfful, RN, BSN, Rita 1945-04-24 [phone](70 y.o. Other Clinician: Date of Birth/Sex: Female) Treating ROBSON, MICHAEL Primary Care Physician: Sherrie MustacheJADALI, FAYEGH Physician/Extender: G Referring Physician: Sherrie MustacheJADALI, FAYEGH Weeks in Treatment: 7617 Encounter Discharge  Information Items Discharge Pain Level: 0 Discharge Condition: Stable Ambulatory Status: Wheelchair Discharge Destination: Home Transportation: Private Auto Accompanied By: grd dtr Schedule Follow-up Appointment: No Medication Reconciliation completed No and provided to Patient/Care Ara Grandmaison: Provided on Clinical Summary of Care: 06/25/2015 Form Type Recipient Paper Patient MW Electronic Signature(s) Signed: 06/25/2015 5:19:00 PM By: Elpidio EricAfful, Rita BSN, RN Previous Signature: 06/25/2015 2:05:55 PM Version By: Gwenlyn PerkingMoore, Shelia Entered By: Elpidio EricAfful, Rita on 06/25/2015 14:07:09 Ptacek, [name] ([id number]) -------------------------------------------------------------------------------- Lower Extremity Assessment Details Patient Name: [name], Monicia N. Date of Service: 06/25/2015 1:30 PM Medical Record Patient Account Number: [id number] Number: Treating RN: Clover MealyAfful, RN, BSN, Rita 1945-04-24 [phone](70 y.o. Other Clinician: Date of Birth/Sex: Female) Treating ROBSON, MICHAEL Primary Care Physician: Sherrie MustacheJADALI, FAYEGH Physician/Extender: G Referring Physician: Sherrie MustacheJADALI, FAYEGH Weeks in Treatment: 17 Vascular Assessment Pulses: Posterior Tibial Dorsalis Pedis Palpable: [Left:Yes] Extremity colors, hair growth, and conditions: Extremity Color: [Left:Mottled] Hair Growth on Extremity: [Left:No] Temperature of Extremity: [Left:Warm] Capillary Refill: [Left:< 3 seconds] Toe Nail Assessment Left: Right: Thick: Yes Discolored: No Deformed: No Improper Length and Hygiene: No Electronic Signature(s) Signed: 06/25/2015 5:19:00 PM By: Elpidio EricAfful, Rita BSN, RN Entered By: Elpidio EricAfful, Rita on 06/25/2015 13:29:37 Zundel, [name] ([id number]) -------------------------------------------------------------------------------- Multi Wound Chart Details Patient Name: [name], Kalei N. Date of Service: 06/25/2015 1:30 PM Medical Record Patient Account Number: [id number] Number: Treating RN: Clover MealyAfful, RN, BSN,  Rita 1945-04-24 [phone](70 y.o. 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:the Lower Extremity] [N/A:N/A] Comorbid History: [2:Hypertension, Peripheral Hypertension, Peripheral Arterial Disease, Type II Arterial Disease, Type II Diabetes, Osteoarthritis,  Diabetes, Osteoarthritis, Neuropathy] [4:Neuropathy] [N/A:N/A] Date Acquired: [2:01/28/2015] [4:06/18/2015] [N/A:N/A] Weeks of Treatment: [2:17] [4:1] [N/A:N/A] Wound Status: [2:Open] [4:Open] [N/A:N/A] Measurements L x W x D 0x0x0 [4:0.2x0.2x0.1] [N/A:N/A] (cm) Area (cm) : [2:0] [4:0.031] [N/A:N/A] Volume (cm) : [2:0] [4:0.003] [N/A:N/A] % Reduction in Area: [2:100.00%] [4:99.10%] [N/A:N/A] % Reduction in Volume: 100.00% [4:99.20%] [N/A:N/A] Classification: [2:Full Thickness Without Exposed Support Structures] [4:Grade 1] [N/A:N/A] HBO Classification: [2:Grade 1] [4:N/A] [N/A:N/A] Exudate Amount: [2:None Present] [4:Small] [N/A:N/A] Exudate Type: [2:N/A] [4:Serosanguineous] [N/A:N/A] Exudate Color: [2:N/A] [4:red, brown] [N/A:N/A] Wound Margin: [2:Flat and Intact] [4:Distinct, outline attached] [N/A:N/A] Granulation Amount: [2:None Present (0%)] [4:Large (67-100%)] [N/A:N/A] Granulation Quality: [2:N/A] [4:Pink, Pale] [N/A:N/A] Necrotic Amount: None Present (0%) None Present (0%) N/A Exposed Structures: Fascia: No Fascia: No N/A Fat: No Fat: No Tendon: No Tendon:  No Muscle: No Muscle: No Joint: No Joint: No Bone: No Bone: No Limited to Skin Limited to Skin Breakdown Breakdown Epithelialization: Large (67-100%) Large (67-100%) N/A Periwound Skin Texture: Edema: Yes Edema: Yes N/A Excoriation: No Excoriation: No Induration: No Induration: No Callus: No Callus: No Crepitus: No Crepitus: No Fluctuance: No Fluctuance: No Friable: No Friable: No Rash: No Rash: No Scarring: No Scarring: No Periwound Skin Dry/Scaly: Yes Moist: Yes N/A Moisture: Maceration: No Maceration: No Moist: No Dry/Scaly: No Periwound Skin Color: Atrophie Blanche: No Atrophie Blanche: No N/A Cyanosis: No Cyanosis: No Ecchymosis: No Ecchymosis: No Erythema: No Erythema: No Hemosiderin Staining: No Hemosiderin Staining: No Mottled: No Mottled: No Pallor: No Pallor: No Rubor: No Rubor: No Temperature: No Abnormality No Abnormality N/A Tenderness on No Yes N/A Palpation: Wound Preparation: Ulcer Cleansing: Ulcer Cleansing: Other: N/A Rinsed/Irrigated with water and surg scrub Saline, Other: surg scrub and water Topical Anesthetic Applied: None Topical Anesthetic Applied: None Treatment Notes Electronic Signature(s) Signed: 06/25/2015 5:19:00 PM By: Elpidio Eric BSN, RN Entered By: Elpidio Eric on 06/25/2015 13:51:18 DESIRA, ALESSANDRINI ([id number]) -------------------------------------------------------------------------------- Multi-Disciplinary Care Plan Details Patient Name: [name] [name] Date of Service: 06/25/2015 1:30 PM Medical Record Patient Account Number: [id number] Number: Treating RN: Clover Mealy, RN, BSN, Rita Jan 06, 1946 [phone] y.o. Other Clinician: Date of Birth/Sex: Female) Treating ROBSON, MICHAEL Primary Care Physician: Sherrie Mustache Physician/Extender: G Referring Physician: Sherrie Mustache Weeks in Treatment: 65 Active Inactive Abuse / Safety / Falls / Self Care Management Nursing Diagnoses: Potential for  falls Goals: Patient will remain injury free Date Initiated: 02/20/2015 Goal Status: Active Interventions: Assess fall risk on admission and as needed Notes: Nutrition Nursing Diagnoses: Potential for alteratiion in Nutrition/Potential for imbalanced nutrition Goals: Patient/caregiver will maintain therapeutic glucose control Date Initiated: 02/20/2015 Goal Status: Active Interventions: Assess patient nutrition upon admission and as needed per policy Notes: Orientation to the Wound Care Program Nursing Diagnoses: Knowledge deficit related to the wound healing center program Goals: BRIELE, LAGASSE ([id number]) Patient/caregiver will verbalize understanding of the Wound Healing Center Program Date Initiated: 02/20/2015 Goal Status: Active Interventions: Provide education on orientation to the wound center Notes: Wound/Skin Impairment Nursing Diagnoses: Impaired tissue integrity Goals: Ulcer/skin breakdown will heal within 14 weeks Date Initiated: 02/20/2015 Goal Status: Active Interventions: Assess ulceration(s) every visit Notes: Electronic Signature(s) Signed: 06/25/2015 5:19:00 PM By: Elpidio Eric BSN, RN Entered By: Elpidio Eric on 06/25/2015 13:51:08 HEATHER, MCKENDREE ([id number]) -------------------------------------------------------------------------------- Pain Assessment Details Patient Name: [name] [name] Date of Service: 06/25/2015 1:30 PM Medical Record Patient Account Number: [id number] Number: Treating RN: Clover Mealy, RN, BSN, Rita [birth year] [phone] y.o. Other Clinician: Date of Birth/Sex: Female) Treating ROBSON, MICHAEL Primary Care Physician: Sherrie Mustache Physician/Extender:  G Referring Physician: Sherrie Mustache Weeks in Treatment: 17 Active Problems Location of Pain Severity and Description of Pain Patient Has Paino No Site Locations Pain Management and Medication Current Pain Management: Electronic Signature(s) Signed: 06/25/2015 5:19:00 PM By: Elpidio Eric BSN, RN Entered By: Elpidio Eric on 06/25/2015 13:27:45 Hixon, [name] ([id number]) -------------------------------------------------------------------------------- Patient/Caregiver Education Details Patient Name: [name] [name] Date of Service: 06/25/2015 1:30 PM Medical Record Patient Account Number: [id number] Number: Treating RN: Clover Mealy, RN, BSN, Rita 1945/02/27 [phone] y.o. Other Clinician: Date of Birth/Gender: Female) Treating ROBSON, MICHAEL Primary Care Physician: Sherrie Mustache Physician/Extender: G Referring Physician: Augustin Coupe in Treatment: 17 Education Assessment Education Provided To: Patient Education Topics Provided Welcome To The Wound Care Center: Methods: Explain/Verbal Responses: State content correctly Electronic Signature(s) Signed: 06/25/2015 5:19:00 PM By: Elpidio Eric BSN, RN Entered By: Elpidio Eric on 06/25/2015 14:07:19 KENYONA, RENA ([id number]) -------------------------------------------------------------------------------- Wound Assessment Details Patient Name: [name] [name] Date of Service: 06/25/2015 1:30 PM Medical Record Patient Account Number: [id number] Number: Treating RN: Clover Mealy RN, BSN, Rita [birth year] [phone] y.o. Other Clinician: Date of Birth/Sex: Female) Treating ROBSON, MICHAEL Primary Care Physician: Sherrie Mustache Physician/Extender: G Referring Physician: Sherrie Mustache Weeks in Treatment: 17 Wound Status Wound Number: 2 Primary Arterial Insufficiency Ulcer Etiology: Wound Location: Left Lower Leg - Medial Wound Open Wounding Event: Blister Status: Date Acquired: 01/28/2015 Comorbid Hypertension, Peripheral Arterial Weeks Of Treatment: 17 History: Disease, Type II Diabetes, Clustered Wound: No Osteoarthritis, Neuropathy Photos Photo Uploaded By: Elpidio Eric on 06/25/2015 17:17:42 Wound Measurements Length: (cm) 0 % Reduction i Width: (cm) 0 % Reduction i Depth: (cm) 0 Epithelializa Area:  (cm) 0 Tunneling: Volume: (cm) 0 n Area: 100% n Volume: 100% tion: Large (67-100%) No Wound Description Full Thickness Without Classification: Exposed Support Structures Diabetic Severity Grade 1 (Wagner): Wound Margin: Flat and Intact Exudate Amount: None Present Foul Odor After Cleansing: No Wound Bed Granulation Amount: None Present (0%) Exposed Structure Bonanno, Enyah N. ([id number]) Necrotic Amount: None Present (0%) Fascia Exposed: No Fat Layer Exposed: No Tendon Exposed: No Muscle Exposed: No Joint Exposed: No Bone Exposed: No Limited to Skin Breakdown Periwound Skin Texture Texture Color No Abnormalities Noted: No No Abnormalities Noted: No Callus: No Atrophie Blanche: No Crepitus: No Cyanosis: No Excoriation: No Ecchymosis: No Fluctuance: No Erythema: No Friable: No Hemosiderin Staining: No Induration: No Mottled: No Localized Edema: Yes Pallor: No Rash: No Rubor: No Scarring: No Temperature / Pain Moisture Temperature: No Abnormality No Abnormalities Noted: No Dry / Scaly: Yes Maceration: No Moist: No Wound Preparation Ulcer Cleansing: Rinsed/Irrigated with Saline, Other: surg scrub and water, Topical Anesthetic Applied: None Electronic Signature(s) Signed: 06/25/2015 5:19:00 PM By: Elpidio Eric BSN, RN Entered By: Elpidio Eric on 06/25/2015 13:50:18 Ehly, [name] ([id number]) -------------------------------------------------------------------------------- Wound Assessment Details Patient Name: [name] [name] Date of Service: 06/25/2015 1:30 PM Medical Record Patient Account Number: [id number] Number: Treating RN: Clover Mealy, RN, BSN, Rita September 12, 1945 [phone] y.o. Other Clinician: Date of Birth/Sex: Female) Treating ROBSON, MICHAEL Primary Care Physician: Sherrie Mustache Physician/Extender: G Referring Physician: Sherrie Mustache Weeks in Treatment: 17 Wound Status Wound Number: 4 Primary Diabetic Wound/Ulcer of the Lower Etiology:  Extremity Wound Location: Left Lower Leg - Proximal Wound Open Wounding Event: Trauma Status: Date Acquired: 06/18/2015 Comorbid Hypertension, Peripheral Arterial Weeks Of Treatment: 1 History: Disease, Type II Diabetes, Clustered Wound: No Osteoarthritis, Neuropathy Photos Photo Uploaded By: Elpidio Eric on 06/25/2015 17:18:08 Wound Measurements Length: (cm) 0.2 Width: (cm) 0.2 Depth: (cm) 0.1 Area: (  cm) 0.031 Volume: (cm) 0.003 % Reduction in Area: 99.1% % Reduction in Volume: 99.2% Epithelialization: Large (67-100%) Tunneling: No Undermining: No Wound Description Classification: Grade 1 Wound Margin: Distinct, outline attached Exudate Amount: Small Exudate Type: Serosanguineous Exudate Color: red, brown Foul Odor After Cleansing: No Wound Bed Granulation Amount: Large (67-100%) Exposed Structure Granulation Quality: Pink, Pale Fascia Exposed: No Procter, Colin N. ([id number]) Necrotic Amount: None Present (0%) Fat Layer Exposed: No Tendon Exposed: No Muscle Exposed: No Joint Exposed: No Bone Exposed: No Limited to Skin Breakdown Periwound Skin Texture Texture Color No Abnormalities Noted: No No Abnormalities Noted: No Callus: No Atrophie Blanche: No Crepitus: No Cyanosis: No Excoriation: No Ecchymosis: No Fluctuance: No Erythema: No Friable: No Hemosiderin Staining: No Induration: No Mottled: No Localized Edema: Yes Pallor: No Rash: No Rubor: No Scarring: No Temperature / Pain Moisture Temperature: No Abnormality No Abnormalities Noted: No Tenderness on Palpation: Yes Dry / Scaly: No Maceration: No Moist: Yes Wound Preparation Ulcer Cleansing: Other: water and surg scrub, Topical Anesthetic Applied: None Treatment Notes Wound #4 (Left, Proximal Lower Leg) 1. Cleansed with: Cleanse wound with antibacterial soap and water 3. Peri-wound Care: Barrier cream Moisturizing lotion 4. Dressing Applied: Aquacel Ag 5. Secondary Dressing  Applied Dry Gauze 7. Secured with 3 Layer Compression System - Left Lower Extremity Electronic Signature(s) Signed: 06/25/2015 5:19:00 PM By: Elpidio Eric BSN, RN Entered By: Elpidio Eric on 06/25/2015 13:50:58 Pringle, LUPE HANDLEY ([id number]) MAKENNAH, OMURA ([id number]) -------------------------------------------------------------------------------- Wound Assessment Details Patient Name: [name] [name] Date of Service: 06/25/2015 1:30 PM Medical Record Patient Account Number: [id number] Number: Treating RN: Clover Mealy, RN, BSN, Rita 1945/10/01 [phone] y.o. Other Clinician: Date of Birth/Sex: Female) Treating ROBSON, MICHAEL Primary Care Physician: Sherrie Mustache Physician/Extender: G Referring Physician: Sherrie Mustache Weeks in Treatment: 17 Wound Status Wound Number: 5 Primary Diabetic Wound/Ulcer of the Lower Etiology: Extremity Wound Location: Right Toe Great - Dorsal Wound Open Wounding Event: Trauma Status: Date Acquired: 06/24/2015 Comorbid Hypertension, Peripheral Arterial Weeks Of Treatment: 0 History: Disease, Type II Diabetes, Clustered Wound: No Osteoarthritis, Neuropathy Photos Photo Uploaded By: Elpidio Eric on 06/25/2015 17:18:09 Wound Measurements Length: (cm) 1 Width: (cm) 1.5 Depth: (cm) 0.1 Area: (cm) 1.178 Volume: (cm) 0.118 % Reduction in Area: % Reduction in Volume: Epithelialization: None Tunneling: No Undermining: No Wound Description Classification: Grade 1 Wound Margin: Distinct, outline attached Exudate Amount: Small Exudate Type: Serosanguineous Exudate Color: red, brown Foul Odor After Cleansing: No Wound Bed Granulation Amount: Large (67-100%) Exposed Structure Granulation Quality: Red Fascia Exposed: No Seever, Tametria N. ([id number]) Necrotic Amount: None Present (0%) Fat Layer Exposed: No Tendon Exposed: No Muscle Exposed: No Joint Exposed: No Bone Exposed: No Limited to Skin Breakdown Periwound Skin Texture Texture Color No  Abnormalities Noted: No No Abnormalities Noted: No Callus: No Atrophie Blanche: No Crepitus: No Cyanosis: No Excoriation: No Ecchymosis: No Fluctuance: No Erythema: No Friable: No Hemosiderin Staining: No Induration: No Mottled: No Localized Edema: No Pallor: No Rash: No Rubor: No Scarring: No Temperature / Pain Moisture Temperature: No Abnormality No Abnormalities Noted: No Tenderness on Palpation: Yes Dry / Scaly: No Maceration: No Moist: Yes Wound Preparation Ulcer Cleansing: Rinsed/Irrigated with Saline Topical Anesthetic Applied: None Treatment Notes Wound #5 (Right, Dorsal Toe Great) 1. Cleansed with: Cleanse wound with antibacterial soap and water 3. Peri-wound Care: Barrier cream Moisturizing lotion 4. Dressing Applied: Aquacel Ag 5. Secondary Dressing Applied Dry Gauze 7. Secured with 3 Layer Compression System - Left Lower Extremity Electronic Signature(s) Signed: 06/25/2015 5:19:00 PM  By: Elpidio Eric BSN, RN Entered By: Elpidio Eric on 06/25/2015 14:04:52 JELISHA, WEED ([id number] Koenig, [name] ([id number]) -------------------------------------------------------------------------------- Vitals Details Patient Name: [name] [name] Date of Service: 06/25/2015 1:30 PM Medical Record Patient Account Number: [id number] Number: Treating RN: Clover Mealy, RN, BSN, [place] S[place] March 18, 1945 [phone] y.o. Other Clinician: Date of Birth/Sex: Female) Treating ROBSON, MICHAEL Primary Care Physician: Sherrie Mustache Physician/Extender: G Referring Physician: Sherrie Mustache Weeks in Treatment: 17 Vital Signs Time Taken: 13:29 Temperature (F): 98.2 Height (in): 67 Pulse (bpm): 78 Weight (lbs): 302 Respiratory Rate (breaths/min): 16 Body Mass Index (BMI): 47.3 Blood Pressure (mmHg): 156/65 Reference Range: 80 - 120 mg / dl Electronic Signature(s) Signed: 06/25/2015 5:19:00 PM By: Elpidio Eric BSN, RN Entered By: Elpidio Eric on 06/25/2015 13:30:46

## 2015-06-26 DIAGNOSIS — L97929 Non-pressure chronic ulcer of unspecified part of left lower leg with unspecified severity: Secondary | ICD-10-CM | POA: Diagnosis not present

## 2015-06-26 NOTE — Progress Notes (Signed)
[name], [name] ([id number]) Visit Report for 06/25/2015 Chief Complaint Document Details Patient Name: [name], [name] Date of Service: 06/25/2015 1:30 PM Medical Record Patient Account Number: [id number] Number: Treating RN: Clover Mealy, RN, BSN, Rita 12/03/1945 [phone] y.o. Other Clinician: Date of Birth/Sex: Female) Treating [name] Primary Care Physician/Extender: Kerry Kass, [facility] Physician: Referring Physician: Sherrie Mustache Weeks in Treatment: 17 Information Obtained from: Patient Chief Complaint Left calf ulcerations. Arterial insufficiency. Electronic Signature(s) Signed: 06/26/2015 7:54:52 AM By: Baltazar Najjar MD Entered By: Baltazar Najjar on 06/25/2015 13:51:11 Fake, [name] ([id number]) -------------------------------------------------------------------------------- HPI Details Patient Name: [name] Date of Service: 06/25/2015 1:30 PM Medical Record Patient Account Number: [id number] Number: Treating RN: Clover Mealy RN, BSN, Rita 08-09-1945 [phone] y.o. Other Clinician: Date of Birth/Sex: Female) Treating [name] Primary Care Physician/Extender: Kerry Kass, [facility] Physician: Referring Physician: Sherrie Mustache Weeks in Treatment: 17 History of Present Illness HPI Description: Pleasant 70 year old with diabetes (no hemoglobin A1c available) and severe peripheral vascular disease. She reports a prior left lower extremity stent. Records unavailable. Underwent multiple level angioplasty of left lower extremity 10/29/2014 by Dr. Wyn Quaker. Developed left calf ulcerations in late December 2016. Denies any trauma. Seen by her PCP. Bactrim prescribed. Referred to the wound clinic. Biopsy culture 02/20/2015 grew methicillin sensitive staph aureus, sensitive to Bactrim. Performing dressing changes with Prisma. Using a Tubigrip for edema control. Scheduled to see Dr. Wyn Quaker in follow-up 02/25/2015. Cancelled because of weather. Rescheduled for 03/06/2015. She  returns to clinic for follow-up and is without new complaints. Pain improved. No ischemic rest pain. No fever or chills. Minimal drainage. 03/13/2015 -- records obtained from [place] [facility] shows that she was seen by Dr. dew on 03/12/2015 and given tramadol and other symptomatic treatment. Recent ABI done showed right to be 0.94 and left to be 0.47 a left lower extremity arterial duplex was notable for an occluded stent and distal ATA. Recommendations were for left lower extremity angiogram with intervention. on 03/11/2015 she was taken up for a aortogram with angioplasty of the left posterior tibial artery and tibioperoneal trunk, above-knee popliteal artery and almost entire SFA, stent placement to the SFA and popliteal artery. 03/20/15 her arterial interventions noted. The patient states her left leg feels better. He ischemic wound on her left anterior leg. She has been using Santyl 04/09/15; the patient arrives back today. 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 dew. I was concerned about her last week as she had weeping edema fluid and areas of threatened ulceration around her wound. I ordered a silver alginate, Profore light wrap. She comes back today with an Radio broadcast assistant on apparently applied by Dr. Driscilla Grammes office. Her edema is under much better control and the weeping area here last week surrounding her major wound has resolved. She does have another open area which is small and superficial. I'm not sure I would put an Unna boot on this lady's degree of arterial insufficiency. 04/16/15; her original small wound appears to be healthy with good granulation her measurements are improved. She has a small area underneath this. Both underwent selective debridement since surface slough 04/23/15; the original wound has filled in there is healthy granulation here. The small area underneath this is healed. No debridement was required 04/30/15;  the wound no longer has any depth. no Debridement required 05/07/15; the wound appears to be stable. Advancing epithelialization. Decrease wound volume. No [name], [name] ([id number]) debridement is required. She has known arterial disease  status post stent placement to the SFA and popliteal artery. 05/14/15 unfortunately the wound is not really decreased much this week she still has a rim of epithelialization. The wound bed looks stable nevertheless I did a surgical debridement and there is some adherent surface slough. She has known PAD however the blood supply appears to be adequate status post stent to the SFA and popliteal and popliteal artery 05/21/15 wound is healthier than last week. No debridement is necessary. Known PAD status post stent to the SFA and popliteal artery 05/28/15; wound is no different from last week and in fact looks "stalled". Fairly we put Iodoflex on him last week however home health used Prisma 06/04/15 if anything the wound area appears to be larger. 06/11/15 switch to silver alginate last week and this seems to have helped. There is a thin layer of epithelialization only visible under the light 06/18/15; the patient had her leg traumatized when a grandchild fell over her leg at a party last weekend. Her original wound is just about closed very tiny however she has a new larger superficial wound just distal to the original wound. No evidence of infection 06/25/15; both of the wounds on her left leg including the chronic wound we have been treating her for a prolonged period of time now, and the traumatic wound from last week both are fully epithelialized. She apparently had a slip this week and managed to lift her nail off her right great toe. Electronic Signature(s) Signed: 06/26/2015 7:54:52 AM By: Baltazar Najjar MD Entered By: Baltazar Najjar on 06/25/2015 13:55:02 [name], [name]  ([id number]) -------------------------------------------------------------------------------- Physical Exam Details Patient Name: [name] Date of Service: 06/25/2015 1:30 PM Medical Record Patient Account Number: [id number] Number: Treating RN: Clover Mealy RN, BSN, Rita July 17, 1945 [phone] y.o. Other Clinician: Date of Birth/Sex: Female) Treating Sarafina Puthoff Primary Care Physician/Extender: Kerry Kass, [facility] Physician: Referring Physician: Sherrie Mustache Weeks in Treatment: 17 Constitutional Patient is hypertensive.. Pulse regular and within target range for patient.Marland Kitchen Respirations regular, non-labored and within target range.. Temperature is normal and within the target range for the patient.Marland Kitchen Respiratory Respiratory effort is easy and symmetric bilaterally. Rate is normal at rest and on room air.. Cardiovascular Pedal pulses palpable and strong bilaterally.. Edema present in both extremities.. Lymphatic None palpable in the popliteal or inguinal area. Psychiatric No evidence of depression, anxiety, or agitation. Calm, cooperative, and communicative. Appropriate interactions and affect.. Notes Wound exam; her edema is well controlled in the left leg area. Both of her wounds are fully epithelialized however I don't think they're ready to come out of compression until next week and then she will need stockings. With scissors I was able to remove the left great toenail unfortunately of the base of the nail there is an open area which we redefined is another wound Electronic Signature(s) Signed: 06/26/2015 7:54:52 AM By: Baltazar Najjar MD Entered By: Baltazar Najjar on 06/25/2015 13:58:11 Cassada, [name] ([id number]) -------------------------------------------------------------------------------- Physician Orders Details Patient Name: [name] Date of Service: 06/25/2015 1:30 PM Medical Record Patient Account Number: [id number] Number: Treating RN: Clover Mealy, RN,  BSN, Rita 05/02/1945 [phone] y.o. 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 ([id number]) o Change dressing every week Wound #5 Right,Dorsal Toe Great o Change dressing every other day. Follow-up Appointments Wound #4 Left,Proximal Lower Leg o Return Appointment in 1 week. Edema Control Wound #4 Left,Proximal Lower Leg o 3 Layer Compression System - Left Lower Extremity Additional Orders / Instructions Wound #4 Left,Proximal Lower Leg o Increase protein intake. o Activity as tolerated Wound #5 Right,Dorsal Toe Great o Increase protein intake. o Activity as tolerated Electronic Signature(s) Signed: 06/25/2015 5:19:00 PM By: Elpidio EricAfful, Rita BSN, RN Signed: 06/26/2015 7:54:52 AM By: Baltazar Najjarobson, Mourad Cwikla MD Entered By: Elpidio EricAfful, Rita on 06/25/2015 14:06:19 [name] InfanteWOODS, Cache N.  ([id number]) -------------------------------------------------------------------------------- Problem List Details Patient Name: [name] InfanteWOODS, Xitlally N. Date of Service: 06/25/2015 1:30 PM Medical Record Patient Account Number: [id number] Number: Treating RN: Clover MealyAfful, RN, BSN, Rita 1945-07-29 [phone](70 y.o. 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 Atherosclerosis of unspecified type of bypass graft(s) of 02/21/2015 Yes the left leg with ulceration of calf E11.622 Type 2 diabetes mellitus with other skin ulcer 02/21/2015 Yes I70.322 Atherosclerosis of unspecified type of bypass graft(s) of 02/21/2015 Yes the extremities with rest pain, left leg L97.511 Non-pressure chronic ulcer of other part of right foot 06/25/2015 Yes limited to breakdown of skin Inactive Problems Resolved Problems Electronic Signature(s) Signed: 06/26/2015 7:54:52 AM By: Baltazar Najjarobson, Classie Weng MD Entered By: Baltazar Najjarobson, Ginnie Marich on 06/25/2015 14:01:03 Jeudy, [name] [name] ([id number]) -------------------------------------------------------------------------------- Progress Note Details Patient Name: [name] InfanteWOODS, Mishka N. Date of Service: 06/25/2015 1:30 PM Medical Record Patient Account Number: [id number] Number: Treating RN: Clover MealyAfful, RN, BSN, Rita 1945-07-29 [phone](70 y.o. Other Clinician: Date of Birth/Sex: Female) Treating Duwane Gewirtz Primary Care Physician/Extender: Kerry KassG [facility]ADALI, [facility] Physician: Referring Physician: Sherrie MustacheJADALI, FAYEGH Weeks in Treatment: 17 Subjective Chief Complaint Information obtained from Patient Left calf ulcerations. Arterial insufficiency. History of Present Illness (HPI) Pleasant 70 year old with diabetes (no hemoglobin A1c  available) and severe peripheral vascular disease. She reports a prior left lower extremity stent. Records unavailable. Underwent multiple level angioplasty of left lower extremity 10/29/2014 by Dr. Wyn Quakerew. Developed left calf ulcerations in late December 2016. Denies any trauma. Seen by her PCP. Bactrim prescribed. Referred to the wound clinic. Biopsy culture 02/20/2015 grew methicillin sensitive staph aureus, sensitive to Bactrim. Performing dressing changes with Prisma. Using a Tubigrip for edema control. Scheduled to see Dr. Wyn Quakerew in follow-up 02/25/2015. Cancelled because of weather. Rescheduled for 03/06/2015. She returns to clinic for follow-up and is without new complaints. Pain improved. No ischemic rest pain. No fever or chills. Minimal drainage. 03/13/2015 -- records obtained from [facility] shows that she was seen by Dr. dew on 03/12/2015 and given tramadol and other symptomatic treatment. Recent ABI done showed right to be 0.94 and left to be 0.47 a  left lower extremity arterial duplex was notable for an occluded stent and distal ATA. Recommendations were for left lower extremity angiogram with intervention. on 03/11/2015 she was taken up for a aortogram with angioplasty of the left posterior tibial artery and tibioperoneal trunk, above-knee popliteal artery and almost entire SFA, stent placement to the SFA and popliteal artery. 03/20/15 her arterial interventions noted. The patient states her left leg feels better. He ischemic wound on her left anterior leg. She has been using Santyl 04/09/15; the patient arrives back today. 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 dew. I was concerned about her last week as she had weeping edema fluid and areas of threatened ulceration around her wound. I ordered a silver alginate, Profore light wrap. She comes back today with an Radio broadcast assistant on apparently applied by Dr. Driscilla Grammes office. Her  edema is under much better control and the weeping area here last week surrounding her major wound has resolved. She does have another open area which is small and superficial. I'm not sure I would put an Unna boot on this lady's degree of arterial insufficiency. 04/16/15; her original small wound appears to be healthy with good granulation her measurements are improved. She has a small area underneath this. Both underwent selective debridement since surface Lampe, Sonnie N. ([id number]) slough 04/23/15; the original wound has filled in there is healthy granulation here. The small area underneath this is healed. No debridement was required 04/30/15; the wound no longer has any depth. no Debridement required 05/07/15; the wound appears to be stable. Advancing epithelialization. Decrease wound volume. No debridement is required. She has known arterial disease status post stent placement to the SFA and popliteal artery. 05/14/15 unfortunately the wound is not really decreased much this week she still has a rim of epithelialization. The wound bed looks stable nevertheless I did a surgical debridement and there is some adherent surface slough. She has known PAD however the blood supply appears to be adequate status post stent to the SFA and popliteal and popliteal artery 05/21/15 wound is healthier than last week. No debridement is necessary. Known PAD status post stent to the SFA and popliteal artery 05/28/15; wound is no different from last week and in fact looks "stalled". Fairly we put Iodoflex on him last week however home health used Prisma 06/04/15 if anything the wound area appears to be larger. 06/11/15 switch to silver alginate last week and this seems to have helped. There is a thin layer of epithelialization only visible under the light 06/18/15; the patient had her leg traumatized when a grandchild fell over her leg at a party last weekend. Her original wound is just about closed very tiny however  she has a new larger superficial wound just distal to the original wound. No evidence of infection 06/25/15; both of the wounds on her left leg including the chronic wound we have been treating her for a prolonged period of time now, and the traumatic wound from last week both are fully epithelialized. She apparently had a slip this week and managed to lift her nail off her right great toe. Objective Constitutional Patient is hypertensive.. Pulse regular and within target range for patient.Marland Kitchen Respirations regular, non-labored and within target range.. Temperature is normal and within the target range for the patient.. Vitals Time Taken: 1:29 PM, Height: 67 in, Weight: 302 lbs, BMI: 47.3, Temperature: 98.2 F, Pulse: 78 bpm, Respiratory Rate: 16 breaths/min, Blood Pressure: 156/65 mmHg. Respiratory Respiratory effort is  easy and symmetric bilaterally. Rate is normal at rest and on room air.. Cardiovascular Pedal pulses palpable and strong bilaterally.. Edema present in both extremities.. Lymphatic None palpable in the popliteal or inguinal area. JAMILYA, SARRAZIN ([id number]) Psychiatric No evidence of depression, anxiety, or agitation. Calm, cooperative, and communicative. Appropriate interactions and affect.. General Notes: Wound exam; her edema is well controlled in the left leg area. Both of her wounds are fully epithelialized however I don't think they're ready to come out of compression until next week and then she will need stockings. With scissors I was able to remove the left great toenail unfortunately of the base of the nail there is an open area which we redefined is another wound Integumentary (Hair, Skin) Wound #2 status is Open. Original cause of wound was Blister. The wound is located on the Left,Medial Lower Leg. The wound measures 0cm length x 0cm width x 0cm depth; 0cm^2 area and 0cm^3 volume. The wound is limited to skin breakdown. There is no tunneling noted. There is a none  present amount of drainage noted. The wound margin is flat and intact. There is no granulation within the wound bed. There is no necrotic tissue within the wound bed. The periwound skin appearance exhibited: Localized Edema, Dry/Scaly. The periwound skin appearance did not exhibit: Callus, Crepitus, Excoriation, Fluctuance, Friable, Induration, Rash, Scarring, Maceration, Moist, Atrophie Blanche, Cyanosis, Ecchymosis, Hemosiderin Staining, Mottled, Pallor, Rubor, Erythema. Periwound temperature was noted as No Abnormality. Wound #4 status is Open. Original cause of wound was Trauma. The wound is located on the Left,Proximal Lower Leg. The wound measures 0.2cm length x 0.2cm width x 0.1cm depth; 0.031cm^2 area and 0.003cm^3 volume. The wound is limited to skin breakdown. There is no tunneling or undermining noted. There is a small amount of serosanguineous drainage noted. The wound margin is distinct with the outline attached to the wound base. There is large (67-100%) pink, pale granulation within the wound bed. There is no necrotic tissue within the wound bed. The periwound skin appearance exhibited: Localized Edema, Moist. The periwound skin appearance did not exhibit: Callus, Crepitus, Excoriation, Fluctuance, Friable, Induration, Rash, Scarring, Dry/Scaly, Maceration, Atrophie Blanche, Cyanosis, Ecchymosis, Hemosiderin Staining, Mottled, Pallor, Rubor, Erythema. Periwound temperature was noted as No Abnormality. The periwound has tenderness on palpation. 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, left leg AYLIANA, CASCIANO ([id number]) Plan Wound Cleansing: Wound #2 Left,Medial Lower  Leg: Cleanse wound with mild soap and water Skin Barriers/Peri-Wound Care: Wound #2 Left,Medial Lower Leg: Barrier cream Moisturizing lotion Wound #4 Left,Proximal Lower Leg: Barrier cream Moisturizing lotion Primary Wound Dressing: Wound #2 Left,Medial Lower Leg: Aquacel Ag Wound #4 Left,Proximal Lower Leg: Aquacel Ag Secondary Dressing: Wound #2 Left,Medial Lower Leg: Dry Gauze Wound #4 Left,Proximal Lower Leg: Dry Gauze Dressing Change Frequency: Wound #2 Left,Medial Lower Leg: Change dressing every week Wound #4 Left,Proximal Lower Leg: Change dressing every week Follow-up Appointments: Wound #2 Left,Medial Lower Leg: Return Appointment in 1 week. Wound #4 Left,Proximal Lower Leg: Return Appointment in 1 week. Edema Control: Wound #2 Left,Medial Lower Leg: 3 Layer Compression System - Left Lower Extremity Wound #4 Left,Proximal Lower Leg: 3 Layer Compression System - Left Lower Extremity #1 will continue with silver alginate to the 2 wounds  on the left leg and Profore light compression #2 to the right great toe silver alginate Kerlix toe socket Electronic Signature(s) LIVIAH, CAKE ([id number]) Signed: 06/26/2015 7:54:52 AM By: Baltazar Najjar MD Entered By: Baltazar Najjar on 06/25/2015 13:58:58 Gassmann, [name] -------------------------------------------------------------------------------- SuperBill Details Patient Name: [name] Date of Service: 06/25/2015 Medical Record Patient Account Number: [id number] Number: Treating RN: Clover Mealy RN, BSN, Rita [birth year] [phone] y.o. 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

## 2015-07-02 ENCOUNTER — Encounter: Payer: Medicare Other | Admitting: Internal Medicine

## 2015-07-02 DIAGNOSIS — I70342 Atherosclerosis of unspecified type of bypass graft(s) of the left leg with ulceration of calf: Secondary | ICD-10-CM | POA: Diagnosis not present

## 2015-07-02 DIAGNOSIS — M1990 Unspecified osteoarthritis, unspecified site: Secondary | ICD-10-CM | POA: Diagnosis not present

## 2015-07-02 DIAGNOSIS — I10 Essential (primary) hypertension: Secondary | ICD-10-CM | POA: Diagnosis not present

## 2015-07-02 DIAGNOSIS — E1140 Type 2 diabetes mellitus with diabetic neuropathy, unspecified: Secondary | ICD-10-CM | POA: Diagnosis not present

## 2015-07-02 DIAGNOSIS — E11622 Type 2 diabetes mellitus with other skin ulcer: Secondary | ICD-10-CM | POA: Diagnosis not present

## 2015-07-02 DIAGNOSIS — I739 Peripheral vascular disease, unspecified: Secondary | ICD-10-CM | POA: Diagnosis not present

## 2015-07-02 DIAGNOSIS — Z872 Personal history of diseases of the skin and subcutaneous tissue: Secondary | ICD-10-CM | POA: Diagnosis not present

## 2015-07-02 DIAGNOSIS — Z09 Encounter for follow-up examination after completed treatment for conditions other than malignant neoplasm: Secondary | ICD-10-CM | POA: Diagnosis not present

## 2015-07-02 DIAGNOSIS — I70322 Atherosclerosis of unspecified type of bypass graft(s) of the extremities with rest pain, left leg: Secondary | ICD-10-CM | POA: Diagnosis not present

## 2015-07-02 DIAGNOSIS — L97221 Non-pressure chronic ulcer of left calf limited to breakdown of skin: Secondary | ICD-10-CM | POA: Diagnosis not present

## 2015-07-03 NOTE — Progress Notes (Signed)
[name], [name] ([id number]) Visit Report for 07/02/2015 Chief Complaint Document Details Patient Name: [name], [name] Date of Service: 07/02/2015 12:45 PM Medical Record Patient Account Number: [id number] Number: Treating RN: Clover MealyAfful, RN, BSN, Rita 01/15/1946 [phone](70 y.o. Other Clinician: Date of Birth/Sex: Female) Treating ROBSON, MICHAEL Primary Care Physician/Extender: Kerry KassG [facility]AD[facility], [facility] Physician: Referring Physician: Sherrie MustacheJADALI, FAYEGH Weeks in Treatment: 18 Information Obtained from: Patient Chief Complaint Left calf ulcerations. Arterial insufficiency. Electronic Signature(s) Signed: 07/03/2015 7:56:10 AM By: Baltazar Najjarobson, Michael MD Entered By: Baltazar Najjarobson, Michael on 07/02/2015 13:02:56 [name], [name] [name] ([id number]) -------------------------------------------------------------------------------- HPI Details Patient Name: [name], [name] Date of Service: 07/02/2015 12:45 PM Medical Record Patient Account Number: [id number] Number: Treating RN: Clover MealyAfful, RN, BSN, Rita 01/15/1946 [phone](70 y.o. Other Clinician: Date of Birth/Sex: Female) Treating ROBSON, MICHAEL Primary Care Physician/Extender: Kerry KassG JADALI, [facility] Physician: Referring Physician: Sherrie MustacheJADALI, FAYEGH Weeks in Treatment: 18 History of Present Illness HPI Description: Pleasant 70 year old with diabetes (no hemoglobin A1c available) and severe peripheral vascular disease. She reports a prior left lower extremity stent. Records unavailable. Underwent multiple level angioplasty of left lower extremity 10/29/2014 by Dr. Wyn Quakerew. Developed left calf ulcerations in late December 2016. Denies any trauma. Seen by her PCP. Bactrim prescribed. Referred to the wound clinic. Biopsy culture 02/20/2015 grew methicillin sensitive staph aureus, sensitive to Bactrim. Performing dressing changes with Prisma. Using a Tubigrip for edema control. Scheduled to see Dr. Wyn Quakerew in follow-up 02/25/2015. Cancelled because of weather. Rescheduled  for 03/06/2015. She returns to clinic for follow-up and is without new complaints. Pain improved. No ischemic rest pain. No fever or chills. Minimal drainage. 03/13/2015 -- records obtained from [place] [facility] shows that she was seen by Dr. dew on 03/12/2015 and given tramadol and other symptomatic treatment. Recent ABI done showed right to be 0.94 and left to be 0.47 a left lower extremity arterial duplex was notable for an occluded stent and distal ATA. Recommendations were for left lower extremity angiogram with intervention. on 03/11/2015 she was taken up for a aortogram with angioplasty of the left posterior tibial artery and tibioperoneal trunk, above-knee popliteal artery and almost entire SFA, stent placement to the SFA and popliteal artery. 03/20/15 her arterial interventions noted. The patient states her left leg feels better. He ischemic wound on her left anterior leg. She has been using Santyl 04/09/15; the patient arrives back today. 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 dew. I was concerned about her last week as she had weeping edema fluid and areas of threatened ulceration around her wound. I ordered a silver alginate, Profore light wrap. She comes back today with an Radio broadcast assistantUnna boot on apparently applied by Dr. Driscilla Grammesdew's office. Her edema is under much better control and the weeping area here last week surrounding her major wound has resolved. She does have another open area which is small and superficial. I'm not sure I would put an Unna boot on this lady's degree of arterial insufficiency. 04/16/15; her original small wound appears to be healthy with good granulation her measurements are improved. She has a small area underneath this. Both underwent selective debridement since surface slough 04/23/15; the original wound has filled in there is healthy granulation here. The small area underneath this is healed. No debridement  was required 04/30/15; the wound no longer has any depth. no Debridement required 05/07/15; the wound appears to be stable. Advancing epithelialization. Decrease wound volume. No Basham, [name] ([id number]) debridement is required. She has known arterial disease  status post stent placement to the SFA and popliteal artery. 05/14/15 unfortunately the wound is not really decreased much this week she still has a rim of epithelialization. The wound bed looks stable nevertheless I did a surgical debridement and there is some adherent surface slough. She has known PAD however the blood supply appears to be adequate status post stent to the SFA and popliteal and popliteal artery 05/21/15 wound is healthier than last week. No debridement is necessary. Known PAD status post stent to the SFA and popliteal artery 05/28/15; wound is no different from last week and in fact looks "stalled". Fairly we put Iodoflex on him last week however home health used Prisma 06/04/15 if anything the wound area appears to be larger. 06/11/15 switch to silver alginate last week and this seems to have helped. There is a thin layer of epithelialization only visible under the light 06/18/15; the patient had her leg traumatized when a grandchild fell over her leg at a party last weekend. Her original wound is just about closed very tiny however she has a new larger superficial wound just distal to the original wound. No evidence of infection 06/25/15; both of the wounds on her left leg including the chronic wound we have been treating her for a prolonged period of time now, and the traumatic wound from last week both are fully epithelialized. She apparently had a slip this week and managed to lift her nail off her right great toe. 07/02/15; patient's legs are carefully inspected. Her original wound on the left medial leg is completely healed. Traumatic area on her right great toenail from last week is also healed. She has combined  venous and arterial insufficiency we have obtained her juxtalite stockings bilaterally. Electronic Signature(s) Signed: 07/03/2015 7:56:10 AM By: Baltazar Najjar MD Entered By: Baltazar Najjar on 07/02/2015 13:28:25 [name], [name] ([id number]) -------------------------------------------------------------------------------- Physical Exam Details Patient Name: [name] [name] Date of Service: 07/02/2015 12:45 PM Medical Record Patient Account Number: [id number] Number: Treating RN: Clover Mealy RN, BSN, Rita Feb 16, 1946 [phone] y.o. Other Clinician: Date of Birth/Sex: Female) Treating ROBSON, MICHAEL Primary Care Physician/Extender: Kerry Kass, [facility] Physician: Referring Physician: Sherrie Mustache Weeks in Treatment: 18 Constitutional Patient is hypertensive.. Pulse regular and within target range for patient.Marland Kitchen Respirations regular, non-labored and within target range.. Temperature is normal and within the target range for the patient.. The patient appears systemically well. Eyes Conjunctivae clear. No discharge. No scleral icterus. Cardiovascular Pedal pulses reduced but palpable. Edema present in both extremities.. Lymphatic Nonpalpable and popliteal or inguinal areas. Psychiatric No evidence of depression, anxiety, or agitation. Calm, cooperative, and communicative. Appropriate interactions and affect.. Notes Wound exam; her edema is well controlled and her left leg area. She has stasis physiology. Wounds are fully healed. Right great toenail that is also healed. The perfusion in her legs status post revascularization on the left also appears that she considerably better Electronic Signature(s) Signed: 07/03/2015 7:56:10 AM By: Baltazar Najjar MD Entered By: Baltazar Najjar on 07/02/2015 13:33:19 [name], [name] ([id number]) -------------------------------------------------------------------------------- Physician Orders Details Patient Name: [name] [name] Date of Service:  07/02/2015 12:45 PM Medical Record Patient Account Number: [id number] Number: Treating RN: Clover Mealy, RN, BSN, Rita [birth year] [phone] y.o. 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 o Elevate legs to the level of the heart and pump ankles as often as possible Discharge From [facility] Services o Discharge from Wound Care Center - Treatment Completed Electronic Signature(s) Signed: 07/03/2015 7:56:10 AM By: Baltazar Najjar MD Signed: 07/03/2015 11:39:04 AM By: Elpidio Eric BSN, RN Entered By: Elpidio Eric on 07/02/2015 13:11:15 BRISTYL, MCLEES ([id number]) -------------------------------------------------------------------------------- Problem List Details Patient Name: [name] [name] Date of Service: 07/02/2015 12:45 PM Medical Record Patient Account Number: [id number] Number: Treating RN: Clover Mealy RN, BSN, Rita Nov 06, 1945 [phone] y.o. Other Clinician: Date of Birth/Sex: Female) Treating ROBSON, MICHAEL Primary Care Physician/Extender: Kerry Kass, [facility] Physician: Referring Physician: Sherrie Mustache Weeks in Treatment: 18 Active Problems ICD-10 Encounter Code Description Active Date Diagnosis I73.9 Peripheral vascular  disease, unspecified 02/21/2015 Yes I70.342 Atherosclerosis of unspecified type of bypass graft(s) of 02/21/2015 Yes the left leg with ulceration of calf E11.622 Type 2 diabetes mellitus with other skin ulcer 02/21/2015 Yes I70.322 Atherosclerosis of unspecified type of bypass graft(s) of 02/21/2015 Yes the extremities with rest pain, left leg L97.511 Non-pressure chronic ulcer of other part of right foot 06/25/2015 Yes limited to breakdown of skin Inactive Problems Resolved Problems Electronic Signature(s) Signed: 07/03/2015 7:56:10 AM By: Baltazar Najjar MD Entered By: Baltazar Najjar on 07/02/2015 13:02:45 [name], [name] ([id number]) -------------------------------------------------------------------------------- Progress Note Details Patient Name: [name] [name] Date of Service: 07/02/2015 12:45 PM Medical Record Patient Account Number: [id number] Number: Treating RN: Clover Mealy, RN, BSN, Rita Dec 11, 1945 [phone] y.o. Other Clinician: Date of Birth/Sex: Female) Treating ROBSON, MICHAEL Primary Care Physician/Extender: Kerry Kass, [facility] Physician: Referring Physician: Sherrie Mustache Weeks in Treatment: 18 Subjective Chief Complaint Information obtained from Patient Left calf ulcerations. Arterial insufficiency. History of Present Illness (HPI) Pleasant 70 year old with diabetes (no hemoglobin A1c available) and severe peripheral vascular disease. She reports a prior left lower extremity stent. Records unavailable. Underwent multiple level angioplasty of left lower extremity 10/29/2014 by Dr. Wyn Quaker. Developed left calf ulcerations in late December 2016. Denies any trauma. Seen by her PCP. Bactrim prescribed. Referred to the wound clinic. Biopsy culture 02/20/2015 grew methicillin sensitive staph aureus, sensitive to Bactrim. Performing dressing changes with Prisma. Using a Tubigrip for edema control. Scheduled to see Dr. Wyn Quaker in follow-up 02/25/2015. Cancelled because of weather. Rescheduled  for 03/06/2015. She returns to clinic for follow-up and is without new complaints. Pain improved. No ischemic rest pain. No fever or chills. Minimal drainage. 03/13/2015 -- records obtained from [place] [facility] shows that she was seen by Dr. dew on 03/12/2015 and given tramadol and other symptomatic treatment. Recent ABI done showed right to be 0.94 and left to be 0.47 a left lower extremity arterial duplex was notable for an occluded stent and distal ATA. Recommendations were for left lower extremity angiogram with intervention. on 03/11/2015 she was taken up for a aortogram with angioplasty of the left posterior tibial artery and tibioperoneal trunk, above-knee popliteal artery and almost entire SFA, stent placement to the SFA and popliteal artery. 03/20/15 her arterial interventions noted. The patient states her left leg feels better. He ischemic wound on her left anterior leg. She has been  using Santyl 04/09/15; the patient arrives back today. 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 dew. I was concerned about her last week as she had weeping edema fluid and areas of threatened ulceration around her wound. I ordered a silver alginate, Profore light wrap. She comes back today with an Radio broadcast assistant on apparently applied by Dr. Driscilla Grammes office. Her edema is under much better control and the weeping area here last week surrounding her major wound has resolved. She does have another open area which is small and superficial. I'm not sure I would put an Unna boot on this lady's degree of arterial insufficiency. 04/16/15; her original small wound appears to be healthy with good granulation her measurements are improved. She has a small area underneath this. Both underwent selective debridement since surface [name], [name] ([id number]) slough 04/23/15; the original wound has filled in there is healthy granulation here. The small area underneath this  is healed. No debridement was required 04/30/15; the wound no longer has any depth. no Debridement required 05/07/15; the wound appears to be stable. Advancing epithelialization. Decrease wound volume. No debridement is required. She has known arterial disease status post stent placement to the SFA and popliteal artery. 05/14/15 unfortunately the wound is not really decreased much this week she still has a rim of epithelialization. The wound bed looks stable nevertheless I did a surgical debridement and there is some adherent surface slough. She has known PAD however the blood supply appears to be adequate status post stent to the SFA and popliteal and popliteal artery 05/21/15 wound is healthier than last week. No debridement is necessary. Known PAD status post stent to the SFA and popliteal artery 05/28/15; wound is no different from last week and in fact looks "stalled". Fairly we put Iodoflex on him last week however home health used Prisma 06/04/15 if anything the wound area appears to be larger. 06/11/15 switch to silver alginate last week and this seems to have helped. There is a thin layer of epithelialization only visible under the light 06/18/15; the patient had her leg traumatized when a grandchild fell over her leg at a party last weekend. Her original wound is just about closed very tiny however she has a new larger superficial wound just distal to the original wound. No evidence of infection 06/25/15; both of the wounds on her left leg including the chronic wound we have been treating her for a prolonged period of time now, and the traumatic wound from last week both are fully epithelialized. She apparently had a slip this week and managed to lift her nail off her right great toe. 07/02/15; patient's legs are carefully inspected. Her original wound on the left medial leg is completely healed. Traumatic area on her right great toenail from last week is also healed. She has combined  venous and arterial insufficiency we have obtained her juxtalite stockings bilaterally. Objective Constitutional Patient is hypertensive.. Pulse regular and within target range for patient.Marland Kitchen Respirations regular, non-labored and within target range.. Temperature is normal and within the target range for the patient.. Vitals Time Taken: 12:53 PM, Height: 67 in, Weight: 302 lbs, BMI: 47.3, Temperature: 98.2 F, Pulse: 75 bpm, Respiratory Rate: 17 breaths/min, Blood Pressure: 162/100 mmHg. Cardiovascular Pedal pulses reduced but palpable. Edema present in both extremities.. Lymphatic Nonpalpable and popliteal or inguinal areas. [name], [name] Psychiatric No evidence of depression, anxiety, or agitation. Calm, cooperative, and communicative. Appropriate interactions and affect.. General Notes: Wound exam;  her edema is well controlled and her left leg area. She has stasis physiology. Wounds are fully healed. Right great toenail that is also healed. The perfusion in her legs status post revascularization on the left also appears that she considerably better Integumentary (Hair, Skin) Wound #4 status is Healed - Epithelialized. Original cause of wound was Trauma. The wound is located on the Left,Proximal Lower Leg. The wound measures 0cm length x 0cm width x 0cm depth; 0cm^2 area and 0cm^3 volume. The wound is limited to skin breakdown. There is no tunneling noted. There is a none present amount of drainage noted. The wound margin is distinct with the outline attached to the wound base. There is no granulation within the wound bed. There is no necrotic tissue within the wound bed. The periwound skin appearance did not exhibit: Callus, Crepitus, Excoriation, Fluctuance, Friable, Induration, Localized Edema, Rash, Scarring, Dry/Scaly, Maceration, Moist, Atrophie Blanche, Cyanosis, Ecchymosis, Hemosiderin Staining, Mottled, Pallor, Rubor, Erythema. Periwound temperature was noted as  No Abnormality. Wound #5 status is Healed - Epithelialized. Original cause of wound was Trauma. The wound is located on the Right,Dorsal [employer]. The wound measures 0cm length x 0cm width x 0cm depth; 0cm^2 area and 0cm^3 volume. The wound is limited to skin breakdown. There is no tunneling or undermining noted. There is a none present amount of drainage noted. The wound margin is distinct with the outline attached to the wound base. There is no granulation within the wound bed. There is no necrotic tissue within the wound bed. The periwound skin appearance did not exhibit: Callus, Crepitus, Excoriation, Fluctuance, Friable, Induration, Localized Edema, Rash, Scarring, Dry/Scaly, Maceration, Moist, Atrophie Blanche, Cyanosis, Ecchymosis, Hemosiderin Staining, Mottled, Pallor, Rubor, Erythema. Periwound temperature was noted as No Abnormality. 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 ([id number]) Edema Control: Patient to wear own Juxtalite/Juzo compression garment. Elevate legs to the level of the heart and pump ankles as often as possible Discharge From [facility] Services: Discharge from Wound Care Center - Treatment Completed #1 we have discharged her into juxtalite stockings bilaterally #2 wounds are all healed, left leg has been revascularized successfully. #3 patient can be discharged from the Center today Electronic Signature(s) Signed: 07/03/2015 7:56:10 AM By: Baltazar Najjar MD Entered By: Baltazar Najjar on 07/02/2015 13:31:54 [place], [name]  ([id number]) -------------------------------------------------------------------------------- SuperBill Details Patient Name: [name] [name] Date of Service: 07/02/2015 Medical Record Patient Account Number: [id number] Number: Treating RN: Clover Mealy RN, BSN, Rita [birth year] [phone] y.o. 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 E11.622 Type 2 diabetes mellitus with other skin ulcer Atherosclerosis of unspecified type of bypass graft(s) of the extremities with rest pain, left I70.322 leg L97.511 Non-pressure chronic ulcer of other part of right foot limited to breakdown of skin Facility Procedures CPT4 Code: [id number] Description: 86578 - WOUND CARE VISIT-LEV 2 EST PT Modifier: Quantity: 1 Physician  Procedures CPT4: Description Modifier Quantity Code [id number] 99213 - WC PHYS LEVEL 3 - EST PT 1 ICD-10 Description Diagnosis 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

## 2015-07-03 NOTE — Progress Notes (Signed)
[name] Visit Report for 07/02/2015 Arrival Information Details Patient Name: [name], [name] Date of Service: 07/02/2015 12:45 PM Medical Record Patient Account Number: [id number] Number: Treating RN: Clover Mealy, RN, BSN, Rita 09-09-1945 [phone] y.o. Other Clinician: Date of Birth/Sex: Female) Treating ROBSON, MICHAEL Primary Care Physician: Sherrie Mustache Physician/Extender: G Referring Physician: Sherrie Mustache Weeks in Treatment: 18 Visit Information History Since Last Visit Added or deleted any medications: No Patient Arrived: Wheel Chair Any new allergies or adverse reactions: No Arrival Time: 12:50 Had a fall or experienced change in No Accompanied By: friend activities of daily living that may affect Transfer Assistance: None risk of falls: Patient Identification Verified: Yes Signs or symptoms of abuse/neglect since last No Secondary Verification Process Yes visito Completed: Hospitalized since last visit: No Patient Requires Transmission- No Has Dressing in Place as Prescribed: Yes Based Precautions: Has Compression in Place as Prescribed: Yes Patient Has Alerts: Yes Pain Present Now: No Patient Alerts: Patient on Blood Thinner plavix, aspirin 81mg  DMII ABI right 0.94 ABI left 1.16 Electronic Signature(s) Signed: 07/03/2015 11:39:04 AM By: Elpidio Eric BSN, RN Entered By: Elpidio Eric on 07/02/2015 12:51:57 [name] ([id number]) -------------------------------------------------------------------------------- Clinic Level of Care Assessment Details Patient Name: [name] Date of Service: 07/02/2015 12:45 PM Medical Record Patient Account Number: [id number] Number: Treating RN: Clover Mealy, RN, BSN, Rita 08-12-1945 [phone] y.o. Other Clinician: Date of Birth/Sex: Female) Treating ROBSON, MICHAEL Primary Care Physician: Sherrie Mustache Physician/Extender: G Referring Physician: Sherrie Mustache Weeks in Treatment: 18 Clinic Level  of Care Assessment Items TOOL 4 Quantity Score []  - Use when only an EandM is performed on FOLLOW-UP visit 0 ASSESSMENTS - Nursing Assessment / Reassessment X - Reassessment of Co-morbidities (includes updates in patient status) 1 10 X - Reassessment of Adherence to Treatment Plan 1 5 ASSESSMENTS - Wound and Skin Assessment / Reassessment []  - Simple Wound Assessment / Reassessment - one wound 0 X - Complex Wound Assessment / Reassessment - multiple wounds 2 5 []  - Dermatologic / Skin Assessment (not related to wound area) 0 ASSESSMENTS - Focused Assessment []  - Circumferential Edema Measurements - multi extremities 0 []  - Nutritional Assessment / Counseling / Intervention 0 X - Lower Extremity Assessment (monofilament, tuning fork, pulses) 1 5 []  - Peripheral Arterial Disease Assessment (using hand held doppler) 0 ASSESSMENTS - Ostomy and/or Continence Assessment and Care []  - Incontinence Assessment and Management 0 []  - Ostomy Care Assessment and Management (repouching, etc.) 0 PROCESS - Coordination of Care X - Simple Patient / Family Education for ongoing care 1 15 []  - Complex (extensive) Patient / Family Education for ongoing care 0 []  - Staff obtains Chiropractor, Records, Test Results / Process Orders 0 []  - Staff telephones HHA, Nursing Homes / Clarify orders / etc 0 Guerrini, Sabreena N. ([id number]) []  - Routine Transfer to another Facility (non-emergent condition) 0 []  - Routine Hospital Admission (non-emergent condition) 0 []  - New Admissions / Manufacturing engineer / Ordering NPWT, Apligraf, etc. 0 []  - Emergency Hospital Admission (emergent condition) 0 []  - Simple Discharge Coordination 0 []  - Complex (extensive) Discharge Coordination 0 PROCESS - Special Needs []  - Pediatric / Minor Patient Management 0 []  - Isolation Patient Management 0 []  - Hearing / Language / Visual special needs 0 []  - Assessment of Community assistance (transportation, D/C planning, etc.) 0 []  -  Additional assistance / Altered mentation 0 []  - Support Surface(s) Assessment (bed, cushion, seat, etc.) 0 INTERVENTIONS - Wound Cleansing / Measurement X -  Simple Wound Cleansing - one wound 1 5 []  - Complex Wound Cleansing - multiple wounds 0 X - Wound Imaging (photographs - any number of wounds) 1 5 []  - Wound Tracing (instead of photographs) 0 []  - Simple Wound Measurement - one wound 0 []  - Complex Wound Measurement - multiple wounds 0 INTERVENTIONS - Wound Dressings []  - Small Wound Dressing one or multiple wounds 0 []  - Medium Wound Dressing one or multiple wounds 0 []  - Large Wound Dressing one or multiple wounds 0 []  - Application of Medications - topical 0 []  - Application of Medications - injection 0 Cassada, Nasiya N. ([id number]) INTERVENTIONS - Miscellaneous []  - External ear exam 0 []  - Specimen Collection (cultures, biopsies, blood, body fluids, etc.) 0 []  - Specimen(s) / Culture(s) sent or taken to Lab for analysis 0 []  - Patient Transfer (multiple staff / Michiel SitesHoyer Lift / Similar devices) 0 []  - Simple Staple / Suture removal (25 or less) 0 []  - Complex Staple / Suture removal (26 or more) 0 []  - Hypo / Hyperglycemic Management (close monitor of Blood Glucose) 0 []  - Ankle / Brachial Index (ABI) - do not check if billed separately 0 X - Vital Signs 1 5 Has the patient been seen at the hospital within the last three years: Yes Total Score: 60 Level Of Care: New/Established - Level 2 Electronic Signature(s) Signed: 07/02/2015 1:56:11 PM By: Elpidio EricAfful, Rita BSN, RN Entered By: Elpidio EricAfful, Rita on 07/02/2015 13:56:10 Mittelman, [name] ([id number]) -------------------------------------------------------------------------------- Encounter Discharge Information Details Patient Name: [name] InfanteWOODS, Amran N. Date of Service: 07/02/2015 12:45 PM Medical Record Patient Account Number: [id number] Number: Treating RN: Clover MealyAfful, RN, BSN, Rita 11-May-1945 [phone](70 y.o. Other Clinician: Date of  Birth/Sex: Female) Treating ROBSON, MICHAEL Primary Care Physician: Sherrie MustacheJADALI, FAYEGH Physician/Extender: G Referring Physician: Sherrie MustacheJADALI, FAYEGH Weeks in Treatment: 6218 Encounter Discharge Information Items Discharge Pain Level: 0 Discharge Condition: Stable Ambulatory Status: Wheelchair Discharge Destination: Home Private Transportation: Auto Accompanied By: friend Schedule Follow-up Appointment: No Medication Reconciliation completed and No provided to Patient/Care [name]: Clinical Summary of Care: Electronic Signature(s) Signed: 07/03/2015 11:39:04 AM By: Elpidio EricAfful, Rita BSN, RN Entered By: Elpidio EricAfful, Rita on 07/02/2015 13:11:45 Spearing, [name] ([id number]) -------------------------------------------------------------------------------- Lower Extremity Assessment Details Patient Name: [name] InfanteWOODS, Emry N. Date of Service: 07/02/2015 12:45 PM Medical Record Patient Account Number: [id number] Number: Treating RN: Clover MealyAfful, RN, BSN, Rita 11-May-1945 [phone](70 y.o. Other Clinician: Date of Birth/Sex: Female) Treating ROBSON, MICHAEL Primary Care Physician: Sherrie MustacheJADALI, FAYEGH Physician/Extender: G Referring Physician: Sherrie MustacheJADALI, FAYEGH Weeks in Treatment: 18 Vascular Assessment Pulses: Posterior Tibial Dorsalis Pedis Palpable: [Left:Yes] Extremity colors, hair growth, and conditions: Extremity Color: [Left:Mottled] Hair Growth on Extremity: [Left:No] Temperature of Extremity: [Left:Warm] Capillary Refill: [Left:< 3 seconds] Toe Nail Assessment Left: Right: Thick: Yes Discolored: Yes Deformed: No Improper Length and Hygiene: No Electronic Signature(s) Signed: 07/03/2015 11:39:04 AM By: Elpidio EricAfful, Rita BSN, RN Entered By: Elpidio EricAfful, Rita on 07/02/2015 13:00:09 [name] InfanteWOODS, Kennedie N. ([id number]) -------------------------------------------------------------------------------- Multi Wound Chart Details Patient Name: [name] InfanteWOODS, Carolanne N. Date of Service: 07/02/2015 12:45 PM Medical Record Patient Account Number:  [id number] Number: Treating RN: Clover MealyAfful, RN, BSN, Rita 11-May-1945 [phone](70 y.o. 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] [5:Diabetic Wound/Ulcer of the Lower Extremity] [N/A:N/A] Comorbid History: [4:Hypertension, Peripheral Arterial Disease, Type II Diabetes, Osteoarthritis, Neuropathy] [5:Hypertension, Peripheral Arterial Disease, Type II Diabetes, Osteoarthritis, Neuropathy] [N/A:N/A] Date Acquired: [4:06/18/2015] [5:06/24/2015] [N/A:N/A] Weeks of Treatment: [4:2] [5:1] [N/A:N/A] Wound Status: [4:Healed - Epithelialized] [5:Healed - Epithelialized] [N/A:N/A] Measurements L x W x D 0x0x0 [5:0x0x0] [N/A:N/A] (cm) Area (cm) : [4:0] [5:0] [N/A:N/A] Volume (cm) : [4:0] [5:0] [N/A:N/A] % Reduction in Area: [4:100.00%] [5:100.00%] [N/A:N/A] % Reduction in Volume: 100.00% [5:100.00%] [N/A:N/A] Classification: [4:Grade 1] [5:Grade 1] [N/A:N/A] Exudate Amount: [4:None Present] [5:None Present] [N/A:N/A] Wound Margin: [4:Distinct, outline attached] [5:Distinct, outline attached] [N/A:N/A] Granulation Amount: [4:None Present (0%)] [5:None Present (0%)] [N/A:N/A] Necrotic Amount: [4:None Present (0%)] [5:None Present (0%)] [N/A:N/A] Exposed Structures: [4:Fascia: No Fat: No Tendon: No Muscle: No Joint: No] [5:Fascia: No Fat: No Tendon: No Muscle: No Joint: No] [N/A:N/A] Bone: No Bone: No Limited to Skin Limited to  Skin Breakdown Breakdown Epithelialization: Large (67-100%) Large (67-100%) N/A Periwound Skin Texture: Edema: No Edema: No N/A Excoriation: No Excoriation: No Induration: No Induration: No Callus: No Callus: No Crepitus: No Crepitus: No Fluctuance: No Fluctuance: No Friable: No Friable: No Rash: No Rash: No Scarring: No Scarring: No Periwound Skin Maceration: No Maceration: No N/A Moisture: Moist: No Moist: No Dry/Scaly: No Dry/Scaly: No Periwound Skin Color: Atrophie Blanche: No Atrophie Blanche: No N/A Cyanosis: No Cyanosis: No Ecchymosis: No Ecchymosis: No Erythema: No Erythema: No Hemosiderin Staining: No Hemosiderin Staining: No Mottled: No Mottled: No Pallor: No Pallor: No Rubor: No Rubor: No Temperature: No Abnormality No Abnormality N/A Tenderness on No No N/A Palpation: Wound Preparation: Ulcer Cleansing: Other: Ulcer Cleansing: Other: N/A water and surg scrub surg scrub and water Topical Anesthetic Topical Anesthetic Applied: None Applied: None Treatment Notes Electronic Signature(s) Signed: 07/02/2015 1:54:38 PM By: Elpidio Eric BSN, RN Entered By: Elpidio Eric on 07/02/2015 13:54:38 Lucena, Floyce [name] ([id number]) -------------------------------------------------------------------------------- Multi-Disciplinary Care Plan Details Patient Name: [name] Date of Service: 07/02/2015 12:45 PM Medical Record Patient Account Number: [id number] Number: Treating RN: Clover Mealy, RN, BSN, Rita 10-03-1945 [phone] y.o. Other Clinician: Date of Birth/Sex: Female) Treating ROBSON, MICHAEL Primary Care Physician: Sherrie Mustache Physician/Extender: G Referring Physician: Sherrie Mustache Weeks in Treatment: 80 Active Inactive Electronic Signature(s) Signed: 07/02/2015 1:54:20 PM By: Elpidio Eric BSN, RN Entered By: Elpidio Eric on 07/02/2015 13:54:20 Yates, Floyce [name]  ([id number]) -------------------------------------------------------------------------------- Pain Assessment Details Patient Name: [name] Date of Service: 07/02/2015 12:45 PM Medical Record Patient Account Number: [id number] Number: Treating RN: Clover Mealy RN, BSN, Rita December 29, 1945 [phone] y.o. Other Clinician: Date of Birth/Sex: Female) Treating ROBSON, MICHAEL Primary Care Physician: Sherrie Mustache Physician/Extender: G Referring Physician: Sherrie Mustache Weeks in Treatment: 18 Active Problems Location of Pain Severity and Description of Pain Patient Has Paino No Site Locations Pain Management and Medication Current Pain Management: Electronic Signature(s) Signed: 07/03/2015 11:39:04 AM By: Elpidio Eric BSN, RN Entered By: Elpidio Eric on 07/02/2015 12:52:04 [name] ([id number]) -------------------------------------------------------------------------------- Patient/Caregiver Education Details Patient Name: [name] Date of Service: 07/02/2015 12:45 PM Medical Record Patient Account Number: [id number] Number: Treating RN: Clover Mealy RN, BSN, Rita 03-08-1945 [phone] y.o. Other Clinician: Date of Birth/Gender: Female) Treating ROBSON, MICHAEL Primary Care Physician: Sherrie Mustache Physician/Extender: G Referring Physician: Augustin Coupe in Treatment: 68 Education Assessment Education Provided To: Patient Education Topics Provided [facility]: Methods: Explain/Verbal Responses: State content correctly Electronic Signature(s)  Signed: 07/03/2015 11:39:04 AM By: Elpidio Eric BSN, RN Entered By: Elpidio Eric on 07/02/2015 13:11:58 [name], [name] ([id number]) -------------------------------------------------------------------------------- Wound Assessment Details Patient Name: [name] Date of Service: 07/02/2015 12:45 PM Medical Record Patient Account Number: [id number] Number: Treating RN: Clover Mealy, RN, BSN,  Rita Jun 08, 1945 [phone] y.o. Other Clinician: Date of Birth/Sex: Female) Treating ROBSON, MICHAEL Primary Care Physician: Sherrie Mustache Physician/Extender: G Referring Physician: Sherrie Mustache Weeks in Treatment: 18 Wound Status Wound Number: 4 Primary Diabetic Wound/Ulcer of the Lower Etiology: Extremity Wound Location: Left Lower Leg - Proximal Wound Healed - Epithelialized Wounding Event: Trauma Status: Date Acquired: 06/18/2015 Comorbid Hypertension, Peripheral Arterial Weeks Of Treatment: 2 History: Disease, Type II Diabetes, Clustered Wound: No Osteoarthritis, Neuropathy Photos Photo Uploaded By: Elpidio Eric on 07/02/2015 15:41:20 Wound Measurements Length: (cm) 0 % Reductio Width: (cm) 0 % Reductio Depth: (cm) 0 Epithelial Area: (cm) 0 Tunneling Volume: (cm) 0 n in Area: 100% n in Volume: 100% ization: Large (67-100%) : No Wound Description Classification: Grade 1 Wound Margin: Distinct, outline attached Exudate Amount: None Present Foul Odor After Cleansing: No Wound Bed Granulation Amount: None Present (0%) Exposed Structure Necrotic Amount: None Present (0%) Fascia Exposed: No Fat Layer Exposed: No Tendon Exposed: No Bruntz, Mariposa N. ([id number]) Muscle Exposed: No Joint Exposed: No Bone Exposed: No Limited to Skin Breakdown Periwound Skin Texture Texture Color No Abnormalities Noted: No No Abnormalities Noted: No Callus: No Atrophie Blanche: No Crepitus: No Cyanosis: No Excoriation: No Ecchymosis: No Fluctuance: No Erythema: No Friable: No Hemosiderin Staining: No Induration: No Mottled: No Localized Edema: No Pallor: No Rash: No Rubor: No Scarring: No Temperature / Pain Moisture Temperature: No Abnormality No Abnormalities Noted: No Dry / Scaly: No Maceration: No Moist: No Wound Preparation Ulcer Cleansing: Other: water and surg scrub, Topical Anesthetic Applied: None Electronic Signature(s) Signed: 07/03/2015 11:39:04 AM By:  Elpidio Eric BSN, RN Entered By: Elpidio Eric on 07/02/2015 13:09:16 Muriel, Floyce [name] ([id number]) -------------------------------------------------------------------------------- Wound Assessment Details Patient Name: [name] Date of Service: 07/02/2015 12:45 PM Medical Record Patient Account Number: [id number] Number: Treating RN: Clover Mealy, RN, BSN, Rita 1945-07-01 [phone] y.o. Other Clinician: Date of Birth/Sex: Female) Treating ROBSON, MICHAEL Primary Care Physician: Sherrie Mustache Physician/Extender: G Referring Physician: Sherrie Mustache Weeks in Treatment: 18 Wound Status Wound Number: 5 Primary Diabetic Wound/Ulcer of the Lower Etiology: Extremity Wound Location: Right Toe Great - Dorsal Wound Healed - Epithelialized Wounding Event: Trauma Status: Date Acquired: 06/24/2015 Comorbid Hypertension, Peripheral Arterial Weeks Of Treatment: 1 History: Disease, Type II Diabetes, Clustered Wound: No Osteoarthritis, Neuropathy Wound Measurements Length: (cm) 0 % Reducti Width: (cm) 0 % Reducti Depth: (cm) 0 Epithelia Area: (cm) 0 Tunnelin Volume: (cm) 0 Undermin on in Area: 100% on in Volume: 100% lization: Large (67-100%) g: No ing: No Wound Description Classification: Grade 1 Wound Margin: Distinct, outline attached Exudate Amount: None Present Foul Odor After Cleansing: No Wound Bed Granulation Amount: None Present (0%) Exposed Structure Necrotic Amount: None Present (0%) Fascia Exposed: No Fat Layer Exposed: No Tendon Exposed: No Muscle Exposed: No Joint Exposed: No Bone Exposed: No Limited to Skin Breakdown Periwound Skin Texture Texture Color No Abnormalities Noted: No No Abnormalities Noted: No Callus: No Atrophie Blanche: No Crepitus: No Cyanosis: No Excoriation: No Ecchymosis: No ROXANN, VIERRA. ([id number]) Fluctuance: No Erythema: No Friable: No Hemosiderin Staining: No Induration: No Mottled: No Localized Edema: No Pallor:  No Rash: No Rubor: No Scarring: No Temperature / Pain Moisture Temperature: No Abnormality No Abnormalities  Noted: No Dry / Scaly: No Maceration: No Moist: No Wound Preparation Ulcer Cleansing: Other: surg scrub and water, Topical Anesthetic Applied: None Electronic Signature(s) Signed: 07/03/2015 11:39:04 AM By: Elpidio Eric BSN, RN Entered By: Elpidio Eric on 07/02/2015 13:09:46 Ricklefs, Floyce [name] ([id number]) -------------------------------------------------------------------------------- Vitals Details Patient Name: [name] Date of Service: 07/02/2015 12:45 PM Medical Record Patient Account Number: [id number] Number: Treating RN: Clover Mealy, RN, BSN, Rita October 31, 1945 [phone] y.o. Other Clinician: Date of Birth/Sex: Female) Treating ROBSON, MICHAEL Primary Care Physician: Sherrie Mustache Physician/Extender: G Referring Physician: Sherrie Mustache Weeks in Treatment: 18 Vital Signs Time Taken: 12:53 Temperature (F): 98.2 Height (in): 67 Pulse (bpm): 75 Weight (lbs): 302 Respiratory Rate (breaths/min): 17 Body Mass Index (BMI): 47.3 Blood Pressure (mmHg): 162/100 Reference Range: 80 - 120 mg / dl Electronic Signature(s) Signed: 07/03/2015 11:39:04 AM By: Elpidio Eric BSN, RN Entered By: Elpidio Eric on 07/02/2015 12:54:08

## 2015-07-16 ENCOUNTER — Encounter: Payer: Medicare Other | Admitting: Internal Medicine

## 2015-07-16 DIAGNOSIS — E11622 Type 2 diabetes mellitus with other skin ulcer: Secondary | ICD-10-CM | POA: Diagnosis not present

## 2015-07-16 DIAGNOSIS — I10 Essential (primary) hypertension: Secondary | ICD-10-CM | POA: Diagnosis not present

## 2015-07-16 DIAGNOSIS — S80822A Blister (nonthermal), left lower leg, initial encounter: Secondary | ICD-10-CM | POA: Diagnosis not present

## 2015-07-16 DIAGNOSIS — I70342 Atherosclerosis of unspecified type of bypass graft(s) of the left leg with ulceration of calf: Secondary | ICD-10-CM | POA: Diagnosis not present

## 2015-07-16 DIAGNOSIS — I739 Peripheral vascular disease, unspecified: Secondary | ICD-10-CM | POA: Diagnosis not present

## 2015-07-16 DIAGNOSIS — L97221 Non-pressure chronic ulcer of left calf limited to breakdown of skin: Secondary | ICD-10-CM | POA: Diagnosis not present

## 2015-07-16 DIAGNOSIS — M1990 Unspecified osteoarthritis, unspecified site: Secondary | ICD-10-CM | POA: Diagnosis not present

## 2015-07-16 DIAGNOSIS — I70322 Atherosclerosis of unspecified type of bypass graft(s) of the extremities with rest pain, left leg: Secondary | ICD-10-CM | POA: Diagnosis not present

## 2015-07-16 DIAGNOSIS — E1140 Type 2 diabetes mellitus with diabetic neuropathy, unspecified: Secondary | ICD-10-CM | POA: Diagnosis not present

## 2015-07-16 NOTE — Progress Notes (Addendum)
[name] ([id number]) Visit Report for 07/16/2015 Arrival Information Details Patient Name: [name], [name] Date of Service: 07/16/2015 10:00 AM Medical Record Patient Account Number: [id number] Number: Treating RN: Clover Mealy, RN, BSN, Rita 1945-04-04 [phone] y.o. Other Clinician: Date of Birth/Sex: Female) Treating ROBSON, MICHAEL Primary Care Physician: Sherrie Mustache Physician/Extender: G Referring Physician: Sherrie Mustache Weeks in Treatment: 20 Visit Information History Since Last Visit Added or deleted any medications: No Patient Arrived: Wheel Chair Any new allergies or adverse reactions: No Arrival Time: 10:15 Had a fall or experienced change in No Accompanied By: nephew activities of daily living that may affect Transfer Assistance: None risk of falls: Patient Identification Verified: Yes Signs or symptoms of abuse/neglect since last No Secondary Verification Process Yes visito Completed: Hospitalized since last visit: No Patient Requires Transmission- No Has Dressing in Place as Prescribed: Yes Based Precautions: Pain Present Now: No Patient Has Alerts: Yes Patient Alerts: Patient on Blood Thinner plavix, aspirin  DMII ABI right 0.94 ABI left 1.16 Electronic Signature(s) Signed: 07/16/2015 3:28:31 PM By: Elpidio Eric BSN, RN Entered By: Elpidio Eric on 07/16/2015 10:15:29 [name] ([id number]) -------------------------------------------------------------------------------- Encounter Discharge Information Details Patient Name: [name] Date of Service: 07/16/2015 10:00 AM Medical Record Patient Account Number: [id number] Number: Treating RN: Clover Mealy, RN, BSN, Rita [birth year] [phone] y.o. Other Clinician: Date of Birth/Sex: Female) Treating ROBSON, MICHAEL Primary Care Physician: Sherrie Mustache Physician/Extender: G Referring Physician: Sherrie Mustache Weeks in Treatment: 20 Encounter Discharge Information Items Discharge Pain  Level: 0 Discharge Condition: Stable Ambulatory Status: Wheelchair Discharge Destination: Home Transportation: Private Auto Accompanied By: nephew Schedule Follow-up Appointment: No Medication Reconciliation completed and provided to Patient/Care No Emmelia Holdsworth: Provided on Clinical Summary of Care: 07/16/2015 Form Type Recipient Paper Patient MW Electronic Signature(s) Signed: 07/16/2015 10:44:39 AM By: Gwenlyn Perking Entered By: Gwenlyn Perking on 07/16/2015 10:44:38 Angerer, [name] ([id number]) -------------------------------------------------------------------------------- Lower Extremity Assessment Details Patient Name: [name] Date of Service: 07/16/2015 10:00 AM Medical Record Patient Account Number: [id number] Number: Treating RN: Clover Mealy, RN, BSN, Rita 21-Sep-1945 [phone] y.o. Other Clinician: Date of Birth/Sex: Female) Treating ROBSON, MICHAEL Primary Care Physician: Sherrie Mustache Physician/Extender: G Referring Physician: Sherrie Mustache Weeks in Treatment: 20 Edema Assessment Assessed: [Left: No] [Right: No] E[Left: dema] [Right: :] Calf Left: Right: Point of Measurement: 34 cm From Medial Instep 42 cm cm Ankle Left: Right: Point of Measurement: 8 cm From Medial Instep 27 cm cm Vascular Assessment Pulses: Posterior Tibial Dorsalis Pedis Palpable: [Left:Yes] Extremity colors, hair growth, and conditions: Extremity Color: [Left:Normal] Hair Growth on Extremity: [Left:No] Temperature of Extremity: [Left:Warm] Capillary Refill: [Left:< 3 seconds] Toe Nail Assessment Left: Right: Thick: Yes Discolored: Yes Deformed: No Improper Length and Hygiene: No Electronic Signature(s) Signed: 07/16/2015 3:28:31 PM By: Elpidio Eric BSN, RN Entered By: Elpidio Eric on 07/16/2015 10:22:52 OLANNA, PERCIFIELD ([id number] Schwarz, [name] ([id number]) -------------------------------------------------------------------------------- Multi Wound Chart Details Patient Name:  [name] Date of Service: 07/16/2015 10:00 AM Medical Record Patient Account Number: [id number] Number: Treating RN: Clover Mealy, RN, BSN, Rita 1945/11/05 [phone] y.o. 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] [N/A:N/A] Comorbid History: [  6:Hypertension, Peripheral Arterial Disease, Type II Diabetes, Osteoarthritis, Neuropathy] [N/A:N/A] Date Acquired: [6:07/04/2015] [N/A:N/A] Weeks of Treatment: [6:0] [N/A:N/A] Wound Status: [6:Open] [N/A:N/A] Measurements L x W x D 5x1x0.1 [N/A:N/A] (cm) Area (cm) : [6:3.927] [N/A:N/A] Volume (cm) : [6:0.393] [N/A:N/A] Classification: [6:Grade 0] [N/A:N/A] Exudate Amount: [6:Medium] [N/A:N/A] Exudate Type: [6:Serosanguineous] [N/A:N/A] Exudate Color: [6:red, brown] [N/A:N/A] Wound Margin: [6:Distinct, outline attached] [N/A:N/A] Granulation Amount: [6:Large (67-100%)] [N/A:N/A] Necrotic Amount: [6:None Present (0%)] [N/A:N/A] Exposed Structures: [6:Fascia: No Fat: No Tendon: No Muscle: No Joint: No] [N/A:N/A] Bone: No Limited to Skin Breakdown Epithelialization: None N/A N/A Periwound Skin Texture: Edema: Yes N/A N/A Excoriation: No Induration: No Callus: No Crepitus: No Fluctuance: No Friable: No Rash: No Scarring: No Periwound Skin Moist: Yes N/A N/A Moisture: Maceration: No Dry/Scaly: No Periwound Skin Color: Atrophie Blanche: No N/A N/A Cyanosis: No Ecchymosis: No Erythema: No Hemosiderin  Staining: No Mottled: No Pallor: No Rubor: No Temperature: No Abnormality N/A N/A Tenderness on No N/A N/A Palpation: Wound Preparation: Ulcer Cleansing: N/A N/A Rinsed/Irrigated with Saline Topical Anesthetic Applied: None Treatment Notes Electronic Signature(s) Signed: 07/16/2015 3:28:31 PM By: Elpidio Eric BSN, RN Entered By: Elpidio Eric on 07/16/2015 10:23:51 SHAQUANTA, HARKLESS ([id number]) -------------------------------------------------------------------------------- Multi-Disciplinary Care Plan Details Patient Name: [name] Date of Service: 07/16/2015 10:00 AM Medical Record Patient Account Number: [id number] Number: Treating RN: Clover Mealy, RN, BSN, Rita 1945/04/27 [phone] y.o. Other Clinician: Date of Birth/Sex: Female) Treating ROBSON, MICHAEL Primary Care Physician: Sherrie Mustache Physician/Extender: G Referring Physician: Sherrie Mustache Weeks in Treatment: 20 Active Inactive Electronic Signature(s) Signed: 07/16/2015 3:28:31 PM By: Elpidio Eric BSN, RN Entered By: Elpidio Eric on 07/16/2015 10:23:40 JENIYA, FLANNIGAN ([id number]) -------------------------------------------------------------------------------- Pain Assessment Details Patient Name: [name] Date of Service: 07/16/2015 10:00 AM Medical Record Patient Account Number: [id number] Number: Treating RN: Clover Mealy, RN, BSN, Rita 1945/03/28 [phone] y.o. Other Clinician: Date of Birth/Sex: Female) Treating ROBSON, MICHAEL Primary Care Physician: Sherrie Mustache Physician/Extender: G Referring Physician: Sherrie Mustache Weeks in Treatment: 20 Active Problems Location of Pain Severity and Description of Pain Patient Has Paino No Site Locations With Dressing Change: No Pain Management and Medication Current Pain Management: Electronic Signature(s) Signed: 07/16/2015 3:28:31 PM By: Elpidio Eric BSN, RN Entered By: Elpidio Eric on 07/16/2015 10:16:52 Farace, [name]  ([id number]) -------------------------------------------------------------------------------- Patient/Caregiver Education Details Patient Name: [name] Date of Service: 07/16/2015 10:00 AM Medical Record Patient Account Number: [id number] Number: Treating RN: Clover Mealy, RN, BSN, Rita August 12, 1945 [phone] y.o. Other Clinician: Date of Birth/Gender: Female) Treating ROBSON, MICHAEL Primary Care Physician: Sherrie Mustache Physician/Extender: G Referring Physician: Augustin Coupe in Treatment: 20 Education Assessment Education Provided To: Patient Education Topics Provided Wound/Skin Impairment: Methods: Explain/Verbal Responses: State content correctly Electronic Signature(s) Signed: 07/16/2015 3:28:31 PM By: Elpidio Eric BSN, RN Entered By: Elpidio Eric on 07/16/2015 10:44:30 Corne, [name] ([id number]) -------------------------------------------------------------------------------- Wound Assessment Details Patient Name: [name] Date of Service: 07/16/2015 10:00 AM Medical Record Patient Account Number: [id number] Number: Treating RN: Clover Mealy, RN, BSN, Rita August 31, 1945 [phone] y.o. Other Clinician: Date of Birth/Sex: Female) Treating ROBSON, MICHAEL Primary Care Physician: Sherrie Mustache Physician/Extender: G Referring Physician: Sherrie Mustache Weeks in Treatment: 20 Wound Status Wound Number: 6 Primary Diabetic Wound/Ulcer of the Lower Etiology: Extremity Wound Location: Left Lower Leg - Anterior Wound Open Wounding Event: Gradually Appeared Status: Date Acquired: 07/04/2015 Comorbid Hypertension, Peripheral Arterial Weeks Of Treatment: 0 History: Disease, Type II Diabetes, Clustered Wound: No Osteoarthritis, Neuropathy Photos Photo Uploaded By: Elpidio Eric on 07/16/2015 15:22:34  Wound Measurements Length: (cm) 5 Width: (cm) 1 Depth: (cm) 0.1 Area: (cm) 3.927 Volume: (cm) 0.393 % Reduction in Area: % Reduction in Volume: Epithelialization:  None Tunneling: No Undermining: No Wound Description Classification: Grade 0 Wound Margin: Distinct, outline attached Exudate Amount: Medium Exudate Type: Serosanguineous Exudate Color: red, brown Wound Bed Granulation Amount: Large (67-100%) Exposed Structure Necrotic Amount: None Present (0%) Fascia Exposed: No Roemmich, Marshell N. ([id number]) Fat Layer Exposed: No Tendon Exposed: No Muscle Exposed: No Joint Exposed: No Bone Exposed: No Limited to Skin Breakdown Periwound Skin Texture Texture Color No Abnormalities Noted: No No Abnormalities Noted: No Callus: No Atrophie Blanche: No Crepitus: No Cyanosis: No Excoriation: No Ecchymosis: No Fluctuance: No Erythema: No Friable: No Hemosiderin Staining: No Induration: No Mottled: No Localized Edema: Yes Pallor: No Rash: No Rubor: No Scarring: No Temperature / Pain Moisture Temperature: No Abnormality No Abnormalities Noted: No Dry / Scaly: No Maceration: No Moist: Yes Wound Preparation Ulcer Cleansing: Rinsed/Irrigated with Saline Topical Anesthetic Applied: None Treatment Notes Wound #6 (Left, Anterior Lower Leg) 1. Cleansed with: Clean wound with Normal Saline 4. Dressing Applied: Prisma Ag 5. Secondary Dressing Applied ABD Pad 7. Secured with 3 Layer Compression System - Left Lower Extremity Electronic Signature(s) Signed: 07/16/2015 3:28:31 PM By: Elpidio EricAfful, Rita BSN, RN Entered By: Elpidio EricAfful, Rita on 07/16/2015 10:21:30 [name] InfanteWOODS, Luellen N. ([id number]) -------------------------------------------------------------------------------- Vitals Details Patient Name: [name] InfanteWOODS, Adithi N. Date of Service: 07/16/2015 10:00 AM Medical Record Patient Account Number: [id number] Number: Treating RN: Clover MealyAfful, RN, BSN, Rita [birth year] [phone](70 y.o. Other Clinician: Date of Birth/Sex: Female) Treating ROBSON, MICHAEL Primary Care Physician: Sherrie MustacheJADALI, FAYEGH Physician/Extender: G Referring Physician: Sherrie MustacheJADALI, FAYEGH Weeks in  Treatment: 20 Vital Signs Time Taken: 10:16 Temperature (F): 98.1 Height (in): 67 Pulse (bpm): 72 Weight (lbs): 302 Respiratory Rate (breaths/min): 17 Body Mass Index (BMI): 47.3 Blood Pressure (mmHg): 111/79 Reference Range: 80 - 120 mg / dl Electronic Signature(s) Signed: 07/16/2015 3:28:31 PM By: Elpidio EricAfful, Rita BSN, RN Entered By: Elpidio EricAfful, Rita on 07/16/2015 10:17:32

## 2015-07-17 DIAGNOSIS — I70322 Atherosclerosis of unspecified type of bypass graft(s) of the extremities with rest pain, left leg: Secondary | ICD-10-CM | POA: Diagnosis not present

## 2015-07-17 DIAGNOSIS — L97221 Non-pressure chronic ulcer of left calf limited to breakdown of skin: Secondary | ICD-10-CM | POA: Diagnosis not present

## 2015-07-17 DIAGNOSIS — I10 Essential (primary) hypertension: Secondary | ICD-10-CM | POA: Diagnosis not present

## 2015-07-17 DIAGNOSIS — I739 Peripheral vascular disease, unspecified: Secondary | ICD-10-CM | POA: Diagnosis not present

## 2015-07-17 DIAGNOSIS — E11622 Type 2 diabetes mellitus with other skin ulcer: Secondary | ICD-10-CM | POA: Diagnosis not present

## 2015-07-17 DIAGNOSIS — I70342 Atherosclerosis of unspecified type of bypass graft(s) of the left leg with ulceration of calf: Secondary | ICD-10-CM | POA: Diagnosis not present

## 2015-07-17 DIAGNOSIS — E1140 Type 2 diabetes mellitus with diabetic neuropathy, unspecified: Secondary | ICD-10-CM | POA: Diagnosis not present

## 2015-07-17 DIAGNOSIS — M1990 Unspecified osteoarthritis, unspecified site: Secondary | ICD-10-CM | POA: Diagnosis not present

## 2015-07-17 DIAGNOSIS — E119 Type 2 diabetes mellitus without complications: Secondary | ICD-10-CM | POA: Diagnosis not present

## 2015-07-18 NOTE — Progress Notes (Signed)
[name], [name] ([id number]) Visit Report for 07/16/2015 Chief Complaint Document Details Patient Name: [name], [name] Date of Service: 07/16/2015 10:00 AM Medical Record Patient Account Number: [id number] Number: Treating RN: Clover Mealy, RN, BSN, Rita March 26, 1945 [phone] y.o. Other Clinician: Date of Birth/Sex: Female) Treating ROBSON, MICHAEL Primary Care Physician/Extender: Kerry Kass, [facility] Physician: Referring Physician: Sherrie Mustache Weeks in Treatment: 20 Information Obtained from: Patient Chief Complaint Left calf ulcerations. Arterial insufficiency. Electronic Signature(s) Signed: 07/17/2015 5:25:36 PM By: Baltazar Najjar MD Entered By: Baltazar Najjar on 07/16/2015 15:42:01 [name], [name] Stakes ([id number]) -------------------------------------------------------------------------------- HPI Details Patient Name: [name] Date of Service: 07/16/2015 10:00 AM Medical Record Patient Account Number: [id number] Number: Treating RN: Clover Mealy, RN, BSN, Rita 1945/07/05 [phone] y.o. Other Clinician: Date of Birth/Sex: Female) Treating ROBSON, MICHAEL Primary Care Physician/Extender: Kerry Kass, [facility] Physician: Referring Physician: Sherrie Mustache Weeks in Treatment: 20 History of Present Illness HPI Description: Pleasant 70 year old with diabetes (no hemoglobin A1c available) and severe peripheral vascular disease. She reports a prior left lower extremity stent. Records unavailable. Underwent multiple level angioplasty of left lower extremity 10/29/2014 by Dr. Wyn Quaker. Developed left calf ulcerations in late December 2016. Denies any trauma. Seen by her PCP. Bactrim prescribed. Referred to the wound clinic. Biopsy culture 02/20/2015 grew methicillin sensitive staph aureus, sensitive to Bactrim. Performing dressing changes with Prisma. Using a Tubigrip for edema control. Scheduled to see Dr. Wyn Quaker in follow-up 02/25/2015. Cancelled because of weather. Rescheduled  for 03/06/2015. She returns to clinic for follow-up and is without new complaints. Pain improved. No ischemic rest pain. No fever or chills. Minimal drainage. 03/13/2015 -- records obtained from [place] [facility] shows that she was seen by Dr. dew on 03/12/2015 and given tramadol and other symptomatic treatment. Recent ABI done showed right to be 0.94 and left to be 0.47 a left lower extremity arterial duplex was notable for an occluded stent and distal ATA. Recommendations were for left lower extremity angiogram with intervention. on 03/11/2015 she was taken up for a aortogram with angioplasty of the left posterior tibial artery and tibioperoneal trunk, above-knee popliteal artery and almost entire SFA, stent placement to the SFA and popliteal artery. 03/20/15 her arterial interventions noted. The patient states her left leg feels better. He ischemic wound on her left anterior leg. She has been using Santyl 04/09/15; the patient arrives back today. 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 dew. I was concerned about her last week as she had weeping edema fluid and areas of threatened ulceration around her wound. I ordered a silver alginate, Profore light wrap. She comes back today with an Radio broadcast assistant on apparently applied by Dr. Driscilla Grammes office. Her edema is under much better control and the weeping area here last week surrounding her major wound has resolved. She does have another open area which is small and superficial. I'm not sure I would put an Unna boot on this lady's degree of arterial insufficiency. 04/16/15; her original small wound appears to be healthy with good granulation her measurements are improved. She has a small area underneath this. Both underwent selective debridement since surface slough 04/23/15; the original wound has filled in there is healthy granulation here. The small area underneath this is healed. No debridement  was required 04/30/15; the wound no longer has any depth. no Debridement required 05/07/15; the wound appears to be stable. Advancing epithelialization. Decrease wound volume. No [name], [name] ([id number]) debridement is required. She has known arterial disease  status post stent placement to the SFA and popliteal artery. 05/14/15 unfortunately the wound is not really decreased much this week she still has a rim of epithelialization. The wound bed looks stable nevertheless I did a surgical debridement and there is some adherent surface slough. She has known PAD however the blood supply appears to be adequate status post stent to the SFA and popliteal and popliteal artery 05/21/15 wound is healthier than last week. No debridement is necessary. Known PAD status post stent to the SFA and popliteal artery 05/28/15; wound is no different from last week and in fact looks "stalled". Fairly we put Iodoflex on him last week however home health used Prisma 06/04/15 if anything the wound area appears to be larger. 06/11/15 switch to silver alginate last week and this seems to have helped. There is a thin layer of epithelialization only visible under the light 06/18/15; the patient had her leg traumatized when a grandchild fell over her leg at a party last weekend. Her original wound is just about closed very tiny however she has a new larger superficial wound just distal to the original wound. No evidence of infection 06/25/15; both of the wounds on her left leg including the chronic wound we have been treating her for a prolonged period of time now, and the traumatic wound from last week both are fully epithelialized. She apparently had a slip this week and managed to lift her nail off her right great toe. 07/02/15; patient's legs are carefully inspected. Her original wound on the left medial leg is completely healed. Traumatic area on her right great toenail from last week is also healed. She has combined  venous and arterial insufficiency we have obtained her juxtalite stockings bilaterally. 07/16/15 this is a patient we recently discharged. She states that the juxtalite stocking cause irritation and blistering on her anterior left leg. She is not therefore used them in the last 6 days Electronic Signature(s) Signed: 07/17/2015 5:25:36 PM By: Baltazar Najjar MD Entered By: Baltazar Najjar on 07/16/2015 15:43:02 [name], [name] Stakes ([id number]) -------------------------------------------------------------------------------- Physical Exam Details Patient Name: [name] Date of Service: 07/16/2015 10:00 AM Medical Record Patient Account Number: [id number] Number: Treating RN: Clover Mealy, RN, BSN, Rita 21-Nov-1945 [phone] y.o. Other Clinician: Date of Birth/Sex: Female) Treating ROBSON, MICHAEL Primary Care Physician/Extender: Kerry Kass, [facility] Physician: Referring Physician: Sherrie Mustache Weeks in Treatment: 20 Constitutional Sitting or standing Blood Pressure is within target range for patient.. Pulse regular and within target range for patient.Marland Kitchen Respirations regular, non-labored and within target range.. Eyes Conjunctivae clear. No discharge. No scleral icterus. Respiratory Respiratory effort is easy and symmetric bilaterally. Rate is normal at rest and on room air.. Bilateral breath sounds are clear and equal in all lobes with no wheezes, rales or rhonchi.. Cardiovascular Heart rhythm and rate regular, without murmur or gallop.. Pedal pulses palpable and strong bilaterally.. Moderate edema in her left leg which is worse than I'm used to seeing.. Integumentary (Hair, Skin) Superficial excoriations on the left anterior leg. Psychiatric No evidence of depression, anxiety, or agitation. Calm, cooperative, and communicative. Appropriate interactions and affect.. Notes Wound exam; her edema in the left leg is not as well-controlled as I have seen previously but she has not used any  compression. The wound on the medial aspect of the right leg is still closed over. She has some superficial excoriations on the anterior tibia which appear to be epithelialized. Electronic Signature(s) Signed: 07/17/2015 5:25:36 PM By: Baltazar Najjar MD Entered By: Baltazar Najjar  on 07/16/2015 15:51:04 [name], [name] ([id number]) -------------------------------------------------------------------------------- Physician Orders Details Patient Name: KAVITHA, LANSDALE Date of Service: 07/16/2015 10:00 AM Medical Record Patient Account Number: [id number] Number: Treating RN: Clover Mealy, RN, BSN, [place] S[place] April 25, 1945 [phone] y.o. Other Clinician: Date of Birth/Sex: Female) Treating ROBSON, MICHAEL Primary Care Physician/Extender: Kerry Kass, [facility] Physician: Referring Physician: Augustin Coupe in Treatment: 20 Verbal / Phone Orders: Yes Clinician: Afful, RN, BSN, Rita Read Back and Verified: Yes Diagnosis Coding Wound Cleansing Wound #6 Left,Anterior Lower Leg o Cleanse wound with mild soap and water o May shower with protection. o No tub bath. Primary Wound Dressing Wound #6 Left,Anterior Lower Leg o Prisma Ag Secondary Dressing Wound #6 Left,Anterior Lower Leg o ABD pad Dressing Change Frequency Wound #6 Left,Anterior Lower Leg o Change dressing every week Follow-up Appointments Wound #6 Left,Anterior Lower Leg o Return Appointment in 1 week. Edema Control Wound #6 Left,Anterior Lower Leg o 3 Layer Compression System - Left Lower Extremity Additional Orders / Instructions Wound #6 Left,Anterior Lower Leg o Increase protein intake. o OK to return to work with the following restrictions: o Activity as tolerated [name], [name] ([id number]) Electronic Signature(s) Signed: 07/16/2015 3:28:31 PM By: Elpidio Eric BSN, RN Signed: 07/17/2015 5:25:36 PM By: Baltazar Najjar MD Entered By: Elpidio Eric on 07/16/2015 10:36:26 [name], [name]  ([id number]) -------------------------------------------------------------------------------- Problem List Details Patient Name: [name] Date of Service: 07/16/2015 10:00 AM Medical Record Patient Account Number: [id number] Number: Treating RN: Clover Mealy, RN, BSN, Rita [birth year] [phone] y.o. 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) of 02/21/2015 Yes the extremities with rest pain, left leg 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 Other Clinician: Date of Birth/Sex: Female) Treating ROBSON, MICHAEL Primary Care Physician/Extender: Kerry Kass, [facility] Physician: Referring Physician: Sherrie Mustache Weeks in Treatment: 20 Subjective Chief Complaint Information obtained from Patient Left calf ulcerations. Arterial insufficiency. History of Present Illness (HPI) Pleasant 70 year old with diabetes (no hemoglobin A1c  available) and severe peripheral vascular disease. She reports a prior left lower extremity stent. Records unavailable. Underwent multiple level angioplasty of left lower extremity 10/29/2014 by Dr. Wyn Quaker. Developed left calf ulcerations in late December 2016. Denies any trauma. Seen by her PCP. Bactrim prescribed. Referred to the wound clinic. Biopsy culture 02/20/2015 grew methicillin sensitive staph aureus, sensitive to Bactrim. Performing dressing changes with Prisma. Using a Tubigrip for edema control. Scheduled to see Dr. Wyn Quaker in follow-up 02/25/2015. Cancelled because of weather. Rescheduled for 03/06/2015. She returns to clinic for follow-up and is without new complaints. Pain improved. No ischemic rest pain. No fever or chills. Minimal drainage. 03/13/2015 -- records obtained from  [facility] shows that she was seen by Dr. dew on 03/12/2015  and given tramadol and other symptomatic treatment. Recent ABI done showed right to be 0.94 and left to be 0.47 a left lower extremity arterial duplex was notable for an occluded stent and distal ATA. Recommendations were for left lower extremity angiogram with intervention. on 03/11/2015 she was taken up for a aortogram with angioplasty of the left posterior tibial artery and tibioperoneal trunk, above-knee popliteal artery and almost entire SFA, stent placement to the SFA and popliteal artery. 03/20/15 her arterial interventions noted. The patient states her left leg feels better. He ischemic wound on her left anterior leg. She has been using Santyl 04/09/15; the patient arrives back today. 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 dew. I was concerned about her last week as she had weeping edema fluid and areas of threatened ulceration around her wound. I ordered a silver alginate, Profore light wrap. She comes back today with an Radio broadcast assistant on apparently applied by Dr. Driscilla Grammes office. Her  edema is under much better control and the weeping area here last week surrounding her major wound has resolved. She does have another open area which is small and superficial. I'm not sure I would put an Unna boot on this lady's degree of arterial insufficiency. 04/16/15; her original small wound appears to be healthy with good granulation her measurements are improved. She has a small area underneath this. Both underwent selective debridement since surface [name], [name] ([id number]) slough 04/23/15; the original wound has filled in there is healthy granulation here. The small area underneath this is healed. No debridement was required 04/30/15; the wound no longer has any depth. no Debridement required 05/07/15; the wound appears to be stable. Advancing epithelialization. Decrease wound volume. No debridement is required. She has known arterial disease status post stent placement to the SFA and popliteal artery. 05/14/15 unfortunately the wound is not really decreased much this week she still has a rim of epithelialization. The wound bed looks stable nevertheless I did a surgical debridement and there is some adherent surface slough. She has known PAD however the blood supply appears to be adequate status post stent to the SFA and popliteal and popliteal artery 05/21/15 wound is healthier than last week. No debridement is necessary. Known PAD status post stent to the SFA and popliteal artery 05/28/15; wound is no different from last week and in fact looks "stalled". Fairly we put Iodoflex on him last week however home health used Prisma 06/04/15 if anything the wound area appears to be larger. 06/11/15 switch to silver alginate last week and this seems to have helped. There is a thin layer of epithelialization only visible under the light 06/18/15; the patient had her leg traumatized when a grandchild fell over her leg at a party last weekend. Her original wound is just about closed very tiny however  she has a new larger superficial wound just distal to the original wound. No evidence of infection 06/25/15; both of the wounds on her left leg including the chronic wound we have been treating her for a prolonged period of time now, and the traumatic wound from last week both are fully epithelialized. She apparently had a slip this week and managed to lift her nail off her right great toe. 07/02/15; patient's legs are carefully inspected. Her original wound on the left medial leg is completely healed. Traumatic area on her right great toenail from last week is also healed. She has combined venous and arterial insufficiency we have obtained her  juxtalite stockings bilaterally. 07/16/15 this is a patient we recently discharged. She states that the juxtalite stocking cause irritation and blistering on her anterior left leg. She is not therefore used them in the last 6 days Objective Constitutional Sitting or standing Blood Pressure is within target range for patient.. Pulse regular and within target range for patient.Marland Kitchen Respirations regular, non-labored and within target range.. Vitals Time Taken: 10:16 AM, Height: 67 in, Weight: 302 lbs, BMI: 47.3, Temperature: 98.1 F, Pulse: 72 bpm, Respiratory Rate: 17 breaths/min, Blood Pressure: 111/79 mmHg. Eyes Conjunctivae clear. No discharge. No scleral icterus. Respiratory [name], [name] ([id number]) Respiratory effort is easy and symmetric bilaterally. Rate is normal at rest and on room air.. Bilateral breath sounds are clear and equal in all lobes with no wheezes, rales or rhonchi.. Cardiovascular Heart rhythm and rate regular, without murmur or gallop.. Pedal pulses palpable and strong bilaterally.. Moderate edema in her left leg which is worse than I'm used to seeing.Marland Kitchen Psychiatric No evidence of depression, anxiety, or agitation. Calm, cooperative, and communicative. Appropriate interactions and affect.. General Notes: Wound exam; her edema in the  left leg is not as well-controlled as I have seen previously but she has not used any compression. The wound on the medial aspect of the right leg is still closed over. She has some superficial excoriations on the anterior tibia which appear to be epithelialized. Integumentary (Hair, Skin) Superficial excoriations on the left anterior leg. Wound #6 status is Open. Original cause of wound was Gradually Appeared. The wound is located on the Left,Anterior Lower Leg. The wound measures 5cm length x 1cm width x 0.1cm depth; 3.927cm^2 area and 0.393cm^3 volume. The wound is limited to skin breakdown. There is no tunneling or undermining noted. There is a medium amount of serosanguineous drainage noted. The wound margin is distinct with the outline attached to the wound base. There is large (67-100%) granulation within the wound bed. There is no necrotic tissue within the wound bed. The periwound skin appearance exhibited: Localized Edema, Moist. The periwound skin appearance did not exhibit: Callus, Crepitus, Excoriation, Fluctuance, Friable, Induration, Rash, Scarring, Dry/Scaly, Maceration, Atrophie Blanche, Cyanosis, Ecchymosis, Hemosiderin Staining, Mottled, Pallor, Rubor, Erythema. Periwound temperature was noted as No Abnormality. 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 ([id number]) Wound Cleansing: Wound #6 Left,Anterior Lower Leg: Cleanse wound with mild soap and water May shower with protection. No tub bath. Primary Wound Dressing: Wound #6 Left,Anterior Lower  Leg: Prisma Ag Secondary Dressing: Wound #6 Left,Anterior Lower Leg: ABD pad Dressing Change Frequency: Wound #6 Left,Anterior Lower Leg: Change dressing every week Follow-up Appointments: Wound #6 Left,Anterior Lower Leg: Return Appointment in 1 week. Edema Control: Wound #6 Left,Anterior Lower Leg: 3 Layer Compression System - Left Lower Extremity Additional Orders / Instructions: Wound #6 Left,Anterior Lower Leg: Increase protein intake. OK to return to work with the following restrictions: Activity as tolerated #1 there is no signs of systemic fluid overload. Yet her leg is edematous. She has superficial areas on the way left anterior leg which appear to be epithelialized. I am not totally convinced that this was all secondary to  her juxtalite's however the patient seems convinced about this. I didn't feel we had any choice but to rewrap the leg with a Profore light and collagen over the threatened area. #2 I don't really have a good idea of how we are going to address this issue. Fortunately her original wound which was a very difficult area on the right medial leg is not open Electronic Signature(s) Signed: 07/17/2015 5:25:36 PM By: Baltazar Najjarobson, Michael MD Entered By: Baltazar Najjarobson, Michael on 07/16/2015 15:53:03 [name], [name] [name] ([id number]) -------------------------------------------------------------------------------- SuperBill Details Patient Name: [name] InfanteWOODS, [name] Date of Service: 07/16/2015 Medical Record Patient Account Number: [id number] Number: Treating RN: Clover MealyAfful, RN, BSN, Rita 04/04/1945 [phone](70 y.o. 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(s) of the extremities with rest pain, left I70.322 leg L97.511 Non-pressure chronic ulcer of other part of right foot limited to breakdown of skin Facility Procedures CPT4: Description Modifier Quantity Code [id number] (Facility Use Only) [id number] - APPLY MULTLAY COMPRS LWR LT 1 LEG Physician Procedures CPT4: Description Modifier Quantity Code [id number] 99213 - WC PHYS LEVEL 3 - EST PT 1 ICD-10 Description Diagnosis 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

## 2015-07-18 NOTE — Progress Notes (Signed)
[name], [name] ([id number]) Visit Report for 07/17/2015 Arrival Information Details Patient Name: [name], [name] Date of Service: 07/17/2015 3:00 PM Medical Record Patient Account Number: [id number] Number: Treating RN: Clover MealyAfful, RN, BSN, Rita [birth year] [phone](70 y.o. Other Clinician: Date of Birth/Sex: Female) Treating ROBSON, MICHAEL Primary Care Physician/Extender: Kerry KassG JADALI, AvalaFAYEGH Physician: Referring Physician: Sherrie MustacheJADALI, FAYEGH Weeks in Treatment: 21 Visit Information History Since Last Visit Added or deleted any medications: No Patient Arrived: Wheel Chair Any new allergies or adverse reactions: No Arrival Time: 15:31 Signs or symptoms of abuse/neglect since last No Accompanied By: friend visito Transfer Assistance: None Hospitalized since last visit: No Patient Identification Verified: Yes Has Dressing in Place as Prescribed: Yes Secondary Verification Process Yes Has Compression in Place as Prescribed: No Completed: Pain Present Now: No Patient Requires Transmission- No Based Precautions: Patient Has Alerts: Yes Patient Alerts: Patient on Blood Thinner plavix, aspirin 81mg  DMII ABI right 0.94 ABI left 1.16 Electronic Signature(s) Signed: 07/17/2015 5:20:16 PM By: Elpidio EricAfful, Rita BSN, RN Entered By: Elpidio EricAfful, Rita on 07/17/2015 15:31:45 [name], [name] ([id number]) -------------------------------------------------------------------------------- Encounter Discharge Information Details Patient Name: [name], [name] Date of Service: 07/17/2015 3:00 PM Medical Record Patient Account Number: [id number] Number: Treating RN: Clover MealyAfful, RN, BSN, Rita [birth year] [phone](70 y.o. Other Clinician: Date of Birth/Sex: Female) Treating ROBSON, MICHAEL Primary Care Physician/Extender: Kerry KassG JADALI, [facility] Physician: Referring Physician: Sherrie MustacheJADALI, FAYEGH Weeks in Treatment: 21 Encounter Discharge Information Items Discharge Pain Level: 0 Discharge Condition: Stable Ambulatory Status:  Wheelchair Discharge Destination: Home Private Transportation: Auto Accompanied By: friend Schedule Follow-up Appointment: No Medication Reconciliation completed and No provided to Patient/Care Dimond Crotty: Clinical Summary of Care: Electronic Signature(s) Signed: 07/17/2015 5:13:39 PM By: Elpidio EricAfful, Rita BSN, RN Entered By: Elpidio EricAfful, Rita on 07/17/2015 17:13:38 [name], [name] ([id number]) -------------------------------------------------------------------------------- Patient/Caregiver Education Details Patient Name: [name], [name] Date of Service: 07/17/2015 3:00 PM Medical Record Patient Account Number: [id number] Number: Treating RN: Clover MealyAfful, RN, BSN, Rita [birth year] [phone](70 y.o. Other Clinician: Date of Birth/Gender: Female) Treating ROBSON, MICHAEL Primary Care Physician/Extender: Uvaldo RisingG JADALI, FAYEGH Physician: Tania AdeWeeks in Treatment: 21 Referring Physician: Sherrie MustacheJADALI, FAYEGH Education Assessment Education Provided To: Patient Education Topics Provided Wound Debridement: Wound/Skin Impairment: Methods: Explain/Verbal Responses: State content correctly Electronic Signature(s) Signed: 07/17/2015 5:13:15 PM By: Elpidio EricAfful, Rita BSN, RN Entered By: Elpidio EricAfful, Rita on 07/17/2015 17:13:15

## 2015-07-23 ENCOUNTER — Encounter: Payer: Medicare Other | Attending: Internal Medicine | Admitting: Internal Medicine

## 2015-07-23 DIAGNOSIS — E11622 Type 2 diabetes mellitus with other skin ulcer: Secondary | ICD-10-CM | POA: Insufficient documentation

## 2015-07-23 DIAGNOSIS — I70322 Atherosclerosis of unspecified type of bypass graft(s) of the extremities with rest pain, left leg: Secondary | ICD-10-CM | POA: Diagnosis not present

## 2015-07-23 DIAGNOSIS — I10 Essential (primary) hypertension: Secondary | ICD-10-CM | POA: Diagnosis not present

## 2015-07-23 DIAGNOSIS — L97221 Non-pressure chronic ulcer of left calf limited to breakdown of skin: Secondary | ICD-10-CM | POA: Diagnosis not present

## 2015-07-23 DIAGNOSIS — E1140 Type 2 diabetes mellitus with diabetic neuropathy, unspecified: Secondary | ICD-10-CM | POA: Insufficient documentation

## 2015-07-23 DIAGNOSIS — E1151 Type 2 diabetes mellitus with diabetic peripheral angiopathy without gangrene: Secondary | ICD-10-CM | POA: Diagnosis not present

## 2015-07-23 DIAGNOSIS — I739 Peripheral vascular disease, unspecified: Secondary | ICD-10-CM | POA: Diagnosis not present

## 2015-07-23 DIAGNOSIS — I70342 Atherosclerosis of unspecified type of bypass graft(s) of the left leg with ulceration of calf: Secondary | ICD-10-CM | POA: Insufficient documentation

## 2015-07-23 DIAGNOSIS — M1990 Unspecified osteoarthritis, unspecified site: Secondary | ICD-10-CM | POA: Insufficient documentation

## 2015-07-23 DIAGNOSIS — L97511 Non-pressure chronic ulcer of other part of right foot limited to breakdown of skin: Secondary | ICD-10-CM | POA: Diagnosis not present

## 2015-07-23 DIAGNOSIS — Z872 Personal history of diseases of the skin and subcutaneous tissue: Secondary | ICD-10-CM | POA: Diagnosis not present

## 2015-07-23 NOTE — Progress Notes (Addendum)
[name], [name] ([id number]) Visit Report for 07/23/2015 Arrival Information Details Patient Name: [name], [name] Date of Service: 07/23/2015 12:45 PM Medical Record Patient Account Number: [id number] Number: Treating RN: Clover MealyAfful, RN, BSN, Rita 1945/03/21 [phone](70 y.o. Other Clinician: Date of Birth/Sex: Female) Treating ROBSON, MICHAEL Primary Care Physician: Sherrie MustacheJADALI, FAYEGH Physician/Extender: G Referring Physician: Sherrie MustacheJADALI, FAYEGH Weeks in Treatment: 21 Visit Information History Since Last Visit Added or deleted any medications: No Patient Arrived: Wheel Chair Any new allergies or adverse reactions: No Arrival Time: 12:53 Had a fall or experienced change in No Accompanied By: friend activities of daily living that may affect Transfer Assistance: None risk of falls: Patient Identification Verified: Yes Signs or symptoms of abuse/neglect since last No Secondary Verification Process Yes visito Completed: Hospitalized since last visit: No Patient Requires Transmission- No Has Dressing in Place as Prescribed: Yes Based Precautions: Has Compression in Place as Prescribed: Yes Patient Has Alerts: Yes Pain Present Now: No Patient Alerts: Patient on Blood Thinner plavix, aspirin 81mg  DMII ABI right 0.94 ABI left 1.16 Electronic Signature(s) Signed: 07/23/2015 2:19:20 PM By: Elpidio EricAfful, Rita BSN, RN Entered By: Elpidio EricAfful, Rita on 07/23/2015 12:54:15 Myron, [name] [name] ([id number]) -------------------------------------------------------------------------------- Clinic Level of Care Assessment Details Patient Name: [name], [name] Date of Service: 07/23/2015 12:45 PM Medical Record Patient Account Number: [id number] Number: Treating RN: Clover MealyAfful, RN, BSN, Rita 1945/03/21 [phone](70 y.o. Other Clinician: Date of Birth/Sex: Female) Treating ROBSON, MICHAEL Primary Care Physician: Sherrie MustacheJADALI, FAYEGH Physician/Extender: G Referring Physician: Sherrie MustacheJADALI, FAYEGH Weeks in Treatment: 21 Clinic Level of  Care Assessment Items TOOL 4 Quantity Score []  - Use when only an EandM is performed on FOLLOW-UP visit 0 ASSESSMENTS - Nursing Assessment / Reassessment X - Reassessment of Co-morbidities (includes updates in patient status) 1 10 X - Reassessment of Adherence to Treatment Plan 1 5 ASSESSMENTS - Wound and Skin Assessment / Reassessment X - Simple Wound Assessment / Reassessment - one wound 1 5 []  - Complex Wound Assessment / Reassessment - multiple wounds 0 []  - Dermatologic / Skin Assessment (not related to wound area) 0 ASSESSMENTS - Focused Assessment []  - Circumferential Edema Measurements - multi extremities 0 []  - Nutritional Assessment / Counseling / Intervention 0 X - Lower Extremity Assessment (monofilament, tuning fork, pulses) 1 5 []  - Peripheral Arterial Disease Assessment (using hand held doppler) 0 ASSESSMENTS - Ostomy and/or Continence Assessment and Care []  - Incontinence Assessment and Management 0 []  - Ostomy Care Assessment and Management (repouching, etc.) 0 PROCESS - Coordination of Care []  - Simple Patient / Family Education for ongoing care 0 []  - Complex (extensive) Patient / Family Education for ongoing care 0 []  - Staff obtains ChiropractorConsents, Records, Test Results / Process Orders 0 []  - Staff telephones HHA, Nursing Homes / Clarify orders / etc 0 [name], [name] ([id number]) []  - Routine Transfer to another Facility (non-emergent condition) 0 []  - Routine Hospital Admission (non-emergent condition) 0 []  - New Admissions / [employer] Authorizations / Ordering NPWT, Apligraf, etc. 0 []  - Emergency Hospital Admission (emergent condition) 0 X - Simple Discharge Coordination 1 10 []  - Complex (extensive) Discharge Coordination 0 PROCESS - Special Needs []  - Pediatric / Minor Patient Management 0 []  - Isolation Patient Management 0 []  - Hearing / Language / Visual special needs 0 []  - Assessment of Community assistance (transportation, D/C planning, etc.) 0 []  -  Additional assistance / Altered mentation 0 []  - Support Surface(s) Assessment (bed, cushion, seat, etc.) 0 INTERVENTIONS - Wound Cleansing / Measurement X -  Simple Wound Cleansing - one wound 1 5 []  - Complex Wound Cleansing - multiple wounds 0 []  - Wound Imaging (photographs - any number of wounds) 0 []  - Wound Tracing (instead of photographs) 0 []  - Simple Wound Measurement - one wound 0 []  - Complex Wound Measurement - multiple wounds 0 INTERVENTIONS - Wound Dressings []  - Small Wound Dressing one or multiple wounds 0 []  - Medium Wound Dressing one or multiple wounds 0 []  - Large Wound Dressing one or multiple wounds 0 []  - Application of Medications - topical 0 []  - Application of Medications - injection 0 [name], [name] ([id number]) INTERVENTIONS - Miscellaneous []  - External ear exam 0 []  - Specimen Collection (cultures, biopsies, blood, body fluids, etc.) 0 []  - Specimen(s) / Culture(s) sent or taken to Lab for analysis 0 []  - Patient Transfer (multiple staff / Michiel Sites Lift / Similar devices) 0 []  - Simple Staple / Suture removal (25 or less) 0 []  - Complex Staple / Suture removal (26 or more) 0 []  - Hypo / Hyperglycemic Management (close monitor of Blood Glucose) 0 []  - Ankle / Brachial Index (ABI) - do not check if billed separately 0 X - Vital Signs 1 5 Has the patient been seen at the hospital within the last three years: Yes Total Score: 45 Level Of Care: New/Established - Level 2 Electronic Signature(s) Signed: 07/23/2015 1:34:32 PM By: Elpidio Eric BSN, RN Entered By: Elpidio Eric on 07/23/2015 13:34:31 [name] -------------------------------------------------------------------------------- Encounter Discharge Information Details Patient Name: [name] Date of Service: 07/23/2015 12:45 PM Medical Record Patient Account Number: [id number] Number: Treating RN: Clover Mealy, RN, BSN, Rita 1945/06/23 [phone] y.o. Other Clinician: Date of  Birth/Sex: Female) Treating ROBSON, MICHAEL Primary Care Physician: Sherrie Mustache Physician/Extender: G Referring Physician: Sherrie Mustache Weeks in Treatment: 21 Encounter Discharge Information Items Discharge Pain Level: 0 Discharge Condition: Stable Ambulatory Status: Wheelchair Discharge Destination: Home Transportation: Private Auto Accompanied By: friend Schedule Follow-up Appointment: No Medication Reconciliation completed No and provided to Patient/Care [name]: Provided on Clinical Summary of Care: 07/23/2015 Form Type Recipient Paper Patient MW Electronic Signature(s) Signed: 07/23/2015 1:35:38 PM By: Elpidio Eric BSN, RN Previous Signature: 07/23/2015 1:22:26 PM Version By: Gwenlyn Perking Entered By: Elpidio Eric on 07/23/2015 13:35:38 [name], [name] ([id number]) -------------------------------------------------------------------------------- Lower Extremity Assessment Details Patient Name: [name] Date of Service: 07/23/2015 12:45 PM Medical Record Patient Account Number: [id number] Number: Treating RN: Clover Mealy RN, BSN, Rita 1945/03/12 [phone] y.o. Other Clinician: Date of Birth/Sex: Female) Treating ROBSON, MICHAEL Primary Care Physician: Sherrie Mustache Physician/Extender: G Referring Physician: Sherrie Mustache Weeks in Treatment: 21 Edema Assessment Assessed: [Left: No] [Right: No] E[Left: dema] [Right: :] Calf Left: Right: Point of Measurement: 34 cm From Medial Instep 42 cm 43 cm Ankle Left: Right: Point of Measurement: 8 cm From Medial Instep 27 cm 27.6 cm Vascular Assessment Claudication: Claudication Assessment [Left:None] [Right:None] Pulses: Posterior Tibial Dorsalis Pedis Palpable: [Left:Yes] [Right:Yes] Extremity colors, hair growth, and conditions: Extremity Color: [Left:Mottled] [Right:Mottled] Hair Growth on Extremity: [Left:No] [Right:No] Temperature of Extremity: [Left:Warm] [Right:Warm] Capillary Refill: [Left:< 3 seconds]  [Right:< 3 seconds] Toe Nail Assessment Left: Right: Thick: Yes Yes Discolored: Yes Yes Deformed: No No Improper Length and Hygiene: No No Notes Ankle to back of knee is 36cm. [name], [name] ([id number]) Electronic Signature(s) Signed: 07/24/2015 8:30:40 AM By: Elpidio Eric BSN, RN Previous Signature: 07/23/2015 2:19:20 PM Version By: Elpidio Eric BSN, RN Entered By: Elpidio Eric on 07/24/2015 08:30:40 [name], [name] (  [id number]) -------------------------------------------------------------------------------- Multi Wound Chart Details Patient Name: [name], [name] Date of Service: 07/23/2015 12:45 PM Medical Record Patient Account Number: [id number] Number: Treating RN: Clover Mealy, RN, BSN, Rita 1945/09/18 [phone] y.o. 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] [N/A:N/A] Comorbid History: [6:Hypertension, Peripheral Arterial Disease, Type II Diabetes, Osteoarthritis, Neuropathy] [N/A:N/A] Date Acquired: [6:07/04/2015] [N/A:N/A] Weeks of Treatment: [6:1] [N/A:N/A] Wound Status: [6:Open] [N/A:N/A] Measurements L x W x D 0x0x0 [N/A:N/A] (cm) Area (cm) : [6:0] [N/A:N/A] Volume (cm) : [6:0] [N/A:N/A] % Reduction in Area: [6:100.00%] [N/A:N/A] % Reduction in Volume: 100.00% [N/A:N/A] Classification: [6:Grade 0] [N/A:N/A] Exudate Amount: [6:None Present] [N/A:N/A] Wound Margin: [6:Distinct, outline attached] [N/A:N/A] Granulation Amount: [6:None Present (0%)]  [N/A:N/A] Necrotic Amount: [6:None Present (0%)] [N/A:N/A] Exposed Structures: [6:Fascia: No Fat: No Tendon: No Muscle: No Joint: No] [N/A:N/A] Bone: No Limited to Skin Breakdown Epithelialization: Large (67-100%) N/A N/A Periwound Skin Texture: Edema: Yes N/A N/A Excoriation: No Induration: No Callus: No Crepitus: No Fluctuance: No Friable: No Rash: No Scarring: No Periwound Skin Dry/Scaly: Yes N/A N/A Moisture: Maceration: No Moist: No Periwound Skin Color: Atrophie Blanche: No N/A N/A Cyanosis: No Ecchymosis: No Erythema: No Hemosiderin Staining: No Mottled: No Pallor: No Rubor: No Temperature: No Abnormality N/A N/A Tenderness on No N/A N/A Palpation: Wound Preparation: Ulcer Cleansing: Other: N/A N/A Surg soap and water Topical Anesthetic Applied: None Treatment Notes Electronic Signature(s) Signed: 07/23/2015 1:34:03 PM By: Elpidio Eric BSN, RN Entered By: Elpidio Eric on 07/23/2015 13:34:03 [name], [name] ([id number]) -------------------------------------------------------------------------------- Pain Assessment Details Patient Name: [name] Date of Service: 07/23/2015 12:45 PM Medical Record Patient Account Number: [id number] Number: Treating RN: Clover Mealy RN, BSN, Rita 1946/02/16 [phone] y.o. Other Clinician: Date of Birth/Sex: Female) Treating ROBSON, MICHAEL Primary Care Physician: Sherrie Mustache Physician/Extender: G Referring Physician: Sherrie Mustache Weeks in Treatment: 21 Active Problems Location of Pain Severity and Description of Pain Patient Has Paino No Site Locations With Dressing Change: No Pain Management and Medication Current Pain Management: Electronic Signature(s) Signed: 07/23/2015 2:19:20 PM By: Elpidio Eric BSN, RN Entered By: Elpidio Eric on 07/23/2015 12:54:22 [name], [name] ([id number]) -------------------------------------------------------------------------------- Patient/Caregiver Education Details Patient Name:  [name] Date of Service: 07/23/2015 12:45 PM Medical Record Patient Account Number: [id number] Number: Treating RN: Clover Mealy, RN, BSN, Rita 1946-01-05 [phone] y.o. Other Clinician: Date of Birth/Gender: Female) Treating ROBSON, MICHAEL Primary Care Physician: Sherrie Mustache Physician/Extender: G Referring Physician: Augustin Coupe in Treatment: 21 Education Assessment Education Provided To: Patient Education Topics Provided Electronic Signature(s) Signed: 07/23/2015 1:35:53 PM By: Elpidio Eric BSN, RN Entered By: Elpidio Eric on 07/23/2015 13:35:53 [name], [name] ([id number]) -------------------------------------------------------------------------------- Wound Assessment Details Patient Name: [name] Date of Service: 07/23/2015 12:45 PM Medical Record Patient Account Number: [id number] Number: Treating RN: Clover Mealy RN, BSN, Rita [birth year] [phone] y.o. Other Clinician: Date of Birth/Sex: Female) Treating ROBSON, MICHAEL Primary Care Physician: Sherrie Mustache Physician/Extender: G Referring Physician: Sherrie Mustache Weeks in Treatment: 21 Wound Status Wound Number: 6 Primary Diabetic Wound/Ulcer of the Lower Etiology: Extremity Wound Location: Left Lower Leg - Anterior Wound Open Wounding Event: Gradually Appeared Status: Date Acquired: 07/04/2015  Comorbid Hypertension, Peripheral Arterial Weeks Of Treatment: 1 History: Disease, Type II Diabetes, Clustered Wound: No Osteoarthritis, Neuropathy Photos Photo Uploaded By: Elpidio Eric on 07/23/2015 14:18:05 Wound Measurements Length: (cm) 0 % Reductio Width: (cm) 0 % Reductio Depth: (cm) 0 Epithelial Area: (cm) 0 Tunneling Volume: (cm) 0 Undermini n in Area: 100% n in Volume: 100% ization: Large (67-100%) : No ng: No Wound Description Classification: Grade 0 Wound Margin: Distinct, outline attached Exudate Amount: None Present Foul Odor After Cleansing: No Wound Bed Granulation Amount:  None Present (0%) Exposed Structure Necrotic Amount: None Present (0%) Fascia Exposed: No Fat Layer Exposed: No Tendon Exposed: No Macchia, Montgomery N. ([id number]) Muscle Exposed: No Joint Exposed: No Bone Exposed: No Limited to Skin Breakdown Periwound Skin Texture Texture Color No Abnormalities Noted: No No Abnormalities Noted: No Callus: No Atrophie Blanche: No Crepitus: No Cyanosis: No Excoriation: No Ecchymosis: No Fluctuance: No Erythema: No Friable: No Hemosiderin Staining: No Induration: No Mottled: No Localized Edema: Yes Pallor: No Rash: No Rubor: No Scarring: No Temperature / Pain Moisture Temperature: No Abnormality No Abnormalities Noted: No Dry / Scaly: Yes Maceration: No Moist: No Wound Preparation Ulcer Cleansing: Other: Surg soap and water, Topical Anesthetic Applied: None Electronic Signature(s) Signed: 07/23/2015 1:05:54 PM By: Elpidio Eric BSN, RN Entered By: Elpidio Eric on 07/23/2015 13:05:53 Mccollum, [name] ([id number]) -------------------------------------------------------------------------------- Vitals Details Patient Name: [name] Date of Service: 07/23/2015 12:45 PM Medical Record Patient Account Number: [id number] Number: Treating RN: Clover Mealy, RN, BSN, Rita 1945-05-11 [phone] y.o. Other Clinician: Date of Birth/Sex: Female) Treating ROBSON, MICHAEL Primary Care Physician: Sherrie Mustache Physician/Extender: G Referring Physician: Sherrie Mustache Weeks in Treatment: 21 Vital Signs Time Taken: 12:55 Temperature (F): 97.4 Height (in): 67 Pulse (bpm): 72 Weight (lbs): 302 Respiratory Rate (breaths/min): 17 Body Mass Index (BMI): 47.3 Blood Pressure (mmHg): 128/73 Reference Range: 80 - 120 mg / dl Electronic Signature(s) Signed: 07/23/2015 2:19:20 PM By: Elpidio Eric BSN, RN Entered By: Elpidio Eric on 07/23/2015 12:57:32

## 2015-07-24 NOTE — Progress Notes (Signed)
[name], [name] ([id number]) Visit Report for 07/23/2015 Chief Complaint Document Details Patient Name: [name], [name] Date of Service: 07/23/2015 12:45 PM Medical Record Patient Account Number: [id number] Number: Treating RN: Clover Mealy, RN, BSN, Rita 01/31/1946 [phone] y.o. Other Clinician: Date of Birth/Sex: Female) Treating ROBSON, MICHAEL Primary Care Physician/Extender: Kerry Kass, [facility] Physician: Referring Physician: Sherrie Mustache Weeks in Treatment: 21 Information Obtained from: Patient Chief Complaint Left calf ulcerations. Arterial insufficiency. Electronic Signature(s) Signed: 07/24/2015 7:56:34 AM By: Baltazar Najjar MD Entered By: Baltazar Najjar on 07/23/2015 14:28:01 [name], [name] ([id number]) -------------------------------------------------------------------------------- HPI Details Patient Name: [name] Date of Service: 07/23/2015 12:45 PM Medical Record Patient Account Number: [id number] Number: Treating RN: Clover Mealy RN, BSN, Rita Mar 22, 1945 [phone] y.o. Other Clinician: Date of Birth/Sex: Female) Treating ROBSON, MICHAEL Primary Care Physician/Extender: Kerry Kass, [facility] Physician: Referring Physician: Sherrie Mustache Weeks in Treatment: 21 History of Present Illness HPI Description: Pleasant 70 year old with diabetes (no hemoglobin A1c available) and severe peripheral vascular disease. She reports a prior left lower extremity stent. Records unavailable. Underwent multiple level angioplasty of left lower extremity 10/29/2014 by Dr. Wyn Quaker. Developed left calf ulcerations in late December 2016. Denies any trauma. Seen by her PCP. Bactrim prescribed. Referred to the wound clinic. Biopsy culture 02/20/2015 grew methicillin sensitive staph aureus, sensitive to Bactrim. Performing dressing changes with Prisma. Using a Tubigrip for edema control. Scheduled to see Dr. Wyn Quaker in follow-up 02/25/2015. Cancelled because of weather. Rescheduled for 03/06/2015. She  returns to clinic for follow-up and is without new complaints. Pain improved. No ischemic rest pain. No fever or chills. Minimal drainage. 03/13/2015 -- records obtained from [facility] shows that she was seen by Dr. dew on 03/12/2015 and given tramadol and other symptomatic treatment. Recent ABI done showed right to be 0.94 and left to be 0.47 a left lower extremity arterial duplex was notable for an occluded stent and distal ATA. Recommendations were for left lower extremity angiogram with intervention. on 03/11/2015 she was taken up for a aortogram with angioplasty of the left posterior tibial artery and tibioperoneal trunk, above-knee popliteal artery and almost entire SFA, stent placement to the SFA and popliteal artery. 03/20/15 her arterial interventions noted. The patient states her left leg feels better. He ischemic wound on her left anterior leg. She has been using Santyl 04/09/15; the patient arrives back today. 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 dew. I was concerned about her last week as she had weeping edema fluid and areas of threatened ulceration around her wound. I ordered a silver alginate, Profore light wrap. She comes back today with an Radio broadcast assistant on apparently applied by Dr. Driscilla Grammes office. Her edema is under much better control and the weeping area here last week surrounding her major wound has resolved. She does have another open area which is small and superficial. I'm not sure I would put an Unna boot on this lady's degree of arterial insufficiency. 04/16/15; her original small wound appears to be healthy with good granulation her measurements are improved. She has a small area underneath this. Both underwent selective debridement since surface slough 04/23/15; the original wound has filled in there is healthy granulation here. The small area underneath this is healed. No debridement was required 04/30/15;  the wound no longer has any depth. no Debridement required 05/07/15; the wound appears to be stable. Advancing epithelialization. Decrease wound volume. No Menzel, Monasia N. ([id number]) debridement is required. She has known arterial disease  status post stent placement to the SFA and popliteal artery. 05/14/15 unfortunately the wound is not really decreased much this week she still has a rim of epithelialization. The wound bed looks stable nevertheless I did a surgical debridement and there is some adherent surface slough. She has known PAD however the blood supply appears to be adequate status post stent to the SFA and popliteal and popliteal artery 05/21/15 wound is healthier than last week. No debridement is necessary. Known PAD status post stent to the SFA and popliteal artery 05/28/15; wound is no different from last week and in fact looks "stalled". Fairly we put Iodoflex on him last week however home health used Prisma 06/04/15 if anything the wound area appears to be larger. 06/11/15 switch to silver alginate last week and this seems to have helped. There is a thin layer of epithelialization only visible under the light 06/18/15; the patient had her leg traumatized when a grandchild fell over her leg at a party last weekend. Her original wound is just about closed very tiny however she has a new larger superficial wound just distal to the original wound. No evidence of infection 06/25/15; both of the wounds on her left leg including the chronic wound we have been treating her for a prolonged period of time now, and the traumatic wound from last week both are fully epithelialized. She apparently had a slip this week and managed to lift her nail off her right great toe. 07/02/15; patient's legs are carefully inspected. Her original wound on the left medial leg is completely healed. Traumatic area on her right great toenail from last week is also healed. She has combined venous and arterial  insufficiency we have obtained her juxtalite stockings bilaterally. 07/16/15 this is a patient we recently discharged. She states that the juxtalite stocking cause irritation and blistering on her anterior left leg. She is not therefore used them in the last 6 days 07/23/15; once again the patient has no open area here. Her edema is well controlled. She is very reluctant to try the juxtalite's again because she attributes this to the blistering on her left anterior leg. Both the medial original wound, and the blister on the anterior left leg give healed Electronic Signature(s) Signed: 07/24/2015 7:56:34 AM By: Baltazar Najjarobson, Michael MD Entered By: Baltazar Najjarobson, Michael on 07/23/2015 14:29:13 Cline, [name] [name] ([id number]) -------------------------------------------------------------------------------- Physical Exam Details Patient Name: [name] InfanteWOODS, [name] Date of Service: 07/23/2015 12:45 PM Medical Record Patient Account Number: [id number] Number: Treating RN: Clover MealyAfful, RN, BSN, Rita 1945/04/10 [phone](70 y.o. Other Clinician: Date of Birth/Sex: Female) Treating ROBSON, MICHAEL Primary Care Physician/Extender: Kerry KassG JADALI, [facility] Physician: Referring Physician: Sherrie MustacheJADALI, FAYEGH Weeks in Treatment: 21 Notes Wound exam; her edema in the left leg is well controlled and the area appears to be well perfused. The wound on her medial aspect of her left leg is still closed, these superficial left anterior excoriations from last week are also closed Electronic Signature(s) Signed: 07/24/2015 7:56:34 AM By: Baltazar Najjarobson, Michael MD Entered By: Baltazar Najjarobson, Michael on 07/23/2015 14:34:09 [name], [name] [name] ([id number]) -------------------------------------------------------------------------------- Physician Orders Details Patient Name: [name] InfanteWOODS, [name] Date of Service: 07/23/2015 12:45 PM Medical Record Patient Account Number: [id number] Number: Treating RN: Clover MealyAfful, RN, BSN, Rita 1945/04/10 [phone](70 y.o. Other Clinician: Date of  Birth/Sex: Female) Treating ROBSON, MICHAEL Primary Care Physician/Extender: Kerry KassG JADALI, [facility] Physician: Referring Physician: Sherrie MustacheJADALI, FAYEGH Weeks in Treatment: 2921 Verbal / Phone Orders: Yes Clinician: Afful, RN, BSN, Rita Read Back and Verified: Yes Diagnosis Coding Edema Control   o Patient to wear own compression stockings o Elevate legs to the level of the heart and pump ankles as often as possible Discharge From [facility] Services o Discharge from Wound Care Center - Treatment Completed Electronic Signature(s) Signed: 07/23/2015 2:19:20 PM By: Elpidio Eric BSN, RN Signed: 07/24/2015 7:56:34 AM By: Baltazar Najjar MD Entered By: Elpidio Eric on 07/23/2015 13:18:02 [name], [name] ([id number]) -------------------------------------------------------------------------------- Problem List Details Patient Name: [name] Date of Service: 07/23/2015 12:45 PM Medical Record Patient Account Number: [id number] Number: Treating RN: Clover Mealy, RN, BSN, Rita May 22, 1945 [phone] y.o. 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 02/21/2015 Yes the extremities with rest pain, left leg 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 Other Clinician: Date of Birth/Sex: Female) Treating ROBSON, MICHAEL Primary Care Physician/Extender: Kerry Kass, [facility] Physician: Referring Physician: Sherrie Mustache Weeks in Treatment: 21 Subjective Chief Complaint Information obtained from Patient Left calf ulcerations. Arterial insufficiency. History of Present Illness (HPI) Pleasant 70 year old with diabetes (no hemoglobin A1c available) and severe peripheral vascular disease. She reports a prior left lower extremity stent. Records unavailable. Underwent multiple level angioplasty of left lower extremity 10/29/2014 by Dr. Wyn Quaker. Developed left calf ulcerations in late December 2016. Denies any trauma. Seen by her PCP. Bactrim prescribed. Referred to the wound clinic. Biopsy culture 02/20/2015 grew methicillin sensitive staph aureus, sensitive to Bactrim. Performing dressing changes with Prisma. Using a Tubigrip for edema control. Scheduled to see Dr. Wyn Quaker in follow-up 02/25/2015. Cancelled because of weather. Rescheduled for 03/06/2015. She returns to clinic for follow-up and is without new complaints. Pain improved. No ischemic rest pain. No fever or chills. Minimal drainage. 03/13/2015 -- records obtained from [facility] shows that she was seen by Dr. dew on 03/12/2015 and given tramadol and other symptomatic treatment. Recent ABI done showed right to be 0.94 and left to be 0.47 a left lower extremity arterial duplex was notable for an occluded stent and distal ATA. Recommendations were for left lower extremity angiogram with intervention. on 03/11/2015 she was taken up for a aortogram with angioplasty of the left  posterior tibial artery and tibioperoneal trunk, above-knee popliteal artery and almost entire SFA, stent placement to the SFA and popliteal artery. 03/20/15 her arterial interventions noted. The patient states her left leg feels better. He ischemic wound on her left anterior leg. She has been using Santyl 04/09/15; the patient arrives back today. 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 dew. I was concerned about her last week as she had weeping edema fluid and areas of threatened ulceration around her wound. I ordered a silver alginate, Profore light wrap. She comes back today with an Radio broadcast assistant  on apparently applied by Dr. Driscilla Grammes office. Her edema is under much better control and the weeping area here last week surrounding her major wound has resolved. She does have another open area which is small and superficial. I'm not sure I would put an Unna boot on this lady's degree of arterial insufficiency. 04/16/15; her original small wound appears to be healthy with good granulation her measurements are improved. She has a small area underneath this. Both underwent selective debridement since surface [name], [name] ([id number]) slough 04/23/15; the original wound has filled in there is healthy granulation here. The small area underneath this is healed. No debridement was required 04/30/15; the wound no longer has any depth. no Debridement required 05/07/15; the wound appears to be stable. Advancing epithelialization. Decrease wound volume. No debridement is required. She has known arterial disease status post stent placement to the SFA and popliteal artery. 05/14/15 unfortunately the wound is not really decreased much this week she still has a rim of epithelialization. The wound bed looks stable nevertheless I did a surgical debridement and there is some adherent surface slough. She has known PAD however the blood supply appears to be adequate  status post stent to the SFA and popliteal and popliteal artery 05/21/15 wound is healthier than last week. No debridement is necessary. Known PAD status post stent to the SFA and popliteal artery 05/28/15; wound is no different from last week and in fact looks "stalled". Fairly we put Iodoflex on him last week however home health used Prisma 06/04/15 if anything the wound area appears to be larger. 06/11/15 switch to silver alginate last week and this seems to have helped. There is a thin layer of epithelialization only visible under the light 06/18/15; the patient had her leg traumatized when a grandchild fell over her leg at a party last weekend. Her original wound is just about closed very tiny however she has a new larger superficial wound just distal to the original wound. No evidence of infection 06/25/15; both of the wounds on her left leg including the chronic wound we have been treating her for a prolonged period of time now, and the traumatic wound from last week both are fully epithelialized. She apparently had a slip this week and managed to lift her nail off her right great toe. 07/02/15; patient's legs are carefully inspected. Her original wound on the left medial leg is completely healed. Traumatic area on her right great toenail from last week is also healed. She has combined venous and arterial insufficiency we have obtained her juxtalite stockings bilaterally. 07/16/15 this is a patient we recently discharged. She states that the juxtalite stocking cause irritation and blistering on her anterior left leg. She is not therefore used them in the last 6 days 07/23/15; once again the patient has no open area here. Her edema is well controlled. She is very reluctant to try the juxtalite's again because she attributes this to the blistering on her left anterior leg. Both the medial original wound, and the blister on the anterior left leg give healed Objective Constitutional Vitals Time Taken:  12:55 PM, Height: 67 in, Weight: 302 lbs, BMI: 47.3, Temperature: 97.4 F, Pulse: 72 bpm, Respiratory Rate: 17 breaths/min, Blood Pressure: 128/73 mmHg. Integumentary (Hair, Skin) Wound #6 status is Open. Original cause of wound was Gradually Appeared. The wound is located on the Left,Anterior Lower Leg. The wound measures 0cm length x 0cm width x 0cm depth; 0cm^2 area and 0cm^3 volume. The wound is limited to  skin breakdown. There is no tunneling or undermining noted. There [name], [name] ([id number]) is a none present amount of drainage noted. The wound margin is distinct with the outline attached to the wound base. There is no granulation within the wound bed. There is no necrotic tissue within the wound bed. The periwound skin appearance exhibited: Localized Edema, Dry/Scaly. The periwound skin appearance did not exhibit: Callus, Crepitus, Excoriation, Fluctuance, Friable, Induration, Rash, Scarring, Maceration, Moist, Atrophie Blanche, Cyanosis, Ecchymosis, Hemosiderin Staining, Mottled, Pallor, Rubor, Erythema. Periwound temperature was noted as No Abnormality. 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 [place] scripts for 20-30 mm below-knee stockings. We have given her the address for the company and [place]  that has them for a reasonable price. She seemed very reluctant to go ahead with the juxtalite stockings related to the blistering last week Electronic Signature(s) Signed: 07/24/2015 7:56:34 AM By: Baltazar Najjar MD Entered By: Baltazar Najjar on 07/23/2015 14:30:51 [name], [name] KARIS, RILLING ([id number]) -------------------------------------------------------------------------------- SuperBill Details Patient Name: [name] Date of Service: 07/23/2015 Medical Record Patient Account Number: [id number] Number: Treating RN: Clover Mealy RN, BSN, Rita 12/30/1945 [phone] y.o. 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

## 2015-10-02 DIAGNOSIS — E1165 Type 2 diabetes mellitus with hyperglycemia: Secondary | ICD-10-CM | POA: Diagnosis not present

## 2015-10-02 DIAGNOSIS — E781 Pure hyperglyceridemia: Secondary | ICD-10-CM | POA: Diagnosis not present

## 2015-10-02 DIAGNOSIS — I10 Essential (primary) hypertension: Secondary | ICD-10-CM | POA: Diagnosis not present

## 2015-10-04 DIAGNOSIS — M1991 Primary osteoarthritis, unspecified site: Secondary | ICD-10-CM | POA: Diagnosis not present

## 2015-10-04 DIAGNOSIS — I129 Hypertensive chronic kidney disease with stage 1 through stage 4 chronic kidney disease, or unspecified chronic kidney disease: Secondary | ICD-10-CM | POA: Diagnosis not present

## 2015-10-04 DIAGNOSIS — D509 Iron deficiency anemia, unspecified: Secondary | ICD-10-CM | POA: Diagnosis not present

## 2015-10-04 DIAGNOSIS — I872 Venous insufficiency (chronic) (peripheral): Secondary | ICD-10-CM | POA: Diagnosis not present

## 2015-10-04 DIAGNOSIS — I10 Essential (primary) hypertension: Secondary | ICD-10-CM | POA: Diagnosis not present

## 2015-10-04 DIAGNOSIS — E785 Hyperlipidemia, unspecified: Secondary | ICD-10-CM | POA: Diagnosis not present

## 2015-10-04 DIAGNOSIS — E1151 Type 2 diabetes mellitus with diabetic peripheral angiopathy without gangrene: Secondary | ICD-10-CM | POA: Diagnosis not present

## 2015-10-04 DIAGNOSIS — E1122 Type 2 diabetes mellitus with diabetic chronic kidney disease: Secondary | ICD-10-CM | POA: Diagnosis not present

## 2015-10-04 DIAGNOSIS — D649 Anemia, unspecified: Secondary | ICD-10-CM | POA: Diagnosis not present

## 2015-10-04 DIAGNOSIS — N189 Chronic kidney disease, unspecified: Secondary | ICD-10-CM | POA: Diagnosis not present

## 2015-10-04 DIAGNOSIS — E1140 Type 2 diabetes mellitus with diabetic neuropathy, unspecified: Secondary | ICD-10-CM | POA: Diagnosis not present

## 2015-10-04 DIAGNOSIS — E559 Vitamin D deficiency, unspecified: Secondary | ICD-10-CM | POA: Diagnosis not present

## 2015-10-04 DIAGNOSIS — K219 Gastro-esophageal reflux disease without esophagitis: Secondary | ICD-10-CM | POA: Diagnosis not present

## 2015-10-04 DIAGNOSIS — L97821 Non-pressure chronic ulcer of other part of left lower leg limited to breakdown of skin: Secondary | ICD-10-CM | POA: Diagnosis not present

## 2015-10-04 DIAGNOSIS — E119 Type 2 diabetes mellitus without complications: Secondary | ICD-10-CM | POA: Diagnosis not present

## 2015-10-07 DIAGNOSIS — E785 Hyperlipidemia, unspecified: Secondary | ICD-10-CM | POA: Diagnosis not present

## 2015-10-07 DIAGNOSIS — K219 Gastro-esophageal reflux disease without esophagitis: Secondary | ICD-10-CM | POA: Diagnosis not present

## 2015-10-07 DIAGNOSIS — I872 Venous insufficiency (chronic) (peripheral): Secondary | ICD-10-CM | POA: Diagnosis not present

## 2015-10-07 DIAGNOSIS — N189 Chronic kidney disease, unspecified: Secondary | ICD-10-CM | POA: Diagnosis not present

## 2015-10-07 DIAGNOSIS — E1122 Type 2 diabetes mellitus with diabetic chronic kidney disease: Secondary | ICD-10-CM | POA: Diagnosis not present

## 2015-10-07 DIAGNOSIS — L97821 Non-pressure chronic ulcer of other part of left lower leg limited to breakdown of skin: Secondary | ICD-10-CM | POA: Diagnosis not present

## 2015-10-07 DIAGNOSIS — E559 Vitamin D deficiency, unspecified: Secondary | ICD-10-CM | POA: Diagnosis not present

## 2015-10-07 DIAGNOSIS — D509 Iron deficiency anemia, unspecified: Secondary | ICD-10-CM | POA: Diagnosis not present

## 2015-10-07 DIAGNOSIS — E1140 Type 2 diabetes mellitus with diabetic neuropathy, unspecified: Secondary | ICD-10-CM | POA: Diagnosis not present

## 2015-10-07 DIAGNOSIS — M1991 Primary osteoarthritis, unspecified site: Secondary | ICD-10-CM | POA: Diagnosis not present

## 2015-10-07 DIAGNOSIS — I129 Hypertensive chronic kidney disease with stage 1 through stage 4 chronic kidney disease, or unspecified chronic kidney disease: Secondary | ICD-10-CM | POA: Diagnosis not present

## 2015-10-07 DIAGNOSIS — E1151 Type 2 diabetes mellitus with diabetic peripheral angiopathy without gangrene: Secondary | ICD-10-CM | POA: Diagnosis not present

## 2015-10-10 DIAGNOSIS — K219 Gastro-esophageal reflux disease without esophagitis: Secondary | ICD-10-CM | POA: Diagnosis not present

## 2015-10-10 DIAGNOSIS — E1151 Type 2 diabetes mellitus with diabetic peripheral angiopathy without gangrene: Secondary | ICD-10-CM | POA: Diagnosis not present

## 2015-10-10 DIAGNOSIS — M1991 Primary osteoarthritis, unspecified site: Secondary | ICD-10-CM | POA: Diagnosis not present

## 2015-10-10 DIAGNOSIS — E785 Hyperlipidemia, unspecified: Secondary | ICD-10-CM | POA: Diagnosis not present

## 2015-10-10 DIAGNOSIS — E1140 Type 2 diabetes mellitus with diabetic neuropathy, unspecified: Secondary | ICD-10-CM | POA: Diagnosis not present

## 2015-10-10 DIAGNOSIS — E559 Vitamin D deficiency, unspecified: Secondary | ICD-10-CM | POA: Diagnosis not present

## 2015-10-10 DIAGNOSIS — I872 Venous insufficiency (chronic) (peripheral): Secondary | ICD-10-CM | POA: Diagnosis not present

## 2015-10-10 DIAGNOSIS — L97821 Non-pressure chronic ulcer of other part of left lower leg limited to breakdown of skin: Secondary | ICD-10-CM | POA: Diagnosis not present

## 2015-10-10 DIAGNOSIS — N189 Chronic kidney disease, unspecified: Secondary | ICD-10-CM | POA: Diagnosis not present

## 2015-10-10 DIAGNOSIS — E1122 Type 2 diabetes mellitus with diabetic chronic kidney disease: Secondary | ICD-10-CM | POA: Diagnosis not present

## 2015-10-10 DIAGNOSIS — I129 Hypertensive chronic kidney disease with stage 1 through stage 4 chronic kidney disease, or unspecified chronic kidney disease: Secondary | ICD-10-CM | POA: Diagnosis not present

## 2015-10-10 DIAGNOSIS — D509 Iron deficiency anemia, unspecified: Secondary | ICD-10-CM | POA: Diagnosis not present

## 2015-10-14 DIAGNOSIS — E1140 Type 2 diabetes mellitus with diabetic neuropathy, unspecified: Secondary | ICD-10-CM | POA: Diagnosis not present

## 2015-10-14 DIAGNOSIS — N189 Chronic kidney disease, unspecified: Secondary | ICD-10-CM | POA: Diagnosis not present

## 2015-10-14 DIAGNOSIS — E785 Hyperlipidemia, unspecified: Secondary | ICD-10-CM | POA: Diagnosis not present

## 2015-10-14 DIAGNOSIS — D509 Iron deficiency anemia, unspecified: Secondary | ICD-10-CM | POA: Diagnosis not present

## 2015-10-14 DIAGNOSIS — M1991 Primary osteoarthritis, unspecified site: Secondary | ICD-10-CM | POA: Diagnosis not present

## 2015-10-14 DIAGNOSIS — L97821 Non-pressure chronic ulcer of other part of left lower leg limited to breakdown of skin: Secondary | ICD-10-CM | POA: Diagnosis not present

## 2015-10-14 DIAGNOSIS — I129 Hypertensive chronic kidney disease with stage 1 through stage 4 chronic kidney disease, or unspecified chronic kidney disease: Secondary | ICD-10-CM | POA: Diagnosis not present

## 2015-10-14 DIAGNOSIS — E559 Vitamin D deficiency, unspecified: Secondary | ICD-10-CM | POA: Diagnosis not present

## 2015-10-14 DIAGNOSIS — I872 Venous insufficiency (chronic) (peripheral): Secondary | ICD-10-CM | POA: Diagnosis not present

## 2015-10-14 DIAGNOSIS — K219 Gastro-esophageal reflux disease without esophagitis: Secondary | ICD-10-CM | POA: Diagnosis not present

## 2015-10-14 DIAGNOSIS — E1122 Type 2 diabetes mellitus with diabetic chronic kidney disease: Secondary | ICD-10-CM | POA: Diagnosis not present

## 2015-10-14 DIAGNOSIS — E1151 Type 2 diabetes mellitus with diabetic peripheral angiopathy without gangrene: Secondary | ICD-10-CM | POA: Diagnosis not present

## 2015-10-17 DIAGNOSIS — E785 Hyperlipidemia, unspecified: Secondary | ICD-10-CM | POA: Diagnosis not present

## 2015-10-17 DIAGNOSIS — I872 Venous insufficiency (chronic) (peripheral): Secondary | ICD-10-CM | POA: Diagnosis not present

## 2015-10-17 DIAGNOSIS — E1122 Type 2 diabetes mellitus with diabetic chronic kidney disease: Secondary | ICD-10-CM | POA: Diagnosis not present

## 2015-10-17 DIAGNOSIS — E1140 Type 2 diabetes mellitus with diabetic neuropathy, unspecified: Secondary | ICD-10-CM | POA: Diagnosis not present

## 2015-10-17 DIAGNOSIS — D509 Iron deficiency anemia, unspecified: Secondary | ICD-10-CM | POA: Diagnosis not present

## 2015-10-17 DIAGNOSIS — I129 Hypertensive chronic kidney disease with stage 1 through stage 4 chronic kidney disease, or unspecified chronic kidney disease: Secondary | ICD-10-CM | POA: Diagnosis not present

## 2015-10-17 DIAGNOSIS — E559 Vitamin D deficiency, unspecified: Secondary | ICD-10-CM | POA: Diagnosis not present

## 2015-10-17 DIAGNOSIS — E1151 Type 2 diabetes mellitus with diabetic peripheral angiopathy without gangrene: Secondary | ICD-10-CM | POA: Diagnosis not present

## 2015-10-17 DIAGNOSIS — L97821 Non-pressure chronic ulcer of other part of left lower leg limited to breakdown of skin: Secondary | ICD-10-CM | POA: Diagnosis not present

## 2015-10-17 DIAGNOSIS — N189 Chronic kidney disease, unspecified: Secondary | ICD-10-CM | POA: Diagnosis not present

## 2015-10-17 DIAGNOSIS — K219 Gastro-esophageal reflux disease without esophagitis: Secondary | ICD-10-CM | POA: Diagnosis not present

## 2015-10-17 DIAGNOSIS — M1991 Primary osteoarthritis, unspecified site: Secondary | ICD-10-CM | POA: Diagnosis not present

## 2015-10-21 DIAGNOSIS — E785 Hyperlipidemia, unspecified: Secondary | ICD-10-CM | POA: Diagnosis not present

## 2015-10-21 DIAGNOSIS — E1122 Type 2 diabetes mellitus with diabetic chronic kidney disease: Secondary | ICD-10-CM | POA: Diagnosis not present

## 2015-10-21 DIAGNOSIS — E559 Vitamin D deficiency, unspecified: Secondary | ICD-10-CM | POA: Diagnosis not present

## 2015-10-21 DIAGNOSIS — M1991 Primary osteoarthritis, unspecified site: Secondary | ICD-10-CM | POA: Diagnosis not present

## 2015-10-21 DIAGNOSIS — K219 Gastro-esophageal reflux disease without esophagitis: Secondary | ICD-10-CM | POA: Diagnosis not present

## 2015-10-21 DIAGNOSIS — L97821 Non-pressure chronic ulcer of other part of left lower leg limited to breakdown of skin: Secondary | ICD-10-CM | POA: Diagnosis not present

## 2015-10-21 DIAGNOSIS — I129 Hypertensive chronic kidney disease with stage 1 through stage 4 chronic kidney disease, or unspecified chronic kidney disease: Secondary | ICD-10-CM | POA: Diagnosis not present

## 2015-10-21 DIAGNOSIS — E1140 Type 2 diabetes mellitus with diabetic neuropathy, unspecified: Secondary | ICD-10-CM | POA: Diagnosis not present

## 2015-10-21 DIAGNOSIS — N189 Chronic kidney disease, unspecified: Secondary | ICD-10-CM | POA: Diagnosis not present

## 2015-10-21 DIAGNOSIS — E1151 Type 2 diabetes mellitus with diabetic peripheral angiopathy without gangrene: Secondary | ICD-10-CM | POA: Diagnosis not present

## 2015-10-21 DIAGNOSIS — D509 Iron deficiency anemia, unspecified: Secondary | ICD-10-CM | POA: Diagnosis not present

## 2015-10-21 DIAGNOSIS — I872 Venous insufficiency (chronic) (peripheral): Secondary | ICD-10-CM | POA: Diagnosis not present

## 2015-10-24 DIAGNOSIS — D509 Iron deficiency anemia, unspecified: Secondary | ICD-10-CM | POA: Diagnosis not present

## 2015-10-24 DIAGNOSIS — I872 Venous insufficiency (chronic) (peripheral): Secondary | ICD-10-CM | POA: Diagnosis not present

## 2015-10-24 DIAGNOSIS — M1991 Primary osteoarthritis, unspecified site: Secondary | ICD-10-CM | POA: Diagnosis not present

## 2015-10-24 DIAGNOSIS — E1151 Type 2 diabetes mellitus with diabetic peripheral angiopathy without gangrene: Secondary | ICD-10-CM | POA: Diagnosis not present

## 2015-10-24 DIAGNOSIS — K219 Gastro-esophageal reflux disease without esophagitis: Secondary | ICD-10-CM | POA: Diagnosis not present

## 2015-10-24 DIAGNOSIS — N189 Chronic kidney disease, unspecified: Secondary | ICD-10-CM | POA: Diagnosis not present

## 2015-10-24 DIAGNOSIS — I129 Hypertensive chronic kidney disease with stage 1 through stage 4 chronic kidney disease, or unspecified chronic kidney disease: Secondary | ICD-10-CM | POA: Diagnosis not present

## 2015-10-24 DIAGNOSIS — E1140 Type 2 diabetes mellitus with diabetic neuropathy, unspecified: Secondary | ICD-10-CM | POA: Diagnosis not present

## 2015-10-24 DIAGNOSIS — E1122 Type 2 diabetes mellitus with diabetic chronic kidney disease: Secondary | ICD-10-CM | POA: Diagnosis not present

## 2015-10-24 DIAGNOSIS — E785 Hyperlipidemia, unspecified: Secondary | ICD-10-CM | POA: Diagnosis not present

## 2015-10-24 DIAGNOSIS — L97821 Non-pressure chronic ulcer of other part of left lower leg limited to breakdown of skin: Secondary | ICD-10-CM | POA: Diagnosis not present

## 2015-10-24 DIAGNOSIS — E559 Vitamin D deficiency, unspecified: Secondary | ICD-10-CM | POA: Diagnosis not present

## 2015-10-28 DIAGNOSIS — E1140 Type 2 diabetes mellitus with diabetic neuropathy, unspecified: Secondary | ICD-10-CM | POA: Diagnosis not present

## 2015-10-28 DIAGNOSIS — E1122 Type 2 diabetes mellitus with diabetic chronic kidney disease: Secondary | ICD-10-CM | POA: Diagnosis not present

## 2015-10-28 DIAGNOSIS — I129 Hypertensive chronic kidney disease with stage 1 through stage 4 chronic kidney disease, or unspecified chronic kidney disease: Secondary | ICD-10-CM | POA: Diagnosis not present

## 2015-10-28 DIAGNOSIS — E559 Vitamin D deficiency, unspecified: Secondary | ICD-10-CM | POA: Diagnosis not present

## 2015-10-28 DIAGNOSIS — I872 Venous insufficiency (chronic) (peripheral): Secondary | ICD-10-CM | POA: Diagnosis not present

## 2015-10-28 DIAGNOSIS — N189 Chronic kidney disease, unspecified: Secondary | ICD-10-CM | POA: Diagnosis not present

## 2015-10-28 DIAGNOSIS — E785 Hyperlipidemia, unspecified: Secondary | ICD-10-CM | POA: Diagnosis not present

## 2015-10-28 DIAGNOSIS — L97821 Non-pressure chronic ulcer of other part of left lower leg limited to breakdown of skin: Secondary | ICD-10-CM | POA: Diagnosis not present

## 2015-10-28 DIAGNOSIS — M1991 Primary osteoarthritis, unspecified site: Secondary | ICD-10-CM | POA: Diagnosis not present

## 2015-10-28 DIAGNOSIS — D509 Iron deficiency anemia, unspecified: Secondary | ICD-10-CM | POA: Diagnosis not present

## 2015-10-28 DIAGNOSIS — E1151 Type 2 diabetes mellitus with diabetic peripheral angiopathy without gangrene: Secondary | ICD-10-CM | POA: Diagnosis not present

## 2015-10-28 DIAGNOSIS — K219 Gastro-esophageal reflux disease without esophagitis: Secondary | ICD-10-CM | POA: Diagnosis not present

## 2015-10-31 DIAGNOSIS — E559 Vitamin D deficiency, unspecified: Secondary | ICD-10-CM | POA: Diagnosis not present

## 2015-10-31 DIAGNOSIS — I872 Venous insufficiency (chronic) (peripheral): Secondary | ICD-10-CM | POA: Diagnosis not present

## 2015-10-31 DIAGNOSIS — E785 Hyperlipidemia, unspecified: Secondary | ICD-10-CM | POA: Diagnosis not present

## 2015-10-31 DIAGNOSIS — K219 Gastro-esophageal reflux disease without esophagitis: Secondary | ICD-10-CM | POA: Diagnosis not present

## 2015-10-31 DIAGNOSIS — I129 Hypertensive chronic kidney disease with stage 1 through stage 4 chronic kidney disease, or unspecified chronic kidney disease: Secondary | ICD-10-CM | POA: Diagnosis not present

## 2015-10-31 DIAGNOSIS — E1122 Type 2 diabetes mellitus with diabetic chronic kidney disease: Secondary | ICD-10-CM | POA: Diagnosis not present

## 2015-10-31 DIAGNOSIS — E1151 Type 2 diabetes mellitus with diabetic peripheral angiopathy without gangrene: Secondary | ICD-10-CM | POA: Diagnosis not present

## 2015-10-31 DIAGNOSIS — M1991 Primary osteoarthritis, unspecified site: Secondary | ICD-10-CM | POA: Diagnosis not present

## 2015-10-31 DIAGNOSIS — D509 Iron deficiency anemia, unspecified: Secondary | ICD-10-CM | POA: Diagnosis not present

## 2015-10-31 DIAGNOSIS — N189 Chronic kidney disease, unspecified: Secondary | ICD-10-CM | POA: Diagnosis not present

## 2015-10-31 DIAGNOSIS — L97821 Non-pressure chronic ulcer of other part of left lower leg limited to breakdown of skin: Secondary | ICD-10-CM | POA: Diagnosis not present

## 2015-10-31 DIAGNOSIS — E1140 Type 2 diabetes mellitus with diabetic neuropathy, unspecified: Secondary | ICD-10-CM | POA: Diagnosis not present

## 2015-11-04 DIAGNOSIS — N189 Chronic kidney disease, unspecified: Secondary | ICD-10-CM | POA: Diagnosis not present

## 2015-11-04 DIAGNOSIS — E1122 Type 2 diabetes mellitus with diabetic chronic kidney disease: Secondary | ICD-10-CM | POA: Diagnosis not present

## 2015-11-04 DIAGNOSIS — E1151 Type 2 diabetes mellitus with diabetic peripheral angiopathy without gangrene: Secondary | ICD-10-CM | POA: Diagnosis not present

## 2015-11-04 DIAGNOSIS — K219 Gastro-esophageal reflux disease without esophagitis: Secondary | ICD-10-CM | POA: Diagnosis not present

## 2015-11-04 DIAGNOSIS — I872 Venous insufficiency (chronic) (peripheral): Secondary | ICD-10-CM | POA: Diagnosis not present

## 2015-11-04 DIAGNOSIS — M1991 Primary osteoarthritis, unspecified site: Secondary | ICD-10-CM | POA: Diagnosis not present

## 2015-11-04 DIAGNOSIS — D509 Iron deficiency anemia, unspecified: Secondary | ICD-10-CM | POA: Diagnosis not present

## 2015-11-04 DIAGNOSIS — L97821 Non-pressure chronic ulcer of other part of left lower leg limited to breakdown of skin: Secondary | ICD-10-CM | POA: Diagnosis not present

## 2015-11-04 DIAGNOSIS — E1140 Type 2 diabetes mellitus with diabetic neuropathy, unspecified: Secondary | ICD-10-CM | POA: Diagnosis not present

## 2015-11-04 DIAGNOSIS — I129 Hypertensive chronic kidney disease with stage 1 through stage 4 chronic kidney disease, or unspecified chronic kidney disease: Secondary | ICD-10-CM | POA: Diagnosis not present

## 2015-11-04 DIAGNOSIS — E785 Hyperlipidemia, unspecified: Secondary | ICD-10-CM | POA: Diagnosis not present

## 2015-11-04 DIAGNOSIS — E559 Vitamin D deficiency, unspecified: Secondary | ICD-10-CM | POA: Diagnosis not present

## 2015-11-07 DIAGNOSIS — E785 Hyperlipidemia, unspecified: Secondary | ICD-10-CM | POA: Diagnosis not present

## 2015-11-07 DIAGNOSIS — L97821 Non-pressure chronic ulcer of other part of left lower leg limited to breakdown of skin: Secondary | ICD-10-CM | POA: Diagnosis not present

## 2015-11-07 DIAGNOSIS — E559 Vitamin D deficiency, unspecified: Secondary | ICD-10-CM | POA: Diagnosis not present

## 2015-11-07 DIAGNOSIS — I129 Hypertensive chronic kidney disease with stage 1 through stage 4 chronic kidney disease, or unspecified chronic kidney disease: Secondary | ICD-10-CM | POA: Diagnosis not present

## 2015-11-07 DIAGNOSIS — K219 Gastro-esophageal reflux disease without esophagitis: Secondary | ICD-10-CM | POA: Diagnosis not present

## 2015-11-07 DIAGNOSIS — I872 Venous insufficiency (chronic) (peripheral): Secondary | ICD-10-CM | POA: Diagnosis not present

## 2015-11-07 DIAGNOSIS — D509 Iron deficiency anemia, unspecified: Secondary | ICD-10-CM | POA: Diagnosis not present

## 2015-11-07 DIAGNOSIS — E1151 Type 2 diabetes mellitus with diabetic peripheral angiopathy without gangrene: Secondary | ICD-10-CM | POA: Diagnosis not present

## 2015-11-07 DIAGNOSIS — E1140 Type 2 diabetes mellitus with diabetic neuropathy, unspecified: Secondary | ICD-10-CM | POA: Diagnosis not present

## 2015-11-07 DIAGNOSIS — M1991 Primary osteoarthritis, unspecified site: Secondary | ICD-10-CM | POA: Diagnosis not present

## 2015-11-07 DIAGNOSIS — N189 Chronic kidney disease, unspecified: Secondary | ICD-10-CM | POA: Diagnosis not present

## 2015-11-07 DIAGNOSIS — E1122 Type 2 diabetes mellitus with diabetic chronic kidney disease: Secondary | ICD-10-CM | POA: Diagnosis not present

## 2015-11-11 DIAGNOSIS — E1122 Type 2 diabetes mellitus with diabetic chronic kidney disease: Secondary | ICD-10-CM | POA: Diagnosis not present

## 2015-11-11 DIAGNOSIS — E1151 Type 2 diabetes mellitus with diabetic peripheral angiopathy without gangrene: Secondary | ICD-10-CM | POA: Diagnosis not present

## 2015-11-11 DIAGNOSIS — I129 Hypertensive chronic kidney disease with stage 1 through stage 4 chronic kidney disease, or unspecified chronic kidney disease: Secondary | ICD-10-CM | POA: Diagnosis not present

## 2015-11-11 DIAGNOSIS — K219 Gastro-esophageal reflux disease without esophagitis: Secondary | ICD-10-CM | POA: Diagnosis not present

## 2015-11-11 DIAGNOSIS — D509 Iron deficiency anemia, unspecified: Secondary | ICD-10-CM | POA: Diagnosis not present

## 2015-11-11 DIAGNOSIS — N189 Chronic kidney disease, unspecified: Secondary | ICD-10-CM | POA: Diagnosis not present

## 2015-11-11 DIAGNOSIS — I872 Venous insufficiency (chronic) (peripheral): Secondary | ICD-10-CM | POA: Diagnosis not present

## 2015-11-11 DIAGNOSIS — E559 Vitamin D deficiency, unspecified: Secondary | ICD-10-CM | POA: Diagnosis not present

## 2015-11-11 DIAGNOSIS — M1991 Primary osteoarthritis, unspecified site: Secondary | ICD-10-CM | POA: Diagnosis not present

## 2015-11-11 DIAGNOSIS — E785 Hyperlipidemia, unspecified: Secondary | ICD-10-CM | POA: Diagnosis not present

## 2015-11-11 DIAGNOSIS — E1140 Type 2 diabetes mellitus with diabetic neuropathy, unspecified: Secondary | ICD-10-CM | POA: Diagnosis not present

## 2015-11-11 DIAGNOSIS — L97821 Non-pressure chronic ulcer of other part of left lower leg limited to breakdown of skin: Secondary | ICD-10-CM | POA: Diagnosis not present

## 2015-11-14 DIAGNOSIS — E1151 Type 2 diabetes mellitus with diabetic peripheral angiopathy without gangrene: Secondary | ICD-10-CM | POA: Diagnosis not present

## 2015-11-14 DIAGNOSIS — I872 Venous insufficiency (chronic) (peripheral): Secondary | ICD-10-CM | POA: Diagnosis not present

## 2015-11-14 DIAGNOSIS — M1991 Primary osteoarthritis, unspecified site: Secondary | ICD-10-CM | POA: Diagnosis not present

## 2015-11-14 DIAGNOSIS — N189 Chronic kidney disease, unspecified: Secondary | ICD-10-CM | POA: Diagnosis not present

## 2015-11-14 DIAGNOSIS — E785 Hyperlipidemia, unspecified: Secondary | ICD-10-CM | POA: Diagnosis not present

## 2015-11-14 DIAGNOSIS — K219 Gastro-esophageal reflux disease without esophagitis: Secondary | ICD-10-CM | POA: Diagnosis not present

## 2015-11-14 DIAGNOSIS — D509 Iron deficiency anemia, unspecified: Secondary | ICD-10-CM | POA: Diagnosis not present

## 2015-11-14 DIAGNOSIS — E1122 Type 2 diabetes mellitus with diabetic chronic kidney disease: Secondary | ICD-10-CM | POA: Diagnosis not present

## 2015-11-14 DIAGNOSIS — E1140 Type 2 diabetes mellitus with diabetic neuropathy, unspecified: Secondary | ICD-10-CM | POA: Diagnosis not present

## 2015-11-14 DIAGNOSIS — E559 Vitamin D deficiency, unspecified: Secondary | ICD-10-CM | POA: Diagnosis not present

## 2015-11-14 DIAGNOSIS — L97821 Non-pressure chronic ulcer of other part of left lower leg limited to breakdown of skin: Secondary | ICD-10-CM | POA: Diagnosis not present

## 2015-11-14 DIAGNOSIS — I129 Hypertensive chronic kidney disease with stage 1 through stage 4 chronic kidney disease, or unspecified chronic kidney disease: Secondary | ICD-10-CM | POA: Diagnosis not present

## 2015-11-18 DIAGNOSIS — E785 Hyperlipidemia, unspecified: Secondary | ICD-10-CM | POA: Diagnosis not present

## 2015-11-18 DIAGNOSIS — I129 Hypertensive chronic kidney disease with stage 1 through stage 4 chronic kidney disease, or unspecified chronic kidney disease: Secondary | ICD-10-CM | POA: Diagnosis not present

## 2015-11-18 DIAGNOSIS — N189 Chronic kidney disease, unspecified: Secondary | ICD-10-CM | POA: Diagnosis not present

## 2015-11-18 DIAGNOSIS — L97821 Non-pressure chronic ulcer of other part of left lower leg limited to breakdown of skin: Secondary | ICD-10-CM | POA: Diagnosis not present

## 2015-11-18 DIAGNOSIS — E1122 Type 2 diabetes mellitus with diabetic chronic kidney disease: Secondary | ICD-10-CM | POA: Diagnosis not present

## 2015-11-18 DIAGNOSIS — E1140 Type 2 diabetes mellitus with diabetic neuropathy, unspecified: Secondary | ICD-10-CM | POA: Diagnosis not present

## 2015-11-18 DIAGNOSIS — M1991 Primary osteoarthritis, unspecified site: Secondary | ICD-10-CM | POA: Diagnosis not present

## 2015-11-18 DIAGNOSIS — K219 Gastro-esophageal reflux disease without esophagitis: Secondary | ICD-10-CM | POA: Diagnosis not present

## 2015-11-18 DIAGNOSIS — E559 Vitamin D deficiency, unspecified: Secondary | ICD-10-CM | POA: Diagnosis not present

## 2015-11-18 DIAGNOSIS — I872 Venous insufficiency (chronic) (peripheral): Secondary | ICD-10-CM | POA: Diagnosis not present

## 2015-11-18 DIAGNOSIS — E1151 Type 2 diabetes mellitus with diabetic peripheral angiopathy without gangrene: Secondary | ICD-10-CM | POA: Diagnosis not present

## 2015-11-18 DIAGNOSIS — D509 Iron deficiency anemia, unspecified: Secondary | ICD-10-CM | POA: Diagnosis not present

## 2015-11-21 DIAGNOSIS — E1122 Type 2 diabetes mellitus with diabetic chronic kidney disease: Secondary | ICD-10-CM | POA: Diagnosis not present

## 2015-11-21 DIAGNOSIS — E785 Hyperlipidemia, unspecified: Secondary | ICD-10-CM | POA: Diagnosis not present

## 2015-11-21 DIAGNOSIS — E1140 Type 2 diabetes mellitus with diabetic neuropathy, unspecified: Secondary | ICD-10-CM | POA: Diagnosis not present

## 2015-11-21 DIAGNOSIS — N189 Chronic kidney disease, unspecified: Secondary | ICD-10-CM | POA: Diagnosis not present

## 2015-11-21 DIAGNOSIS — M1991 Primary osteoarthritis, unspecified site: Secondary | ICD-10-CM | POA: Diagnosis not present

## 2015-11-21 DIAGNOSIS — I129 Hypertensive chronic kidney disease with stage 1 through stage 4 chronic kidney disease, or unspecified chronic kidney disease: Secondary | ICD-10-CM | POA: Diagnosis not present

## 2015-11-21 DIAGNOSIS — E559 Vitamin D deficiency, unspecified: Secondary | ICD-10-CM | POA: Diagnosis not present

## 2015-11-21 DIAGNOSIS — D509 Iron deficiency anemia, unspecified: Secondary | ICD-10-CM | POA: Diagnosis not present

## 2015-11-21 DIAGNOSIS — K219 Gastro-esophageal reflux disease without esophagitis: Secondary | ICD-10-CM | POA: Diagnosis not present

## 2015-11-21 DIAGNOSIS — I872 Venous insufficiency (chronic) (peripheral): Secondary | ICD-10-CM | POA: Diagnosis not present

## 2015-11-21 DIAGNOSIS — E1151 Type 2 diabetes mellitus with diabetic peripheral angiopathy without gangrene: Secondary | ICD-10-CM | POA: Diagnosis not present

## 2015-11-21 DIAGNOSIS — L97821 Non-pressure chronic ulcer of other part of left lower leg limited to breakdown of skin: Secondary | ICD-10-CM | POA: Diagnosis not present

## 2015-11-25 DIAGNOSIS — E559 Vitamin D deficiency, unspecified: Secondary | ICD-10-CM | POA: Diagnosis not present

## 2015-11-25 DIAGNOSIS — E1140 Type 2 diabetes mellitus with diabetic neuropathy, unspecified: Secondary | ICD-10-CM | POA: Diagnosis not present

## 2015-11-25 DIAGNOSIS — L97821 Non-pressure chronic ulcer of other part of left lower leg limited to breakdown of skin: Secondary | ICD-10-CM | POA: Diagnosis not present

## 2015-11-25 DIAGNOSIS — E1122 Type 2 diabetes mellitus with diabetic chronic kidney disease: Secondary | ICD-10-CM | POA: Diagnosis not present

## 2015-11-25 DIAGNOSIS — K219 Gastro-esophageal reflux disease without esophagitis: Secondary | ICD-10-CM | POA: Diagnosis not present

## 2015-11-25 DIAGNOSIS — D509 Iron deficiency anemia, unspecified: Secondary | ICD-10-CM | POA: Diagnosis not present

## 2015-11-25 DIAGNOSIS — I129 Hypertensive chronic kidney disease with stage 1 through stage 4 chronic kidney disease, or unspecified chronic kidney disease: Secondary | ICD-10-CM | POA: Diagnosis not present

## 2015-11-25 DIAGNOSIS — E1151 Type 2 diabetes mellitus with diabetic peripheral angiopathy without gangrene: Secondary | ICD-10-CM | POA: Diagnosis not present

## 2015-11-25 DIAGNOSIS — N189 Chronic kidney disease, unspecified: Secondary | ICD-10-CM | POA: Diagnosis not present

## 2015-11-25 DIAGNOSIS — M1991 Primary osteoarthritis, unspecified site: Secondary | ICD-10-CM | POA: Diagnosis not present

## 2015-11-25 DIAGNOSIS — I872 Venous insufficiency (chronic) (peripheral): Secondary | ICD-10-CM | POA: Diagnosis not present

## 2015-11-25 DIAGNOSIS — E785 Hyperlipidemia, unspecified: Secondary | ICD-10-CM | POA: Diagnosis not present

## 2015-11-28 DIAGNOSIS — E1122 Type 2 diabetes mellitus with diabetic chronic kidney disease: Secondary | ICD-10-CM | POA: Diagnosis not present

## 2015-11-28 DIAGNOSIS — D509 Iron deficiency anemia, unspecified: Secondary | ICD-10-CM | POA: Diagnosis not present

## 2015-11-28 DIAGNOSIS — N189 Chronic kidney disease, unspecified: Secondary | ICD-10-CM | POA: Diagnosis not present

## 2015-11-28 DIAGNOSIS — E559 Vitamin D deficiency, unspecified: Secondary | ICD-10-CM | POA: Diagnosis not present

## 2015-11-28 DIAGNOSIS — I872 Venous insufficiency (chronic) (peripheral): Secondary | ICD-10-CM | POA: Diagnosis not present

## 2015-11-28 DIAGNOSIS — I129 Hypertensive chronic kidney disease with stage 1 through stage 4 chronic kidney disease, or unspecified chronic kidney disease: Secondary | ICD-10-CM | POA: Diagnosis not present

## 2015-11-28 DIAGNOSIS — L97821 Non-pressure chronic ulcer of other part of left lower leg limited to breakdown of skin: Secondary | ICD-10-CM | POA: Diagnosis not present

## 2015-11-28 DIAGNOSIS — E1140 Type 2 diabetes mellitus with diabetic neuropathy, unspecified: Secondary | ICD-10-CM | POA: Diagnosis not present

## 2015-11-28 DIAGNOSIS — K219 Gastro-esophageal reflux disease without esophagitis: Secondary | ICD-10-CM | POA: Diagnosis not present

## 2015-11-28 DIAGNOSIS — M1991 Primary osteoarthritis, unspecified site: Secondary | ICD-10-CM | POA: Diagnosis not present

## 2015-11-28 DIAGNOSIS — E1151 Type 2 diabetes mellitus with diabetic peripheral angiopathy without gangrene: Secondary | ICD-10-CM | POA: Diagnosis not present

## 2015-11-28 DIAGNOSIS — E785 Hyperlipidemia, unspecified: Secondary | ICD-10-CM | POA: Diagnosis not present

## 2015-12-05 DIAGNOSIS — N189 Chronic kidney disease, unspecified: Secondary | ICD-10-CM | POA: Diagnosis not present

## 2015-12-05 DIAGNOSIS — I872 Venous insufficiency (chronic) (peripheral): Secondary | ICD-10-CM | POA: Diagnosis not present

## 2015-12-05 DIAGNOSIS — Z9181 History of falling: Secondary | ICD-10-CM | POA: Diagnosis not present

## 2015-12-05 DIAGNOSIS — Z7984 Long term (current) use of oral hypoglycemic drugs: Secondary | ICD-10-CM | POA: Diagnosis not present

## 2015-12-05 DIAGNOSIS — I129 Hypertensive chronic kidney disease with stage 1 through stage 4 chronic kidney disease, or unspecified chronic kidney disease: Secondary | ICD-10-CM | POA: Diagnosis not present

## 2015-12-05 DIAGNOSIS — E1140 Type 2 diabetes mellitus with diabetic neuropathy, unspecified: Secondary | ICD-10-CM | POA: Diagnosis not present

## 2015-12-05 DIAGNOSIS — M1991 Primary osteoarthritis, unspecified site: Secondary | ICD-10-CM | POA: Diagnosis not present

## 2015-12-05 DIAGNOSIS — E1151 Type 2 diabetes mellitus with diabetic peripheral angiopathy without gangrene: Secondary | ICD-10-CM | POA: Diagnosis not present

## 2015-12-05 DIAGNOSIS — Z7902 Long term (current) use of antithrombotics/antiplatelets: Secondary | ICD-10-CM | POA: Diagnosis not present

## 2015-12-05 DIAGNOSIS — E1122 Type 2 diabetes mellitus with diabetic chronic kidney disease: Secondary | ICD-10-CM | POA: Diagnosis not present

## 2015-12-05 DIAGNOSIS — Z7982 Long term (current) use of aspirin: Secondary | ICD-10-CM | POA: Diagnosis not present

## 2015-12-09 DIAGNOSIS — Z7984 Long term (current) use of oral hypoglycemic drugs: Secondary | ICD-10-CM | POA: Diagnosis not present

## 2015-12-09 DIAGNOSIS — E1122 Type 2 diabetes mellitus with diabetic chronic kidney disease: Secondary | ICD-10-CM | POA: Diagnosis not present

## 2015-12-09 DIAGNOSIS — Z7982 Long term (current) use of aspirin: Secondary | ICD-10-CM | POA: Diagnosis not present

## 2015-12-09 DIAGNOSIS — M1991 Primary osteoarthritis, unspecified site: Secondary | ICD-10-CM | POA: Diagnosis not present

## 2015-12-09 DIAGNOSIS — Z9181 History of falling: Secondary | ICD-10-CM | POA: Diagnosis not present

## 2015-12-09 DIAGNOSIS — I129 Hypertensive chronic kidney disease with stage 1 through stage 4 chronic kidney disease, or unspecified chronic kidney disease: Secondary | ICD-10-CM | POA: Diagnosis not present

## 2015-12-09 DIAGNOSIS — N189 Chronic kidney disease, unspecified: Secondary | ICD-10-CM | POA: Diagnosis not present

## 2015-12-09 DIAGNOSIS — E1151 Type 2 diabetes mellitus with diabetic peripheral angiopathy without gangrene: Secondary | ICD-10-CM | POA: Diagnosis not present

## 2015-12-09 DIAGNOSIS — I872 Venous insufficiency (chronic) (peripheral): Secondary | ICD-10-CM | POA: Diagnosis not present

## 2015-12-09 DIAGNOSIS — Z7902 Long term (current) use of antithrombotics/antiplatelets: Secondary | ICD-10-CM | POA: Diagnosis not present

## 2015-12-09 DIAGNOSIS — E1140 Type 2 diabetes mellitus with diabetic neuropathy, unspecified: Secondary | ICD-10-CM | POA: Diagnosis not present

## 2015-12-12 DIAGNOSIS — E1140 Type 2 diabetes mellitus with diabetic neuropathy, unspecified: Secondary | ICD-10-CM | POA: Diagnosis not present

## 2015-12-12 DIAGNOSIS — Z7982 Long term (current) use of aspirin: Secondary | ICD-10-CM | POA: Diagnosis not present

## 2015-12-12 DIAGNOSIS — I872 Venous insufficiency (chronic) (peripheral): Secondary | ICD-10-CM | POA: Diagnosis not present

## 2015-12-12 DIAGNOSIS — I129 Hypertensive chronic kidney disease with stage 1 through stage 4 chronic kidney disease, or unspecified chronic kidney disease: Secondary | ICD-10-CM | POA: Diagnosis not present

## 2015-12-12 DIAGNOSIS — E1122 Type 2 diabetes mellitus with diabetic chronic kidney disease: Secondary | ICD-10-CM | POA: Diagnosis not present

## 2015-12-12 DIAGNOSIS — N189 Chronic kidney disease, unspecified: Secondary | ICD-10-CM | POA: Diagnosis not present

## 2015-12-12 DIAGNOSIS — Z7902 Long term (current) use of antithrombotics/antiplatelets: Secondary | ICD-10-CM | POA: Diagnosis not present

## 2015-12-12 DIAGNOSIS — E1151 Type 2 diabetes mellitus with diabetic peripheral angiopathy without gangrene: Secondary | ICD-10-CM | POA: Diagnosis not present

## 2015-12-12 DIAGNOSIS — Z7984 Long term (current) use of oral hypoglycemic drugs: Secondary | ICD-10-CM | POA: Diagnosis not present

## 2015-12-12 DIAGNOSIS — Z9181 History of falling: Secondary | ICD-10-CM | POA: Diagnosis not present

## 2015-12-12 DIAGNOSIS — M1991 Primary osteoarthritis, unspecified site: Secondary | ICD-10-CM | POA: Diagnosis not present

## 2015-12-16 DIAGNOSIS — Z9181 History of falling: Secondary | ICD-10-CM | POA: Diagnosis not present

## 2015-12-16 DIAGNOSIS — Z7982 Long term (current) use of aspirin: Secondary | ICD-10-CM | POA: Diagnosis not present

## 2015-12-16 DIAGNOSIS — M1991 Primary osteoarthritis, unspecified site: Secondary | ICD-10-CM | POA: Diagnosis not present

## 2015-12-16 DIAGNOSIS — E1151 Type 2 diabetes mellitus with diabetic peripheral angiopathy without gangrene: Secondary | ICD-10-CM | POA: Diagnosis not present

## 2015-12-16 DIAGNOSIS — E1122 Type 2 diabetes mellitus with diabetic chronic kidney disease: Secondary | ICD-10-CM | POA: Diagnosis not present

## 2015-12-16 DIAGNOSIS — I129 Hypertensive chronic kidney disease with stage 1 through stage 4 chronic kidney disease, or unspecified chronic kidney disease: Secondary | ICD-10-CM | POA: Diagnosis not present

## 2015-12-16 DIAGNOSIS — I872 Venous insufficiency (chronic) (peripheral): Secondary | ICD-10-CM | POA: Diagnosis not present

## 2015-12-16 DIAGNOSIS — N189 Chronic kidney disease, unspecified: Secondary | ICD-10-CM | POA: Diagnosis not present

## 2015-12-16 DIAGNOSIS — E1140 Type 2 diabetes mellitus with diabetic neuropathy, unspecified: Secondary | ICD-10-CM | POA: Diagnosis not present

## 2015-12-16 DIAGNOSIS — Z7984 Long term (current) use of oral hypoglycemic drugs: Secondary | ICD-10-CM | POA: Diagnosis not present

## 2015-12-16 DIAGNOSIS — Z7902 Long term (current) use of antithrombotics/antiplatelets: Secondary | ICD-10-CM | POA: Diagnosis not present

## 2015-12-19 DIAGNOSIS — Z7902 Long term (current) use of antithrombotics/antiplatelets: Secondary | ICD-10-CM | POA: Diagnosis not present

## 2015-12-19 DIAGNOSIS — E1122 Type 2 diabetes mellitus with diabetic chronic kidney disease: Secondary | ICD-10-CM | POA: Diagnosis not present

## 2015-12-19 DIAGNOSIS — E1151 Type 2 diabetes mellitus with diabetic peripheral angiopathy without gangrene: Secondary | ICD-10-CM | POA: Diagnosis not present

## 2015-12-19 DIAGNOSIS — N189 Chronic kidney disease, unspecified: Secondary | ICD-10-CM | POA: Diagnosis not present

## 2015-12-19 DIAGNOSIS — M1991 Primary osteoarthritis, unspecified site: Secondary | ICD-10-CM | POA: Diagnosis not present

## 2015-12-19 DIAGNOSIS — Z7982 Long term (current) use of aspirin: Secondary | ICD-10-CM | POA: Diagnosis not present

## 2015-12-19 DIAGNOSIS — Z7984 Long term (current) use of oral hypoglycemic drugs: Secondary | ICD-10-CM | POA: Diagnosis not present

## 2015-12-19 DIAGNOSIS — I872 Venous insufficiency (chronic) (peripheral): Secondary | ICD-10-CM | POA: Diagnosis not present

## 2015-12-19 DIAGNOSIS — Z9181 History of falling: Secondary | ICD-10-CM | POA: Diagnosis not present

## 2015-12-19 DIAGNOSIS — E1140 Type 2 diabetes mellitus with diabetic neuropathy, unspecified: Secondary | ICD-10-CM | POA: Diagnosis not present

## 2015-12-19 DIAGNOSIS — I129 Hypertensive chronic kidney disease with stage 1 through stage 4 chronic kidney disease, or unspecified chronic kidney disease: Secondary | ICD-10-CM | POA: Diagnosis not present

## 2015-12-23 DIAGNOSIS — I872 Venous insufficiency (chronic) (peripheral): Secondary | ICD-10-CM | POA: Diagnosis not present

## 2015-12-23 DIAGNOSIS — E1151 Type 2 diabetes mellitus with diabetic peripheral angiopathy without gangrene: Secondary | ICD-10-CM | POA: Diagnosis not present

## 2015-12-23 DIAGNOSIS — Z7984 Long term (current) use of oral hypoglycemic drugs: Secondary | ICD-10-CM | POA: Diagnosis not present

## 2015-12-23 DIAGNOSIS — Z7902 Long term (current) use of antithrombotics/antiplatelets: Secondary | ICD-10-CM | POA: Diagnosis not present

## 2015-12-23 DIAGNOSIS — Z7982 Long term (current) use of aspirin: Secondary | ICD-10-CM | POA: Diagnosis not present

## 2015-12-23 DIAGNOSIS — N189 Chronic kidney disease, unspecified: Secondary | ICD-10-CM | POA: Diagnosis not present

## 2015-12-23 DIAGNOSIS — E1140 Type 2 diabetes mellitus with diabetic neuropathy, unspecified: Secondary | ICD-10-CM | POA: Diagnosis not present

## 2015-12-23 DIAGNOSIS — E1122 Type 2 diabetes mellitus with diabetic chronic kidney disease: Secondary | ICD-10-CM | POA: Diagnosis not present

## 2015-12-23 DIAGNOSIS — M1991 Primary osteoarthritis, unspecified site: Secondary | ICD-10-CM | POA: Diagnosis not present

## 2015-12-23 DIAGNOSIS — I129 Hypertensive chronic kidney disease with stage 1 through stage 4 chronic kidney disease, or unspecified chronic kidney disease: Secondary | ICD-10-CM | POA: Diagnosis not present

## 2015-12-23 DIAGNOSIS — Z9181 History of falling: Secondary | ICD-10-CM | POA: Diagnosis not present

## 2015-12-26 DIAGNOSIS — Z9181 History of falling: Secondary | ICD-10-CM | POA: Diagnosis not present

## 2015-12-26 DIAGNOSIS — E1140 Type 2 diabetes mellitus with diabetic neuropathy, unspecified: Secondary | ICD-10-CM | POA: Diagnosis not present

## 2015-12-26 DIAGNOSIS — N189 Chronic kidney disease, unspecified: Secondary | ICD-10-CM | POA: Diagnosis not present

## 2015-12-26 DIAGNOSIS — M1991 Primary osteoarthritis, unspecified site: Secondary | ICD-10-CM | POA: Diagnosis not present

## 2015-12-26 DIAGNOSIS — I129 Hypertensive chronic kidney disease with stage 1 through stage 4 chronic kidney disease, or unspecified chronic kidney disease: Secondary | ICD-10-CM | POA: Diagnosis not present

## 2015-12-26 DIAGNOSIS — Z7902 Long term (current) use of antithrombotics/antiplatelets: Secondary | ICD-10-CM | POA: Diagnosis not present

## 2015-12-26 DIAGNOSIS — I872 Venous insufficiency (chronic) (peripheral): Secondary | ICD-10-CM | POA: Diagnosis not present

## 2015-12-26 DIAGNOSIS — E1122 Type 2 diabetes mellitus with diabetic chronic kidney disease: Secondary | ICD-10-CM | POA: Diagnosis not present

## 2015-12-26 DIAGNOSIS — E1151 Type 2 diabetes mellitus with diabetic peripheral angiopathy without gangrene: Secondary | ICD-10-CM | POA: Diagnosis not present

## 2015-12-26 DIAGNOSIS — Z7984 Long term (current) use of oral hypoglycemic drugs: Secondary | ICD-10-CM | POA: Diagnosis not present

## 2015-12-26 DIAGNOSIS — Z7982 Long term (current) use of aspirin: Secondary | ICD-10-CM | POA: Diagnosis not present

## 2015-12-30 DIAGNOSIS — Z7984 Long term (current) use of oral hypoglycemic drugs: Secondary | ICD-10-CM | POA: Diagnosis not present

## 2015-12-30 DIAGNOSIS — Z7982 Long term (current) use of aspirin: Secondary | ICD-10-CM | POA: Diagnosis not present

## 2015-12-30 DIAGNOSIS — E1140 Type 2 diabetes mellitus with diabetic neuropathy, unspecified: Secondary | ICD-10-CM | POA: Diagnosis not present

## 2015-12-30 DIAGNOSIS — M1991 Primary osteoarthritis, unspecified site: Secondary | ICD-10-CM | POA: Diagnosis not present

## 2015-12-30 DIAGNOSIS — E1122 Type 2 diabetes mellitus with diabetic chronic kidney disease: Secondary | ICD-10-CM | POA: Diagnosis not present

## 2015-12-30 DIAGNOSIS — I129 Hypertensive chronic kidney disease with stage 1 through stage 4 chronic kidney disease, or unspecified chronic kidney disease: Secondary | ICD-10-CM | POA: Diagnosis not present

## 2015-12-30 DIAGNOSIS — N189 Chronic kidney disease, unspecified: Secondary | ICD-10-CM | POA: Diagnosis not present

## 2015-12-30 DIAGNOSIS — I872 Venous insufficiency (chronic) (peripheral): Secondary | ICD-10-CM | POA: Diagnosis not present

## 2015-12-30 DIAGNOSIS — Z9181 History of falling: Secondary | ICD-10-CM | POA: Diagnosis not present

## 2015-12-30 DIAGNOSIS — Z7902 Long term (current) use of antithrombotics/antiplatelets: Secondary | ICD-10-CM | POA: Diagnosis not present

## 2015-12-30 DIAGNOSIS — E1151 Type 2 diabetes mellitus with diabetic peripheral angiopathy without gangrene: Secondary | ICD-10-CM | POA: Diagnosis not present

## 2016-01-02 DIAGNOSIS — Z7984 Long term (current) use of oral hypoglycemic drugs: Secondary | ICD-10-CM | POA: Diagnosis not present

## 2016-01-02 DIAGNOSIS — I872 Venous insufficiency (chronic) (peripheral): Secondary | ICD-10-CM | POA: Diagnosis not present

## 2016-01-02 DIAGNOSIS — Z9181 History of falling: Secondary | ICD-10-CM | POA: Diagnosis not present

## 2016-01-02 DIAGNOSIS — Z7982 Long term (current) use of aspirin: Secondary | ICD-10-CM | POA: Diagnosis not present

## 2016-01-02 DIAGNOSIS — M1991 Primary osteoarthritis, unspecified site: Secondary | ICD-10-CM | POA: Diagnosis not present

## 2016-01-02 DIAGNOSIS — E1122 Type 2 diabetes mellitus with diabetic chronic kidney disease: Secondary | ICD-10-CM | POA: Diagnosis not present

## 2016-01-02 DIAGNOSIS — E1151 Type 2 diabetes mellitus with diabetic peripheral angiopathy without gangrene: Secondary | ICD-10-CM | POA: Diagnosis not present

## 2016-01-02 DIAGNOSIS — E1140 Type 2 diabetes mellitus with diabetic neuropathy, unspecified: Secondary | ICD-10-CM | POA: Diagnosis not present

## 2016-01-02 DIAGNOSIS — N189 Chronic kidney disease, unspecified: Secondary | ICD-10-CM | POA: Diagnosis not present

## 2016-01-02 DIAGNOSIS — Z7902 Long term (current) use of antithrombotics/antiplatelets: Secondary | ICD-10-CM | POA: Diagnosis not present

## 2016-01-02 DIAGNOSIS — I129 Hypertensive chronic kidney disease with stage 1 through stage 4 chronic kidney disease, or unspecified chronic kidney disease: Secondary | ICD-10-CM | POA: Diagnosis not present

## 2016-01-06 DIAGNOSIS — E1122 Type 2 diabetes mellitus with diabetic chronic kidney disease: Secondary | ICD-10-CM | POA: Diagnosis not present

## 2016-01-06 DIAGNOSIS — Z7984 Long term (current) use of oral hypoglycemic drugs: Secondary | ICD-10-CM | POA: Diagnosis not present

## 2016-01-06 DIAGNOSIS — Z9181 History of falling: Secondary | ICD-10-CM | POA: Diagnosis not present

## 2016-01-06 DIAGNOSIS — I129 Hypertensive chronic kidney disease with stage 1 through stage 4 chronic kidney disease, or unspecified chronic kidney disease: Secondary | ICD-10-CM | POA: Diagnosis not present

## 2016-01-06 DIAGNOSIS — E1151 Type 2 diabetes mellitus with diabetic peripheral angiopathy without gangrene: Secondary | ICD-10-CM | POA: Diagnosis not present

## 2016-01-06 DIAGNOSIS — N189 Chronic kidney disease, unspecified: Secondary | ICD-10-CM | POA: Diagnosis not present

## 2016-01-06 DIAGNOSIS — E1140 Type 2 diabetes mellitus with diabetic neuropathy, unspecified: Secondary | ICD-10-CM | POA: Diagnosis not present

## 2016-01-06 DIAGNOSIS — Z7982 Long term (current) use of aspirin: Secondary | ICD-10-CM | POA: Diagnosis not present

## 2016-01-06 DIAGNOSIS — Z7902 Long term (current) use of antithrombotics/antiplatelets: Secondary | ICD-10-CM | POA: Diagnosis not present

## 2016-01-06 DIAGNOSIS — M1991 Primary osteoarthritis, unspecified site: Secondary | ICD-10-CM | POA: Diagnosis not present

## 2016-01-06 DIAGNOSIS — I872 Venous insufficiency (chronic) (peripheral): Secondary | ICD-10-CM | POA: Diagnosis not present

## 2016-01-10 DIAGNOSIS — Z7982 Long term (current) use of aspirin: Secondary | ICD-10-CM | POA: Diagnosis not present

## 2016-01-10 DIAGNOSIS — E1140 Type 2 diabetes mellitus with diabetic neuropathy, unspecified: Secondary | ICD-10-CM | POA: Diagnosis not present

## 2016-01-10 DIAGNOSIS — I129 Hypertensive chronic kidney disease with stage 1 through stage 4 chronic kidney disease, or unspecified chronic kidney disease: Secondary | ICD-10-CM | POA: Diagnosis not present

## 2016-01-10 DIAGNOSIS — Z7902 Long term (current) use of antithrombotics/antiplatelets: Secondary | ICD-10-CM | POA: Diagnosis not present

## 2016-01-10 DIAGNOSIS — Z9181 History of falling: Secondary | ICD-10-CM | POA: Diagnosis not present

## 2016-01-10 DIAGNOSIS — N189 Chronic kidney disease, unspecified: Secondary | ICD-10-CM | POA: Diagnosis not present

## 2016-01-10 DIAGNOSIS — E1122 Type 2 diabetes mellitus with diabetic chronic kidney disease: Secondary | ICD-10-CM | POA: Diagnosis not present

## 2016-01-10 DIAGNOSIS — I872 Venous insufficiency (chronic) (peripheral): Secondary | ICD-10-CM | POA: Diagnosis not present

## 2016-01-10 DIAGNOSIS — M1991 Primary osteoarthritis, unspecified site: Secondary | ICD-10-CM | POA: Diagnosis not present

## 2016-01-10 DIAGNOSIS — E1151 Type 2 diabetes mellitus with diabetic peripheral angiopathy without gangrene: Secondary | ICD-10-CM | POA: Diagnosis not present

## 2016-01-10 DIAGNOSIS — Z7984 Long term (current) use of oral hypoglycemic drugs: Secondary | ICD-10-CM | POA: Diagnosis not present

## 2016-01-13 DIAGNOSIS — Z7984 Long term (current) use of oral hypoglycemic drugs: Secondary | ICD-10-CM | POA: Diagnosis not present

## 2016-01-13 DIAGNOSIS — Z7982 Long term (current) use of aspirin: Secondary | ICD-10-CM | POA: Diagnosis not present

## 2016-01-13 DIAGNOSIS — N189 Chronic kidney disease, unspecified: Secondary | ICD-10-CM | POA: Diagnosis not present

## 2016-01-13 DIAGNOSIS — I129 Hypertensive chronic kidney disease with stage 1 through stage 4 chronic kidney disease, or unspecified chronic kidney disease: Secondary | ICD-10-CM | POA: Diagnosis not present

## 2016-01-13 DIAGNOSIS — I872 Venous insufficiency (chronic) (peripheral): Secondary | ICD-10-CM | POA: Diagnosis not present

## 2016-01-13 DIAGNOSIS — E1140 Type 2 diabetes mellitus with diabetic neuropathy, unspecified: Secondary | ICD-10-CM | POA: Diagnosis not present

## 2016-01-13 DIAGNOSIS — E1122 Type 2 diabetes mellitus with diabetic chronic kidney disease: Secondary | ICD-10-CM | POA: Diagnosis not present

## 2016-01-13 DIAGNOSIS — Z7902 Long term (current) use of antithrombotics/antiplatelets: Secondary | ICD-10-CM | POA: Diagnosis not present

## 2016-01-13 DIAGNOSIS — E1151 Type 2 diabetes mellitus with diabetic peripheral angiopathy without gangrene: Secondary | ICD-10-CM | POA: Diagnosis not present

## 2016-01-13 DIAGNOSIS — M1991 Primary osteoarthritis, unspecified site: Secondary | ICD-10-CM | POA: Diagnosis not present

## 2016-01-13 DIAGNOSIS — Z9181 History of falling: Secondary | ICD-10-CM | POA: Diagnosis not present

## 2016-01-14 DIAGNOSIS — I739 Peripheral vascular disease, unspecified: Secondary | ICD-10-CM | POA: Diagnosis not present

## 2016-01-14 DIAGNOSIS — E1165 Type 2 diabetes mellitus with hyperglycemia: Secondary | ICD-10-CM | POA: Diagnosis not present

## 2016-01-14 DIAGNOSIS — E784 Other hyperlipidemia: Secondary | ICD-10-CM | POA: Diagnosis not present

## 2016-01-14 DIAGNOSIS — I10 Essential (primary) hypertension: Secondary | ICD-10-CM | POA: Diagnosis not present

## 2016-01-14 DIAGNOSIS — D509 Iron deficiency anemia, unspecified: Secondary | ICD-10-CM | POA: Diagnosis not present

## 2016-01-16 DIAGNOSIS — I129 Hypertensive chronic kidney disease with stage 1 through stage 4 chronic kidney disease, or unspecified chronic kidney disease: Secondary | ICD-10-CM | POA: Diagnosis not present

## 2016-01-16 DIAGNOSIS — E1140 Type 2 diabetes mellitus with diabetic neuropathy, unspecified: Secondary | ICD-10-CM | POA: Diagnosis not present

## 2016-01-16 DIAGNOSIS — Z7982 Long term (current) use of aspirin: Secondary | ICD-10-CM | POA: Diagnosis not present

## 2016-01-16 DIAGNOSIS — Z9181 History of falling: Secondary | ICD-10-CM | POA: Diagnosis not present

## 2016-01-16 DIAGNOSIS — Z7902 Long term (current) use of antithrombotics/antiplatelets: Secondary | ICD-10-CM | POA: Diagnosis not present

## 2016-01-16 DIAGNOSIS — E1122 Type 2 diabetes mellitus with diabetic chronic kidney disease: Secondary | ICD-10-CM | POA: Diagnosis not present

## 2016-01-16 DIAGNOSIS — Z7984 Long term (current) use of oral hypoglycemic drugs: Secondary | ICD-10-CM | POA: Diagnosis not present

## 2016-01-16 DIAGNOSIS — I872 Venous insufficiency (chronic) (peripheral): Secondary | ICD-10-CM | POA: Diagnosis not present

## 2016-01-16 DIAGNOSIS — M1991 Primary osteoarthritis, unspecified site: Secondary | ICD-10-CM | POA: Diagnosis not present

## 2016-01-16 DIAGNOSIS — E1151 Type 2 diabetes mellitus with diabetic peripheral angiopathy without gangrene: Secondary | ICD-10-CM | POA: Diagnosis not present

## 2016-01-16 DIAGNOSIS — N189 Chronic kidney disease, unspecified: Secondary | ICD-10-CM | POA: Diagnosis not present

## 2016-01-17 ENCOUNTER — Encounter (INDEPENDENT_AMBULATORY_CARE_PROVIDER_SITE_OTHER): Payer: Self-pay

## 2016-01-17 ENCOUNTER — Ambulatory Visit (INDEPENDENT_AMBULATORY_CARE_PROVIDER_SITE_OTHER): Payer: Self-pay | Admitting: Vascular Surgery

## 2016-01-23 DIAGNOSIS — Z7982 Long term (current) use of aspirin: Secondary | ICD-10-CM | POA: Diagnosis not present

## 2016-01-23 DIAGNOSIS — I129 Hypertensive chronic kidney disease with stage 1 through stage 4 chronic kidney disease, or unspecified chronic kidney disease: Secondary | ICD-10-CM | POA: Diagnosis not present

## 2016-01-23 DIAGNOSIS — N189 Chronic kidney disease, unspecified: Secondary | ICD-10-CM | POA: Diagnosis not present

## 2016-01-23 DIAGNOSIS — Z7984 Long term (current) use of oral hypoglycemic drugs: Secondary | ICD-10-CM | POA: Diagnosis not present

## 2016-01-23 DIAGNOSIS — E1151 Type 2 diabetes mellitus with diabetic peripheral angiopathy without gangrene: Secondary | ICD-10-CM | POA: Diagnosis not present

## 2016-01-23 DIAGNOSIS — I872 Venous insufficiency (chronic) (peripheral): Secondary | ICD-10-CM | POA: Diagnosis not present

## 2016-01-23 DIAGNOSIS — Z7902 Long term (current) use of antithrombotics/antiplatelets: Secondary | ICD-10-CM | POA: Diagnosis not present

## 2016-01-23 DIAGNOSIS — E1122 Type 2 diabetes mellitus with diabetic chronic kidney disease: Secondary | ICD-10-CM | POA: Diagnosis not present

## 2016-01-23 DIAGNOSIS — E1140 Type 2 diabetes mellitus with diabetic neuropathy, unspecified: Secondary | ICD-10-CM | POA: Diagnosis not present

## 2016-01-23 DIAGNOSIS — Z9181 History of falling: Secondary | ICD-10-CM | POA: Diagnosis not present

## 2016-01-23 DIAGNOSIS — M1991 Primary osteoarthritis, unspecified site: Secondary | ICD-10-CM | POA: Diagnosis not present

## 2016-01-27 DIAGNOSIS — E1140 Type 2 diabetes mellitus with diabetic neuropathy, unspecified: Secondary | ICD-10-CM | POA: Diagnosis not present

## 2016-01-27 DIAGNOSIS — E1151 Type 2 diabetes mellitus with diabetic peripheral angiopathy without gangrene: Secondary | ICD-10-CM | POA: Diagnosis not present

## 2016-01-27 DIAGNOSIS — I129 Hypertensive chronic kidney disease with stage 1 through stage 4 chronic kidney disease, or unspecified chronic kidney disease: Secondary | ICD-10-CM | POA: Diagnosis not present

## 2016-01-27 DIAGNOSIS — Z7984 Long term (current) use of oral hypoglycemic drugs: Secondary | ICD-10-CM | POA: Diagnosis not present

## 2016-01-27 DIAGNOSIS — I872 Venous insufficiency (chronic) (peripheral): Secondary | ICD-10-CM | POA: Diagnosis not present

## 2016-01-27 DIAGNOSIS — E1122 Type 2 diabetes mellitus with diabetic chronic kidney disease: Secondary | ICD-10-CM | POA: Diagnosis not present

## 2016-01-27 DIAGNOSIS — M1991 Primary osteoarthritis, unspecified site: Secondary | ICD-10-CM | POA: Diagnosis not present

## 2016-01-27 DIAGNOSIS — Z9181 History of falling: Secondary | ICD-10-CM | POA: Diagnosis not present

## 2016-01-27 DIAGNOSIS — N189 Chronic kidney disease, unspecified: Secondary | ICD-10-CM | POA: Diagnosis not present

## 2016-01-27 DIAGNOSIS — Z7902 Long term (current) use of antithrombotics/antiplatelets: Secondary | ICD-10-CM | POA: Diagnosis not present

## 2016-01-27 DIAGNOSIS — Z7982 Long term (current) use of aspirin: Secondary | ICD-10-CM | POA: Diagnosis not present

## 2016-01-29 DIAGNOSIS — E1151 Type 2 diabetes mellitus with diabetic peripheral angiopathy without gangrene: Secondary | ICD-10-CM | POA: Diagnosis not present

## 2016-01-29 DIAGNOSIS — E1140 Type 2 diabetes mellitus with diabetic neuropathy, unspecified: Secondary | ICD-10-CM | POA: Diagnosis not present

## 2016-01-29 DIAGNOSIS — E1122 Type 2 diabetes mellitus with diabetic chronic kidney disease: Secondary | ICD-10-CM | POA: Diagnosis not present

## 2016-01-29 DIAGNOSIS — Z7984 Long term (current) use of oral hypoglycemic drugs: Secondary | ICD-10-CM | POA: Diagnosis not present

## 2016-01-29 DIAGNOSIS — Z9181 History of falling: Secondary | ICD-10-CM | POA: Diagnosis not present

## 2016-01-29 DIAGNOSIS — N189 Chronic kidney disease, unspecified: Secondary | ICD-10-CM | POA: Diagnosis not present

## 2016-01-29 DIAGNOSIS — I872 Venous insufficiency (chronic) (peripheral): Secondary | ICD-10-CM | POA: Diagnosis not present

## 2016-01-29 DIAGNOSIS — M1991 Primary osteoarthritis, unspecified site: Secondary | ICD-10-CM | POA: Diagnosis not present

## 2016-01-29 DIAGNOSIS — I129 Hypertensive chronic kidney disease with stage 1 through stage 4 chronic kidney disease, or unspecified chronic kidney disease: Secondary | ICD-10-CM | POA: Diagnosis not present

## 2016-01-29 DIAGNOSIS — Z7902 Long term (current) use of antithrombotics/antiplatelets: Secondary | ICD-10-CM | POA: Diagnosis not present

## 2016-01-29 DIAGNOSIS — Z7982 Long term (current) use of aspirin: Secondary | ICD-10-CM | POA: Diagnosis not present

## 2016-03-04 ENCOUNTER — Ambulatory Visit (INDEPENDENT_AMBULATORY_CARE_PROVIDER_SITE_OTHER): Payer: Self-pay | Admitting: Vascular Surgery

## 2016-03-04 ENCOUNTER — Encounter (INDEPENDENT_AMBULATORY_CARE_PROVIDER_SITE_OTHER): Payer: Medicare Other

## 2016-04-08 ENCOUNTER — Encounter (INDEPENDENT_AMBULATORY_CARE_PROVIDER_SITE_OTHER): Payer: Medicare Other

## 2016-04-08 ENCOUNTER — Ambulatory Visit (INDEPENDENT_AMBULATORY_CARE_PROVIDER_SITE_OTHER): Payer: Self-pay | Admitting: Vascular Surgery

## 2016-05-19 ENCOUNTER — Other Ambulatory Visit (INDEPENDENT_AMBULATORY_CARE_PROVIDER_SITE_OTHER): Payer: Self-pay | Admitting: Vascular Surgery

## 2016-05-19 ENCOUNTER — Encounter: Payer: Medicare Other | Attending: Nurse Practitioner | Admitting: Nurse Practitioner

## 2016-05-19 DIAGNOSIS — R600 Localized edema: Secondary | ICD-10-CM | POA: Diagnosis not present

## 2016-05-19 DIAGNOSIS — I87312 Chronic venous hypertension (idiopathic) with ulcer of left lower extremity: Secondary | ICD-10-CM | POA: Insufficient documentation

## 2016-05-19 DIAGNOSIS — E119 Type 2 diabetes mellitus without complications: Secondary | ICD-10-CM | POA: Diagnosis not present

## 2016-05-19 DIAGNOSIS — E11622 Type 2 diabetes mellitus with other skin ulcer: Secondary | ICD-10-CM | POA: Diagnosis not present

## 2016-05-19 DIAGNOSIS — I10 Essential (primary) hypertension: Secondary | ICD-10-CM | POA: Diagnosis not present

## 2016-05-19 DIAGNOSIS — Z91041 Radiographic dye allergy status: Secondary | ICD-10-CM | POA: Insufficient documentation

## 2016-05-19 DIAGNOSIS — T22032A Burn of unspecified degree of left upper arm, initial encounter: Secondary | ICD-10-CM | POA: Diagnosis not present

## 2016-05-19 DIAGNOSIS — Z7984 Long term (current) use of oral hypoglycemic drugs: Secondary | ICD-10-CM | POA: Diagnosis not present

## 2016-05-19 DIAGNOSIS — L97822 Non-pressure chronic ulcer of other part of left lower leg with fat layer exposed: Secondary | ICD-10-CM | POA: Diagnosis not present

## 2016-05-19 DIAGNOSIS — M1990 Unspecified osteoarthritis, unspecified site: Secondary | ICD-10-CM | POA: Diagnosis not present

## 2016-05-19 DIAGNOSIS — X58XXXA Exposure to other specified factors, initial encounter: Secondary | ICD-10-CM | POA: Insufficient documentation

## 2016-05-19 DIAGNOSIS — L97821 Non-pressure chronic ulcer of other part of left lower leg limited to breakdown of skin: Secondary | ICD-10-CM | POA: Diagnosis not present

## 2016-05-19 DIAGNOSIS — Z885 Allergy status to narcotic agent status: Secondary | ICD-10-CM | POA: Insufficient documentation

## 2016-05-19 DIAGNOSIS — I739 Peripheral vascular disease, unspecified: Secondary | ICD-10-CM

## 2016-05-19 DIAGNOSIS — E1151 Type 2 diabetes mellitus with diabetic peripheral angiopathy without gangrene: Secondary | ICD-10-CM | POA: Insufficient documentation

## 2016-05-19 DIAGNOSIS — I872 Venous insufficiency (chronic) (peripheral): Secondary | ICD-10-CM | POA: Diagnosis not present

## 2016-05-19 DIAGNOSIS — Z880 Allergy status to penicillin: Secondary | ICD-10-CM | POA: Insufficient documentation

## 2016-05-19 DIAGNOSIS — T22212A Burn of second degree of left forearm, initial encounter: Secondary | ICD-10-CM | POA: Diagnosis not present

## 2016-05-20 ENCOUNTER — Ambulatory Visit (INDEPENDENT_AMBULATORY_CARE_PROVIDER_SITE_OTHER): Payer: Self-pay | Admitting: Vascular Surgery

## 2016-05-20 ENCOUNTER — Ambulatory Visit: Payer: Self-pay | Admitting: Nurse Practitioner

## 2016-05-20 ENCOUNTER — Encounter (INDEPENDENT_AMBULATORY_CARE_PROVIDER_SITE_OTHER): Payer: Medicare Other

## 2016-05-20 NOTE — Progress Notes (Signed)
[name] ([id number]) Visit Report for 05/19/2016 Chief Complaint Document Details Patient Name: [name], [name] Date of Service: 05/19/2016 1:15 PM Medical Record Number: [id number] Patient Account Number: [id number] Date of Birth/Sex: [birth year] (71 y.o. Female) Treating RN: Curtis Sites Primary Care Provider: Sherrie Mustache Other Clinician: Referring Provider: Sherrie Mustache Treating Provider/Extender: Kathreen Cosier in Treatment: 0 Information Obtained from: Patient Chief Complaint patient is here for initial evaluation of left lower extremity ulcers and a burn to the left forearm Electronic Signature(s) Signed: 05/19/2016 5:37:56 PM By: Bonnell Public Entered By: Bonnell Public on 05/19/2016 17:37:56 [name], [name] ([id number]) -------------------------------------------------------------------------------- Debridement Details Patient Name: [name] Date of Service: 05/19/2016 1:15 PM Medical Record Number: [id number] Patient Account Number: [id number] Date of Birth/Sex: 30-Jan-1946 (70 y.o. Female) Treating RN: Curtis Sites Primary Care Provider: Sherrie Mustache Other Clinician: Referring Provider: Sherrie Mustache Treating Provider/Extender: Kathreen Cosier in Treatment: 0 Debridement Performed for Wound #8 Left Forearm Assessment: Performed By: Physician Bonnell Public, NP Debridement: Open Wound/Selective Debridement Selective Description: Pre-procedure Yes - 14:36 Verification/Time Out Taken: Start Time: 14:36 Pain Control: Lidocaine 4% Topical Solution Level: Non-Viable Tissue Total Area Debrided (L x 0.5 (cm) x 0.5 (cm) = 0.25 (cm) W): Tissue and other Non-Viable, Fibrin/Slough material debrided: Instrument: Curette Bleeding: None End Time: 14:38 Procedural Pain: 0 Post Procedural Pain: 0 Response to Treatment: Procedure was tolerated well Post Debridement Measurements of Total Wound Length: (cm) 2.5 Width: (cm) 1.4 Depth: (cm) 0.2 Volume:  (cm) 0.55 Character of Wound/Ulcer Post Improved Debridement: Severity of Tissue Post Debridement: Limited to breakdown of skin Post Procedure Diagnosis Same as Pre-procedure Electronic Signature(s) Signed: 05/19/2016 5:37:11 PM By: Bonnell Public Signed: 05/19/2016 5:52:35 PM By: Curtis Sites Previous Signature: 05/19/2016 5:36:56 PM Version By: Ermalinda Memos, [name] ([id number]) Entered By: Bonnell Public on 05/19/2016 17:37:11 [name] ([id number]) -------------------------------------------------------------------------------- HPI Details Patient Name: [name] Date of Service: 05/19/2016 1:15 PM Medical Record Number: [id number] Patient Account Number: [id number] Date of Birth/Sex: [birth year] (71 y.o. Female) Treating RN: Curtis Sites Primary Care Provider: Sherrie Mustache Other Clinician: Referring Provider: Sherrie Mustache Treating Provider/Extender: Kathreen Cosier in Treatment: 0 History of Present Illness Location: left lower extremity and left forearm Duration: left lower extremity ulcers have been present for approximately 2 weeks (blisters formed approx 3 weeks ago) and the left forearm for less than 1 week Context: the left lower extremity ulcers are secondary to edema and spontaneous blister formation; the left forearm is secondary to a burn HPI Description: Pleasant 71 year old with diabetes (no hemoglobin A1c available) and severe peripheral vascular disease. She reports a prior left lower extremity stent. Records unavailable. Underwent multiple level angioplasty of left lower extremity 10/29/2014 by Dr. Wyn Quaker. Developed left calf ulcerations in late December 2016. Denies any trauma. Seen by her PCP. Bactrim prescribed. Referred to the wound clinic. Biopsy culture 02/20/2015 grew methicillin sensitive staph aureus, sensitive to Bactrim. Performing dressing changes with Prisma. Using a Tubigrip for edema control. Scheduled to see Dr. Wyn Quaker in follow-up  02/25/2015. Cancelled because of weather. Rescheduled for 03/06/2015. She returns to clinic for follow-up and is without new complaints. Pain improved. No ischemic rest pain. No fever or chills. Minimal drainage. 03/13/2015 -- records obtained from [facility] shows that she was seen by Dr. dew on 03/12/2015 and given tramadol and other symptomatic treatment. Recent ABI done showed right to be 0.94 and left to be 0.47 a left lower extremity arterial duplex was notable for an  occluded stent and distal ATA. Recommendations were for left lower extremity angiogram with intervention. on 03/11/2015 she was taken up for a aortogram with angioplasty of the left posterior tibial artery and tibioperoneal trunk, above-knee popliteal artery and almost entire SFA, stent placement to the SFA and popliteal artery. 03/20/15 her arterial interventions noted. The patient states her left leg feels better. He ischemic wound on her left anterior leg. She has been using Santyl 04/09/15; the patient arrives back today. 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 dew. I was concerned about her last week as she had weeping edema fluid and areas of threatened ulceration around her wound. I ordered a silver alginate, Profore light wrap. She comes back today with an Radio broadcast assistant on apparently applied by Dr. Driscilla Grammes office. Her edema is under much better control and the weeping area here last week surrounding her major wound has resolved. She does have another open area which is small and superficial. I'm not sure I would put an Unna boot on this lady's degree of arterial insufficiency. 04/16/15; her original small wound appears to be healthy with good granulation her measurements are improved. She has a small area underneath this. Both underwent selective debridement since surface slough 04/23/15; the original wound has filled in there is healthy granulation here. The  small area underneath this is healed. No debridement was required [name] ([id number]) 04/30/15; the wound no longer has any depth. no Debridement required 05/07/15; the wound appears to be stable. Advancing epithelialization. Decrease wound volume. No debridement is required. She has known arterial disease status post stent placement to the SFA and popliteal artery. 05/14/15 unfortunately the wound is not really decreased much this week she still has a rim of epithelialization. The wound bed looks stable nevertheless I did a surgical debridement and there is some adherent surface slough. She has known PAD however the blood supply appears to be adequate status post stent to the SFA and popliteal and popliteal artery 05/21/15 wound is healthier than last week. No debridement is necessary. Known PAD status post stent to the SFA and popliteal artery 05/28/15; wound is no different from last week and in fact looks "stalled". Fairly we put Iodoflex on him last week however home health used Prisma 06/04/15 if anything the wound area appears to be larger. 06/11/15 switch to silver alginate last week and this seems to have helped. There is a thin layer of epithelialization only visible under the light 06/18/15; the patient had her leg traumatized when a grandchild fell over her leg at a party last weekend. Her original wound is just about closed very tiny however she has a new larger superficial wound just distal to the original wound. No evidence of infection 06/25/15; both of the wounds on her left leg including the chronic wound we have been treating her for a prolonged period of time now, and the traumatic wound from last week both are fully epithelialized. She apparently had a slip this week and managed to lift her nail off her right great toe. 07/02/15; patient's legs are carefully inspected. Her original wound on the left medial leg is completely healed. Traumatic area on her right great toenail  from last week is also healed. She has combined venous and arterial insufficiency we have obtained her juxtalite stockings bilaterally. 07/16/15 this is a patient we recently discharged. She states that the juxtalite stocking cause irritation and blistering on her anterior left leg. She is not therefore  used them in the last 6 days 07/23/15; once again the patient has no open area here. Her edema is well controlled. She is very reluctant to try the juxtalite's again because she attributes this to the blistering on her left anterior leg. Both the medial original wound, and the blister on the anterior left leg give healed === 05/19/16- the patient is here for initial evaluation of her left lower extremity ulcers and a burn to the left forearm. She states that upon discharge last year she was given juxta light compression wraps, she could not tolerate those and was given compression stockings which she recently converted to compression socks. She states that the compression socks are "medium" and is unable to articulate the exact millimeters of mercury. She states that the left lower extremity developed spontaneous blisters which eventually ulcerated 2 weeks ago. She has not been wearing compression stockings or socks to either extremity since this occurred. She states that she burned her left forearm on a hot pan while cooking for [employer]. She has been applying no topical agents to either. She is a diabetic, she is unaware of her recent A1c but states her morning glucose was 197. She has a follow-up with her PCP, Dr.Jadali this Friday. She has known mixed arterial and venous disease. Electronic Signature(s) Signed: 05/19/2016 5:43:59 PM By: Bonnell Public Previous Signature: 05/19/2016 5:42:55 PM Version By: Bonnell Public Entered By: Bonnell Public on 05/19/2016 17:43:59 Geremia, [name] ([id number]) -------------------------------------------------------------------------------- Physical Exam  Details Patient Name: [name] Date of Service: 05/19/2016 1:15 PM Medical Record Number: [id number] Patient Account Number: [id number] Date of Birth/Sex: 1945/04/24 (70 y.o. Female) Treating RN: Curtis Sites Primary Care Provider: Sherrie Mustache Other Clinician: Referring Provider: Sherrie Mustache Treating Provider/Extender: Kathreen Cosier in Treatment: 0 Constitutional BP within normal limits. afebrile. well nourished; well developed; appears stated age;Marland Kitchen Respiratory non-labored respiratory effort. clear to all fields. Cardiovascular LLE- tight, pitting edema, warm to touch, cap refill <3 sec. Psychiatric appears to make sound judgement and have accurate insight regarding healthcare. oriented to time, place, person and situation. calm, pleasant, conversive. Electronic Signature(s) Signed: 05/19/2016 5:44:42 PM By: Bonnell Public Entered By: Bonnell Public on 05/19/2016 17:44:42 Mcshan, [name] ([id number]) -------------------------------------------------------------------------------- Physician Orders Details Patient Name: [name] Date of Service: 05/19/2016 1:15 PM Medical Record Number: [id number] Patient Account Number: [id number] Date of Birth/Sex: 1945-09-01 (70 y.o. Female) Treating RN: Curtis Sites Primary Care Provider: Sherrie Mustache Other Clinician: Referring Provider: Sherrie Mustache Treating Provider/Extender: Kathreen Cosier in Treatment: 0 Verbal / Phone Orders: No Diagnosis Coding Wound Cleansing Wound #7 Left,Medial Lower Leg o Clean wound with Normal Saline. o Cleanse wound with mild soap and water Wound #8 Left Forearm o Clean wound with Normal Saline. o Cleanse wound with mild soap and water Anesthetic Wound #7 Left,Medial Lower Leg o Topical Lidocaine 4% cream applied to wound bed prior to debridement Wound #8 Left Forearm o Topical Lidocaine 4% cream applied to wound bed prior to debridement Primary Wound Dressing Wound  #8 Left Forearm o Hydrogel Wound #7 Left,Medial Lower Leg o Prisma Ag Secondary Dressing Wound #8 Left Forearm o Kerlix and Coban - wrap lightly o Non-adherent pad Wound #7 Left,Medial Lower Leg o ABD pad Dressing Change Frequency Wound #8 Left Forearm o Change dressing every day. Wound #7 Left,Medial Lower Leg Ancona, Manvi N. ([id number]) o Change dressing every week Follow-up Appointments Wound #7 Left,Medial Lower Leg o Return Appointment in 1 week. o Nurse Visit as needed Wound #  8 Left Forearm o Return Appointment in 1 week. o Nurse Visit as needed Edema Control Wound #7 Left,Medial Lower Leg o 3 Layer Compression System - Left Lower Extremity o Patient to wear own compression stockings - on right leg Additional Orders / Instructions Wound #7 Left,Medial Lower Leg o Increase protein intake. o Other: - please try to keep blood sugars below 180 Wound #8 Left Forearm o Increase protein intake. o Other: - please try to keep blood sugars below 180 Electronic Signature(s) Signed: 05/19/2016 5:48:19 PM By: Bonnell Public Signed: 05/19/2016 5:52:35 PM By: Curtis Sites Entered By: Curtis Sites on 05/19/2016 14:46:20 Alanis, [name] ([id number]) -------------------------------------------------------------------------------- Problem List Details Patient Name: [name] Date of Service: 05/19/2016 1:15 PM Medical Record Number: [id number] Patient Account Number: [id number] Date of Birth/Sex: 02-14-1946 (71 y.o. 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/19/2016 Yes with fat layer exposed T22.032A Burn of unspecified degree of left upper arm, initial 05/19/2016 Yes encounter Inactive Problems Resolved Problems Electronic Signature(s) Signed: 05/19/2016 5:36:16 PM By: Bonnell Public Previous Signature: 05/19/2016 5:31:02 PM Version By: Bonnell Public Previous Signature: 05/19/2016 5:30:33 PM Version By: Bonnell Public Entered By: Bonnell Public on 05/19/2016 17:36:16 [place], [name] ([id number]) -------------------------------------------------------------------------------- Progress Note/History and Physical Details Patient Name: [name] Date of Service: 05/19/2016 1:15 PM Medical Record Number: [id number] Patient Account Number: [id number] Date of Birth/Sex: 18-Jun-1945 (70 y.o. Female) Treating RN: Curtis Sites Primary Care Provider: Sherrie Mustache Other Clinician: Referring Provider: Sherrie Mustache Treating Provider/Extender: Kathreen Cosier in Treatment: 0 Subjective Chief Complaint Information obtained from Patient patient is here for initial evaluation of left lower extremity ulcers and a burn to the left forearm History of Present Illness (HPI) The following HPI elements were documented for the patient's wound: Location: left lower extremity and left forearm Duration: left lower extremity ulcers have been present for approximately 2 weeks (blisters formed approx 3 weeks ago) and the left forearm for less than 1 week Context: the left lower extremity ulcers are secondary to edema and spontaneous blister formation; the left forearm is secondary to a burn Pleasant 71 year old with diabetes (no hemoglobin A1c available) and severe peripheral vascular disease. She reports a prior left lower extremity stent. Records unavailable. Underwent multiple level angioplasty of left lower extremity 10/29/2014 by Dr. Wyn Quaker. Developed left calf ulcerations in late December  2016. Denies any trauma. Seen by her PCP. Bactrim prescribed. Referred to the wound clinic. Biopsy culture 02/20/2015 grew methicillin sensitive staph aureus, sensitive to Bactrim. Performing dressing changes with Prisma. Using a Tubigrip for edema control. Scheduled to see Dr. Wyn Quaker in follow-up 02/25/2015. Cancelled because of weather. Rescheduled for 03/06/2015. She returns to clinic for follow-up and is without new complaints. Pain improved. No ischemic rest pain. No fever or chills. Minimal drainage. 03/13/2015 -- records obtained from  [facility] shows that she was seen by Dr. dew on 03/12/2015 and given tramadol and other symptomatic treatment. Recent ABI done showed right to be 0.94 and left to be 0.47 a left lower extremity arterial duplex was notable for an occluded stent and distal ATA. Recommendations were for left lower extremity angiogram  with intervention. on 03/11/2015 she was taken up for a aortogram with angioplasty of the left posterior tibial artery and tibioperoneal trunk, above-knee popliteal artery and almost entire SFA, stent placement to the SFA and popliteal artery. 03/20/15 her arterial interventions noted. The patient states her left leg feels better. He ischemic wound on her left anterior leg. She has been using Santyl 04/09/15; the patient arrives back today. 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 dew. I was concerned about her last week as she had weeping edema fluid and areas of threatened ulceration around her wound. I ordered a silver alginate, Profore light wrap. She comes back today with an Radio broadcast assistant on apparently applied by Dr. Driscilla Grammes office. Her edema is under much better control and the weeping area here last week surrounding her major wound has resolved. She does have Jacober, Cherye N. ([id number]) another open area which is small and superficial. I'm not sure I would put an Unna boot on this  lady's degree of arterial insufficiency. 04/16/15; her original small wound appears to be healthy with good granulation her measurements are improved. She has a small area underneath this. Both underwent selective debridement since surface slough 04/23/15; the original wound has filled in there is healthy granulation here. The small area underneath this is healed. No debridement was required 04/30/15; the wound no longer has any depth. no Debridement required 05/07/15; the wound appears to be stable. Advancing epithelialization. Decrease wound volume. No debridement is required. She has known arterial disease status post stent placement to the SFA and popliteal artery. 05/14/15 unfortunately the wound is not really decreased much this week she still has a rim of epithelialization. The wound bed looks stable nevertheless I did a surgical debridement and there is some adherent surface slough. She has known PAD however the blood supply appears to be adequate status post stent to the SFA and popliteal and popliteal artery 05/21/15 wound is healthier than last week. No debridement is necessary. Known PAD status post stent to the SFA and popliteal artery 05/28/15; wound is no different from last week and in fact looks "stalled". Fairly we put Iodoflex on him last week however home health used Prisma 06/04/15 if anything the wound area appears to be larger. 06/11/15 switch to silver alginate last week and this seems to have helped. There is a thin layer of epithelialization only visible under the light 06/18/15; the patient had her leg traumatized when a grandchild fell over her leg at a party last weekend. Her original wound is just about closed very tiny however she has a new larger superficial wound just distal to the original wound. No evidence of infection 06/25/15; both of the wounds on her left leg including the chronic wound we have been treating her for a prolonged period of time now, and the traumatic  wound from last week both are fully epithelialized. She apparently had a slip this week and managed to lift her nail off her right great toe. 07/02/15; patient's legs are carefully inspected. Her original wound on the left medial leg is completely healed. Traumatic area on her right great toenail from last week is also healed. She has combined venous and arterial insufficiency we have obtained her juxtalite stockings bilaterally. 07/16/15 this is a patient we recently discharged. She states that the juxtalite stocking cause irritation and blistering on her anterior left leg. She is not therefore used them in the last 6 days 07/23/15; once again the patient  has no open area here. Her edema is well controlled. She is very reluctant to try the juxtalite's again because she attributes this to the blistering on her left anterior leg. Both the medial original wound, and the blister on the anterior left leg give healed === 05/19/16- the patient is here for initial evaluation of her left lower extremity ulcers and a burn to the left forearm. She states that upon discharge last year she was given juxta light compression wraps, she could not tolerate those and was given compression stockings which she recently converted to compression socks. She states that the compression socks are "medium" and is unable to articulate the exact millimeters of mercury. She states that the left lower extremity developed spontaneous blisters which eventually ulcerated 2 weeks ago. She has not been wearing compression stockings or socks to either extremity since this occurred. She states that she burned her left forearm on a hot pan while cooking for [employer]. She has been applying no topical agents to either. She is a diabetic, she is unaware of her recent A1c but states her morning glucose was 197. She has a follow-up with her PCP, Dr.Jadali this Friday. She has known mixed arterial and venous disease. Wound History CHRISTIANA, GUREVICH ([id number]) Patient presents with 1 open wound that has been present for approximately 2 weeks. Patient has been treating wound in the following manner: cleaning and open to air. Laboratory tests have not been performed in the last month. Patient reportedly has not tested positive for an antibiotic resistant organism. Patient reportedly has not tested positive for osteomyelitis. Patient reportedly has had testing performed to evaluate circulation in the legs. Patient experiences the following problems associated with their wounds: swelling. Patient History Information obtained from Patient. Allergies codeine, penicillin, Iodinated Contrast Media - IV Dye Social History Never smoker, Marital Status - Married, Alcohol Use - Never, Drug Use - No History, Caffeine Use - Never. Medical History Eyes Denies history of Cataracts, Glaucoma, Optic Neuritis Ear/Nose/Mouth/Throat Denies history of Chronic sinus problems/congestion, Middle ear problems Hematologic/Lymphatic Denies history of Anemia, Hemophilia, Human Immunodeficiency Virus, Lymphedema, Sickle Cell Disease Respiratory Denies history of Aspiration, Asthma, Chronic Obstructive Pulmonary Disease (COPD), Pneumothorax, Sleep Apnea, Tuberculosis Cardiovascular Patient has history of Hypertension, Peripheral Arterial Disease Denies history of Peripheral Venous Disease Gastrointestinal Denies history of Cirrhosis , Colitis, Crohn s, Hepatitis A, Hepatitis B, Hepatitis C Endocrine Patient has history of Type II Diabetes Genitourinary Denies history of End Stage Renal Disease Immunological Denies history of Lupus Erythematosus, Raynaud s, Scleroderma Musculoskeletal Patient has history of Osteoarthritis Denies history of Gout, Rheumatoid Arthritis, Osteomyelitis Neurologic Patient has history of Neuropathy Denies history of Dementia, Quadriplegia, Paraplegia, Seizure Disorder Oncologic Denies history of Received Chemotherapy,  Received Radiation Patient is treated with Oral Agents. Blood sugar is tested. Review of Systems (ROS) Constitutional Symptoms (General Health) The patient has no complaints or symptoms. CHARLEIGH, CORRENTI ([id number]) Eyes The patient has no complaints or symptoms. Ear/Nose/Mouth/Throat The patient has no complaints or symptoms. Hematologic/Lymphatic The patient has no complaints or symptoms. Respiratory The patient has no complaints or symptoms. Genitourinary Denies complaints or symptoms of Kidney failure/ Dialysis. Integumentary (Skin) Complains or has symptoms of Wounds - left lower leg. Objective Constitutional BP within normal limits. afebrile. well nourished; well developed; appears stated age;Marland Kitchen Vitals Time Taken: 1:46 PM, Height: 68 in, Source: Measured, Weight: 300 lbs, Source: Measured, BMI: 45.6, Temperature: 98.2 F, Pulse: 74 bpm, Respiratory Rate: 20 breaths/min, Blood Pressure: 116/85 mmHg. Respiratory non-labored  respiratory effort. clear to all fields. Cardiovascular LLE- tight, pitting edema, warm to touch, cap refill Psychiatric appears to make sound judgement and have accurate insight regarding healthcare. oriented to time, place, person and situation. calm, pleasant, conversive. Integumentary (Hair, Skin) Wound #7 status is Open. Original cause of wound was Blister. The wound is located on the Left,Medial Lower Leg. The wound measures 7cm length x 6.5cm width x 0.1cm depth; 35.736cm^2 area and 3.574cm^3 volume. The wound is limited to skin breakdown. There is no tunneling or undermining noted. There is a large amount of serous drainage noted. The wound margin is flat and intact. There is medium (34-66%) red granulation within the wound bed. There is a medium (34-66%) amount of necrotic tissue within the wound bed including Eschar and Adherent Slough. The periwound skin appearance did not exhibit: Callus, Crepitus, Excoriation, Induration, Rash, Scarring,  Dry/Scaly, Maceration, Atrophie Blanche, Cyanosis, Ecchymosis, Hemosiderin Staining, Mottled, Pallor, Rubor, Erythema. Periwound temperature was noted as No Abnormality. The periwound has tenderness on palpation. JUSTINE, DINES ([id number]) Wound #8 status is Open. Original cause of wound was Thermal Burn. The wound is located on the Left Forearm. The wound measures 2.5cm length x 1.4cm width x 0.2cm depth; 2.749cm^2 area and 0.55cm^3 volume. The wound is limited to skin breakdown. There is no tunneling or undermining noted. There is a medium amount of serous drainage noted. The wound margin is flat and intact. There is small (1-33%) pink granulation within the wound bed. There is a large (67-100%) amount of necrotic tissue within the wound bed including Eschar and Adherent Slough. The periwound skin appearance did not exhibit: Callus, Crepitus, Excoriation, Induration, Rash, Scarring, Dry/Scaly, Maceration, Atrophie Blanche, Cyanosis, Ecchymosis, Hemosiderin Staining, Mottled, Pallor, Rubor, Erythema. Periwound temperature was noted as No Abnormality. 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 There was a Non-Viable Tissue Open Wound/Selective [phone]) debridement with total area of 0.25 sq cm performed by Bonnell Public, NP. with the following instrument(s): Curette to remove Non-Viable  tissue/material including Fibrin/Slough after achieving pain control using Lidocaine 4% Topical Solution. A time out was conducted at 14:36, prior to the start of the procedure. There was no bleeding. The procedure was tolerated well with a pain level of 0 throughout and a pain level of 0 following the procedure. Post Debridement Measurements: 2.5cm length x 1.4cm width x 0.2cm depth; 0.55cm^3 volume. Character of Wound/Ulcer Post Debridement is improved. Severity of Tissue Post Debridement is: Limited to breakdown of skin. Post procedure Diagnosis Wound #8: Same as Pre-Procedure Pilger, Armoni N. ([id number]) Plan Wound Cleansing: Wound #7 Left,Medial Lower Leg: Clean wound with Normal Saline. Cleanse wound with mild soap and water Wound #8 Left Forearm: Clean wound with Normal Saline. Cleanse wound with mild soap and water Anesthetic: Wound #7 Left,Medial Lower Leg: Topical Lidocaine 4% cream applied to wound bed prior to debridement Wound #8 Left Forearm: Topical Lidocaine 4% cream applied to wound  bed prior to debridement Primary Wound Dressing: Wound #8 Left Forearm: Hydrogel Wound #7 Left,Medial Lower Leg: Prisma Ag Secondary Dressing: Wound #8 Left Forearm: Kerlix and Coban - wrap lightly Non-adherent pad Wound #7 Left,Medial Lower Leg: ABD pad Dressing Change Frequency: Wound #8 Left Forearm: Change dressing every day. Wound #7 Left,Medial Lower Leg: Change dressing every week Follow-up Appointments: Wound #7 Left,Medial Lower Leg: Return Appointment in 1 week. Nurse Visit as needed Wound #8 Left Forearm: Return Appointment in 1 week. Nurse Visit as needed Edema Control: Wound #7 Left,Medial Lower Leg: 3 Layer Compression System - Left Lower Extremity Patient to wear own compression stockings - on right leg Additional Orders / Instructions: Wound #7 Left,Medial Lower Leg: Increase protein intake. Other: - please try to keep blood sugars below 180 Wound #8 Left  Forearm: Increase protein intake. ZAYNAB, CHIPMAN ([id number]) Other: - please try to keep blood sugars below 180 1. Hydrogel to the left forearm daily 2. Silver collagen, 3 layer compression, change weekly 3. Follow-up next week Electronic Signature(s) Signed: 05/19/2016 5:46:20 PM By: Bonnell Public Entered By: Bonnell Public on 05/19/2016 17:46:20 Losada, [name] ([id number]) -------------------------------------------------------------------------------- ROS/PFSH Details Patient Name: [name] Date of Service: 05/19/2016 1:15 PM Medical Record Number: [id number] Patient Account Number: [id number] Date of Birth/Sex: Dec 22, 1945 (70 y.o. Female) Treating RN: Curtis Sites Primary Care Provider: Sherrie Mustache Other Clinician: Referring Provider: Sherrie Mustache Treating Provider/Extender: Kathreen Cosier in Treatment: 0 Label Progress Note Print Version as History and Physical for this encounter Information Obtained From Patient Wound History Do you currently have one or more open woundso Yes How many open wounds do you currently haveo 1 Approximately how long have you had your woundso 2 weeks How have you been treating your wound(s) until nowo cleaning and open to air Has your wound(s) ever healed and then re-openedo No Have you had any lab work done in the past montho No Have you tested positive for an antibiotic resistant organism (MRSA, No VRE)o Have you tested positive for osteomyelitis (bone infection)o No Have you had any tests for circulation on your legso Yes Who ordered the testo [facility] Where was the test doneo [facility] Have you had other problems associated with your woundso Swelling Genitourinary Complaints and Symptoms: Negative for: Kidney failure/ Dialysis Medical History: Negative for: End Stage Renal Disease Integumentary (Skin) Complaints and Symptoms: Positive for: Wounds - left lower leg Constitutional Symptoms (General Health) Complaints and  Symptoms: No Complaints or Symptoms Eyes Complaints and Symptoms: No Complaints or Symptoms Rodrigue, Jaydee N. ([id number]) Medical History: Negative for: Cataracts; Glaucoma; Optic Neuritis Ear/Nose/Mouth/Throat Complaints and Symptoms: No Complaints or Symptoms Medical History: Negative for: Chronic sinus problems/congestion; Middle ear problems Hematologic/Lymphatic Complaints and Symptoms: No Complaints or Symptoms Medical History: Negative for: Anemia; Hemophilia; Human Immunodeficiency Virus; Lymphedema; Sickle Cell Disease Respiratory Complaints and Symptoms: No Complaints or Symptoms Medical History: Negative for: Aspiration; Asthma; Chronic Obstructive Pulmonary Disease (COPD); Pneumothorax; Sleep Apnea; Tuberculosis Cardiovascular Medical History: Positive for: Hypertension; Peripheral Arterial Disease Negative for: Peripheral Venous Disease Gastrointestinal Medical History: Negative for: Cirrhosis ; Colitis; Crohnos; Hepatitis A; Hepatitis B; Hepatitis C Endocrine Medical History: Positive for: Type II Diabetes Time with diabetes: 10 years Treated with: Oral agents Blood sugar tested every day: Yes Tested : qd Immunological RISHIKA, MCCOLLOM ([id number]) Medical History: Negative for: Lupus Erythematosus; Raynaudos; Scleroderma Musculoskeletal Medical History: Positive for: Osteoarthritis Negative for: Gout; Rheumatoid Arthritis; Osteomyelitis Neurologic Medical History: Positive for: Neuropathy Negative for: Dementia; Quadriplegia; Paraplegia; Seizure  Disorder Oncologic Medical History: Negative for: Received Chemotherapy; Received Radiation Immunizations Pneumococcal Vaccine: Received Pneumococcal Vaccination: Yes Family and Social History Never smoker; Marital Status - Married; Alcohol Use: Never; Drug Use: No History; Caffeine Use: Never; Financial Concerns: No; Food, Clothing or Shelter Needs: No; Support System Lacking: No; Transportation Concerns:  No; Advanced Directives: No; Patient does not want information on Advanced Directives Electronic Signature(s) Signed: 05/19/2016 5:48:19 PM By: Bonnell Public Signed: 05/19/2016 5:52:35 PM By: Curtis Sites Entered By: Curtis Sites on 05/19/2016 13:50:28 Romanek, [name] ([id number]) -------------------------------------------------------------------------------- SuperBill Details Patient Name: [name] Date of Service: 05/19/2016 Medical Record Number: [id number] Patient Account Number: [id number] Date of Birth/Sex: 1945/03/27 (71 y.o. Female) Treating RN: Curtis Sites Primary Care Provider: Sherrie Mustache Other Clinician: Referring Provider: Sherrie Mustache Treating Provider/Extender: Kathreen Cosier in Treatment: 0 Diagnosis Coding ICD-10 Codes Code Description I87.312 Chronic venous hypertension (idiopathic) with ulcer of left lower extremity E11.622 Type 2 diabetes mellitus with other skin ulcer L97.822 Non-pressure chronic ulcer of other part of left lower leg with fat layer exposed T22.032A Burn of unspecified degree of left upper arm, initial encounter Facility Procedures CPT4 Code: 78295621 Description: 99213 - WOUND CARE VISIT-LEV 3 EST PT Modifier: Quantity: 1 CPT4 Code: 30865784 Description: 97597 - DEBRIDE WOUND 1ST 20 SQ CM OR < ICD-10 Description Diagnosis T22.032A Burn of unspecified degree of left upper arm, initi Modifier: al encounter Quantity: 1 Physician Procedures CPT4: Description Modifier Quantity Code [id number] 99213 - WC PHYS LEVEL 3 - EST PT 1 ICD-10 Description Diagnosis I87.312 Chronic venous hypertension (idiopathic) with ulcer of left lower extremity E11.622 Type 2 diabetes mellitus with other skin ulcer  L97.822 Non-pressure chronic ulcer of other part of left lower leg with fat layer exposed CPT4: 8413244 97597 - WC PHYS DEBR WO ANESTH 20 SQ CM 1 ICD-10 Description Diagnosis T22.032A Burn of unspecified degree of left upper arm, initial  encounter Electronic Signature(s) BRIHANY, BUTCH ([id number]) Signed: 05/19/2016 5:47:47 PM By: Bonnell Public Entered By: Bonnell Public on 05/19/2016 17:47:47

## 2016-05-20 NOTE — Progress Notes (Signed)
DEONDREA, AGUADO ([id number]) Visit Report for 05/19/2016 Abuse/Suicide Risk Screen Details Patient Name: [name], [name] Date of Service: 05/19/2016 1:15 PM Medical Record Number: [id number] Patient Account Number: [id number] Date of Birth/Sex: [birth year] (71 y.o. Female) Treating RN: Curtis Sites Primary Care Caiden Arteaga: Sherrie Mustache Other Clinician: Referring Mindee Robledo: Sherrie Mustache Treating Laticia Vannostrand/Extender: Kathreen Cosier in Treatment: 0 Abuse/Suicide Risk Screen Items Answer ABUSE/SUICIDE RISK SCREEN: Has anyone close to you tried to hurt or harm you recentlyo No Do you feel uncomfortable with anyone in your familyo No Has anyone forced you do things that you didnot want to doo No Do you have any thoughts of harming yourselfo No Patient displays signs or symptoms of abuse and/or neglect. No Electronic Signature(s) Signed: 05/19/2016 5:52:35 PM By: Curtis Sites Entered By: Curtis Sites on 05/19/2016 13:50:39 Walbert, [name] ([id number]) -------------------------------------------------------------------------------- Activities of Daily Living Details Patient Name: [name] Date of Service: 05/19/2016 1:15 PM Medical Record Number: [id number] Patient Account Number: [id number] Date of Birth/Sex: May 09, 1945 (70 y.o. Female) Treating RN: Curtis Sites Primary Care Gillian Kluever: Sherrie Mustache Other Clinician: Referring Fernado Brigante: Sherrie Mustache Treating Mairin Lindsley/Extender: Kathreen Cosier in Treatment: 0 Activities of Daily Living Items Answer Activities of Daily Living (Please select one for each item) Drive Automobile Not Able Take Medications Completely Able Use Telephone Completely Able Care for Appearance Need Assistance Use Toilet Need Assistance Bath / Shower Need Assistance Dress Self Need Assistance Feed Self Completely Able Walk Need Assistance Get In / Out Bed Need Assistance Housework Need Assistance Prepare Meals Need Assistance Handle Money  Completely Able Shop for Self Need Assistance Electronic Signature(s) Signed: 05/19/2016 5:52:35 PM By: Curtis Sites Entered By: Curtis Sites on 05/19/2016 13:51:16 Rampey, [name] ([id number]) -------------------------------------------------------------------------------- Education Assessment Details Patient Name: [name] Date of Service: 05/19/2016 1:15 PM Medical Record Number: [id number] Patient Account Number: [id number] Date of Birth/Sex: December 07, 1945 (71 y.o. Female) Treating RN: Curtis Sites Primary Care Kyron Schlitt: Sherrie Mustache Other Clinician: Referring Camiyah Friberg: Sherrie Mustache Treating Jazzalynn Rhudy/Extender: Kathreen Cosier in Treatment: 0 Primary Learner Assessed: Caregiver patient needs HHRN if Reason Patient is not Primary Learner: legs are wrapped Learning Preferences/Education Level/Primary Language Learning Preference: Explanation, Demonstration Highest Education Level: High School Preferred Language: English Cognitive Barrier Assessment/Beliefs Language Barrier: No Translator Needed: No Memory Deficit: No Emotional Barrier: No Cultural/Religious Beliefs Affecting Medical No Care: Physical Barrier Assessment Impaired Vision: No Impaired Hearing: No Decreased Hand dexterity: No Knowledge/Comprehension Assessment Knowledge Level: Medium Comprehension Level: Medium Ability to understand written Medium instructions: Ability to understand verbal Medium instructions: Motivation Assessment Anxiety Level: Calm Cooperation: Cooperative Education Importance: Acknowledges Need Interest in Health Problems: Asks Questions Perception: Coherent Willingness to Engage in Self- Medium Management Activities: Readiness to Engage in Self- Medium Management Activities: ALLYNE, HEBERT ([id number]) Electronic Signature(s) Signed: 05/19/2016 5:52:35 PM By: Curtis Sites Entered By: Curtis Sites on 05/19/2016 13:52:02 MAYO, OWCZARZAK  ([id number]) -------------------------------------------------------------------------------- Fall Risk Assessment Details Patient Name: [name] Date of Service: 05/19/2016 1:15 PM Medical Record Number: [id number] Patient Account Number: [id number] Date of Birth/Sex: April 04, 1945 (71 y.o. Female) Treating RN: Curtis Sites Primary Care Chyann Ambrocio: Sherrie Mustache Other Clinician: Referring Oronde Hallenbeck: Sherrie Mustache Treating Juanya Villavicencio/Extender: Kathreen Cosier in Treatment: 0 Fall Risk Assessment Items Have you had 2 or more falls in the last 12 monthso 0 No Have you had any fall that resulted in injury in the last 12 monthso 0 No FALL RISK ASSESSMENT: History of falling - immediate or within 3 months 0 No Secondary diagnosis  0 No Ambulatory aid None/bed rest/wheelchair/nurse 0 No Crutches/cane/walker 15 Yes Furniture 0 No IV Access/Saline Lock 0 No Gait/Training Normal/bed rest/immobile 0 No Weak 10 Yes Impaired 0 No Mental Status Oriented to own ability 0 Yes Electronic Signature(s) Signed: 05/19/2016 5:52:35 PM By: Curtis Sites Entered By: Curtis Sites on 05/19/2016 13:52:15 Voight, [name] ([id number]) -------------------------------------------------------------------------------- Foot Assessment Details Patient Name: [name] Date of Service: 05/19/2016 1:15 PM Medical Record Number: [id number] Patient Account Number: [id number] Date of Birth/Sex: 11-Jan-1946 (70 y.o. Female) Treating RN: Curtis Sites Primary Care Davene Jobin: Sherrie Mustache Other Clinician: Referring Ovetta Bazzano: Sherrie Mustache Treating Taleah Bellantoni/Extender: Kathreen Cosier in Treatment: 0 Foot Assessment Items Site Locations + = Sensation present, - = Sensation absent, C = Callus, U = Ulcer R = Redness, W = Warmth, M = Maceration, PU = Pre-ulcerative lesion F = Fissure, S = Swelling, D = Dryness Assessment Right: Left: Other Deformity: No No Prior Foot Ulcer: No No Prior Amputation: No  No Charcot Joint: No No Ambulatory Status: Non-ambulatory Assistance Device: Wheelchair Gait: Steady Electronic Signature(s) Signed: 05/19/2016 5:52:35 PM By: Curtis Sites Entered By: Curtis Sites on 05/19/2016 13:58:10 Bayles, [name] ([id number]) -------------------------------------------------------------------------------- Nutrition Risk Assessment Details Patient Name: [name] Date of Service: 05/19/2016 1:15 PM Medical Record Number: [id number] Patient Account Number: [id number] Date of Birth/Sex: December 19, 1945 (70 y.o. Female) Treating RN: Curtis Sites Primary Care Jewels Langone: Sherrie Mustache Other Clinician: Referring Kenndra Morris: Sherrie Mustache Treating Avarie Tavano/Extender: Kathreen Cosier in Treatment: 0 Height (in): 68 Weight (lbs): 300 Body Mass Index (BMI): 45.6 Nutrition Risk Assessment Items NUTRITION RISK SCREEN: I have an illness or condition that made me change the kind and/or 0 No amount of food I eat I eat fewer than two meals per day 0 No I eat few fruits and vegetables, or milk products 0 No I have three or more drinks of beer, liquor or wine almost every day 0 No I have tooth or mouth problems that make it hard for me to eat 0 No I don't always have enough money to buy the food I need 0 No I eat alone most of the time 0 No I take three or more different prescribed or over-the-counter drugs a 1 Yes day Without wanting to, I have lost or gained 10 pounds in the last six 0 No months I am not always physically able to shop, cook and/or feed myself 0 No Nutrition Protocols Good Risk Protocol 0 No interventions needed Moderate Risk Protocol Electronic Signature(s) Signed: 05/19/2016 5:52:35 PM By: Curtis Sites Entered By: Curtis Sites on 05/19/2016 13:52:21

## 2016-05-20 NOTE — Progress Notes (Signed)
JAMETTA, MOOREHEAD ([id number]) Visit Report for 05/19/2016 Allergy List Details Patient Name: [name], [name] Date of Service: 05/19/2016 1:15 PM Medical Record Number: [id number] Patient Account Number: [id number] Date of Birth/Sex: 09/07/1945 (71 y.o. Female) Treating RN: Curtis Sites Primary Care Curtisha Bendix: Sherrie Mustache Other Clinician: Referring Tryton Bodi: Sherrie Mustache Treating Keiden Deskin/Extender: Bonnell Public Weeks in Treatment: 0 Allergies Active Allergies codeine penicillin Iodinated Contrast Media - IV Dye Allergy Notes Electronic Signature(s) Signed: 05/19/2016 5:52:35 PM By: Curtis Sites Entered By: Curtis Sites on 05/19/2016 13:47:32 Barwick, [name] ([id number]) -------------------------------------------------------------------------------- Arrival Information Details Patient Name: [name] Date of Service: 05/19/2016 1:15 PM Medical Record Number: [id number] Patient Account Number: [id number] Date of Birth/Sex: 11-Mar-1945 (71 y.o. Female) Treating RN: Curtis Sites Primary Care Tabitha Tupper: Sherrie Mustache Other Clinician: Referring Kinjal Neitzke: Sherrie Mustache Treating Dedee Liss/Extender: Kathreen Cosier in Treatment: 0 Visit Information Patient Arrived: Wheel Chair Arrival Time: 13:42 Accompanied By: spouse Transfer Assistance: None Patient Identification Verified: Yes Secondary Verification Process Yes Completed: Patient Has Alerts: Yes Patient Alerts: aspirin 81, plavix DMII History Since Last Visit Added or deleted any medications: No Any new allergies or adverse reactions: No Had a fall or experienced change in activities of daily living that may affect risk of falls: No Signs or symptoms of abuse/neglect since last visito No Hospitalized since last visit: No Electronic Signature(s) Signed: 05/19/2016 5:52:35 PM By: Curtis Sites Entered By: Curtis Sites on 05/19/2016 13:43:30 [name], [name]  ([id number]) -------------------------------------------------------------------------------- Clinic Level of Care Assessment Details Patient Name: [name] Date of Service: 05/19/2016 1:15 PM Medical Record Number: [id number] Patient Account Number: [id number] Date of Birth/Sex: [birth year] (70 y.o. Female) Treating RN: Curtis Sites Primary Care Jarae Panas: Sherrie Mustache Other Clinician: Referring Casia Corti: Sherrie Mustache Treating Alexanderjames Berg/Extender: Kathreen Cosier in Treatment: 0 Clinic Level of Care Assessment Items TOOL 1 Quantity Score  - Use when EandM and Procedure is performed on INITIAL visit 0 ASSESSMENTS - Nursing Assessment / Reassessment X - General Physical Exam (combine w/ comprehensive assessment (listed just 1 20 below) when performed on new pt. evals) X - Comprehensive Assessment (HX, ROS, Risk Assessments, Wounds Hx, etc.) 1 25 ASSESSMENTS - Wound and Skin Assessment / Reassessment  - Dermatologic / Skin Assessment (not related to wound area) 0 ASSESSMENTS - Ostomy and/or Continence Assessment and Care  - Incontinence Assessment and Management 0  - Ostomy Care Assessment and Management (repouching, etc.) 0 PROCESS - Coordination of Care X - Simple Patient / Family Education for ongoing care 1 15  - Complex (extensive) Patient / Family Education for ongoing care 0 X - Staff obtains Chiropractor, Records, Test Results / Process Orders 1 10  - Staff telephones HHA, Nursing Homes / Clarify orders / etc 0  - Routine Transfer to another Facility (non-emergent condition) 0  - Routine Hospital Admission (non-emergent condition) 0 X - New Admissions / Manufacturing engineer / Ordering NPWT, Apligraf, etc. 1 15  - Emergency Hospital Admission (emergent condition) 0 PROCESS - Special Needs  - Pediatric / Minor Patient Management 0  - Isolation Patient Management 0 [name], [name] ([id number])  - Hearing / Language / Visual special needs 0  -  Assessment of Community assistance (transportation, D/C planning, etc.) 0  - Additional assistance / Altered mentation 0  - Support Surface(s) Assessment (bed, cushion, seat, etc.) 0 INTERVENTIONS - Miscellaneous  - External ear exam 0  - Patient Transfer (multiple staff / Nurse, adult / Similar devices) 0  - Simple Staple / Suture removal (25  or less) 0  - Complex Staple / Suture removal (26 or more) 0  - Hypo/Hyperglycemic Management (do not check if billed separately) 0 X - Ankle / Brachial Index (ABI) - do not check if billed separately 1 15 Has the patient been seen at the hospital within the last three years: Yes Total Score: 100 Level Of Care: New/Established - Level 3 Electronic Signature(s) Signed: 05/19/2016 5:52:35 PM By: Curtis Sites Entered By: Curtis Sites on 05/19/2016 15:19:22 [name], [name] -------------------------------------------------------------------------------- Encounter Discharge Information Details Patient Name: [name] Date of Service: 05/19/2016 1:15 PM Medical Record Number: [id number] Patient Account Number: [id number] Date of Birth/Sex: 1946-02-05 (71 y.o. Female) Treating RN: Curtis Sites Primary Care Junah Yam: Sherrie Mustache Other Clinician: Referring Latanga Nedrow: Sherrie Mustache Treating Tomy Khim/Extender: Kathreen Cosier in Treatment: 0 Encounter Discharge Information Items Discharge Pain Level: 0 Discharge Condition: Stable Ambulatory Status: Wheelchair Discharge Destination: Home Transportation: Private Auto Accompanied By: son Schedule Follow-up Appointment: Yes Medication Reconciliation completed No and provided to Patient/Care Linwood Gullikson: Provided on Clinical Summary of Care: 05/19/2016 Form Type Recipient Paper Patient MW Electronic Signature(s) Signed: 05/19/2016 3:20:55 PM By: Curtis Sites Previous Signature: 05/19/2016 3:08:32 PM Version By: Gwenlyn Perking Entered By: Curtis Sites on 05/19/2016  15:20:55 [name], [name] ([id number]) -------------------------------------------------------------------------------- Lower Extremity Assessment Details Patient Name: [name] Date of Service: 05/19/2016 1:15 PM Medical Record Number: [id number] Patient Account Number: [id number] Date of Birth/Sex: 1945-10-09 (70 y.o. Female) Treating RN: Curtis Sites Primary Care Cletus Mehlhoff: Sherrie Mustache Other Clinician: Referring Braxen Dobek: Sherrie Mustache Treating Keirah Konitzer/Extender: Kathreen Cosier in Treatment: 0 Edema Assessment Assessed: [Left: No] [Right: No] Edema: [Left: Yes] [Right: Yes] Calf Left: Right: Point of Measurement: 32 cm From Medial Instep 40.7 cm 41.1 cm Ankle Left: Right: Point of Measurement: 12 cm From Medial Instep 26.3 cm 25.1 cm Vascular Assessment Pulses: Dorsalis Pedis Palpable: [Left:Yes] [Right:Yes] Doppler Audible: [Left:Yes] [Right:Yes] Posterior Tibial Palpable: [Left:No] [Right:No] Doppler Audible: [Left:Yes] [Right:Yes] Extremity colors, hair growth, and conditions: Extremity Color: [Left:Hyperpigmented] [Right:Hyperpigmented] Hair Growth on Extremity: [Left:No] [Right:No] Temperature of Extremity: [Left:Warm] [Right:Warm] Capillary Refill: [Left:< 3 seconds] [Right:< 3 seconds] Blood Pressure: Brachial: [Left:122] [Right:124] Dorsalis Pedis: 154 [Left:Dorsalis Pedis: 134] Ankle: Posterior Tibial: 148 [Left:Posterior Tibial: 136 1.24] [Right:1.10] Toe Nail Assessment Left: Right: Thick: Yes Yes Discolored: Yes Yes Deformed: No No Improper Length and Hygiene: Yes Yes [name], [name] ([id number]) Electronic Signature(s) Signed: 05/19/2016 5:52:35 PM By: Curtis Sites Entered By: Curtis Sites on 05/19/2016 14:14:27 [name], [name] ([id number]) -------------------------------------------------------------------------------- Multi Wound Chart Details Patient Name: [name] Date of Service: 05/19/2016 1:15 PM Medical Record Number:  [id number] Patient Account Number: [id number] Date of Birth/Sex: 12-Nov-1945 (71 y.o. Female) Treating RN: Curtis Sites Primary Care Caylea Foronda: Sherrie Mustache Other Clinician: Referring Brexley Cutshaw: Sherrie Mustache Treating Brielle Moro/Extender: Kathreen Cosier in Treatment: 0 Vital Signs Height(in): 68 Pulse(bpm): 74 Weight(lbs): 300 Blood Pressure 116/85 (mmHg): Body Mass Index(BMI): 46 Temperature(F): 98.2 Respiratory Rate 20 (breaths/min): Photos: [N/A:N/A] Wound Location: Left Lower Leg - Medial Left Forearm N/A Wounding Event: Blister Thermal Burn N/A Primary Etiology: Venous Leg Ulcer 2nd degree Burn N/A Comorbid History: Hypertension, Peripheral Hypertension, Peripheral N/A Arterial Disease, Type II Arterial Disease, Type II Diabetes, Osteoarthritis, Diabetes, Osteoarthritis, Neuropathy Neuropathy Date Acquired: 05/04/2016 05/11/2016 N/A Weeks of Treatment: 0 0 N/A Wound Status: Open Open N/A Clustered Wound: Yes No N/A Clustered Quantity: 5 N/A N/A Measurements L x W x D 7x6.5x0.1 2.5x1.4x0.2 N/A (cm) Area (cm) : 35.736 2.749 N/A Volume (cm) : 3.574 0.55  N/A % Reduction in Area: 0.00% N/A N/A % Reduction in Volume: 0.00% N/A N/A Classification: Partial Thickness Partial Thickness N/A HBO Classification: Grade 1 N/A N/A Exudate Amount: Large Medium N/A Exudate Type: Serous Serous N/A Exudate Color: amber amber N/A [name], EDLING. ([id number]) Wound Margin: Flat and Intact Flat and Intact N/A Granulation Amount: Medium (34-66%) Small (1-33%) N/A Granulation Quality: Red Pink N/A Necrotic Amount: Medium (34-66%) Large (67-100%) N/A Necrotic Tissue: Eschar, Adherent Slough Eschar, Adherent Slough N/A Exposed Structures: Fascia: No Fascia: No N/A Fat Layer (Subcutaneous Fat Layer (Subcutaneous Tissue) Exposed: No Tissue) Exposed: No Tendon: No Tendon: No Muscle: No Muscle: No Joint: No Joint: No Bone: No Bone: No Limited to Skin Limited to  Skin Breakdown Breakdown Epithelialization: None None N/A Debridement: N/A Open Wound/Selective N/A ([id number]) - Selective Pre-procedure N/A 14:36 N/A Verification/Time Out Taken: Pain Control: N/A Lidocaine 4% Topical N/A Solution Tissue Debrided: N/A Fibrin/Slough N/A Level: N/A Non-Viable Tissue N/A Debridement Area (sq N/A 3.5 N/A cm): Instrument: N/A Curette N/A Bleeding: N/A None N/A Procedural Pain: N/A 0 N/A Post Procedural Pain: N/A 0 N/A Debridement Treatment N/A Procedure was tolerated N/A Response: well Post Debridement N/A 2.5x1.4x0.2 N/A Measurements L x W x D (cm) Post Debridement N/A 0.55 N/A Volume: (cm) Periwound Skin Texture: Excoriation: No Excoriation: No N/A Induration: No Induration: No Callus: No Callus: No Crepitus: No Crepitus: No Rash: No Rash: No Scarring: No Scarring: No Periwound Skin Maceration: No Maceration: No N/A Moisture: Dry/Scaly: No Dry/Scaly: No Periwound Skin Color: Atrophie Blanche: No Atrophie Blanche: No N/A Cyanosis: No Cyanosis: No Ecchymosis: No Ecchymosis: No Erythema: No Erythema: No Hemosiderin Staining: No Hemosiderin Staining: No GERARD, BONUS. ([id number]) Mottled: No Mottled: No Pallor: No Pallor: No Rubor: No Rubor: No Temperature: No Abnormality No Abnormality N/A Tenderness on Yes No N/A Palpation: Wound Preparation: Ulcer Cleansing: Ulcer Cleansing: N/A Rinsed/Irrigated with Rinsed/Irrigated with Saline Saline Topical Anesthetic Topical Anesthetic Applied: Other: lidocaine Applied: Other: lidocaine 4% 4% Procedures Performed: N/A Debridement N/A Treatment Notes Wound #7 (Left, Medial Lower Leg) 1. Cleansed with: Clean wound with Normal Saline 2. Anesthetic Topical Lidocaine 4% cream to wound bed prior to debridement 4. Dressing Applied: Prisma Ag 5. Secondary Dressing Applied ABD Pad 7. Secured with 3 Layer Compression System - Left Lower Extremity Wound #8 (Left Forearm) 1.  Cleansed with: Clean wound with Normal Saline 2. Anesthetic Topical Lidocaine 4% cream to wound bed prior to debridement 4. Dressing Applied: Hydrogel 5. Secondary Dressing Applied Kerlix/Conform Non-Adherent pad 7. Secured with Tape Other (specify in notes) Notes secured lightly with coban Electronic Signature(s) Signed: 05/19/2016 5:36:28 PM By: Ermalinda Memos, [name] ([id number]) Entered By: Bonnell Public on 05/19/2016 17:36:28 [name], [name] ([id number]) -------------------------------------------------------------------------------- Multi-Disciplinary Care Plan Details Patient Name: [name] Date of Service: 05/19/2016 1:15 PM Medical Record Number: [id number] Patient Account Number: [id number] Date of Birth/Sex: 1945-02-25 (70 y.o. Female) Treating RN: Curtis Sites Primary Care Natori Gudino: Sherrie Mustache Other Clinician: Referring Ranveer Wahlstrom: Sherrie Mustache Treating Kent Riendeau/Extender: Kathreen Cosier in Treatment: 0 Active Inactive ` Abuse / Safety / Falls / Self Care Management Nursing Diagnoses: Potential for falls Goals: Patient will remain injury free Date Initiated: 05/19/2016 Target Resolution Date: 08/21/2016 Goal Status: Active Interventions: Assess fall risk on admission and as needed Notes: ` Orientation to the Wound Care Program Nursing Diagnoses: Knowledge deficit related to the wound healing center program Goals: Patient/caregiver will verbalize understanding of the Wound Healing Center Program Date Initiated: 05/19/2016 Target Resolution Date: 08/21/2016  Goal Status: Active Interventions: Provide education on orientation to the wound center Notes: ` Venous Leg Ulcer Nursing Diagnoses: Actual venous Insuffiency (use after diagnosis is confirmed) [name], [name] ([id number]) Goals: Patient will maintain optimal edema control Date Initiated: 05/19/2016 Target Resolution Date: 08/21/2016 Goal Status: Active Interventions: Assess peripheral  edema status every visit. Compression as ordered Notes: ` Wound/Skin Impairment Nursing Diagnoses: Impaired tissue integrity Goals: Patient/caregiver will verbalize understanding of skin care regimen Date Initiated: 05/19/2016 Target Resolution Date: 08/21/2016 Goal Status: Active Ulcer/skin breakdown will have a volume reduction of 30% by week 4 Date Initiated: 05/19/2016 Target Resolution Date: 08/21/2016 Goal Status: Active Ulcer/skin breakdown will have a volume reduction of 50% by week 8 Date Initiated: 05/19/2016 Target Resolution Date: 08/21/2016 Goal Status: Active Ulcer/skin breakdown will have a volume reduction of 80% by week 12 Date Initiated: 05/19/2016 Target Resolution Date: 08/21/2016 Goal Status: Active Ulcer/skin breakdown will heal within 14 weeks Date Initiated: 05/19/2016 Target Resolution Date: 08/21/2016 Goal Status: Active Interventions: Assess patient/caregiver ability to obtain necessary supplies Assess patient/caregiver ability to perform ulcer/skin care regimen upon admission and as needed Assess ulceration(s) every visit Notes: Electronic Signature(s) Signed: 05/19/2016 5:52:35 PM By: Curtis Sites Entered By: Curtis Sites on 05/19/2016 14:35:18 Higdon, [name] ([id number] Coverdale, [name] ([id number]) -------------------------------------------------------------------------------- Pain Assessment Details Patient Name: [name] Date of Service: 05/19/2016 1:15 PM Medical Record Number: [id number] Patient Account Number: [id number] Date of Birth/Sex: Aug 15, 1945 (71 y.o. Female) Treating RN: Curtis Sites Primary Care Skai Lickteig: Sherrie Mustache Other Clinician: Referring Kyiesha Millward: Sherrie Mustache Treating Fabiano Ginley/Extender: Kathreen Cosier in Treatment: 0 Active Problems Location of Pain Severity and Description of Pain Patient Has Paino No Site Locations Pain Management and Medication Current Pain Management: Notes Topical or injectable lidocaine is  offered to patient for acute pain when surgical debridement is performed. If needed, Patient is instructed to use over the counter pain medication for the following 24-48 hours after debridement. Wound care MDs do not prescribed pain medications. Patient has chronic pain or uncontrolled pain. Patient has been instructed to make an appointment with their Primary Care Physician for pain management. Electronic Signature(s) Signed: 05/19/2016 5:52:35 PM By: Curtis Sites Entered By: Curtis Sites on 05/19/2016 13:43:56 [name], [name] ([id number]) -------------------------------------------------------------------------------- Patient/Caregiver Education Details Patient Name: [name] Date of Service: 05/19/2016 1:15 PM Medical Record Number: [id number] Patient Account Number: [id number] Date of Birth/Gender: 1945-07-31 (70 y.o. Female) Treating RN: Curtis Sites Primary Care Physician: Sherrie Mustache Other Clinician: Referring Physician: Sherrie Mustache Treating Physician/Extender: Kathreen Cosier in Treatment: 0 Education Assessment Education Provided To: Patient and Caregiver Education Topics Provided Wound/Skin Impairment: Handouts: Other: wound care as ordered Methods: Demonstration, Explain/Verbal Responses: State content correctly Electronic Signature(s) Signed: 05/19/2016 5:52:35 PM By: Curtis Sites Entered By: Curtis Sites on 05/19/2016 15:21:39 [name], [name] ([id number]) -------------------------------------------------------------------------------- Wound Assessment Details Patient Name: [name] Date of Service: 05/19/2016 1:15 PM Medical Record Number: [id number] Patient Account Number: [id number] Date of Birth/Sex: 08-Apr-1945 (71 y.o. Female) Treating RN: Curtis Sites Primary Care Vida Nicol: Sherrie Mustache Other Clinician: Referring Alexsia Klindt: Sherrie Mustache Treating Genea Rheaume/Extender: Kathreen Cosier in Treatment: 0 Wound Status Wound Number: 7  Primary Venous Leg Ulcer Etiology: Wound Location: Left Lower Leg - Medial Wound Open Wounding Event: Blister Status: Date Acquired: 05/04/2016 Comorbid Hypertension, Peripheral Arterial Weeks Of Treatment: 0 History: Disease, Type II Diabetes, Clustered Wound: Yes Osteoarthritis, Neuropathy Photos Photo Uploaded By: Elliot Gurney, RN, BSN, Kim on 05/19/2016 16:14:13 Wound Measurements Length: (cm) 7 Width: (cm) 6.5  Depth: (cm) 0.1 Clustered Quantity: 5 Area: (cm) 35.736 Volume: (cm) 3.574 % Reduction in Area: 0% % Reduction in Volume: 0% Epithelialization: None Tunneling: No Undermining: No Wound Description Classification: Partial Thickness Foul Odor Aft Diabetic Severity (Wagner): Grade 1 Slough/Fibrin Wound Margin: Flat and Intact Exudate Amount: Large Exudate Type: Serous Exudate Color: amber er Cleansing: No o No Wound Bed Granulation Amount: Medium (34-66%) Exposed Structure Granulation Quality: Red Fascia Exposed: No Bow, Cherrie N. ([id number]) Necrotic Amount: Medium (34-66%) Fat Layer (Subcutaneous Tissue) Exposed: No Necrotic Quality: Eschar, Adherent Slough Tendon Exposed: No Muscle Exposed: No Joint Exposed: No Bone Exposed: No Limited to Skin Breakdown Periwound Skin Texture Texture Color No Abnormalities Noted: No No Abnormalities Noted: No Callus: No Atrophie Blanche: No Crepitus: No Cyanosis: No Excoriation: No Ecchymosis: No Induration: No Erythema: No Rash: No Hemosiderin Staining: No Scarring: No Mottled: No Pallor: No Moisture Rubor: No No Abnormalities Noted: No Dry / Scaly: No Temperature / Pain Maceration: No Temperature: No Abnormality Tenderness on Palpation: Yes Wound Preparation Ulcer Cleansing: Rinsed/Irrigated with Saline Topical Anesthetic Applied: Other: lidocaine 4%, Treatment Notes Wound #7 (Left, Medial Lower Leg) 1. Cleansed with: Clean wound with Normal Saline 2. Anesthetic Topical Lidocaine 4% cream to  wound bed prior to debridement 4. Dressing Applied: Prisma Ag 5. Secondary Dressing Applied ABD Pad 7. Secured with 3 Layer Compression System - Left Lower Extremity Electronic Signature(s) Signed: 05/19/2016 5:52:35 PM By: Curtis Sites Entered By: Curtis Sites on 05/19/2016 14:00:06 Rutledge, [name] -------------------------------------------------------------------------------- Wound Assessment Details Patient Name: [name] Date of Service: 05/19/2016 1:15 PM Medical Record Number: [id number] Patient Account Number: [id number] Date of Birth/Sex: 28-Apr-1945 (70 y.o. Female) Treating RN: Curtis Sites Primary Care Brexley Cutshaw: Sherrie Mustache Other Clinician: Referring Armarion Greek: Sherrie Mustache Treating Sharnetta Gielow/Extender: Kathreen Cosier in Treatment: 0 Wound Status Wound Number: 8 Primary 2nd degree Burn Etiology: Wound Location: Left Forearm Wound Open Wounding Event: Thermal Burn Status: Date Acquired: 05/11/2016 Comorbid Hypertension, Peripheral Arterial Weeks Of Treatment: 0 History: Disease, Type II Diabetes, Clustered Wound: No Osteoarthritis, Neuropathy Photos Photo Uploaded By: Elliot Gurney, RN, BSN, Kim on 05/19/2016 16:14:14 Wound Measurements Length: (cm) 2.5 Width: (cm) 1.4 Depth: (cm) 0.2 Area: (cm) 2.749 Volume: (cm) 0.55 % Reduction in Area: % Reduction in Volume: Epithelialization: None Tunneling: No Undermining: No Wound Description Classification: Partial Thickness Foul Odor Aft Wound Margin: Flat and Intact Slough/Fibrin Exudate Amount: Medium Exudate Type: Serous Exudate Color: amber er Cleansing: No o No Wound Bed Granulation Amount: Small (1-33%) Exposed Structure Granulation Quality: Pink Fascia Exposed: No Necrotic Amount: Large (67-100%) Fat Layer (Subcutaneous Tissue) Exposed: No Necrotic Quality: Eschar, Adherent Slough Tendon Exposed: No Bryk, Amariss N. ([id number]) Muscle Exposed: No Joint Exposed: No Bone  Exposed: No Limited to Skin Breakdown Periwound Skin Texture Texture Color No Abnormalities Noted: No No Abnormalities Noted: No Callus: No Atrophie Blanche: No Crepitus: No Cyanosis: No Excoriation: No Ecchymosis: No Induration: No Erythema: No Rash: No Hemosiderin Staining: No Scarring: No Mottled: No Pallor: No Moisture Rubor: No No Abnormalities Noted: No Dry / Scaly: No Temperature / Pain Maceration: No Temperature: No Abnormality Wound Preparation Ulcer Cleansing: Rinsed/Irrigated with Saline Topical Anesthetic Applied: Other: lidocaine 4%, Treatment Notes Wound #8 (Left Forearm) 1. Cleansed with: Clean wound with Normal Saline 2. Anesthetic Topical Lidocaine 4% cream to wound bed prior to debridement 4. Dressing Applied: Hydrogel 5. Secondary Dressing Applied Kerlix/Conform Non-Adherent pad 7. Secured with Tape Other (specify in notes) Notes secured lightly with coban Electronic Signature(s) Signed: 05/19/2016  5:52:35 PM By: Curtis Sites Entered By: Curtis Sites on 05/19/2016 14:24:14 Navratil, [name] ([id number]) -------------------------------------------------------------------------------- Vitals Details Patient Name: [name] Date of Service: 05/19/2016 1:15 PM Medical Record Number: [id number] Patient Account Number: [id number] Date of Birth/Sex: [birth year] (71 y.o. Female) Treating RN: Curtis Sites Primary Care Glessie Eustice: Sherrie Mustache Other Clinician: Referring Kj Imbert: Sherrie Mustache Treating Mckena Chern/Extender: Kathreen Cosier in Treatment: 0 Vital Signs Time Taken: 13:46 Temperature (F): 98.2 Height (in): 68 Pulse (bpm): 74 Source: Measured Respiratory Rate (breaths/min): 20 Weight (lbs): 300 Blood Pressure (mmHg): 116/85 Source: Measured Reference Range: 80 - 120 mg / dl Body Mass Index (BMI): 45.6 Electronic Signature(s) Signed: 05/19/2016 5:52:35 PM By: Curtis Sites Entered By: Curtis Sites on 05/19/2016  13:46:52

## 2016-05-22 DIAGNOSIS — N289 Disorder of kidney and ureter, unspecified: Secondary | ICD-10-CM | POA: Diagnosis not present

## 2016-05-22 DIAGNOSIS — E1165 Type 2 diabetes mellitus with hyperglycemia: Secondary | ICD-10-CM | POA: Diagnosis not present

## 2016-05-22 DIAGNOSIS — E784 Other hyperlipidemia: Secondary | ICD-10-CM | POA: Diagnosis not present

## 2016-05-26 ENCOUNTER — Ambulatory Visit: Payer: Self-pay | Admitting: Internal Medicine

## 2016-05-27 ENCOUNTER — Encounter: Payer: Medicare Other | Admitting: Internal Medicine

## 2016-05-27 DIAGNOSIS — I10 Essential (primary) hypertension: Secondary | ICD-10-CM | POA: Diagnosis not present

## 2016-05-27 DIAGNOSIS — I87312 Chronic venous hypertension (idiopathic) with ulcer of left lower extremity: Secondary | ICD-10-CM | POA: Diagnosis not present

## 2016-05-27 DIAGNOSIS — Z885 Allergy status to narcotic agent status: Secondary | ICD-10-CM | POA: Diagnosis not present

## 2016-05-27 DIAGNOSIS — L97822 Non-pressure chronic ulcer of other part of left lower leg with fat layer exposed: Secondary | ICD-10-CM | POA: Diagnosis not present

## 2016-05-27 DIAGNOSIS — E1151 Type 2 diabetes mellitus with diabetic peripheral angiopathy without gangrene: Secondary | ICD-10-CM | POA: Diagnosis not present

## 2016-05-27 DIAGNOSIS — L97821 Non-pressure chronic ulcer of other part of left lower leg limited to breakdown of skin: Secondary | ICD-10-CM | POA: Diagnosis not present

## 2016-05-27 DIAGNOSIS — T22032A Burn of unspecified degree of left upper arm, initial encounter: Secondary | ICD-10-CM | POA: Diagnosis not present

## 2016-05-27 DIAGNOSIS — M1990 Unspecified osteoarthritis, unspecified site: Secondary | ICD-10-CM | POA: Diagnosis not present

## 2016-05-27 DIAGNOSIS — Z91041 Radiographic dye allergy status: Secondary | ICD-10-CM | POA: Diagnosis not present

## 2016-05-27 DIAGNOSIS — Z880 Allergy status to penicillin: Secondary | ICD-10-CM | POA: Diagnosis not present

## 2016-05-27 DIAGNOSIS — T22012A Burn of unspecified degree of left forearm, initial encounter: Secondary | ICD-10-CM | POA: Diagnosis not present

## 2016-05-27 DIAGNOSIS — E11622 Type 2 diabetes mellitus with other skin ulcer: Secondary | ICD-10-CM | POA: Diagnosis not present

## 2016-05-27 DIAGNOSIS — Z7984 Long term (current) use of oral hypoglycemic drugs: Secondary | ICD-10-CM | POA: Diagnosis not present

## 2016-05-29 NOTE — Progress Notes (Signed)
TONYETTA, BERKO ([id number]) Visit Report for 05/27/2016 Arrival Information Details Patient Name: [name], [name] Date of Service: 05/27/2016 12:30 PM Medical Record Patient Account Number: [id number] Number: Treating RN: Curtis Sites 1945-11-15 (71 y.o. Other Clinician: Date of Birth/Sex: Female) Treating ROBSON, MICHAEL Primary Care Yavier Snider: Sherrie Mustache Nandika Stetzer/Extender: G Referring Moncerrat Burnstein: Sherrie Mustache Weeks in Treatment: 1 Visit Information History Since Last Visit Added or deleted any medications: No Patient Arrived: Wheel Chair Any new allergies or adverse reactions: No Arrival Time: 12:35 Had a fall or experienced change in No Accompanied By: friend activities of daily living that may affect Transfer Assistance: None risk of falls: Patient Identification Verified: Yes Signs or symptoms of abuse/neglect since last No Secondary Verification Process Yes visito Completed: Hospitalized since last visit: No Patient Has Alerts: Yes Has Dressing in Place as Prescribed: Yes Patient Alerts: aspirin 81, Has Compression in Place as Prescribed: Yes plavix Pain Present Now: No DMII Electronic Signature(s) Signed: 05/27/2016 5:10:19 PM By: Curtis Sites Entered By: Curtis Sites on 05/27/2016 12:38:14 Akers, [name] ([id number]) -------------------------------------------------------------------------------- Encounter Discharge Information Details Patient Name: [name] Date of Service: 05/27/2016 12:30 PM Medical Record Patient Account Number: [id number] Number: Treating RN: Curtis Sites 27-Sep-1945 (71 y.o. Other Clinician: Date of Birth/Sex: Female) Treating ROBSON, MICHAEL Primary Care Jceon Alverio: Sherrie Mustache Amaranta Mehl/Extender: G Referring Dalyn Kjos: Augustin Coupe in Treatment: 1 Encounter Discharge Information Items Discharge Pain Level: 0 Discharge Condition: Stable Ambulatory Status: Wheelchair Discharge Destination:  Home Transportation: Private Auto Accompanied By: friend Schedule Follow-up Appointment: Yes Medication Reconciliation completed and provided to Patient/Care No Trygve Thal: Provided on Clinical Summary of Care: 05/27/2016 Form Type Recipient Paper Patient MW Electronic Signature(s) Signed: 05/27/2016 1:34:37 PM By: Gwenlyn Perking Entered By: Gwenlyn Perking on 05/27/2016 13:34:37 Himes, [name] ([id number]) -------------------------------------------------------------------------------- Lower Extremity Assessment Details Patient Name: [name] Date of Service: 05/27/2016 12:30 PM Medical Record Patient Account Number: [id number] Number: Treating RN: Curtis Sites Jul 16, 1945 (71 y.o. Other Clinician: Date of Birth/Sex: Female) Treating ROBSON, MICHAEL Primary Care Romie Keeble: Sherrie Mustache Mihir Flanigan/Extender: G Referring Delanee Xin: Sherrie Mustache Weeks in Treatment: 1 Edema Assessment Assessed: [Left: No] [Right: No] E[Left: dema] [Right: :] Calf Left: Right: Point of Measurement: 32 cm From Medial Instep 39.3 cm cm Ankle Left: Right: Point of Measurement: 12 cm From Medial Instep 24.7 cm cm Vascular Assessment Pulses: Dorsalis Pedis Palpable: [Left:Yes] Posterior Tibial Extremity colors, hair growth, and conditions: Extremity Color: [Left:Hyperpigmented] Hair Growth on Extremity: [Left:No] Temperature of Extremity: [Left:Warm] Capillary Refill: [Left:< 3 seconds] Electronic Signature(s) Signed: 05/27/2016 5:10:19 PM By: Curtis Sites Entered By: Curtis Sites on 05/27/2016 12:55:40 Melucci, [name] ([id number]) -------------------------------------------------------------------------------- Multi Wound Chart Details Patient Name: [name] Date of Service: 05/27/2016 12:30 PM Medical Record Patient Account Number: [id number] Number: Treating RN: Curtis Sites 1945-09-30 (71 y.o. Other Clinician: Date of Birth/Sex: Female) Treating ROBSON,  MICHAEL Primary Care Louise Rawson: Sherrie Mustache Zeki Bedrosian/Extender: G Referring Eara Burruel: Sherrie Mustache Weeks in Treatment: 1 Vital Signs Height(in): 68 Pulse(bpm): 79 Weight(lbs): 300 Blood Pressure 142/55 (mmHg): Body Mass Index(BMI): 46 Temperature(F): 97.6 Respiratory Rate 20 (breaths/min): Photos: [7:No Photos] [8:No Photos] [N/A:N/A] Wound Location: [7:Left Lower Leg - Medial] [8:Left Forearm] [N/A:N/A] Wounding Event: [7:Blister] [8:Thermal Burn] [N/A:N/A] Primary Etiology: [7:Venous Leg Ulcer] [8:2nd degree Burn] [N/A:N/A] Comorbid History: [7:Hypertension, Peripheral Arterial Disease, Type II Diabetes, Osteoarthritis, Neuropathy] [8:Hypertension, Peripheral Arterial Disease, Type II Diabetes, Osteoarthritis, Neuropathy] [N/A:N/A] Date Acquired: [7:05/04/2016] [8:05/11/2016] [N/A:N/A] Weeks of Treatment: [7:1] [8:1] [N/A:N/A] Wound Status: [7:Open] [8:Open] [N/A:N/A] Clustered  Wound: [7:Yes] [8:No] [N/A:N/A] Clustered Quantity: [7:5] [8:N/A] [N/A:N/A] Measurements L x W x D 5x6x0.1 [8:2.4x1.2x0.1] [N/A:N/A] (cm) Area (cm) : [7:23.562] [8:2.262] [N/A:N/A] Volume (cm) : [7:2.356] [8:0.226] [N/A:N/A] % Reduction in Area: [7:34.10%] [8:17.70%] [N/A:N/A] % Reduction in Volume: 34.10% [8:58.90%] [N/A:N/A] Classification: [7:Partial Thickness] [8:Partial Thickness] [N/A:N/A] HBO Classification: [7:Grade 1] [8:N/A] [N/A:N/A] Exudate Amount: [7:Large] [8:Medium] [N/A:N/A] Exudate Type: [7:Serous] [8:Serous] [N/A:N/A] Exudate Color: [7:amber] [8:amber] [N/A:N/A] Wound Margin: [7:Flat and Intact] [8:Flat and Intact] [N/A:N/A] Granulation Amount: [7:Large (67-100%)] [8:Large (67-100%)] [N/A:N/A] Granulation Quality: [7:Red] [8:Pink] [N/A:N/A] Necrotic Amount: [7:Small (1-33%)] [8:Small (1-33%)] [N/A:N/A] Necrotic Tissue: Eschar, Adherent Slough Adherent Slough N/A Exposed Structures: Fascia: No Fascia: No N/A Fat Layer (Subcutaneous Fat Layer (Subcutaneous Tissue) Exposed:  No Tissue) Exposed: No Tendon: No Tendon: No Muscle: No Muscle: No Joint: No Joint: No Bone: No Bone: No Limited to Skin Limited to Skin Breakdown Breakdown Epithelialization: Medium (34-66%) Small (1-33%) N/A Periwound Skin Texture: Excoriation: No Excoriation: No N/A Induration: No Induration: No Callus: No Callus: No Crepitus: No Crepitus: No Rash: No Rash: No Scarring: No Scarring: No Periwound Skin Maceration: No Maceration: No N/A Moisture: Dry/Scaly: No Dry/Scaly: No Periwound Skin Color: Atrophie Blanche: No Atrophie Blanche: No N/A Cyanosis: No Cyanosis: No Ecchymosis: No Ecchymosis: No Erythema: No Erythema: No Hemosiderin Staining: No Hemosiderin Staining: No Mottled: No Mottled: No Pallor: No Pallor: No Rubor: No Rubor: No Temperature: No Abnormality No Abnormality N/A Tenderness on Yes No N/A Palpation: Wound Preparation: Ulcer Cleansing: Other: Ulcer Cleansing: N/A soap and water Rinsed/Irrigated with Saline Topical Anesthetic Applied: Other: lidocaine Topical Anesthetic 4% Applied: Other: lidocaine 4% Treatment Notes Wound #7 (Left, Medial Lower Leg) 1. Cleansed with: Cleanse wound with antibacterial soap and water 2. Anesthetic Topical Lidocaine 4% cream to wound bed prior to debridement 4. Dressing Applied: Prisma Ag 5. Secondary Dressing Applied ABD Pad 7. Secured with DAINE, CROKER ([id number]) Tape Other (specify in notes) Notes kerlix and coban wrap with unna to anchor Electronic Signature(s) Signed: 05/27/2016 5:13:56 PM By: Baltazar Najjar MD Entered By: Baltazar Najjar on 05/27/2016 14:45:53 Garrott, [name] ([id number]) -------------------------------------------------------------------------------- Multi-Disciplinary Care Plan Details Patient Name: [name] Date of Service: 05/27/2016 12:30 PM Medical Record Patient Account Number: [id number] Number: Treating RN: Curtis Sites 1945-08-01 (70 y.o.  Other Clinician: Date of Birth/Sex: Female) Treating ROBSON, MICHAEL Primary Care Kem Parcher: Sherrie Mustache Tomekia Helton/Extender: G Referring Kaleigh Spiegelman: Sherrie Mustache Weeks in Treatment: 1 Active Inactive ` Abuse / Safety / Falls / Self Care Management Nursing Diagnoses: Potential for falls Goals: Patient will remain injury free Date Initiated: 05/19/2016 Target Resolution Date: 08/21/2016 Goal Status: Active Interventions: Assess fall risk on admission and as needed Notes: ` Orientation to the Wound Care Program Nursing Diagnoses: Knowledge deficit related to the wound healing center program Goals: Patient/caregiver will verbalize understanding of the Wound Healing Center Program Date Initiated: 05/19/2016 Target Resolution Date: 08/21/2016 Goal Status: Active Interventions: Provide education on orientation to the wound center Notes: ` Venous Leg Ulcer Nursing Diagnoses: SHAIVI, ROTHSCHILD ([id number]) Actual venous Insuffiency (use after diagnosis is confirmed) Goals: Patient will maintain optimal edema control Date Initiated: 05/19/2016 Target Resolution Date: 08/21/2016 Goal Status: Active Interventions: Assess peripheral edema status every visit. Compression as ordered Notes: ` Wound/Skin Impairment Nursing Diagnoses: Impaired tissue integrity Goals: Patient/caregiver will verbalize understanding of skin care regimen Date Initiated: 05/19/2016 Target Resolution Date: 08/21/2016 Goal Status: Active Ulcer/skin breakdown will have a volume reduction of 30% by week 4 Date Initiated: 05/19/2016 Target Resolution Date: 08/21/2016 Goal  Status: Active Ulcer/skin breakdown will have a volume reduction of 50% by week 8 Date Initiated: 05/19/2016 Target Resolution Date: 08/21/2016 Goal Status: Active Ulcer/skin breakdown will have a volume reduction of 80% by week 12 Date Initiated: 05/19/2016 Target Resolution Date: 08/21/2016 Goal Status: Active Ulcer/skin breakdown will heal within 14  weeks Date Initiated: 05/19/2016 Target Resolution Date: 08/21/2016 Goal Status: Active Interventions: Assess patient/caregiver ability to obtain necessary supplies Assess patient/caregiver ability to perform ulcer/skin care regimen upon admission and as needed Assess ulceration(s) every visit Notes: Electronic Signature(s) Signed: 05/27/2016 5:10:19 PM By: [name], [name] ([id number]) Entered By: Curtis Sites on 05/27/2016 12:55:46 Belizaire, [name] ([id number]) -------------------------------------------------------------------------------- Pain Assessment Details Patient Name: [name] Date of Service: 05/27/2016 12:30 PM Medical Record Patient Account Number: [id number] Number: Treating RN: Curtis Sites 09/07/1945 (70 y.o. Other Clinician: Date of Birth/Sex: Female) Treating ROBSON, MICHAEL Primary Care Yesly Gerety: Sherrie Mustache Kalynne Womac/Extender: G Referring Bocephus Cali: Sherrie Mustache Weeks in Treatment: 1 Active Problems Location of Pain Severity and Description of Pain Patient Has Paino No Site Locations Pain Management and Medication Current Pain Management: Notes Topical or injectable lidocaine is offered to patient for acute pain when surgical debridement is performed. If needed, Patient is instructed to use over the counter pain medication for the following 24-48 hours after debridement. Wound care MDs do not prescribed pain medications. Patient has chronic pain or uncontrolled pain. Patient has been instructed to make an appointment with their Primary Care Physician for pain management. Electronic Signature(s) Signed: 05/27/2016 5:10:19 PM By: Curtis Sites Entered By: Curtis Sites on 05/27/2016 12:38:21 LISSA, ROWLES ([id number]) -------------------------------------------------------------------------------- Patient/Caregiver Education Details Patient Name: [name] Date of Service: 05/27/2016 12:30 PM Medical Record Patient  Account Number: [id number] Number: Treating RN: Curtis Sites Feb 10, 1946 (70 y.o. Other Clinician: Date of Birth/Gender: Female) Treating ROBSON, MICHAEL Primary Care Physician: Sherrie Mustache Physician/Extender: G Referring Physician: Augustin Coupe in Treatment: 1 Education Assessment Education Provided To: Patient and Caregiver Education Topics Provided Wound/Skin Impairment: Handouts: Other: wound care as ordered Methods: Explain/Verbal Responses: State content correctly Electronic Signature(s) Signed: 05/27/2016 5:10:19 PM By: Curtis Sites Entered By: Curtis Sites on 05/27/2016 13:14:40 Branscom, [name] ([id number]) -------------------------------------------------------------------------------- Wound Assessment Details Patient Name: [name] Date of Service: 05/27/2016 12:30 PM Medical Record Patient Account Number: [id number] Number: Treating RN: Curtis Sites 11/19/1945 (70 y.o. Other Clinician: Date of Birth/Sex: Female) Treating ROBSON, MICHAEL Primary Care Sachi Boulay: Sherrie Mustache Varie Machamer/Extender: G Referring Bettye Sitton: Sherrie Mustache Weeks in Treatment: 1 Wound Status Wound Number: 7 Primary Venous Leg Ulcer Etiology: Wound Location: Left Lower Leg - Medial Wound Open Wounding Event: Blister Status: Date Acquired: 05/04/2016 Comorbid Hypertension, Peripheral Arterial Weeks Of Treatment: 1 History: Disease, Type II Diabetes, Clustered Wound: Yes Osteoarthritis, Neuropathy Wound Measurements Length: (cm) 5 Width: (cm) 6 Depth: (cm) 0.1 Clustered Quantity: 5 Area: (cm) 23.562 Volume: (cm) 2.356 % Reduction in Area: 34.1% % Reduction in Volume: 34.1% Epithelialization: Medium (34-66%) Tunneling: No Undermining: No Wound Description Classification: Partial Thickness Foul Odor Af Diabetic Severity (Wagner): Grade 1 Slough/Fibri Wound Margin: Flat and Intact Exudate Amount: Large Exudate Type: Serous Exudate  Color: amber ter Cleansing: No no No Wound Bed Granulation Amount: Large (67-100%) Exposed Structure Granulation Quality: Red Fascia Exposed: No Necrotic Amount: Small (1-33%) Fat Layer (Subcutaneous Tissue) Exposed: No Necrotic Quality: Eschar, Adherent Slough Tendon Exposed: No Muscle Exposed: No Joint Exposed: No Bone Exposed: No Limited to Skin Breakdown Periwound Skin Texture Texture Color Gilman, Kanylah N. (  [id number]) No Abnormalities Noted: No No Abnormalities Noted: No Callus: No Atrophie Blanche: No Crepitus: No Cyanosis: No Excoriation: No Ecchymosis: No Induration: No Erythema: No Rash: No Hemosiderin Staining: No Scarring: No Mottled: No Pallor: No Moisture Rubor: No No Abnormalities Noted: No Dry / Scaly: No Temperature / Pain Maceration: No Temperature: No Abnormality Tenderness on Palpation: Yes Wound Preparation Ulcer Cleansing: Other: soap and water, Topical Anesthetic Applied: Other: lidocaine 4%, Treatment Notes Wound #7 (Left, Medial Lower Leg) 1. Cleansed with: Cleanse wound with antibacterial soap and water 2. Anesthetic Topical Lidocaine 4% cream to wound bed prior to debridement 4. Dressing Applied: Prisma Ag 5. Secondary Dressing Applied ABD Pad 7. Secured with Tape Other (specify in notes) Notes kerlix and coban wrap with unna to anchor Electronic Signature(s) Signed: 05/27/2016 5:10:19 PM By: Curtis Sites Entered By: Curtis Sites on 05/27/2016 12:53:22 Mayor, [name] ([id number]) -------------------------------------------------------------------------------- Wound Assessment Details Patient Name: [name] Date of Service: 05/27/2016 12:30 PM Medical Record Patient Account Number: [id number] Number: Treating RN: Curtis Sites [birth year] (70 y.o. Other Clinician: Date of Birth/Sex: Female) Treating ROBSON, MICHAEL Primary Care Keevin Panebianco: Sherrie Mustache Jariel Drost/Extender: G Referring Jareth Pardee: Sherrie Mustache Weeks in Treatment: 1 Wound Status Wound Number: 8 Primary 2nd degree Burn Etiology: Wound Location: Left Forearm Wound Open Wounding Event: Thermal Burn Status: Date Acquired: 05/11/2016 Comorbid Hypertension, Peripheral Arterial Weeks Of Treatment: 1 History: Disease, Type II Diabetes, Clustered Wound: No Osteoarthritis, Neuropathy Wound Measurements Length: (cm) 2.4 Width: (cm) 1.2 Depth: (cm) 0.1 Area: (cm) 2.262 Volume: (cm) 0.226 % Reduction in Area: 17.7% % Reduction in Volume: 58.9% Epithelialization: Small (1-33%) Tunneling: No Undermining: No Wound Description Classification: Partial Thickness Foul Odor After Wound Margin: Flat and Intact Slough/Fibrino Exudate Amount: Medium Exudate Type: Serous Exudate Color: amber Cleansing: No No Wound Bed Granulation Amount: Large (67-100%) Exposed Structure Granulation Quality: Pink Fascia Exposed: No Necrotic Amount: Small (1-33%) Fat Layer (Subcutaneous Tissue) Exposed: No Necrotic Quality: Adherent Slough Tendon Exposed: No Muscle Exposed: No Joint Exposed: No Bone Exposed: No Limited to Skin Breakdown Periwound Skin Texture Texture Color No Abnormalities Noted: No No Abnormalities Noted: No Callus: No Atrophie Blanche: No SHARMEL, BALLANTINE ([id number]) Crepitus: No Cyanosis: No Excoriation: No Ecchymosis: No Induration: No Erythema: No Rash: No Hemosiderin Staining: No Scarring: No Mottled: No Pallor: No Moisture Rubor: No No Abnormalities Noted: No Dry / Scaly: No Temperature / Pain Maceration: No Temperature: No Abnormality Wound Preparation Ulcer Cleansing: Rinsed/Irrigated with Saline Topical Anesthetic Applied: Other: lidocaine 4%, Electronic Signature(s) Signed: 05/27/2016 5:10:19 PM By: Curtis Sites Entered By: Curtis Sites on 05/27/2016 12:53:46 Negro, [name] ([id number]) -------------------------------------------------------------------------------- Vitals  Details Patient Name: [name] Date of Service: 05/27/2016 12:30 PM Medical Record Patient Account Number: [id number] Number: Treating RN: Curtis Sites 1945-06-30 (70 y.o. Other Clinician: Date of Birth/Sex: Female) Treating ROBSON, MICHAEL Primary Care Dyann Goodspeed: Sherrie Mustache Yehya Brendle/Extender: G Referring [place]: Sherrie Mustache Weeks in Treatment: 1 Vital Signs Time Taken: 12:39 Temperature (F): 97.6 Height (in): 68 Pulse (bpm): 79 Weight (lbs): 300 Respiratory Rate (breaths/min): 20 Body Mass Index (BMI): 45.6 Blood Pressure (mmHg): 142/55 Reference Range: 80 - 120 mg / dl Electronic Signature(s) Signed: 05/27/2016 5:10:19 PM By: Curtis Sites Entered By: Curtis Sites on 05/27/2016 12:39:25

## 2016-05-29 NOTE — Progress Notes (Signed)
[name] Visit Report for 05/27/2016 Chief Complaint Document Details Patient Name: [name], [name] Date of Service: 05/27/2016 12:30 PM Medical Record Patient Account Number: [id number] Number: Treating RN: [name] 1945/06/28 (71 y.o. Other Clinician: Date of Birth/Sex: Female) Treating [name], [name] Primary Care Provider: Sherrie [name] Provider/Extender: G Referring Provider: Sherrie [name] Weeks in Treatment: 1 Information Obtained from: Patient Chief Complaint patient is here for initial evaluation of left lower extremity ulcers and a burn to the left forearm Electronic Signature(s) Signed: 05/27/2016 5:13:56 PM By: [name] Najjar MD Entered By: [name] on 05/27/2016 14:46:05 [name], [name] ([id number]) -------------------------------------------------------------------------------- HPI Details Patient Name: [name] Date of Service: 05/27/2016 12:30 PM Medical Record Patient Account Number: [id number] Number: Treating RN: [name] December 07, 1945 (71 y.o. Other Clinician: Date of Birth/Sex: Female) Treating [name], [name] Primary Care Provider: Sherrie [name] Provider/Extender: G Referring Provider: Sherrie [name] Weeks in Treatment: 1 History of Present Illness Location: left lower extremity and left forearm Duration: left lower extremity ulcers have been present for approximately 2 weeks (blisters formed approx 3 weeks ago) and the left forearm for less than 1 week Context: the left lower extremity ulcers are secondary to edema and spontaneous blister formation; the left forearm is secondary to a burn HPI Description: Pleasant 71 year old with diabetes (no hemoglobin A1c available) and severe peripheral vascular disease. She reports a prior left lower extremity stent. Records unavailable. Underwent multiple level angioplasty of left lower extremity 10/29/2014 by Dr. Wyn [name]. Developed left calf ulcerations in  late December 2016. Denies any trauma. Seen by her PCP. Bactrim prescribed. Referred to the wound clinic. Biopsy culture 02/20/2015 grew methicillin sensitive staph aureus, sensitive to Bactrim. Performing dressing changes with Prisma. Using a Tubigrip for edema control. Scheduled to see Dr. Wyn [name] in follow-up 02/25/2015. Cancelled because of weather. Rescheduled for 03/06/2015. She returns to clinic for follow-up and is without new complaints. Pain improved. No ischemic rest pain. No fever or chills. Minimal drainage. 03/13/2015 -- records obtained from [facility] shows that she was seen by [name] on 03/12/2015 and given tramadol and other symptomatic treatment. Recent ABI done showed right to be 0.94 and left to be 0.47 a left lower extremity arterial duplex was notable for an occluded stent and distal ATA. Recommendations were for left lower extremity angiogram with intervention. on 03/11/2015 she was taken up for a aortogram with angioplasty of the left posterior tibial artery and tibioperoneal trunk, above-knee popliteal artery and almost entire SFA, stent placement to the SFA and popliteal artery. 03/20/15 her arterial interventions noted. The patient states her left leg feels better. He ischemic wound on her left anterior leg. She has been using Santyl 04/09/15; the patient arrives back today. 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 I was concerned about her last week as she had weeping edema fluid and areas of threatened ulceration around her wound. I ordered a silver alginate, Profore light wrap. She comes back today with an Radio broadcast assistant on apparently applied by [name] office. Her edema is under much better control and the weeping area here last week surrounding her major wound has resolved. She does have another open area which is small and superficial. I'm not sure I would put an Unna boot on this lady's degree of  arterial insufficiency. 04/16/15; her original small wound appears to be healthy with good granulation her measurements are improved. She has a small area underneath this. Both  underwent selective debridement since surface slough [name] ([id number]) 04/23/15; the original wound has filled in there is healthy granulation here. The small area underneath this is healed. No debridement was required 04/30/15; the wound no longer has any depth. no Debridement required 05/07/15; the wound appears to be stable. Advancing epithelialization. Decrease wound volume. No debridement is required. She has known arterial disease status post stent placement to the SFA and popliteal artery. 05/14/15 unfortunately the wound is not really decreased much this week she still has a rim of epithelialization. The wound bed looks stable nevertheless I did a surgical debridement and there is some adherent surface slough. She has known PAD however the blood supply appears to be adequate status post stent to the SFA and popliteal and popliteal artery 05/21/15 wound is healthier than last week. No debridement is necessary. Known PAD status post stent to the SFA and popliteal artery 05/28/15; wound is no different from last week and in fact looks "stalled". Fairly we put Iodoflex on him last week however home health used Prisma 06/04/15 if anything the wound area appears to be larger. 06/11/15 switch to silver alginate last week and this seems to have helped. There is a thin layer of epithelialization only visible under the light 06/18/15; the patient had her leg traumatized when a grandchild fell over her leg at a party last weekend. Her original wound is just about closed very tiny however she has a new larger superficial wound just distal to the original wound. No evidence of infection 06/25/15; both of the wounds on her left leg including the chronic wound we have been treating her for a prolonged period of time now, and the  traumatic wound from last week both are fully epithelialized. She apparently had a slip this week and managed to lift her nail off her right great toe. 07/02/15; patient's legs are carefully inspected. Her original wound on the left medial leg is completely healed. Traumatic area on her right great toenail from last week is also healed. She has combined venous and arterial insufficiency we have obtained her juxtalite stockings bilaterally. 07/16/15 this is a patient we recently discharged. She states that the juxtalite stocking cause irritation and blistering on her anterior left leg. She is not therefore used them in the last 6 days 07/23/15; once again the patient has no open area here. Her edema is well controlled. She is very reluctant to try the juxtalite's again because she attributes this to the blistering on her left anterior leg. Both the medial original wound, and the blister on the anterior left leg give healed === 05/19/16- the patient is here for initial evaluation of her left lower extremity ulcers and a burn to the left forearm. She states that upon discharge last year she was given juxta light compression wraps, she could not tolerate those and was given compression stockings which she recently converted to compression socks. She states that the compression socks are "medium" and is unable to articulate the exact millimeters of mercury. She states that the left lower extremity developed spontaneous blisters which eventually ulcerated 2 weeks ago. She has not been wearing compression stockings or socks to either extremity since this occurred. She states that she burned her left forearm on a hot pan while cooking for [employer]. She has been applying no topical agents to either. She is a diabetic, she is unaware of her recent A1c but states her morning glucose was 197. She has a follow-up with her PCP,  DeannaJadali this Friday. She has known mixed arterial and venous disease. 05/27/16;  the patient has a burn suffered on her left arm while moving pots on her stove. She has been using hydrogel to this. She also has venous insufficiency wounds on the left lower extremity. These apparently occurred while she was wearing some form of compression although I'm really not sure how aggressive the compression was. I suspect this was inadequate to maintain her edema control. Nevertheless wasn't really clear to me after talking to her today. Electronic Signature(s) [name], [name] ([id number]) Signed: 05/27/2016 5:13:56 PM By: [name] Najjar MD Entered By: [name] on 05/27/2016 14:47:16 [name], [name] ([id number]) -------------------------------------------------------------------------------- Physical Exam Details Patient Name: [name] Date of Service: 05/27/2016 12:30 PM Medical Record Patient Account Number: [id number] Number: Treating RN: [name] 06/14/1945 (70 y.o. Other Clinician: Date of Birth/Sex: Female) Treating [name], [name] Primary Care Provider: Sherrie [name] Provider/Extender: G Referring Provider: Sherrie [name] Weeks in Treatment: 1 Constitutional Sitting or standing Blood Pressure is within target range for patient.. Pulse regular and within target range for patient.Marland Kitchen Respirations regular, non-labored and within target range.. Temperature is normal and within the target range for the patient.. Patient's appearance is neat and clean. Appears in no acute distress. Well nourished and well developed.. Eyes Conjunctivae clear. No discharge.Marland Kitchen Respiratory Respiratory effort is easy and symmetric bilaterally. Rate is normal at rest and on room air.. Cardiovascular Pedal pulses palpable and strong bilaterally.. Edema in the left leg is well controlled. Lymphatic None palpable in the popliteal or inguinal area. Integumentary (Hair, Skin) She has a burn injury on the left dorsal arm. This appears to be very healthy no evidence of  infection. Psychiatric No evidence of depression, anxiety, or agitation. Calm, cooperative, and communicative. Appropriate interactions and affect.. Notes Wound exam; the patient's left medial lower leg wounds all looked better although the patient continues to complain of a lot of pain which evening keeps her up at night. There is no evidence of surrounding infection. Based on bedside exam her arterial supply seems quite adequate. ABIs in our clinic on the left leg were also within normal limits. No debridement was required Electronic Signature(s) Signed: 05/27/2016 5:13:56 PM By: [name] Najjar MD Entered By: [name] on 05/27/2016 14:49:28 [name], [name] ([id number]) -------------------------------------------------------------------------------- Physician Orders Details Patient Name: [name] Date of Service: 05/27/2016 12:30 PM Medical Record Patient Account Number: [id number] Number: Treating RN: [name] [birth year] (70 y.o. Other Clinician: Date of Birth/Sex: Female) Treating [name], [name] Primary Care Provider: Sherrie [name] Provider/Extender: G Referring Provider: Sherrie [name] Weeks in Treatment: 1 Verbal / Phone Orders: No Diagnosis Coding Wound Cleansing Wound #7 Left,Medial Lower Leg o Clean wound with Normal Saline. o Cleanse wound with mild soap and water Wound #8 Left Forearm o Clean wound with Normal Saline. o Cleanse wound with mild soap and water Anesthetic Wound #7 Left,Medial Lower Leg o Topical Lidocaine 4% cream applied to wound bed prior to debridement Wound #8 Left Forearm o Topical Lidocaine 4% cream applied to wound bed prior to debridement Primary Wound Dressing Wound #7 Left,Medial Lower Leg o Prisma Ag Wound #8 Left Forearm o Hydrogel Secondary Dressing Wound #7 Left,Medial Lower Leg o ABD pad Wound #8 Left Forearm o Kerlix and Coban - wrap lightly o Non-adherent pad Dressing Change  Frequency Wound #7 Left,Medial Lower Leg o Change dressing every week [name], [name] ([id number]) Wound #8 Left Forearm o Change dressing every day. Follow-up Appointments Wound #7  Left,Medial Lower Leg o Return Appointment in 1 week. o Nurse Visit as needed Wound #8 Left Forearm o Return Appointment in 1 week. o Nurse Visit as needed Edema Control Wound #7 Left,Medial Lower Leg o Kerlix and Coban - Left Lower Extremity o Patient to wear own compression stockings - on right leg Additional Orders / Instructions Wound #7 Left,Medial Lower Leg o Increase protein intake. o Other: - please try to keep blood sugars below 180 Wound #8 Left Forearm o Increase protein intake. o Other: - please try to keep blood sugars below 180 Electronic Signature(s) Signed: 05/27/2016 5:10:19 PM By: [name] Signed: 05/27/2016 5:13:56 PM By: [name] Najjar MD Entered By: [name] on 05/27/2016 13:15:59 [name], [name] ([id number]) -------------------------------------------------------------------------------- Problem List Details Patient Name: [name] Date of Service: 05/27/2016 12:30 PM Medical Record Patient Account Number: [id number] Number: Treating RN: [name] November 22, 1945 (70 y.o. 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 Other Clinician: Date of Birth/Sex: Female) Treating [name], [name] Primary Care Provider: Sherrie [name] Provider/Extender: G Referring Provider: Sherrie [name] Weeks in Treatment: 1 Subjective Chief Complaint Information obtained from Patient patient is here for initial evaluation of left lower extremity ulcers and a burn to the left forearm History of Present Illness (HPI) The following HPI elements were documented for the patient's wound: Location: left lower extremity and left forearm Duration: left lower extremity ulcers have been present for approximately 2 weeks (blisters formed approx 3 weeks ago) and the left forearm for less than 1 week Context: the left lower extremity ulcers are secondary to edema and spontaneous blister formation; the left forearm is secondary to a burn Pleasant 71 year old with diabetes (no hemoglobin A1c available) and severe peripheral vascular disease. She reports a prior left lower extremity stent. Records unavailable. Underwent multiple level angioplasty of left lower extremity 10/29/2014 by Dr. Wyn [name]. Developed left calf ulcerations in late December 2016. Denies any trauma. Seen by her PCP. Bactrim prescribed. Referred to the wound clinic. Biopsy culture 02/20/2015 grew methicillin sensitive staph aureus, sensitive to Bactrim. Performing dressing changes with Prisma. Using a Tubigrip for edema control. Scheduled to see Dr. Wyn [name] in follow-up 02/25/2015. Cancelled because of weather. Rescheduled for 03/06/2015. She returns to  clinic for follow-up and is without new complaints. Pain improved. No ischemic rest pain. No fever or chills. Minimal drainage. 03/13/2015 -- records obtained from [facility] shows that she was seen by [name] on 03/12/2015 and given tramadol and other symptomatic treatment. Recent ABI done showed right to be 0.94 and left to be 0.47 a left lower extremity arterial duplex was notable for an occluded stent and distal ATA. Recommendations were for left lower extremity angiogram with intervention.  on 03/11/2015 she was taken up for a aortogram with angioplasty of the left posterior tibial artery and tibioperoneal trunk, above-knee popliteal artery and almost entire SFA, stent placement to the SFA and popliteal artery. 03/20/15 her arterial interventions noted. The patient states her left leg feels better. He ischemic wound on her left anterior leg. She has been using Santyl 04/09/15; the patient arrives back today. 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 I was concerned about her last week as she had weeping edema fluid and areas of threatened ulceration around her wound. I ordered a silver alginate, Profore light wrap. She comes [name], [name]. ([id number]) back today with an Unna boot on apparently applied by [name] office. Her edema is under much better control and the weeping area here last week surrounding her major wound has resolved. She does have another open area which is small and superficial. I'm not sure I would put an Unna boot on this lady's degree of arterial insufficiency. 04/16/15; her original small wound appears to be healthy with good granulation her measurements are improved. She has a small area underneath this. Both underwent selective debridement since surface slough 04/23/15; the original wound has filled in there is healthy granulation here. The small area underneath this is healed. No debridement was  required 04/30/15; the wound no longer has any depth. no Debridement required 05/07/15; the wound appears to be stable. Advancing epithelialization. Decrease wound volume. No debridement is required. She has known arterial disease status post stent placement to the SFA and popliteal artery. 05/14/15 unfortunately the wound is not really decreased much this week she still has a rim of epithelialization. The wound bed looks stable nevertheless I did a surgical debridement and there is some adherent surface slough. She has known PAD however the blood supply appears to be adequate status post stent to the SFA and popliteal and popliteal artery 05/21/15 wound is healthier than last week. No debridement is necessary. Known PAD status post stent to the SFA and popliteal artery 05/28/15; wound is no different from last week and in fact looks "stalled". Fairly we put Iodoflex on him last week however home health used Prisma 06/04/15 if anything the wound area appears to be larger. 06/11/15 switch to silver alginate last week and this seems to have helped. There is a thin layer of epithelialization only visible under the light 06/18/15; the patient had her leg traumatized when a grandchild fell over her leg at a party last weekend. Her original wound is just about closed very tiny however she has a new larger superficial wound just distal to the original wound. No evidence of infection 06/25/15; both of the wounds on her left leg including the chronic wound we have been treating her for a prolonged period of time now, and the traumatic wound from last week both are fully epithelialized. She apparently had a slip this week and managed to lift her nail off her right great toe. 07/02/15; patient's legs are carefully inspected. Her original wound on the left medial leg is completely healed. Traumatic area on her right great toenail from last week is also healed. She has combined venous and arterial insufficiency we  have obtained her juxtalite stockings bilaterally. 07/16/15 this is a patient we recently discharged. She states that the juxtalite stocking cause irritation and blistering on her anterior left leg. She is not therefore used them in the last 6 days 07/23/15; once again the patient has no  open area here. Her edema is well controlled. She is very reluctant to try the juxtalite's again because she attributes this to the blistering on her left anterior leg. Both the medial original wound, and the blister on the anterior left leg give healed === 05/19/16- the patient is here for initial evaluation of her left lower extremity ulcers and a burn to the left forearm. She states that upon discharge last year she was given juxta light compression wraps, she could not tolerate those and was given compression stockings which she recently converted to compression socks. She states that the compression socks are "medium" and is unable to articulate the exact millimeters of mercury. She states that the left lower extremity developed spontaneous blisters which eventually ulcerated 2 weeks ago. She has not been wearing compression stockings or socks to either extremity since this occurred. She states that she burned her left forearm on a hot pan while cooking for [employer]. She has been applying no topical agents to either. She is a diabetic, she is unaware of her recent A1c but states her morning glucose was 197. She has a follow-up with her PCP, DeannaJadali this Friday. She has known mixed arterial and venous disease. 05/27/16; the patient has a burn suffered on her left arm while moving pots on her stove. She has been using [name], [name] ([id number]) hydrogel to this. She also has venous insufficiency wounds on the left lower extremity. These apparently occurred while she was wearing some form of compression although I'm really not sure how aggressive the compression was. I suspect this was inadequate to maintain  her edema control. Nevertheless wasn't really clear to me after talking to her today. Objective Constitutional Sitting or standing Blood Pressure is within target range for patient.. Pulse regular and within target range for patient.Marland Kitchen Respirations regular, non-labored and within target range.. Temperature is normal and within the target range for the patient.. Patient's appearance is neat and clean. Appears in no acute distress. Well nourished and well developed.. Vitals Time Taken: 12:39 PM, Height: 68 in, Weight: 300 lbs, BMI: 45.6, Temperature: 97.6 F, Pulse: 79 bpm, Respiratory Rate: 20 breaths/min, Blood Pressure: 142/55 mmHg. Eyes Conjunctivae clear. No discharge.Marland Kitchen Respiratory Respiratory effort is easy and symmetric bilaterally. Rate is normal at rest and on room air.. Cardiovascular Pedal pulses palpable and strong bilaterally.. Edema in the left leg is well controlled. Lymphatic None palpable in the popliteal or inguinal area. Psychiatric No evidence of depression, anxiety, or agitation. Calm, cooperative, and communicative. Appropriate interactions and affect.. General Notes: Wound exam; the patient's left medial lower leg wounds all looked better although the patient continues to complain of a lot of pain which evening keeps her up at night. There is no evidence of surrounding infection. Based on bedside exam her arterial supply seems quite adequate. ABIs in our clinic on the left leg were also within normal limits. No debridement was required Integumentary (Hair, Skin) She has a burn injury on the left dorsal arm. This appears to be very healthy no evidence of infection. Wound #7 status is Open. Original cause of wound was Blister. The wound is located on the Left,Medial Lower Leg. The wound measures 5cm length x 6cm width x 0.1cm depth; 23.562cm^2 area and 2.356cm^3 volume. The wound is limited to skin breakdown. There is no tunneling or undermining noted. There is  a [name], [name] ([id number]) large amount of serous drainage noted. The wound margin is flat and intact. There is large (67-100%) red granulation  within the wound bed. There is a small (1-33%) amount of necrotic tissue within the wound bed including Eschar and Adherent Slough. The periwound skin appearance did not exhibit: Callus, Crepitus, Excoriation, Induration, Rash, Scarring, Dry/Scaly, Maceration, Atrophie Blanche, Cyanosis, Ecchymosis, Hemosiderin Staining, Mottled, Pallor, Rubor, Erythema. Periwound temperature was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

## 2016-06-03 ENCOUNTER — Encounter: Payer: Medicare Other | Admitting: Internal Medicine

## 2016-06-03 DIAGNOSIS — M1990 Unspecified osteoarthritis, unspecified site: Secondary | ICD-10-CM | POA: Diagnosis not present

## 2016-06-03 DIAGNOSIS — I872 Venous insufficiency (chronic) (peripheral): Secondary | ICD-10-CM | POA: Diagnosis not present

## 2016-06-03 DIAGNOSIS — Z885 Allergy status to narcotic agent status: Secondary | ICD-10-CM | POA: Diagnosis not present

## 2016-06-03 DIAGNOSIS — T22012D Burn of unspecified degree of left forearm, subsequent encounter: Secondary | ICD-10-CM | POA: Diagnosis not present

## 2016-06-03 DIAGNOSIS — T22032A Burn of unspecified degree of left upper arm, initial encounter: Secondary | ICD-10-CM | POA: Diagnosis not present

## 2016-06-03 DIAGNOSIS — I87312 Chronic venous hypertension (idiopathic) with ulcer of left lower extremity: Secondary | ICD-10-CM | POA: Diagnosis not present

## 2016-06-03 DIAGNOSIS — E11622 Type 2 diabetes mellitus with other skin ulcer: Secondary | ICD-10-CM | POA: Diagnosis not present

## 2016-06-03 DIAGNOSIS — I10 Essential (primary) hypertension: Secondary | ICD-10-CM | POA: Diagnosis not present

## 2016-06-03 DIAGNOSIS — L97221 Non-pressure chronic ulcer of left calf limited to breakdown of skin: Secondary | ICD-10-CM | POA: Diagnosis not present

## 2016-06-03 DIAGNOSIS — Z880 Allergy status to penicillin: Secondary | ICD-10-CM | POA: Diagnosis not present

## 2016-06-03 DIAGNOSIS — E1151 Type 2 diabetes mellitus with diabetic peripheral angiopathy without gangrene: Secondary | ICD-10-CM | POA: Diagnosis not present

## 2016-06-03 DIAGNOSIS — L259 Unspecified contact dermatitis, unspecified cause: Secondary | ICD-10-CM | POA: Diagnosis not present

## 2016-06-03 DIAGNOSIS — Z7984 Long term (current) use of oral hypoglycemic drugs: Secondary | ICD-10-CM | POA: Diagnosis not present

## 2016-06-03 DIAGNOSIS — Z91041 Radiographic dye allergy status: Secondary | ICD-10-CM | POA: Diagnosis not present

## 2016-06-03 DIAGNOSIS — L97822 Non-pressure chronic ulcer of other part of left lower leg with fat layer exposed: Secondary | ICD-10-CM | POA: Diagnosis not present

## 2016-06-04 DIAGNOSIS — I83029 Varicose veins of left lower extremity with ulcer of unspecified site: Secondary | ICD-10-CM | POA: Diagnosis not present

## 2016-06-04 DIAGNOSIS — L97929 Non-pressure chronic ulcer of unspecified part of left lower leg with unspecified severity: Secondary | ICD-10-CM | POA: Diagnosis not present

## 2016-06-04 NOTE — Progress Notes (Signed)
MEGGIN, OLA ([id number]) Visit Report for 06/03/2016 Arrival Information Details Patient Name: [name], [name] Date of Service: 06/03/2016 3:45 PM Medical Record Patient Account Number: [id number] Number: Treating RN: Clover Mealy, RN, BSN, Rita 1945-08-28 [phone] y.o. Other Clinician: Date of Birth/Sex: Female) Treating ROBSON, MICHAEL Primary Care Lael Wetherbee: Sherrie Mustache Jashad Depaula/Extender: G Referring Franko Hilliker: Sherrie Mustache Weeks in Treatment: 2 Visit Information History Since Last Visit All ordered tests and consults were completed: No Patient Arrived: Wheel Chair Added or deleted any medications: No Arrival Time: 15:48 Any new allergies or adverse reactions: No Accompanied By: friend Had a fall or experienced change in No Transfer Assistance: None activities of daily living that may affect Patient Identification Verified: Yes risk of falls: Secondary Verification Process Yes Signs or symptoms of abuse/neglect since last No Completed: visito Patient Has Alerts: Yes Has Dressing in Place as Prescribed: Yes Patient Alerts: aspirin 81, Has Compression in Place as Prescribed: Yes plavix Pain Present Now: No DMII Electronic Signature(s) Signed: 06/03/2016 4:47:06 PM By: Elpidio Eric BSN, RN Entered By: Elpidio Eric on 06/03/2016 15:52:18 Soyars, [name] ([id number]) -------------------------------------------------------------------------------- Clinic Level of Care Assessment Details Patient Name: [name] Date of Service: 06/03/2016 3:45 PM Medical Record Patient Account Number: [id number] Number: Treating RN: Clover Mealy, RN, BSN, Rita 14-May-1945 [phone] y.o. Other Clinician: Date of Birth/Sex: Female) Treating ROBSON, MICHAEL Primary Care Laritza Vokes: Sherrie Mustache Matthe Sloane/Extender: G Referring Querida Beretta: Sherrie Mustache Weeks in Treatment: 2 Clinic Level of Care Assessment Items TOOL 4 Quantity Score  - Use when only an EandM is performed on FOLLOW-UP visit  0 ASSESSMENTS - Nursing Assessment / Reassessment X - Reassessment of Co-morbidities (includes updates in patient status) 1 10 X - Reassessment of Adherence to Treatment Plan 1 5 ASSESSMENTS - Wound and Skin Assessment / Reassessment X - Simple Wound Assessment / Reassessment - one wound 1 5  - Complex Wound Assessment / Reassessment - multiple wounds 0  - Dermatologic / Skin Assessment (not related to wound area) 0 ASSESSMENTS - Focused Assessment  - Circumferential Edema Measurements - multi extremities 0  - Nutritional Assessment / Counseling / Intervention 0 X - Lower Extremity Assessment (monofilament, tuning fork, pulses) 1 5  - Peripheral Arterial Disease Assessment (using hand held doppler) 0 ASSESSMENTS - Ostomy and/or Continence Assessment and Care  - Incontinence Assessment and Management 0  - Ostomy Care Assessment and Management (repouching, etc.) 0 PROCESS - Coordination of Care X - Simple Patient / Family Education for ongoing care 1 15  - Complex (extensive) Patient / Family Education for ongoing care 0 X - Staff obtains Chiropractor, Records, Test Results / Process Orders 1 10  - Staff telephones HHA, Nursing Homes / Clarify orders / etc 0 [name], [name] ([id number])  - Routine Transfer to another Facility (non-emergent condition) 0  - Routine Hospital Admission (non-emergent condition) 0  - New Admissions / Manufacturing engineer / Ordering NPWT, Apligraf, etc. 0  - Emergency Hospital Admission (emergent condition) 0  - Simple Discharge Coordination 0  - Complex (extensive) Discharge Coordination 0 PROCESS - Special Needs  - Pediatric / Minor Patient Management 0  - Isolation Patient Management 0  - Hearing / Language / Visual special needs 0  - Assessment of Community assistance (transportation, D/C planning, etc.) 0  - Additional assistance / Altered mentation 0  - Support Surface(s) Assessment (bed, cushion, seat, etc.)  0 INTERVENTIONS - Wound Cleansing / Measurement X - Simple Wound Cleansing - one wound 1 5  - Complex  Wound Cleansing - multiple wounds 0 X - Wound Imaging (photographs - any number of wounds) 1 5  - Wound Tracing (instead of photographs) 0 X - Simple Wound Measurement - one wound 1 5  - Complex Wound Measurement - multiple wounds 0 INTERVENTIONS - Wound Dressings X - Small Wound Dressing one or multiple wounds 1 10  - Medium Wound Dressing one or multiple wounds 0  - Large Wound Dressing one or multiple wounds 0  - Application of Medications - topical 0  - Application of Medications - injection 0 [name], [name] ([id number]) INTERVENTIONS - Miscellaneous  - External ear exam 0  - Specimen Collection (cultures, biopsies, blood, body fluids, etc.) 0  - Specimen(s) / Culture(s) sent or taken to Lab for analysis 0  - Patient Transfer (multiple staff / Michiel Sites Lift / Similar devices) 0  - Simple Staple / Suture removal (25 or less) 0  - Complex Staple / Suture removal (26 or more) 0  - Hypo / Hyperglycemic Management (close monitor of Blood Glucose) 0  - Ankle / Brachial Index (ABI) - do not check if billed separately 0 X - Vital Signs 1 5 Has the patient been seen at the hospital within the last three years: Yes Total Score: 80 Level Of Care: New/Established - Level 3 Electronic Signature(s) Signed: 06/03/2016 4:47:06 PM By: Elpidio Eric BSN, RN Entered By: Elpidio Eric on 06/03/2016 16:36:56 [name], [name] -------------------------------------------------------------------------------- Encounter Discharge Information Details Patient Name: [name] Date of Service: 06/03/2016 3:45 PM Medical Record Patient Account Number: [id number] Number: Treating RN: Clover Mealy, RN, BSN, Rita Jul 06, 1945 [phone] y.o. Other Clinician: Date of Birth/Sex: Female) Treating ROBSON, MICHAEL Primary Care Maddalena Linarez: Sherrie Mustache Drucilla Cumber/Extender:  G Referring Torryn Fiske: Augustin Coupe in Treatment: 2 Encounter Discharge Information Items Discharge Pain Level: 0 Discharge Condition: Stable Ambulatory Status: Wheelchair Discharge Destination: Home Transportation: Private Auto Accompanied By: friend Schedule Follow-up Appointment: No Medication Reconciliation completed and provided to Patient/Care No Venie Montesinos: Provided on Clinical Summary of Care: 06/03/2016 Form Type Recipient Paper Patient MW Electronic Signature(s) Signed: 06/03/2016 4:35:49 PM By: Elpidio Eric BSN, RN Previous Signature: 06/03/2016 4:23:32 PM Version By: Gwenlyn Perking Entered By: Elpidio Eric on 06/03/2016 16:35:49 Flud, [name] ([id number]) -------------------------------------------------------------------------------- Lower Extremity Assessment Details Patient Name: [name] Date of Service: 06/03/2016 3:45 PM Medical Record Patient Account Number: [id number] Number: Treating RN: Clover Mealy RN, BSN, Rita [birth year] [phone] y.o. Other Clinician: Date of Birth/Sex: Female) Treating ROBSON, MICHAEL Primary Care Dede Dobesh: Sherrie Mustache Nile Dorning/Extender: G Referring Palmer Fahrner: Sherrie Mustache Weeks in Treatment: 2 Edema Assessment Assessed: [Left: No] [Right: No] E[Left: dema] [Right: :] Calf Left: Right: Point of Measurement: 32 cm From Medial Instep 38.7 cm cm Ankle Left: Right: Point of Measurement: 12 cm From Medial Instep 24.6 cm cm Vascular Assessment Claudication: Claudication Assessment [Left:None] Pulses: Dorsalis Pedis Palpable: [Left:Yes] Posterior Tibial Extremity colors, hair growth, and conditions: Extremity Color: [Left:Mottled] Hair Growth on Extremity: [Left:No] Temperature of Extremity: [Left:Warm] Capillary Refill: [Left:< 3 seconds] Toe Nail Assessment Left: Right: Thick: Yes Discolored: Yes Deformed: Yes Improper Length and Hygiene: Yes Electronic Signature(s) Signed: 06/03/2016 4:47:06 PM By: Elpidio Eric BSN, RN State, [place] ([id number]) Entered By: Elpidio Eric on 06/03/2016 16:12:43 Rake, [name] ([id number]) -------------------------------------------------------------------------------- Multi Wound Chart Details Patient Name: [name] Date of Service: 06/03/2016 3:45 PM Medical Record Patient Account Number: [id number] Number: Treating RN: Clover Mealy, RN, BSN, Rita Oct 20, 1945 [phone] y.o. Other Clinician: Date of Birth/Sex: Female) Treating  Baltazar Najjar Primary Care Jakel Alphin: Sherrie Mustache Leonel Mccollum/Extender: G Referring Bellami Farrelly: Sherrie Mustache Weeks in Treatment: 2 Vital Signs Height(in): 68 Pulse(bpm): 78 Weight(lbs): 300 Blood Pressure 116/86 (mmHg): Body Mass Index(BMI): 46 Temperature(F): 98.3 Respiratory Rate 18 (breaths/min): Photos: [N/A:N/A] Wound Location: Left Lower Leg - Medial Left Forearm N/A Wounding Event: Blister Thermal Burn N/A Primary Etiology: Venous Leg Ulcer 2nd degree Burn N/A Comorbid History: Hypertension, Peripheral Hypertension, Peripheral N/A Arterial Disease, Type II Arterial Disease, Type II Diabetes, Osteoarthritis, Diabetes, Osteoarthritis, Neuropathy Neuropathy Date Acquired: 05/04/2016 05/11/2016 N/A Weeks of Treatment: 2 2 N/A Wound Status: Open Open N/A Clustered Wound: Yes No N/A Clustered Quantity: 5 N/A N/A Measurements L x W x D 2x0.5x0.1 0x0x0 N/A (cm) Area (cm) : 0.785 0 N/A Volume (cm) : 0.079 0 N/A % Reduction in Area: 97.80% 100.00% N/A % Reduction in Volume: 97.80% 100.00% N/A Classification: Partial Thickness Partial Thickness N/A HBO Classification: Grade 1 N/A N/A Exudate Amount: Small None Present N/A JOZALYN, BAGLIO. ([id number]) Exudate Type: Serous N/A N/A Exudate Color: amber N/A N/A Wound Margin: Flat and Intact Flat and Intact N/A Granulation Amount: Large (67-100%) Large (67-100%) N/A Granulation Quality: Red, Friable Pink, Friable N/A Necrotic Amount: None Present (0%) None Present  (0%) N/A Exposed Structures: Fascia: No Fascia: No N/A Fat Layer (Subcutaneous Fat Layer (Subcutaneous Tissue) Exposed: No Tissue) Exposed: No Tendon: No Tendon: No Muscle: No Muscle: No Joint: No Joint: No Bone: No Bone: No Limited to Skin Limited to Skin Breakdown Breakdown Epithelialization: Large (67-100%) Large (67-100%) N/A Periwound Skin Texture: Excoriation: No Excoriation: No N/A Induration: No Induration: No Callus: No Callus: No Crepitus: No Crepitus: No Rash: No Rash: No Scarring: No Scarring: No Periwound Skin Maceration: No Dry/Scaly: Yes N/A Moisture: Dry/Scaly: No Maceration: No Periwound Skin Color: Atrophie Blanche: No Atrophie Blanche: No N/A Cyanosis: No Cyanosis: No Ecchymosis: No Ecchymosis: No Erythema: No Erythema: No Hemosiderin Staining: No Hemosiderin Staining: No Mottled: No Mottled: No Pallor: No Pallor: No Rubor: No Rubor: No Temperature: No Abnormality No Abnormality N/A Tenderness on Yes No N/A Palpation: Wound Preparation: Ulcer Cleansing: Other: Ulcer Cleansing: N/A soap and water Rinsed/Irrigated with Saline Topical Anesthetic Applied: Other: lidocaine 4% Treatment Notes Wound #7 (Left, Medial Lower Leg) 1. Cleansed with: Cleanse wound with antibacterial soap and water 3. Peri-wound Care: Barrier cream Moisturizing lotion [name], [name] ([id number]) 4. Dressing Applied: Prisma Ag 5. Secondary Dressing Applied ABD Pad Kerlix/Conform 7. Secured with Tape Notes kerlix and coban wrap with unna to anchor Electronic Signature(s) Signed: 06/03/2016 5:12:22 PM By: Baltazar Najjar MD Previous Signature: 06/03/2016 4:47:06 PM Version By: Elpidio Eric BSN, RN Entered By: Baltazar Najjar on 06/03/2016 17:06:43 [name], [name] ([id number]) -------------------------------------------------------------------------------- Multi-Disciplinary Care Plan Details Patient Name: [name] Date of Service: 06/03/2016 3:45  PM Medical Record Patient Account Number: [id number] Number: Treating RN: Clover Mealy RN, BSN, Rita May 20, 1945 [phone] y.o. Other Clinician: Date of Birth/Sex: Female) Treating ROBSON, MICHAEL Primary Care Paras Kreider: Sherrie Mustache Temica Righetti/Extender: G Referring Zakya Halabi: Sherrie Mustache Weeks in Treatment: 2 Active Inactive ` Abuse / Safety / Falls / Self Care Management Nursing Diagnoses: Potential for falls Goals: Patient will remain injury free Date Initiated: 05/19/2016 Target Resolution Date: 08/21/2016 Goal Status: Active Interventions: Assess fall risk on admission and as needed Notes: ` Orientation to the Wound Care Program Nursing Diagnoses: Knowledge deficit related to the wound healing center program Goals: Patient/caregiver will verbalize understanding of the Wound Healing Center Program Date Initiated: 05/19/2016 Target Resolution Date: 08/21/2016 Goal Status:  Active Interventions: Provide education on orientation to the wound center Notes: ` Venous Leg Ulcer Nursing Diagnoses: GRACEE, RATTERREE ([id number]) Actual venous Insuffiency (use after diagnosis is confirmed) Goals: Patient will maintain optimal edema control Date Initiated: 05/19/2016 Target Resolution Date: 08/21/2016 Goal Status: Active Interventions: Assess peripheral edema status every visit. Compression as ordered Notes: ` Wound/Skin Impairment Nursing Diagnoses: Impaired tissue integrity Goals: Patient/caregiver will verbalize understanding of skin care regimen Date Initiated: 05/19/2016 Target Resolution Date: 08/21/2016 Goal Status: Active Ulcer/skin breakdown will have a volume reduction of 30% by week 4 Date Initiated: 05/19/2016 Target Resolution Date: 08/21/2016 Goal Status: Active Ulcer/skin breakdown will have a volume reduction of 50% by week 8 Date Initiated: 05/19/2016 Target Resolution Date: 08/21/2016 Goal Status: Active Ulcer/skin breakdown will have a volume reduction of 80% by week  12 Date Initiated: 05/19/2016 Target Resolution Date: 08/21/2016 Goal Status: Active Ulcer/skin breakdown will heal within 14 weeks Date Initiated: 05/19/2016 Target Resolution Date: 08/21/2016 Goal Status: Active Interventions: Assess patient/caregiver ability to obtain necessary supplies Assess patient/caregiver ability to perform ulcer/skin care regimen upon admission and as needed Assess ulceration(s) every visit Notes: Electronic Signature(s) Signed: 06/03/2016 4:47:06 PM By: Elpidio Eric BSN, RN [name], [name] Entered By: Elpidio Eric on 06/03/2016 16:12:52 [name], [name] ([id number]) -------------------------------------------------------------------------------- Pain Assessment Details Patient Name: [name] Date of Service: 06/03/2016 3:45 PM Medical Record Patient Account Number: [id number] Number: Treating RN: Clover Mealy, RN, BSN, Rita March 01, 1945 [phone] y.o. Other Clinician: Date of Birth/Sex: Female) Treating ROBSON, MICHAEL Primary Care Kevante Lunt: Sherrie Mustache Jaasiel [place]/Extender: G Referring Lynda Wanninger: Sherrie Mustache Weeks in Treatment: 2 Active Problems Location of Pain Severity and Description of Pain Patient Has Paino No Site Locations With Dressing Change: No Pain Management and Medication Current Pain Management: Electronic Signature(s) Signed: 06/03/2016 4:47:06 PM By: Elpidio Eric BSN, RN Entered By: Elpidio Eric on 06/03/2016 15:52:25 [name], [name] ([id number]) -------------------------------------------------------------------------------- Patient/Caregiver Education Details Patient Name: [name] Date of Service: 06/03/2016 3:45 PM Medical Record Patient Account Number: [id number] Number: Treating RN: Clover Mealy, RN, BSN, Rita 1945/05/10 [phone] y.o. Other Clinician: Date of Birth/Gender: Female) Treating ROBSON, MICHAEL Primary Care Physician: Sherrie Mustache Physician/Extender: G Referring Physician: Augustin Coupe in  Treatment: 2 Education Assessment Education Provided To: Patient Education Topics Provided Welcome To The Wound Care Center: Methods: Explain/Verbal Responses: State content correctly Wound/Skin Impairment: Methods: Explain/Verbal Responses: State content correctly Electronic Signature(s) Signed: 06/03/2016 4:47:06 PM By: Elpidio Eric BSN, RN Entered By: Elpidio Eric on 06/03/2016 16:36:07 [name], [name] ([id number]) -------------------------------------------------------------------------------- Wound Assessment Details Patient Name: [name] Date of Service: 06/03/2016 3:45 PM Medical Record Patient Account Number: [id number] Number: Treating RN: Clover Mealy RN, BSN, Rita [birth year] [phone] y.o. Other Clinician: Date of Birth/Sex: Female) Treating ROBSON, MICHAEL Primary Care Serita Degroote: Sherrie Mustache Naomee Nowland/Extender: G Referring Darlyn Repsher: Sherrie Mustache Weeks in Treatment: 2 Wound Status Wound Number: 7 Primary Venous Leg Ulcer Etiology: Wound Location: Left Lower Leg - Medial Wound Open Wounding Event: Blister Status: Date Acquired: 05/04/2016 Comorbid Hypertension, Peripheral Arterial Weeks Of Treatment: 2 History: Disease, Type II Diabetes, Clustered Wound: Yes Osteoarthritis, Neuropathy Photos Photo Uploaded By: Elpidio Eric on 06/03/2016 16:46:16 Wound Measurements Length: (cm) 2 Width: (cm) 0.5 Depth: (cm) 0.1 Clustered Quantity: 5 Area: (cm) 0.785 Volume: (cm) 0.079 % Reduction in Area: 97.8% % Reduction in Volume: 97.8% Epithelialization: Large (67-100%) Tunneling: No Undermining: No Wound Description Classification: Partial Thickness Foul Odor Aft Diabetic Severity (Wagner): Grade 1 Slough/Fibrin Wound Margin: Flat and Intact Exudate Amount:  Small Exudate Type: Serous Exudate Color: amber er Cleansing: No o No Wound Bed [name], [name] ([id number]) Granulation Amount: Large (67-100%) Exposed Structure Granulation Quality: Red,  Friable Fascia Exposed: No Necrotic Amount: None Present (0%) Fat Layer (Subcutaneous Tissue) Exposed: No Tendon Exposed: No Muscle Exposed: No Joint Exposed: No Bone Exposed: No Limited to Skin Breakdown Periwound Skin Texture Texture Color No Abnormalities Noted: No No Abnormalities Noted: No Callus: No Atrophie Blanche: No Crepitus: No Cyanosis: No Excoriation: No Ecchymosis: No Induration: No Erythema: No Rash: No Hemosiderin Staining: No Scarring: No Mottled: No Pallor: No Moisture Rubor: No No Abnormalities Noted: No Dry / Scaly: No Temperature / Pain Maceration: No Temperature: No Abnormality Tenderness on Palpation: Yes Wound Preparation Ulcer Cleansing: Other: soap and water, Topical Anesthetic Applied: Other: lidocaine 4%, Treatment Notes Wound #7 (Left, Medial Lower Leg) 1. Cleansed with: Cleanse wound with antibacterial soap and water 3. Peri-wound Care: Barrier cream Moisturizing lotion 4. Dressing Applied: Prisma Ag 5. Secondary Dressing Applied ABD Pad Kerlix/Conform 7. Secured with Tape Notes kerlix and coban wrap with unna to [employer]) [name], VERDEJO ([id number]) Signed: 06/03/2016 4:47:06 PM By: Elpidio Eric BSN, RN Entered By: Elpidio Eric on 06/03/2016 16:00:21 CHRISTOPHER, HINK ([id number]) -------------------------------------------------------------------------------- Wound Assessment Details Patient Name: [name] Date of Service: 06/03/2016 3:45 PM Medical Record Patient Account Number: [id number] Number: Treating RN: Clover Mealy RN, BSN, Rita 04-08-1945 [phone] y.o. Other Clinician: Date of Birth/Sex: Female) Treating ROBSON, MICHAEL Primary Care Casady Voshell: Sherrie Mustache Avah Bashor/Extender: G Referring Harlym Gehling: Sherrie Mustache Weeks in Treatment: 2 Wound Status Wound Number: 8 Primary 2nd degree Burn Etiology: Wound Location: Left Forearm Wound Open Wounding Event: Thermal Burn Status: Date  Acquired: 05/11/2016 Comorbid Hypertension, Peripheral Arterial Weeks Of Treatment: 2 History: Disease, Type II Diabetes, Clustered Wound: No Osteoarthritis, Neuropathy Photos Photo Uploaded By: Elpidio Eric on 06/03/2016 16:46:35 Wound Measurements Length: (cm) 0 % Reduction in Width: (cm) 0 % Reduction in Depth: (cm) 0 Epithelializat Area: (cm) 0 Tunneling: Volume: (cm) 0 Undermining: Area: 100% Volume: 100% ion: Large (67-100%) No No Wound Description Classification: Partial Thickness Foul Odor Aft Wound Margin: Flat and Intact Slough/Fibrin Exudate Amount: None Present er Cleansing: No o No Wound Bed Granulation Amount: Large (67-100%) Exposed Structure Granulation Quality: Pink, Friable Fascia Exposed: No Necrotic Amount: None Present (0%) Fat Layer (Subcutaneous Tissue) Exposed: No Tendon Exposed: No Payton, Arianny N. ([id number]) Muscle Exposed: No Joint Exposed: No Bone Exposed: No Limited to Skin Breakdown Periwound Skin Texture Texture Color No Abnormalities Noted: No No Abnormalities Noted: No Callus: No Atrophie Blanche: No Crepitus: No Cyanosis: No Excoriation: No Ecchymosis: No Induration: No Erythema: No Rash: No Hemosiderin Staining: No Scarring: No Mottled: No Pallor: No Moisture Rubor: No No Abnormalities Noted: No Dry / Scaly: Yes Temperature / Pain Maceration: No Temperature: No Abnormality Wound Preparation Ulcer Cleansing: Rinsed/Irrigated with Saline Electronic Signature(s) Signed: 06/03/2016 4:47:06 PM By: Elpidio Eric BSN, RN Entered By: Elpidio Eric on 06/03/2016 16:00:52 Craun, [name] ([id number]) -------------------------------------------------------------------------------- Vitals Details Patient Name: [name] Date of Service: 06/03/2016 3:45 PM Medical Record Patient Account Number: [id number] Number: Treating RN: Clover Mealy, RN, BSN, Rita 06-08-1945 [phone] y.o. Other Clinician: Date of Birth/Sex: Female)  Treating ROBSON, MICHAEL Primary Care Markos Theil: Sherrie Mustache Tiffiney Sparrow/Extender: G Referring Annaelle Kasel: Sherrie Mustache Weeks in Treatment: 2 Vital Signs Time Taken: 15:52 Temperature (F): 98.3 Height (in): 68 Pulse (bpm): 78 Weight (lbs): 300 Respiratory Rate (breaths/min): 18 Body Mass Index (BMI): 45.6 Blood Pressure (mmHg): 116/86  Reference Range: 80 - 120 mg / dl Electronic Signature(s) Signed: 06/03/2016 4:47:06 PM By: Elpidio Eric BSN, RN Entered By: Elpidio Eric on 06/03/2016 15:52:50

## 2016-06-04 NOTE — Progress Notes (Signed)
[name] ([id number]) Visit Report for 06/03/2016 Chief Complaint Document Details Patient Name: [name], [name] Date of Service: 06/03/2016 3:45 PM Medical Record Patient Account Number: [id number] Number: Treating RN: Clover Mealy, RN, BSN, Rita 05/15/1945 [phone] y.o. Other Clinician: Date of Birth/Sex: Female) Treating Alvah Lagrow Primary Care Provider: Sherrie Mustache Provider/Extender: G Referring Provider: Sherrie Mustache Weeks in Treatment: 2 Information Obtained from: Patient Chief Complaint patient is here for initial evaluation of left lower extremity ulcers and a burn to the left forearm Electronic Signature(s) Signed: 06/03/2016 5:12:22 PM By: Baltazar Najjar MD Entered By: Baltazar Najjar on 06/03/2016 17:06:51 [place], [name] ([id number]) -------------------------------------------------------------------------------- HPI Details Patient Name: [name] Date of Service: 06/03/2016 3:45 PM Medical Record Patient Account Number: [id number] Number: Treating RN: Clover Mealy RN, BSN, Rita 1945-06-01 [phone] y.o. Other Clinician: Date of Birth/Sex: Female) Treating Rayvion Stumph Primary Care Provider: Sherrie Mustache Provider/Extender: G Referring Provider: Sherrie Mustache Weeks in Treatment: 2 History of Present Illness Location: left lower extremity and left forearm Duration: left lower extremity ulcers have been present for approximately 2 weeks (blisters formed approx 3 weeks ago) and the left forearm for less than 1 week Context: the left lower extremity ulcers are secondary to edema and spontaneous blister formation; the left forearm is secondary to a burn HPI Description: Pleasant 71 year old with diabetes (no hemoglobin A1c available) and severe peripheral vascular disease. She reports a prior left lower extremity stent. Records unavailable. Underwent multiple level angioplasty of left lower extremity 10/29/2014 by Dr. Wyn Quaker. Developed left calf  ulcerations in late December 2016. Denies any trauma. Seen by her PCP. Bactrim prescribed. Referred to the wound clinic. Biopsy culture 02/20/2015 grew methicillin sensitive staph aureus, sensitive to Bactrim. Performing dressing changes with Prisma. Using a Tubigrip for edema control. Scheduled to see Dr. Wyn Quaker in follow-up 02/25/2015. Cancelled because of weather. Rescheduled for 03/06/2015. She returns to clinic for follow-up and is without new complaints. Pain improved. No ischemic rest pain. No fever or chills. Minimal drainage. 03/13/2015 -- records obtained from [facility] shows that she was seen by Dr. dew on 03/12/2015 and given tramadol and other symptomatic treatment. Recent ABI done showed right to be 0.94 and left to be 0.47 a left lower extremity arterial duplex was notable for an occluded stent and distal ATA. Recommendations were for left lower extremity angiogram with intervention. on 03/11/2015 she was taken up for a aortogram with angioplasty of the left posterior tibial artery and tibioperoneal trunk, above-knee popliteal artery and almost entire SFA, stent placement to the SFA and popliteal artery. 03/20/15 her arterial interventions noted. The patient states her left leg feels better. He ischemic wound on her left anterior leg. She has been using Santyl 04/09/15; the patient arrives back today. 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 dew. I was concerned about her last week as she had weeping edema fluid and areas of threatened ulceration around her wound. I ordered a silver alginate, Profore light wrap. She comes back today with an Radio broadcast assistant on apparently applied by Dr. Driscilla Grammes office. Her edema is under much better control and the weeping area here last week surrounding her major wound has resolved. She does have another open area which is small and superficial. I'm not sure I would put an Unna boot on this  lady's degree of arterial insufficiency. 04/16/15; her original small wound appears to be healthy with good granulation her measurements are improved. She has a small  area underneath this. Both underwent selective debridement since surface slough [name], [name] ([id number]) 04/23/15; the original wound has filled in there is healthy granulation here. The small area underneath this is healed. No debridement was required 04/30/15; the wound no longer has any depth. no Debridement required 05/07/15; the wound appears to be stable. Advancing epithelialization. Decrease wound volume. No debridement is required. She has known arterial disease status post stent placement to the SFA and popliteal artery. 05/14/15 unfortunately the wound is not really decreased much this week she still has a rim of epithelialization. The wound bed looks stable nevertheless I did a surgical debridement and there is some adherent surface slough. She has known PAD however the blood supply appears to be adequate status post stent to the SFA and popliteal and popliteal artery 05/21/15 wound is healthier than last week. No debridement is necessary. Known PAD status post stent to the SFA and popliteal artery 05/28/15; wound is no different from last week and in fact looks "stalled". Fairly we put Iodoflex on him last week however home health used Prisma 06/04/15 if anything the wound area appears to be larger. 06/11/15 switch to silver alginate last week and this seems to have helped. There is a thin layer of epithelialization only visible under the light 06/18/15; the patient had her leg traumatized when a grandchild fell over her leg at a party last weekend. Her original wound is just about closed very tiny however she has a new larger superficial wound just distal to the original wound. No evidence of infection 06/25/15; both of the wounds on her left leg including the chronic wound we have been treating her for a prolonged period of  time now, and the traumatic wound from last week both are fully epithelialized. She apparently had a slip this week and managed to lift her nail off her right great toe. 07/02/15; patient's legs are carefully inspected. Her original wound on the left medial leg is completely healed. Traumatic area on her right great toenail from last week is also healed. She has combined venous and arterial insufficiency we have obtained her juxtalite stockings bilaterally. 07/16/15 this is a patient we recently discharged. She states that the juxtalite stocking cause irritation and blistering on her anterior left leg. She is not therefore used them in the last 6 days 07/23/15; once again the patient has no open area here. Her edema is well controlled. She is very reluctant to try the juxtalite's again because she attributes this to the blistering on her left anterior leg. Both the medial original wound, and the blister on the anterior left leg give healed === 05/19/16- the patient is here for initial evaluation of her left lower extremity ulcers and a burn to the left forearm. She states that upon discharge last year she was given juxta light compression wraps, she could not tolerate those and was given compression stockings which she recently converted to compression socks. She states that the compression socks are "medium" and is unable to articulate the exact millimeters of mercury. She states that the left lower extremity developed spontaneous blisters which eventually ulcerated 2 weeks ago. She has not been wearing compression stockings or socks to either extremity since this occurred. She states that she burned her left forearm on a hot pan while cooking for [employer]. She has been applying no topical agents to either. She is a diabetic, she is unaware of her recent A1c but states her morning glucose was 197. She has a  follow-up with her PCP, Dr.Jadali this Friday. She has known mixed arterial and venous  disease. 05/27/16; the patient has a burn suffered on her left arm while moving pots on her stove. She has been using hydrogel to this. She also has venous insufficiency wounds on the left lower extremity. These apparently occurred while she was wearing some form of compression although I'm really not sure how aggressive the compression was. I suspect this was inadequate to maintain her edema control. Nevertheless wasn't really clear to me after talking to her today. 06/03/16; the patient's burn injury on her left arm is fully resolved. Venous insufficiency wound in the left lower extremity also looks considerably better under compression. In discussion today and is not really [name], [name]. ([id number]) clear what stocking she had at home. She did have juxtalite stockings but this caused a severe contact dermatitis type rash therefore she is not eligible for this which is unfortunate Electronic Signature(s) Signed: 06/03/2016 5:12:22 PM By: Baltazar Najjar MD Entered By: Baltazar Najjar on 06/03/2016 17:07:38 [name], [name] -------------------------------------------------------------------------------- Physical Exam Details Patient Name: [name] Date of Service: 06/03/2016 3:45 PM Medical Record Patient Account Number: [id number] Number: Treating RN: Clover Mealy, RN, BSN, Rita 12/14/1945 [phone] y.o. Other Clinician: Date of Birth/Sex: Female) Treating Senya Hinzman Primary Care Provider: Sherrie Mustache Provider/Extender: G Referring Provider: Sherrie Mustache Weeks in Treatment: 2 Constitutional Sitting or standing Blood Pressure is within target range for patient.. Pulse regular and within target range for patient.Marland Kitchen Respirations regular, non-labored and within target range.. Temperature is normal and within the target range for the patient.. Patient's appearance is neat and clean. Appears in no acute distress. Well nourished and well  developed.Marland Kitchen Respiratory Respiratory effort is easy and symmetric bilaterally. Rate is normal at rest and on room air.. Cardiovascular Pedal pulses palpable and strong bilaterally.. Lymphatic None palpable popliteal erring on all area. Psychiatric No evidence of depression, anxiety, or agitation. Calm, cooperative, and communicative. Appropriate interactions and affect.. Notes Would exam; the patient's left medial leg wound almost is fully epithelialized. I'm going to wrap this again this week and this should be healed by next week. oThe area on her left arm is already healed and we have declared itself today Electronic Signature(s) Signed: 06/03/2016 5:12:22 PM By: Baltazar Najjar MD Entered By: Baltazar Najjar on 06/03/2016 17:10:11 [name], [name] ([id number]) -------------------------------------------------------------------------------- Physician Orders Details Patient Name: [name] Date of Service: 06/03/2016 3:45 PM Medical Record Patient Account Number: [id number] Number: Treating RN: Clover Mealy RN, BSN, Rita 1945/04/26 [phone] y.o. Other Clinician: Date of Birth/Sex: Female) Treating Khaleed Holan Primary Care Provider: Sherrie Mustache Provider/Extender: G Referring Provider: Sherrie Mustache Weeks in Treatment: 2 Verbal / Phone Orders: No Diagnosis Coding Wound Cleansing Wound #7 Left,Medial Lower Leg o Clean wound with Normal Saline. o Cleanse wound with mild soap and water Wound #8 Left Forearm o Clean wound with Normal Saline. o Cleanse wound with mild soap and water Anesthetic Wound #7 Left,Medial Lower Leg o Topical Lidocaine 4% cream applied to wound bed prior to debridement Wound #8 Left Forearm o Topical Lidocaine 4% cream applied to wound bed prior to debridement Primary Wound Dressing Wound #7 Left,Medial Lower Leg o Prisma Ag Wound #8 Left Forearm o Hydrogel Secondary Dressing Wound #7 Left,Medial Lower Leg o ABD  pad Wound #8 Left Forearm o Kerlix and Coban - wrap lightly o Non-adherent pad Dressing Change Frequency Wound #7 Left,Medial Lower Leg o Change dressing every week [name], [name] (  [id number]) Wound #8 Left Forearm o Change dressing every day. Follow-up Appointments Wound #7 Left,Medial Lower Leg o Return Appointment in 1 week. o Nurse Visit as needed Wound #8 Left Forearm o Return Appointment in 1 week. o Nurse Visit as needed Edema Control Wound #7 Left,Medial Lower Leg o Kerlix and Coban - Left Lower Extremity o Patient to wear own compression stockings - on right leg Additional Orders / Instructions Wound #7 Left,Medial Lower Leg o Increase protein intake. o Other: - please try to keep blood sugars below 180 Wound #8 Left Forearm o Increase protein intake. o Other: - please try to keep blood sugars below 180 Electronic Signature(s) Signed: 06/03/2016 4:47:06 PM By: Elpidio Eric BSN, RN Signed: 06/03/2016 5:12:22 PM By: Baltazar Najjar MD Entered By: Elpidio Eric on 06/03/2016 16:22:35 [name], [name] -------------------------------------------------------------------------------- Problem List Details Patient Name: [name] Date of Service: 06/03/2016 3:45 PM Medical Record Patient Account Number: [id number] Number: Treating RN: Clover Mealy RN, BSN, Rita [birth year] [phone] y.o. 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 T22.032A Burn of  unspecified degree of left upper arm, initial 05/19/2016 Yes encounter Inactive Problems Resolved Problems Electronic Signature(s) Signed: 06/03/2016 5:12:22 PM By: Baltazar Najjar MD Entered By: Baltazar Najjar on 06/03/2016 17:06:29 [name], [name] ([id number]) -------------------------------------------------------------------------------- Progress Note Details Patient Name: [name] Date of Service: 06/03/2016 3:45 PM Medical Record Patient Account Number: [id number] Number: Treating RN: Clover Mealy, RN, BSN, Rita 1945-06-01 [phone] y.o. Other Clinician: Date of Birth/Sex: Female) Treating Breckyn Ticas Primary Care Provider: Sherrie Mustache Provider/Extender: G Referring Provider: Sherrie Mustache Weeks in Treatment: 2 Subjective Chief Complaint Information obtained from Patient patient is here for initial evaluation of left lower extremity ulcers and a burn to the left forearm History of Present Illness (HPI) The following HPI elements were documented for the patient's wound: Location: left lower extremity and left forearm Duration: left lower extremity ulcers have been present for approximately 2 weeks (blisters formed approx 3 weeks ago) and the left forearm for less than 1 week Context: the left lower extremity ulcers are secondary to edema and spontaneous blister formation; the left forearm is secondary to a burn Pleasant 71 year old with diabetes (no hemoglobin A1c available) and severe peripheral vascular disease. She reports a prior left lower extremity stent. Records unavailable. Underwent multiple level angioplasty of left lower extremity 10/29/2014 by Dr. Wyn Quaker. Developed left calf ulcerations in late December 2016. Denies any trauma. Seen by her PCP. Bactrim prescribed. Referred to the wound clinic. Biopsy culture 02/20/2015 grew methicillin sensitive staph aureus, sensitive to Bactrim. Performing dressing changes with Prisma. Using a Tubigrip for edema  control. Scheduled to see Dr. Wyn Quaker in follow-up 02/25/2015. Cancelled because of weather. Rescheduled for 03/06/2015. She returns to clinic for follow-up and is without new complaints. Pain improved. No ischemic rest pain. No fever or chills. Minimal drainage. 03/13/2015 -- records obtained from [facility] shows that she was seen by Dr. dew on 03/12/2015 and given tramadol and other symptomatic treatment. Recent ABI done showed right to be 0.94 and left to be 0.47 a left lower extremity  arterial duplex was notable for an occluded stent and distal ATA. Recommendations were for left lower extremity angiogram with intervention. on 03/11/2015 she was taken up for a aortogram with angioplasty of the left posterior tibial artery and tibioperoneal trunk, above-knee popliteal artery and almost entire SFA, stent placement to the SFA and popliteal artery. 03/20/15 her arterial interventions noted. The patient states her left leg feels better. He ischemic wound on her left anterior leg. She has been using Santyl 04/09/15; the patient arrives back today. 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 dew. I was concerned about her last week as she had weeping edema fluid and areas of threatened ulceration around her wound. I ordered a silver alginate, Profore light wrap. She comes [name], [name]. ([id number]) back today with an Unna boot on apparently applied by Dr. Driscilla Grammes office. Her edema is under much better control and the weeping area here last week surrounding her major wound has resolved. She does have another open area which is small and superficial. I'm not sure I would put an Unna boot on this lady's degree of arterial insufficiency. 04/16/15; her original small wound appears to be healthy with good granulation her measurements are improved. She has a small area underneath this. Both underwent selective debridement since  surface slough 04/23/15; the original wound has filled in there is healthy granulation here. The small area underneath this is healed. No debridement was required 04/30/15; the wound no longer has any depth. no Debridement required 05/07/15; the wound appears to be stable. Advancing epithelialization. Decrease wound volume. No debridement is required. She has known arterial disease status post stent placement to the SFA and popliteal artery. 05/14/15 unfortunately the wound is not really decreased much this week she still has a rim of epithelialization. The wound bed looks stable nevertheless I did a surgical debridement and there is some adherent surface slough. She has known PAD however the blood supply appears to be adequate status post stent to the SFA and popliteal and popliteal artery 05/21/15 wound is healthier than last week. No debridement is necessary. Known PAD status post stent to the SFA and popliteal artery 05/28/15; wound is no different from last week and in fact looks "stalled". Fairly we put Iodoflex on him last week however home health used Prisma 06/04/15 if anything the wound area appears to be larger. 06/11/15 switch to silver alginate last week and this seems to have helped. There is a thin layer of epithelialization only visible under the light 06/18/15; the patient had her leg traumatized when a grandchild fell over her leg at a party last weekend. Her original wound is just about closed very tiny however she has a new larger superficial wound just distal to the original wound. No evidence of infection 06/25/15; both of the wounds on her left leg including the chronic wound we have been treating her for a prolonged period of time now, and the traumatic wound from last week both are fully epithelialized. She apparently had a slip this week and managed to lift her nail off her right great toe. 07/02/15; patient's legs are carefully inspected. Her original wound on the left medial leg  is completely healed. Traumatic area on her right great toenail from last week is also healed. She has combined venous and arterial insufficiency we have obtained her juxtalite stockings bilaterally. 07/16/15 this is a patient we recently discharged. She states that the juxtalite stocking cause irritation and blistering on her anterior  left leg. She is not therefore used them in the last 6 days 07/23/15; once again the patient has no open area here. Her edema is well controlled. She is very reluctant to try the juxtalite's again because she attributes this to the blistering on her left anterior leg. Both the medial original wound, and the blister on the anterior left leg give healed === 05/19/16- the patient is here for initial evaluation of her left lower extremity ulcers and a burn to the left forearm. She states that upon discharge last year she was given juxta light compression wraps, she could not tolerate those and was given compression stockings which she recently converted to compression socks. She states that the compression socks are "medium" and is unable to articulate the exact millimeters of mercury. She states that the left lower extremity developed spontaneous blisters which eventually ulcerated 2 weeks ago. She has not been wearing compression stockings or socks to either extremity since this occurred. She states that she burned her left forearm on a hot pan while cooking for [employer]. She has been applying no topical agents to either. She is a diabetic, she is unaware of her recent A1c but states her morning glucose was 197. She has a follow-up with her PCP, Dr.Jadali this Friday. She has known mixed arterial and venous disease. 05/27/16; the patient has a burn suffered on her left arm while moving pots on her stove. She has been using [name], [name] ([id number]) hydrogel to this. She also has venous insufficiency wounds on the left lower extremity. These apparently occurred  while she was wearing some form of compression although I'm really not sure how aggressive the compression was. I suspect this was inadequate to maintain her edema control. Nevertheless wasn't really clear to me after talking to her today. 06/03/16; the patient's burn injury on her left arm is fully resolved. Venous insufficiency wound in the left lower extremity also looks considerably better under compression. In discussion today and is not really clear what stocking she had at home. She did have juxtalite stockings but this caused a severe contact dermatitis type rash therefore she is not eligible for this which is unfortunate Objective Constitutional Sitting or standing Blood Pressure is within target range for patient.. Pulse regular and within target range for patient.Marland Kitchen Respirations regular, non-labored and within target range.. Temperature is normal and within the target range for the patient.. Patient's appearance is neat and clean. Appears in no acute distress. Well nourished and well developed.. Vitals Time Taken: 3:52 PM, Height: 68 in, Weight: 300 lbs, BMI: 45.6, Temperature: 98.3 F, Pulse: 78 bpm, Respiratory Rate: 18 breaths/min, Blood Pressure: 116/86 mmHg. Respiratory Respiratory effort is easy and symmetric bilaterally. Rate is normal at rest and on room air.. Cardiovascular Pedal pulses palpable and strong bilaterally.. Lymphatic None palpable popliteal erring on all area. Psychiatric No evidence of depression, anxiety, or agitation. Calm, cooperative, and communicative. Appropriate interactions and affect.. General Notes: Would exam; the patient's left medial leg wound almost is fully epithelialized. I'm going to wrap this again this week and this should be healed by next week. The area on her left arm is already healed and we have declared itself today Integumentary (Hair, Skin) Wound #7 status is Open. Original cause of wound was Blister. The wound is located on the  Left,Medial Lower Leg. The wound measures 2cm length x 0.5cm width x 0.1cm depth; 0.785cm^2 area and 0.079cm^3 volume. The wound is limited to skin breakdown. There is no tunneling  or undermining noted. There is a small amount of serous drainage noted. The wound margin is flat and intact. There is large (67- 100%) red, friable granulation within the wound bed. There is no necrotic tissue within the wound bed. The [name], [name]. ([id number]) periwound skin appearance did not exhibit: Callus, Crepitus, Excoriation, Induration, Rash, Scarring, Dry/Scaly, Maceration,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

## 2016-06-09 ENCOUNTER — Encounter: Payer: Self-pay | Admitting: Emergency Medicine

## 2016-06-09 ENCOUNTER — Emergency Department
Admission: EM | Admit: 2016-06-09 | Discharge: 2016-06-09 | Disposition: A | Discharge status: Home / Self Care | Payer: Medicare Other | Attending: Emergency Medicine | Admitting: Emergency Medicine

## 2016-06-09 ENCOUNTER — Emergency Department: Payer: Medicare Other

## 2016-06-09 DIAGNOSIS — R05 Cough: Secondary | ICD-10-CM | POA: Diagnosis not present

## 2016-06-09 DIAGNOSIS — Z7984 Long term (current) use of oral hypoglycemic drugs: Secondary | ICD-10-CM | POA: Insufficient documentation

## 2016-06-09 DIAGNOSIS — Z79899 Other long term (current) drug therapy: Secondary | ICD-10-CM | POA: Diagnosis not present

## 2016-06-09 DIAGNOSIS — R1084 Generalized abdominal pain: Secondary | ICD-10-CM

## 2016-06-09 DIAGNOSIS — R059 Cough, unspecified: Secondary | ICD-10-CM

## 2016-06-09 DIAGNOSIS — I10 Essential (primary) hypertension: Secondary | ICD-10-CM | POA: Insufficient documentation

## 2016-06-09 DIAGNOSIS — R748 Abnormal levels of other serum enzymes: Secondary | ICD-10-CM | POA: Diagnosis not present

## 2016-06-09 DIAGNOSIS — E1165 Type 2 diabetes mellitus with hyperglycemia: Secondary | ICD-10-CM | POA: Diagnosis not present

## 2016-06-09 DIAGNOSIS — R739 Hyperglycemia, unspecified: Secondary | ICD-10-CM

## 2016-06-09 LAB — COMPREHENSIVE METABOLIC PANEL
ALBUMIN: 3.4 g/dL — AB (ref 3.5–5.0)
ALK PHOS: 73 U/L (ref 38–126)
ALT: 28 U/L (ref 14–54)
ANION GAP: 10 (ref 5–15)
AST: 41 U/L (ref 15–41)
BUN: 27 mg/dL — ABNORMAL HIGH (ref 6–20)
CALCIUM: 8.3 mg/dL — AB (ref 8.9–10.3)
CHLORIDE: 100 mmol/L — AB (ref 101–111)
CO2: 29 mmol/L (ref 22–32)
Creatinine, Ser: 1.65 mg/dL — ABNORMAL HIGH (ref 0.44–1.00)
GFR calc Af Amer: 35 mL/min — ABNORMAL LOW (ref >60)
GFR calc non Af Amer: 30 mL/min — ABNORMAL LOW (ref >60)
GLUCOSE: 295 mg/dL — AB (ref 65–99)
POTASSIUM: 3.4 mmol/L — AB (ref 3.5–5.1)
SODIUM: 139 mmol/L (ref 135–145)
Total Bilirubin: 0.4 mg/dL (ref 0.3–1.2)
Total Protein: 6.8 g/dL (ref 6.5–8.1)

## 2016-06-09 LAB — CBC
HEMATOCRIT: 35.9 % (ref 35.0–47.0)
HEMOGLOBIN: 11.8 g/dL — AB (ref 12.0–16.0)
MCH: 29.7 pg (ref 26.0–34.0)
MCHC: 32.9 g/dL (ref 32.0–36.0)
MCV: 90.4 fL (ref 80.0–100.0)
Platelets: 151 10*3/uL (ref 150–440)
RBC: 3.97 MIL/uL (ref 3.80–5.20)
RDW: 13.4 % (ref 11.5–14.5)
WBC: 2.3 10*3/uL — ABNORMAL LOW (ref 3.6–11.0)

## 2016-06-09 LAB — LIPASE, BLOOD: LIPASE: 89 U/L — AB (ref 11–51)

## 2016-06-09 MED ORDER — BENZONATATE 100 MG PO CAPS: 100.0000 mg | ORAL_CAPSULE | Freq: Three times a day (TID) | ORAL | 0 refills | Status: DC | PRN

## 2016-06-09 MED ORDER — ACETAMINOPHEN 500 MG PO TABS
1000.0000 mg | ORAL_TABLET | Freq: Once | ORAL | Status: AC
  Administered 2016-06-09: 1000 mg via ORAL
  Filled 2016-06-09: qty 2

## 2016-06-09 MED ORDER — BENZONATATE 100 MG PO CAPS
200.0000 mg | ORAL_CAPSULE | Freq: Once | ORAL | Status: AC
  Administered 2016-06-09: 200 mg via ORAL
  Filled 2016-06-09 (×2): qty 2

## 2016-06-09 NOTE — ED Notes (Signed)
Pt reports that she has cold sxs (headache, runny nose, body aches), cough (non-productive), and chills - pt reports she has been sick for a week

## 2016-06-09 NOTE — ED Provider Notes (Signed)
[facility] Emergency Department Provider Note  ____________________________________________  Time seen: Approximately 7:21 PM  I have reviewed the triage vital signs and the nursing notes.   HISTORY  Chief Complaint Abdominal Pain    HPI [name] is a 71 y.o. female with morbid obesity, HTN, DM, presenting with cough, sore throat, and subsequent abdominal soreness. The patient reports that for the past 4 days she has had a dry cough "it feels like something is in there but it won't come up." This is been associated with a sore throat that started after a significant amount of coughing.She also then developed diffuse abdominal soreness, which only occurs with cough or contraction of the abdominal wall. She is not had any associated nausea or vomiting, diarrhea, fever or chills. No ear pain, congestion or rhinorrhea. She tried Tussionex with significant improvement of her symptoms.   Past Medical History:  Diagnosis Date  . Anemia    as young child  . Arthritis    back, leg and foot  . Blood transfusion    1964  . Carpal tunnel syndrome   . Diabetes mellitus    oral medications  . Hypertension     There are no active problems to display for this patient.   Past Surgical History:  Procedure Laterality Date  . ABDOMINAL HYSTERECTOMY    . ANTERIOR CERVICAL DECOMP/DISCECTOMY FUSION  04/27/2011   Procedure: ANTERIOR CERVICAL DECOMPRESSION/DISCECTOMY FUSION 2 LEVELS;  Surgeon: Mariam Dollar, MD;  Location: [facility];  Service: Neurosurgery;  Laterality: N/A;  Anterior Cervical Decompression/ Discectomy Fusion Cervical Three-Four, Cervical Four-Five  . APPENDECTOMY    . KNEE ARTHROPLASTY     right  . PERIPHERAL VASCULAR CATHETERIZATION Left 10/29/2014   Procedure: Lower Extremity Angiography;  Surgeon: Annice Needy, MD;  Location: [facility] INVASIVE CV [facility];  Service: Cardiovascular;  Laterality: Left;  . PERIPHERAL VASCULAR CATHETERIZATION Left 10/29/2014    Procedure: Lower Extremity Intervention;  Surgeon: Annice Needy, MD;  Location: [facility] INVASIVE CV LAB;  Service: Cardiovascular;  Laterality: Left;  . PERIPHERAL VASCULAR CATHETERIZATION Left 03/11/2015   Procedure: Lower Extremity Angiography;  Surgeon: Annice Needy, MD;  Location: [facility] INVASIVE CV LAB;  Service: Cardiovascular;  Laterality: Left;  . PERIPHERAL VASCULAR CATHETERIZATION Left 03/11/2015   Procedure: Lower Extremity Intervention;  Surgeon: Annice Needy, MD;  Location: [facility] INVASIVE CV LAB;  Service: Cardiovascular;  Laterality: Left;    Current Outpatient Rx  . Order #: [id number] Class: Historical Med  . Order #: [id number] Class: Print  . Order #: [id number] Class: Historical Med  . Order #: [id number] Class: Historical Med  . Order #: [id number] Class: Historical Med  . Order #: [id number] Class: Historical Med  . Order #: [id number] Class: Historical Med  . Order #: [id number] Class: Historical Med  . Order #: [id number] Class: Historical Med  . Order #: [id number] Class: Historical Med  . Order #: [id number] Class: Historical Med  . Order #: [id number] Class: Historical Med  . Order #: [id number] Class: Historical Med  . Order #: [id number] Class: Historical Med  . Order #: [id number] Class: Historical Med    Allergies Penicillins and Codeine  No family history on file.  Social History Social History  Substance Use Topics  . Smoking status: Never Smoker  . Smokeless tobacco: Never Used  . Alcohol use No    Review of Systems Constitutional: No fever/chills.No lightheadedness or syncope. Eyes: No visual changes. ENT: Positive sore throat. No congestion or rhinorrhea. No ear pain. Cardiovascular: Denies chest pain. Denies palpitations. Respiratory: Denies shortness  of breath.  No cough. Gastrointestinal: Positive diffuse abdominal pain.  No nausea, no vomiting.  No diarrhea.  No constipation. Genitourinary: Negative for dysuria. Musculoskeletal: Negative for back pain. Skin: Negative for  rash. Neurological: Negative for headaches. No focal numbness, tingling or weakness.  Endocrine:Positive for hyperglycemia. 10-point ROS otherwise negative.  ____________________________________________   PHYSICAL EXAM:  VITAL SIGNS: ED Triage Vitals  Enc Vitals Group     BP 06/09/16 1829 (!) 123/46     Pulse Rate 06/09/16 1829 79     Resp 06/09/16 1829 15     Temp 06/09/16 1829 98 F (36.7 C)     Temp Source 06/09/16 1829 Oral     SpO2 06/09/16 1829 100 %     Weight 06/09/16 1831 295 lb (133.8 kg)     Height 06/09/16 1831  (1.727 m)     Head Circumference --      Peak Flow --      Pain Score 06/09/16 1829 8     Pain Loc --      Pain Edu? --      Excl. in GC? --     Constitutional: Alert and oriented. Well appearing and in no acute distress. Answers questions appropriately. Eyes: Conjunctivae are normal.  EOMI. No scleral icterus.No eye discharge. Head: Atraumatic. Nose: No congestion/rhinnorhea. Mouth/Throat: Mucous membranes are moist. No posterior pharyngeal erythema, tonsillar swelling or exudate. The posterior palate is symmetric and uvula is midline. Neck: No stridor.  Supple.   Cardiovascular: Normal rate, regular rhythm. No murmurs, rubs or gallops.  Respiratory: Normal respiratory effort.  No accessory muscle use or retractions. Lungs CTAB.  No wheezes, rales or ronchi. Deep breaths trigger a dry cough. Gastrointestinal: Morbidly obese. Soft, and nondistended.  Diffuse tenderness to palpation without focality that the patient reports is in the abdominal wall rather than a deep pain sensation. No guarding or rebound.  No peritoneal signs. Musculoskeletal: No LE edema. No ttp in the calves or palpable cords.  Negative Homan's sign. Neurologic:  A&Ox3.  Speech is clear.  Face and smile are symmetric.  EOMI.  Moves all extremities well. Skin:  Skin is warm, dry and intact. No rash noted. Psychiatric: Mood and affect are normal. Speech and behavior are normal.   Normal judgement.  ____________________________________________   LABS (all labs ordered are listed, but only abnormal results are displayed)  Labs Reviewed  LIPASE, BLOOD - Abnormal; Notable for the following:       Result Value   Lipase 89 (*)    All other components within normal limits  COMPREHENSIVE METABOLIC PANEL - Abnormal; Notable for the following:    Potassium 3.4 (*)    Chloride 100 (*)    Glucose, Bld 295 (*)    BUN 27 (*)    Creatinine, Ser 1.65 (*)    Calcium 8.3 (*)    Albumin 3.4 (*)    GFR calc non Af Amer 30 (*)    GFR calc Af Amer 35 (*)    All other components within normal limits  CBC - Abnormal; Notable for the following:    WBC 2.3 (*)    Hemoglobin 11.8 (*)    All other components within normal limits   ____________________________________________  EKG  Not indicated ____________________________________________  RADIOLOGY  No results found.  ____________________________________________   PROCEDURES  Procedure(s) performed: None  Procedures  Critical Care performed: No ____________________________________________   INITIAL IMPRESSION / ASSESSMENT AND PLAN / ED COURSE  Pertinent labs &  imaging results that were available during my care of the patient were reviewed by me and considered in my medical decision making (see chart for details).  71 y.o. female with several days of dry cough and resulting sore throat and abdominal wall pain. Overall, the patient's symptoms are most consistent with a URI, likely viral. We'll get a chest x-ray to rule out pneumonia. Her white blood cell count is 2.3, which may be an inflammatory response. She also has a lipase of 89, but without any focal pain, or other associated symptoms, acute pancreatitis is much less likely. This may be related to medications, and I have asked her to follow up with her primary care physician to have her lipase rechecked and for repeat abdominal examination. Her with Tessalon  Perles, Tylenol given her renal insufficiency and will avoid NSAIDs, and have her follow-up with her primary care physician tomorrow. Return precautions were discussed.  ____________________________________________  FINAL CLINICAL IMPRESSION(S) / ED DIAGNOSES  Final diagnoses:  Hyperglycemia  Elevated lipase  Cough  Generalized abdominal pain         NEW MEDICATIONS STARTED DURING THIS VISIT:  New Prescriptions   No medications on file      Rockne Menghini, MD 06/09/16 1930

## 2016-06-09 NOTE — Discharge Instructions (Signed)
You may continue to take Tussionex, or take [employer], for your cough. For your abdominal pain, you may take Tylenol. Please avoid NSAID medications such as Motrin, ibuprofen, Aleve, or Advil because this can worsen your abnormal kidney function.  Today, you had one blood test that showed an elevated enzyme from your pancreas. It is unlikely that you have pancreatitis, but you will need to see your regular doctor tomorrow to have it rechecked and to have a repeat abdominal examination.  Return to the emergency department if you develop severe pain, shortness of breath, vomiting, fever, or any other symptoms concerning to you.

## 2016-06-09 NOTE — ED Triage Notes (Signed)
Patient here with multiple complaints. Patient c/o cough, cold chills, HA, sore throat, nausea and abdominal pain. When asked what her number one priority is patient states, "abdomina pain". Patient points to left side. Patient states it radiates over to the right side. Denies emesis.

## 2016-06-09 NOTE — ED Notes (Signed)
ED Provider at bedside. 

## 2016-06-12 ENCOUNTER — Encounter: Payer: Medicare Other | Admitting: Physician Assistant

## 2016-06-12 DIAGNOSIS — I87312 Chronic venous hypertension (idiopathic) with ulcer of left lower extremity: Secondary | ICD-10-CM | POA: Diagnosis not present

## 2016-06-12 DIAGNOSIS — E11622 Type 2 diabetes mellitus with other skin ulcer: Secondary | ICD-10-CM | POA: Diagnosis not present

## 2016-06-12 DIAGNOSIS — Z7984 Long term (current) use of oral hypoglycemic drugs: Secondary | ICD-10-CM | POA: Diagnosis not present

## 2016-06-12 DIAGNOSIS — L97822 Non-pressure chronic ulcer of other part of left lower leg with fat layer exposed: Secondary | ICD-10-CM | POA: Diagnosis not present

## 2016-06-12 DIAGNOSIS — Z880 Allergy status to penicillin: Secondary | ICD-10-CM | POA: Diagnosis not present

## 2016-06-12 DIAGNOSIS — I872 Venous insufficiency (chronic) (peripheral): Secondary | ICD-10-CM | POA: Diagnosis not present

## 2016-06-12 DIAGNOSIS — E1151 Type 2 diabetes mellitus with diabetic peripheral angiopathy without gangrene: Secondary | ICD-10-CM | POA: Diagnosis not present

## 2016-06-12 DIAGNOSIS — T22032A Burn of unspecified degree of left upper arm, initial encounter: Secondary | ICD-10-CM | POA: Diagnosis not present

## 2016-06-12 DIAGNOSIS — I739 Peripheral vascular disease, unspecified: Secondary | ICD-10-CM | POA: Diagnosis not present

## 2016-06-12 DIAGNOSIS — L97229 Non-pressure chronic ulcer of left calf with unspecified severity: Secondary | ICD-10-CM | POA: Diagnosis not present

## 2016-06-12 DIAGNOSIS — Z885 Allergy status to narcotic agent status: Secondary | ICD-10-CM | POA: Diagnosis not present

## 2016-06-12 DIAGNOSIS — Z91041 Radiographic dye allergy status: Secondary | ICD-10-CM | POA: Diagnosis not present

## 2016-06-12 DIAGNOSIS — I10 Essential (primary) hypertension: Secondary | ICD-10-CM | POA: Diagnosis not present

## 2016-06-12 DIAGNOSIS — M1990 Unspecified osteoarthritis, unspecified site: Secondary | ICD-10-CM | POA: Diagnosis not present

## 2016-06-14 NOTE — Progress Notes (Signed)
ALLYN, BARTELSON ([id number]) Visit Report for 06/12/2016 Arrival Information Details Patient Name: [name], [name] Date of Service: 06/12/2016 12:30 PM Medical Record Number: [id number] Patient Account Number: [id number] Date of Birth/Sex: Jan 03, 1946 (71 y.o. Female) Treating RN: Curtis Sites Primary Care Lawsyn Heiler: Sherrie Mustache Other Clinician: Referring Sharion Grieves: Sherrie Mustache Treating Emmerson Shuffield/Extender: Linwood Dibbles, HOYT Weeks in Treatment: 3 Visit Information History Since Last Visit Added or deleted any medications: No Patient Arrived: Wheel Chair Any new allergies or adverse reactions: No Arrival Time: 12:38 Had a fall or experienced change in No Accompanied By: friend activities of daily living that may affect Transfer Assistance: None risk of falls: Patient Identification Verified: Yes Signs or symptoms of abuse/neglect since last No Secondary Verification Process Yes visito Completed: Hospitalized since last visit: No Patient Has Alerts: Yes Has Dressing in Place as Prescribed: Yes Patient Alerts: aspirin 81, Has Compression in Place as Prescribed: Yes plavix Pain Present Now: No DMII Electronic Signature(s) Signed: 06/12/2016 5:11:48 PM By: Curtis Sites Entered By: Curtis Sites on 06/12/2016 12:42:27 Feldpausch, [name] ([id number]) -------------------------------------------------------------------------------- Clinic Level of Care Assessment Details Patient Name: [name] Date of Service: 06/12/2016 12:30 PM Medical Record Number: [id number] Patient Account Number: [id number] Date of Birth/Sex: 1945/05/15 (71 y.o. Female) Treating RN: Curtis Sites Primary Care Labib Cwynar: Sherrie Mustache Other Clinician: Referring Cathline Dowen: Sherrie Mustache Treating Zanae Kuehnle/Extender: Linwood Dibbles, HOYT Weeks in Treatment: 3 Clinic Level of Care Assessment Items TOOL 4 Quantity Score  - Use when only an EandM is performed on FOLLOW-UP visit 0 ASSESSMENTS - Nursing Assessment  / Reassessment X - Reassessment of Co-morbidities (includes updates in patient status) 1 10 X - Reassessment of Adherence to Treatment Plan 1 5 ASSESSMENTS - Wound and Skin Assessment / Reassessment X - Simple Wound Assessment / Reassessment - one wound 1 5  - Complex Wound Assessment / Reassessment - multiple wounds 0  - Dermatologic / Skin Assessment (not related to wound area) 0 ASSESSMENTS - Focused Assessment X - Circumferential Edema Measurements - multi extremities 1 5  - Nutritional Assessment / Counseling / Intervention 0 X - Lower Extremity Assessment (monofilament, tuning fork, pulses) 1 5  - Peripheral Arterial Disease Assessment (using hand held doppler) 0 ASSESSMENTS - Ostomy and/or Continence Assessment and Care  - Incontinence Assessment and Management 0  - Ostomy Care Assessment and Management (repouching, etc.) 0 PROCESS - Coordination of Care X - Simple Patient / Family Education for ongoing care 1 15  - Complex (extensive) Patient / Family Education for ongoing care 0  - Staff obtains Chiropractor, Records, Test Results / Process Orders 0  - Staff telephones HHA, Nursing Homes / Clarify orders / etc 0  - Routine Transfer to another Facility (non-emergent condition) 0 Woody, Miara N. ([id number])  - Routine Hospital Admission (non-emergent condition) 0  - New Admissions / Manufacturing engineer / Ordering NPWT, Apligraf, etc. 0  - Emergency Hospital Admission (emergent condition) 0 X - Simple Discharge Coordination 1 10  - Complex (extensive) Discharge Coordination 0 PROCESS - Special Needs  - Pediatric / Minor Patient Management 0  - Isolation Patient Management 0  - Hearing / Language / Visual special needs 0  - Assessment of Community assistance (transportation, D/C planning, etc.) 0  - Additional assistance / Altered mentation 0  - Support Surface(s) Assessment (bed, cushion, seat, etc.) 0 INTERVENTIONS - Wound Cleansing /  Measurement X - Simple Wound Cleansing - one wound 1 5  - Complex Wound Cleansing - multiple wounds 0 X -  Wound Imaging (photographs - any number of wounds) 1 5  - Wound Tracing (instead of photographs) 0 X - Simple Wound Measurement - one wound 1 5  - Complex Wound Measurement - multiple wounds 0 INTERVENTIONS - Wound Dressings  - Small Wound Dressing one or multiple wounds 0  - Medium Wound Dressing one or multiple wounds 0  - Large Wound Dressing one or multiple wounds 0  - Application of Medications - topical 0  - Application of Medications - injection 0 INTERVENTIONS - Miscellaneous  - External ear exam 0 Dittmer, Tasfia N. ([id number])  - Specimen Collection (cultures, biopsies, blood, body fluids, etc.) 0  - Specimen(s) / Culture(s) sent or taken to Lab for analysis 0  - Patient Transfer (multiple staff / Michiel Sites Lift / Similar devices) 0  - Simple Staple / Suture removal (25 or less) 0  - Complex Staple / Suture removal (26 or more) 0  - Hypo / Hyperglycemic Management (close monitor of Blood Glucose) 0  - Ankle / Brachial Index (ABI) - do not check if billed separately 0 X - Vital Signs 1 5 Has the patient been seen at the hospital within the last three years: Yes Total Score: 75 Level Of Care: New/Established - Level 2 Electronic Signature(s) Signed: 06/12/2016 5:11:48 PM By: Curtis Sites Entered By: Curtis Sites on 06/12/2016 13:09:46 Soroka, [name] ([id number]) -------------------------------------------------------------------------------- Encounter Discharge Information Details Patient Name: [name] Date of Service: 06/12/2016 12:30 PM Medical Record Number: [id number] Patient Account Number: [id number] Date of Birth/Sex: [birth year] (71 y.o. Female) Treating RN: Curtis Sites Primary Care Ewen Varnell: Sherrie Mustache Other Clinician: Referring [facility]: Sherrie Mustache Treating Quaran Kedzierski/Extender: Linwood Dibbles, HOYT Weeks in Treatment:  3 Encounter Discharge Information Items Discharge Pain Level: 0 Discharge Condition: Stable Ambulatory Status: Wheelchair Discharge Destination: Home Transportation: Private Auto Accompanied By: brother Schedule Follow-up Appointment: Yes Medication Reconciliation completed and provided to Patient/Care No Jakobe Blau: Provided on Clinical Summary of Care: 06/12/2016 Form Type Recipient Paper Patient MW Electronic Signature(s) Signed: 06/12/2016 1:20:36 PM By: Gwenlyn Perking Entered By: Gwenlyn Perking on 06/12/2016 13:20:36 Fujimoto, [name] ([id number]) -------------------------------------------------------------------------------- Lower Extremity Assessment Details Patient Name: [name] Date of Service: 06/12/2016 12:30 PM Medical Record Number: [id number] Patient Account Number: [id number] Date of Birth/Sex: 1945/09/02 (71 y.o. Female) Treating RN: Curtis Sites Primary Care Zebbie Ace: Sherrie Mustache Other Clinician: Referring Malahki Gasaway: Sherrie Mustache Treating Sabrin Dunlevy/Extender: Linwood Dibbles, HOYT Weeks in Treatment: 3 Edema Assessment Assessed: [Left: No] [Right: No] E[Left: dema] [Right: :] Calf Left: Right: Point of Measurement: 32 cm From Medial Instep 38.5 cm cm Ankle Left: Right: Point of Measurement: 12 cm From Medial Instep 24.1 cm cm Vascular Assessment Pulses: Dorsalis Pedis Palpable: [Left:Yes] Posterior Tibial Extremity colors, hair growth, and conditions: Extremity Color: [Left:Hyperpigmented] Hair Growth on Extremity: [Left:No] Temperature of Extremity: [Left:Warm] Capillary Refill: [Left:< 3 seconds] Electronic Signature(s) Signed: 06/12/2016 5:11:48 PM By: Curtis Sites Entered By: Curtis Sites on 06/12/2016 12:48:31 Gitlin, [name] ([id number]) -------------------------------------------------------------------------------- Multi Wound Chart Details Patient Name: [name] Date of Service: 06/12/2016 12:30 PM Medical Record Number:  [id number] Patient Account Number: [id number] Date of Birth/Sex: 1946-01-29 (71 y.o. Female) Treating RN: Curtis Sites Primary Care Trafton Roker: Sherrie Mustache Other Clinician: Referring Gennette Shadix: Sherrie Mustache Treating Terina Mcelhinny/Extender: Linwood Dibbles, HOYT Weeks in Treatment: 3 Vital Signs Height(in): 68 Pulse(bpm): 77 Weight(lbs): 300 Blood Pressure 136/48 (mmHg): Body Mass Index(BMI): 46 Temperature(F): 98.4 Respiratory Rate 18 (breaths/min): Photos: [N/A:N/A] Wound Location: Left, Medial Lower Leg N/A N/A Wounding Event: Blister N/A N/A  Primary Etiology: Venous Leg Ulcer N/A N/A Date Acquired: 05/04/2016 N/A N/A Weeks of Treatment: 3 N/A N/A Wound Status: Healed - Epithelialized N/A N/A Clustered Wound: Yes N/A N/A Measurements L x W x D 0x0x0 N/A N/A (cm) Area (cm) : 0 N/A N/A Volume (cm) : 0 N/A N/A % Reduction in Area: 100.00% N/A N/A % Reduction in Volume: 100.00% N/A N/A Classification: Partial Thickness N/A N/A Periwound Skin Texture: No Abnormalities Noted N/A N/A Periwound Skin No Abnormalities Noted N/A N/A Moisture: Periwound Skin Color: No Abnormalities Noted N/A N/A Tenderness on No N/A N/A Palpation: Treatment Notes ALBERTA, LENHARD ([id number]) Electronic Signature(s) Signed: 06/12/2016 5:11:48 PM By: Curtis Sites Entered By: Curtis Sites on 06/12/2016 13:08:38 AJIAH, MCGLINN ([id number]) -------------------------------------------------------------------------------- Multi-Disciplinary Care Plan Details Patient Name: [name] Date of Service: 06/12/2016 12:30 PM Medical Record Number: [id number] Patient Account Number: [id number] Date of Birth/Sex: 1945/03/13 (71 y.o. Female) Treating RN: Curtis Sites Primary Care Kace Hartje: Sherrie Mustache Other Clinician: Referring Jenness Stemler: Sherrie Mustache Treating Levy Wellman/Extender: Linwood Dibbles, HOYT Weeks in Treatment: 3 Active Inactive ` Abuse / Safety / Falls / Self Care Management Nursing  Diagnoses: Potential for falls Goals: Patient will remain injury free Date Initiated: 05/19/2016 Target Resolution Date: 08/21/2016 Goal Status: Active Interventions: Assess fall risk on admission and as needed Notes: ` Orientation to the Wound Care Program Nursing Diagnoses: Knowledge deficit related to the wound healing center program Goals: Patient/caregiver will verbalize understanding of the Wound Healing Center Program Date Initiated: 05/19/2016 Target Resolution Date: 08/21/2016 Goal Status: Active Interventions: Provide education on orientation to the wound center Notes: ` Venous Leg Ulcer Nursing Diagnoses: Actual venous Insuffiency (use after diagnosis is confirmed) DAYAN, KREIS ([id number]) Goals: Patient will maintain optimal edema control Date Initiated: 05/19/2016 Target Resolution Date: 08/21/2016 Goal Status: Active Interventions: Assess peripheral edema status every visit. Compression as ordered Notes: ` Wound/Skin Impairment Nursing Diagnoses: Impaired tissue integrity Goals: Patient/caregiver will verbalize understanding of skin care regimen Date Initiated: 05/19/2016 Target Resolution Date: 08/21/2016 Goal Status: Active Ulcer/skin breakdown will have a volume reduction of 30% by week 4 Date Initiated: 05/19/2016 Target Resolution Date: 08/21/2016 Goal Status: Active Ulcer/skin breakdown will have a volume reduction of 50% by week 8 Date Initiated: 05/19/2016 Target Resolution Date: 08/21/2016 Goal Status: Active Ulcer/skin breakdown will have a volume reduction of 80% by week 12 Date Initiated: 05/19/2016 Target Resolution Date: 08/21/2016 Goal Status: Active Ulcer/skin breakdown will heal within 14 weeks Date Initiated: 05/19/2016 Target Resolution Date: 08/21/2016 Goal Status: Active Interventions: Assess patient/caregiver ability to obtain necessary supplies Assess patient/caregiver ability to perform ulcer/skin care regimen upon admission and as needed Assess  ulceration(s) every visit Notes: Electronic Signature(s) Signed: 06/12/2016 5:11:48 PM By: Curtis Sites Entered By: Curtis Sites on 06/12/2016 13:08:20 Toomey, [name] ([id number] Skowron, [name] ([id number]) -------------------------------------------------------------------------------- Pain Assessment Details Patient Name: [name] Date of Service: 06/12/2016 12:30 PM Medical Record Number: [id number] Patient Account Number: [id number] Date of Birth/Sex: August 09, 1945 (71 y.o. Female) Treating RN: Curtis Sites Primary Care Darik Massing: Sherrie Mustache Other Clinician: Referring Rubel Heckard: Sherrie Mustache Treating Marketia Stallsmith/Extender: Linwood Dibbles, HOYT Weeks in Treatment: 3 Active Problems Location of Pain Severity and Description of Pain Patient Has Paino No Site Locations Pain Management and Medication Current Pain Management: Notes Topical or injectable lidocaine is offered to patient for acute pain when surgical debridement is performed. If needed, Patient is instructed to use over the counter pain medication for the following 24-48 hours after debridement. Wound care MDs do not  prescribed pain medications. Patient has chronic pain or uncontrolled pain. Patient has been instructed to make an appointment with their Primary Care Physician for pain management. Electronic Signature(s) Signed: 06/12/2016 5:11:48 PM By: Curtis Sites Entered By: Curtis Sites on 06/12/2016 12:42:35 Bazzano, [name] ([id number]) -------------------------------------------------------------------------------- Patient/Caregiver Education Details Patient Name: [name] Date of Service: 06/12/2016 12:30 PM Medical Record Number: [id number] Patient Account Number: [id number] Date of Birth/Gender: 14-Sep-1945 (71 y.o. Female) Treating RN: Curtis Sites Primary Care Physician: Sherrie Mustache Other Clinician: Referring Physician: Sherrie Mustache Treating Physician/Extender: Skeet Simmer in  Treatment: 3 Education Assessment Education Provided To: Patient Education Topics Provided Venous: Handouts: Other: how to apply caralon compression hose Methods: Demonstration, Explain/Verbal Responses: State content correctly Electronic Signature(s) Signed: 06/12/2016 5:11:48 PM By: Curtis Sites Entered By: Curtis Sites on 06/12/2016 13:10:37 Azeez, [name] ([id number]) -------------------------------------------------------------------------------- Wound Assessment Details Patient Name: [name] Date of Service: 06/12/2016 12:30 PM Medical Record Number: [id number] Patient Account Number: [id number] Date of Birth/Sex: Mar 04, 1945 (71 y.o. Female) Treating RN: Curtis Sites Primary Care Maximiano Lott: Sherrie Mustache Other Clinician: Referring Shelvia Fojtik: Sherrie Mustache Treating Kynli Chou/Extender: Linwood Dibbles, HOYT Weeks in Treatment: 3 Wound Status Wound Number: [id number] Primary Etiology: Venous Leg Ulcer Wound Location: Left, Medial Lower Leg Wound Status: Healed - Epithelialized Wounding Event: Blister Date Acquired: 05/04/2016 Weeks Of Treatment: 3 Clustered Wound: Yes Photos Photo Uploaded By: Curtis Sites on 06/12/2016 12:54:43 Wound Measurements Length: (cm) 0 Width: (cm) 0 Depth: (cm) 0 Area: (cm) 0 Volume: (cm) 0 % Reduction in Area: 100% % Reduction in Volume: 100% Wound Description Classification: Partial Thickness Periwound Skin Texture Texture Color No Abnormalities Noted: No No Abnormalities Noted: No Moisture No Abnormalities Noted: No Electronic Signature(s) Signed: 06/12/2016 5:11:48 PM By: [name], [name] ([id number]) Entered By: Curtis Sites on 06/12/2016 12:53:59 Mayol, [name] ([id number]) -------------------------------------------------------------------------------- Vitals Details Patient Name: [name] Date of Service: 06/12/2016 12:30 PM Medical Record Number: [id number] Patient Account Number: [id number] Date of  Birth/Sex: Mar 16, 1945 (71 y.o. Female) Treating RN: Curtis Sites Primary Care Sidrah Harden: Sherrie Mustache Other Clinician: Referring Esgar Barnick: Sherrie Mustache Treating Herta Hink/Extender: Linwood Dibbles, HOYT Weeks in Treatment: 3 Vital Signs Time Taken: 12:42 Temperature (F): 98.4 Height (in): 68 Pulse (bpm): 77 Weight (lbs): 300 Respiratory Rate (breaths/min): 18 Body Mass Index (BMI): 45.6 Blood Pressure (mmHg): 136/48 Reference Range: 80 - 120 mg / dl Electronic Signature(s) Signed: 06/12/2016 5:11:48 PM By: Curtis Sites Entered By: Curtis Sites on 06/12/2016 12:42:54

## 2016-06-14 NOTE — Progress Notes (Signed)
ZARRA, GEFFERT ([id number]) Visit Report for 06/12/2016 Chief Complaint Document Details Patient Name: [name], [name] Date of Service: 06/12/2016 12:30 PM Medical Record Number: [id number] Patient Account Number: [id number] Date of Birth/Sex: Oct 05, 1945 (71 y.o. Female) Treating RN: Curtis Sites Primary Care Provider: Sherrie Mustache Other Clinician: Referring Provider: Sherrie Mustache Treating Provider/Extender: Linwood Dibbles, HOYT Weeks in Treatment: 3 Information Obtained from: Patient Chief Complaint patient is here for evaluation of left lower extremity ulcers Electronic Signature(s) Signed: 06/12/2016 5:05:38 PM By: Lenda Kelp PA-C Entered By: Lenda Kelp on 06/12/2016 13:14:33 Matas, [name] ([id number]) -------------------------------------------------------------------------------- HPI Details Patient Name: [name] Date of Service: 06/12/2016 12:30 PM Medical Record Number: [id number] Patient Account Number: [id number] Date of Birth/Sex: Sep 16, 1945 (71 y.o. Female) Treating RN: Curtis Sites Primary Care Provider: Sherrie Mustache Other Clinician: Referring Provider: Sherrie Mustache Treating Provider/Extender: Linwood Dibbles, HOYT Weeks in Treatment: 3 History of Present Illness Location: left lower extremity Duration: left lower extremity ulcers have been present for approximately 2 weeks prior to initial evaluation at the wound center (blisters formed approx 3 weeks ago) and the left forearm for less than 1 week Context: the left lower extremity ulcers are secondary to edema and spontaneous blister formation HPI Description: Pleasant 71 year old with diabetes (no hemoglobin A1c available) and severe peripheral vascular disease. She reports a prior left lower extremity stent. Records unavailable. Underwent multiple level angioplasty of left lower extremity 10/29/2014 by Dr. Wyn Quaker. Developed left calf ulcerations in late December 2016. Denies any trauma. Seen by her PCP.  Bactrim prescribed. Referred to the wound clinic. Biopsy culture 02/20/2015 grew methicillin sensitive staph aureus, sensitive to Bactrim. Performing dressing changes with Prisma. Using a Tubigrip for edema control. Scheduled to see Dr. Wyn Quaker in follow-up 02/25/2015. Cancelled because of weather. Rescheduled for 03/06/2015. She returns to clinic for follow-up and is without new complaints. Pain improved. No ischemic rest pain. No fever or chills. Minimal drainage. 03/13/2015 -- records obtained from [facility] shows that she was seen by Dr. dew on 03/12/2015 and given tramadol and other symptomatic treatment. Recent ABI done showed right to be 0.94 and left to be 0.47 a left lower extremity arterial duplex was notable for an occluded stent and distal ATA. Recommendations were for left lower extremity angiogram with intervention. on 03/11/2015 she was taken up for a aortogram with angioplasty of the left posterior tibial artery and tibioperoneal trunk, above-knee popliteal artery and almost entire SFA, stent placement to the SFA and popliteal artery. 03/20/15 her arterial interventions noted. The patient states her left leg feels better. He ischemic wound on her left anterior leg. She has been using Santyl 04/09/15; the patient arrives back today. 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 dew. I was concerned about her last week as she had weeping edema fluid and areas of threatened ulceration around her wound. I ordered a silver alginate, Profore light wrap. She comes back today with an Radio broadcast assistant on apparently applied by Dr. Driscilla Grammes office. Her edema is under much better control and the weeping area here last week surrounding her major wound has resolved. She does have another open area which is small and superficial. I'm not sure I would put an Unna boot on this lady's degree of arterial insufficiency. 04/16/15; her original small  wound appears to be healthy with good granulation her measurements are improved. She has a small area underneath this. Both underwent selective debridement since surface slough 04/23/15; the original  wound has filled in there is healthy granulation here. The small area underneath this is healed. No debridement was required 04/30/15; the wound no longer has any depth. no Debridement required MARETTA, [name] ([id number]) 05/07/15; the wound appears to be stable. Advancing epithelialization. Decrease wound volume. No debridement is required. She has known arterial disease status post stent placement to the SFA and popliteal artery. 05/14/15 unfortunately the wound is not really decreased much this week she still has a rim of epithelialization. The wound bed looks stable nevertheless I did a surgical debridement and there is some adherent surface slough. She has known PAD however the blood supply appears to be adequate status post stent to the SFA and popliteal and popliteal artery 05/21/15 wound is healthier than last week. No debridement is necessary. Known PAD status post stent to the SFA and popliteal artery 05/28/15; wound is no different from last week and in fact looks "stalled". Fairly we put Iodoflex on him last week however home health used Prisma 06/04/15 if anything the wound area appears to be larger. 06/11/15 switch to silver alginate last week and this seems to have helped. There is a thin layer of epithelialization only visible under the light 06/18/15; the patient had her leg traumatized when a grandchild fell over her leg at a party last weekend. Her original wound is just about closed very tiny however she has a new larger superficial wound just distal to the original wound. No evidence of infection 06/25/15; both of the wounds on her left leg including the chronic wound we have been treating her for a prolonged period of time now, and the traumatic wound from last week both are fully  epithelialized. She apparently had a slip this week and managed to lift her nail off her right great toe. 07/02/15; patient's legs are carefully inspected. Her original wound on the left medial leg is completely healed. Traumatic area on her right great toenail from last week is also healed. She has combined venous and arterial insufficiency we have obtained her juxtalite stockings bilaterally. 07/16/15 this is a patient we recently discharged. She states that the juxtalite stocking cause irritation and blistering on her anterior left leg. She is not therefore used them in the last 6 days 07/23/15; once again the patient has no open area here. Her edema is well controlled. She is very reluctant to try the juxtalite's again because she attributes this to the blistering on her left anterior leg. Both the medial original wound, and the blister on the anterior left leg give healed === 05/19/16- the patient is here for initial evaluation of her left lower extremity ulcers and a burn to the left forearm. She states that upon discharge last year she was given juxta light compression wraps, she could not tolerate those and was given compression stockings which she recently converted to compression socks. She states that the compression socks are "medium" and is unable to articulate the exact millimeters of mercury. She states that the left lower extremity developed spontaneous blisters which eventually ulcerated 2 weeks ago. She has not been wearing compression stockings or socks to either extremity since this occurred. She states that she burned her left forearm on a hot pan while cooking for [employer]. She has been applying no topical agents to either. She is a diabetic, she is unaware of her recent A1c but states her morning glucose was 197. She has a follow-up with her PCP, Dr.Jadali this Friday. She has known mixed arterial and  venous disease. 05/27/16; the patient has a burn suffered on her left arm  while moving pots on her stove. She has been using hydrogel to this. She also has venous insufficiency wounds on the left lower extremity. These apparently occurred while she was wearing some form of compression although I'm really not sure how aggressive the compression was. I suspect this was inadequate to maintain her edema control. Nevertheless wasn't really clear to me after talking to her today. 06/03/16; the patient's burn injury on her left arm is fully resolved. Venous insufficiency wound in the left lower extremity also looks considerably better under compression. In discussion today and is not really clear what stocking she had at home. She did have juxtalite stockings but this caused a severe contact dermatitis type rash therefore she is not eligible for this which is unfortunate 06/12/16 Patient appears to be doing very well in her leftover study wounds have completely healed on BREANNA, SHORKEY N. ([id number]) evaluation today. She has been tolerating her compression at home although we do have a two layer compression hose that has been ordered and will be applied going forward today. She is pleased with how this progress. Electronic Signature(s) Signed: 06/12/2016 5:05:38 PM By: Lenda Kelp PA-C Entered By: Lenda Kelp on 06/12/2016 13:16:47 Takaki, [name] ([id number]) -------------------------------------------------------------------------------- Physical Exam Details Patient Name: [name] Date of Service: 06/12/2016 12:30 PM Medical Record Number: [id number] Patient Account Number: [id number] Date of Birth/Sex: 1945/11/27 (71 y.o. Female) Treating RN: Curtis Sites Primary Care Provider: Sherrie Mustache Other Clinician: Referring Provider: Sherrie Mustache Treating Provider/Extender: Linwood Dibbles, HOYT Weeks in Treatment: 3 Constitutional Obese and well-hydrated in no acute distress. Respiratory normal breathing without difficulty. Psychiatric this patient is able to  make decisions and demonstrates good insight into disease process. Alert and Oriented x 3. pleasant and cooperative. Electronic Signature(s) Signed: 06/12/2016 5:05:38 PM By: Lenda Kelp PA-C Entered By: Lenda Kelp on 06/12/2016 13:17:19 Blizzard, [name] ([id number]) -------------------------------------------------------------------------------- Physician Orders Details Patient Name: [name] Date of Service: 06/12/2016 12:30 PM Medical Record Number: [id number] Patient Account Number: [id number] Date of Birth/Sex: Oct 02, 1945 (71 y.o. Female) Treating RN: Curtis Sites Primary Care Provider: Sherrie Mustache Other Clinician: Referring Provider: Sherrie Mustache Treating Provider/Extender: Linwood Dibbles, HOYT Weeks in Treatment: 3 Verbal / Phone Orders: No Diagnosis Coding ICD-10 Coding Code Description I87.312 Chronic venous hypertension (idiopathic) with ulcer of left lower extremity E11.622 Type 2 diabetes mellitus with other skin ulcer L97.822 Non-pressure chronic ulcer of other part of left lower leg with fat layer exposed T22.032A Burn of unspecified degree of left upper arm, initial encounter Follow-up Appointments o Return Appointment in 2 weeks. Edema Control o Patient to wear own compression stockings Electronic Signature(s) Signed: 06/12/2016 5:05:38 PM By: Lenda Kelp PA-C Signed: 06/12/2016 5:11:48 PM By: Curtis Sites Entered By: Curtis Sites on 06/12/2016 13:09:01 Sidney, [name] ([id number]) -------------------------------------------------------------------------------- Problem List Details Patient Name: [name] Date of Service: 06/12/2016 12:30 PM Medical Record Number: [id number] Patient Account Number: [id number] Date of Birth/Sex: August 02, 1945 (71 y.o. Female) Treating RN: Curtis Sites Primary Care Provider: Sherrie Mustache Other Clinician: Referring Provider: Sherrie Mustache Treating Provider/Extender: Skeet Simmer in Treatment:  3 Active Problems ICD-10 Encounter Code Description Active Date Diagnosis I87.312 Chronic venous hypertension (idiopathic) with ulcer of left 05/19/2016 Yes lower extremity E11.622 Type 2 diabetes mellitus with other skin ulcer 05/19/2016 Yes L97.822 Non-pressure chronic ulcer of other part of left lower leg 05/19/2016 Yes with fat layer  exposed T22.032A Burn of unspecified degree of left upper arm, initial 05/19/2016 Yes encounter Inactive Problems Resolved Problems Electronic Signature(s) Signed: 06/12/2016 5:05:38 PM By: Lenda Kelp PA-C Entered By: Lenda Kelp on 06/12/2016 13:04:32 Hoadley, [name] ([id number]) -------------------------------------------------------------------------------- Progress Note Details Patient Name: [name] Date of Service: 06/12/2016 12:30 PM Medical Record Number: [id number] Patient Account Number: [id number] Date of Birth/Sex: 1946-01-10 (71 y.o. Female) Treating RN: Curtis Sites Primary Care Provider: Sherrie Mustache Other Clinician: Referring Provider: Sherrie Mustache Treating Provider/Extender: Linwood Dibbles, HOYT Weeks in Treatment: 3 Subjective Chief Complaint Information obtained from Patient patient is here for evaluation of left lower extremity ulcers History of Present Illness (HPI) The following HPI elements were documented for the patient's wound: Location: left lower extremity Duration: left lower extremity ulcers have been present for approximately 2 weeks prior to initial evaluation at the wound center (blisters formed approx 3 weeks ago) and the left forearm for less than 1 week Context: the left lower extremity ulcers are secondary to edema and spontaneous blister formation Pleasant 70 year old with diabetes (no hemoglobin A1c available) and severe peripheral vascular disease. She reports a prior left lower extremity stent. Records unavailable. Underwent multiple level angioplasty of left lower extremity 10/29/2014 by Dr.  Wyn Quaker. Developed left calf ulcerations in late December 2016. Denies any trauma. Seen by her PCP. Bactrim prescribed. Referred to the wound clinic. Biopsy culture 02/20/2015 grew methicillin sensitive staph aureus, sensitive to Bactrim. Performing dressing changes with Prisma. Using a Tubigrip for edema control. Scheduled to see Dr. Wyn Quaker in follow-up 02/25/2015. Cancelled because of weather. Rescheduled for 03/06/2015. She returns to clinic for follow-up and is without new complaints. Pain improved. No ischemic rest pain. No fever or chills. Minimal drainage. 03/13/2015 -- records obtained from [facility] shows that she was seen by Dr. dew on 03/12/2015 and given tramadol and other symptomatic treatment. Recent ABI done showed right to be 0.94 and left to be 0.47 a left lower extremity arterial duplex was notable for an occluded stent and distal ATA. Recommendations were for left lower extremity angiogram with intervention. on 03/11/2015 she was taken up for a aortogram with angioplasty of the left posterior tibial artery and tibioperoneal trunk, above-knee popliteal artery and almost entire SFA, stent placement to the SFA and popliteal artery. 03/20/15 her arterial interventions noted. The patient states her left leg feels better. He ischemic wound on her left anterior leg. She has been using Santyl 04/09/15; the patient arrives back today. 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 dew. I was concerned about her last week as she had weeping edema fluid and areas of threatened ulceration around her wound. I ordered a silver alginate, Profore light wrap. She comes back today with an Radio broadcast assistant on apparently applied by Dr. Driscilla Grammes office. Her edema is under much better control and the weeping area here last week surrounding her major wound has resolved. She does have another open area which is small and superficial. I'm not sure I would  put an Unna boot on this lady's GRACIANNA, VINK N. ([id number]) degree of arterial insufficiency. 04/16/15; her original small wound appears to be healthy with good granulation her measurements are improved. She has a small area underneath this. Both underwent selective debridement since surface slough 04/23/15; the original wound has filled in there is healthy granulation here. The small area underneath this is healed. No debridement was required 04/30/15; the wound no longer has any depth. no  Debridement required 05/07/15; the wound appears to be stable. Advancing epithelialization. Decrease wound volume. No debridement is required. She has known arterial disease status post stent placement to the SFA and popliteal artery. 05/14/15 unfortunately the wound is not really decreased much this week she still has a rim of epithelialization. The wound bed looks stable nevertheless I did a surgical debridement and there is some adherent surface slough. She has known PAD however the blood supply appears to be adequate status post stent to the SFA and popliteal and popliteal artery 05/21/15 wound is healthier than last week. No debridement is necessary. Known PAD status post stent to the SFA and popliteal artery 05/28/15; wound is no different from last week and in fact looks "stalled". Fairly we put Iodoflex on him last week however home health used Prisma 06/04/15 if anything the wound area appears to be larger. 06/11/15 switch to silver alginate last week and this seems to have helped. There is a thin layer of epithelialization only visible under the light 06/18/15; the patient had her leg traumatized when a grandchild fell over her leg at a party last weekend. Her original wound is just about closed very tiny however she has a new larger superficial wound just distal to the original wound. No evidence of infection 06/25/15; both of the wounds on her left leg including the chronic wound we have been treating her  for a prolonged period of time now, and the traumatic wound from last week both are fully epithelialized. She apparently had a slip this week and managed to lift her nail off her right great toe. 07/02/15; patient's legs are carefully inspected. Her original wound on the left medial leg is completely healed. Traumatic area on her right great toenail from last week is also healed. She has combined venous and arterial insufficiency we have obtained her juxtalite stockings bilaterally. 07/16/15 this is a patient we recently discharged. She states that the juxtalite stocking cause irritation and blistering on her anterior left leg. She is not therefore used them in the last 6 days 07/23/15; once again the patient has no open area here. Her edema is well controlled. She is very reluctant to try the juxtalite's again because she attributes this to the blistering on her left anterior leg. Both the medial original wound, and the blister on the anterior left leg give healed === 05/19/16- the patient is here for initial evaluation of her left lower extremity ulcers and a burn to the left forearm. She states that upon discharge last year she was given juxta light compression wraps, she could not tolerate those and was given compression stockings which she recently converted to compression socks. She states that the compression socks are "medium" and is unable to articulate the exact millimeters of mercury. She states that the left lower extremity developed spontaneous blisters which eventually ulcerated 2 weeks ago. She has not been wearing compression stockings or socks to either extremity since this occurred. She states that she burned her left forearm on a hot pan while cooking for [employer]. She has been applying no topical agents to either. She is a diabetic, she is unaware of her recent A1c but states her morning glucose was 197. She has a follow-up with her PCP, Dr.Jadali this Friday. She has known  mixed arterial and venous disease. 05/27/16; the patient has a burn suffered on her left arm while moving pots on her stove. She has been using hydrogel to this. She also has venous insufficiency wounds  on the left lower extremity. These apparently occurred while she was wearing some form of compression although I'm really not sure how aggressive the compression was. I suspect this was inadequate to maintain her edema control. Nevertheless wasn't really CABRIA, MICALIZZI. ([id number]) clear to me after talking to her today. 06/03/16; the patient's burn injury on her left arm is fully resolved. Venous insufficiency wound in the left lower extremity also looks considerably better under compression. In discussion today and is not really clear what stocking she had at home. She did have juxtalite stockings but this caused a severe contact dermatitis type rash therefore she is not eligible for this which is unfortunate 06/12/16 Patient appears to be doing very well in her leftover study wounds have completely healed on evaluation today. She has been tolerating her compression at home although we do have a two layer compression hose that has been ordered and will be applied going forward today. She is pleased with how this progress. Objective Constitutional Obese and well-hydrated in no acute distress. Vitals Time Taken: 12:42 PM, Height: 68 in, Weight: 300 lbs, BMI: 45.6, Temperature: 98.4 F, Pulse: 77 bpm, Respiratory Rate: 18 breaths/min, Blood Pressure: 136/48 mmHg. Respiratory normal breathing without difficulty. Psychiatric this patient is able to make decisions and demonstrates good insight into disease process. Alert and Oriented x 3. pleasant and cooperative. Integumentary (Hair, Skin) Wound #7 status is Healed - Epithelialized. Original cause of wound was Blister. The wound is located on the Left,Medial Lower Leg. The wound measures 0cm length x 0cm width x 0cm depth; 0cm^2 area and 0cm^3  volume. Assessment Active Problems ICD-10 I87.312 - Chronic venous hypertension (idiopathic) with ulcer of left lower extremity E11.622 - Type 2 diabetes mellitus with other skin ulcer L97.822 - Non-pressure chronic ulcer of other part of left lower leg with fat layer exposed Colbaugh, Muna N. ([id number]) T22.032A - Burn of unspecified degree of left upper arm, initial encounter Plan Follow-up Appointments: Return Appointment in 2 weeks. Edema Control: Patient to wear own compression stockings As patients once have healed at this point we are going to initiate the two layer compression stocking which is a 30-40 compression. Hopefully this will prevent future blistering and subsequent ulcers. I did recommend skin prepping the healed area today prior to application. This is something the patient could do at home as well and we discussed this. Otherwise we will see her for reevaluation in two weeks just to make sure nothing has reopened. If she is doing well we can discontinue one care services at that point. Electronic Signature(s) Signed: 06/12/2016 5:05:38 PM By: Lenda Kelp PA-C Entered By: Lenda Kelp on 06/12/2016 13:19:05 Samford, [name] ([id number]) -------------------------------------------------------------------------------- SuperBill Details Patient Name: [name] Date of Service: 06/12/2016 Medical Record Number: [id number] Patient Account Number: [id number] Date of Birth/Sex: 1945-12-08 (71 y.o. Female) Treating RN: Curtis Sites Primary Care Provider: Sherrie Mustache Other Clinician: Referring Provider: Sherrie Mustache Treating Provider/Extender: Linwood Dibbles, HOYT Weeks in Treatment: 3 Diagnosis Coding ICD-10 Codes Code Description I87.312 Chronic venous hypertension (idiopathic) with ulcer of left lower extremity E11.622 Type 2 diabetes mellitus with other skin ulcer L97.822 Non-pressure chronic ulcer of other part of left lower leg with fat layer  exposed T22.032A Burn of unspecified degree of left upper arm, initial encounter Facility Procedures CPT4 Code: 95284132 Description: 44010 - WOUND CARE VISIT-LEV 2 EST PT Modifier: Quantity: 1 Physician Procedures CPT4: Description Modifier Quantity Code [id number] 99212 - WC PHYS LEVEL 2 - EST PT  1 ICD-10 Description Diagnosis I87.312 Chronic venous hypertension (idiopathic) with ulcer of left lower extremity E11.622 Type 2 diabetes mellitus with other skin ulcer  L97.822 Non-pressure chronic ulcer of other part of left lower leg with fat layer exposed T22.032A Burn of unspecified degree of left upper arm, initial encounter Electronic Signature(s) Signed: 06/12/2016 5:05:38 PM By: Lenda Kelp PA-C Entered By: Lenda Kelp on 06/12/2016 13:19:35

## 2016-06-19 DIAGNOSIS — E784 Other hyperlipidemia: Secondary | ICD-10-CM | POA: Diagnosis not present

## 2016-06-19 DIAGNOSIS — K859 Acute pancreatitis without necrosis or infection, unspecified: Secondary | ICD-10-CM | POA: Diagnosis not present

## 2016-06-19 DIAGNOSIS — R05 Cough: Secondary | ICD-10-CM | POA: Diagnosis not present

## 2016-06-19 DIAGNOSIS — E1165 Type 2 diabetes mellitus with hyperglycemia: Secondary | ICD-10-CM | POA: Diagnosis not present

## 2016-06-23 DIAGNOSIS — E1165 Type 2 diabetes mellitus with hyperglycemia: Secondary | ICD-10-CM | POA: Diagnosis not present

## 2016-06-23 DIAGNOSIS — E781 Pure hyperglyceridemia: Secondary | ICD-10-CM | POA: Diagnosis not present

## 2016-06-23 DIAGNOSIS — R1084 Generalized abdominal pain: Secondary | ICD-10-CM | POA: Diagnosis not present

## 2016-06-26 ENCOUNTER — Encounter: Payer: Medicare Other | Attending: Surgery | Admitting: Surgery

## 2016-06-26 DIAGNOSIS — Z885 Allergy status to narcotic agent status: Secondary | ICD-10-CM | POA: Diagnosis not present

## 2016-06-26 DIAGNOSIS — Z880 Allergy status to penicillin: Secondary | ICD-10-CM | POA: Diagnosis not present

## 2016-06-26 DIAGNOSIS — X58XXXA Exposure to other specified factors, initial encounter: Secondary | ICD-10-CM | POA: Diagnosis not present

## 2016-06-26 DIAGNOSIS — L97822 Non-pressure chronic ulcer of other part of left lower leg with fat layer exposed: Secondary | ICD-10-CM | POA: Insufficient documentation

## 2016-06-26 DIAGNOSIS — T22032A Burn of unspecified degree of left upper arm, initial encounter: Secondary | ICD-10-CM | POA: Insufficient documentation

## 2016-06-26 DIAGNOSIS — L97829 Non-pressure chronic ulcer of other part of left lower leg with unspecified severity: Secondary | ICD-10-CM | POA: Diagnosis not present

## 2016-06-26 DIAGNOSIS — Z91041 Radiographic dye allergy status: Secondary | ICD-10-CM | POA: Diagnosis not present

## 2016-06-26 DIAGNOSIS — I739 Peripheral vascular disease, unspecified: Secondary | ICD-10-CM | POA: Diagnosis not present

## 2016-06-26 DIAGNOSIS — M1990 Unspecified osteoarthritis, unspecified site: Secondary | ICD-10-CM | POA: Insufficient documentation

## 2016-06-26 DIAGNOSIS — E1151 Type 2 diabetes mellitus with diabetic peripheral angiopathy without gangrene: Secondary | ICD-10-CM | POA: Insufficient documentation

## 2016-06-26 DIAGNOSIS — I872 Venous insufficiency (chronic) (peripheral): Secondary | ICD-10-CM | POA: Diagnosis not present

## 2016-06-26 DIAGNOSIS — I10 Essential (primary) hypertension: Secondary | ICD-10-CM | POA: Insufficient documentation

## 2016-06-26 DIAGNOSIS — Z7984 Long term (current) use of oral hypoglycemic drugs: Secondary | ICD-10-CM | POA: Diagnosis not present

## 2016-06-26 DIAGNOSIS — I890 Lymphedema, not elsewhere classified: Secondary | ICD-10-CM | POA: Diagnosis not present

## 2016-06-26 DIAGNOSIS — I87312 Chronic venous hypertension (idiopathic) with ulcer of left lower extremity: Secondary | ICD-10-CM | POA: Diagnosis not present

## 2016-06-26 DIAGNOSIS — E11622 Type 2 diabetes mellitus with other skin ulcer: Secondary | ICD-10-CM | POA: Diagnosis not present

## 2016-06-28 NOTE — Progress Notes (Signed)
MINAH, AXELROD ([id number]) Visit Report for 06/26/2016 Arrival Information Details Patient Name: [name], [name] Date of Service: 06/26/2016 11:45 AM Medical Record Number: [id number] Patient Account Number: [id number] Date of Birth/Sex: 09-22-1945 (71 y.o. Female) Treating RN: Curtis Sites Primary Care Shirleymae Hauth: Sherrie Mustache Other Clinician: Referring Ronald Londo: Sherrie Mustache Treating Kacie Huxtable/Extender: Rudene Re in Treatment: 5 Visit Information History Since Last Visit Added or deleted any medications: No Patient Arrived: Wheel Chair Any new allergies or adverse reactions: No Arrival Time: 11:40 Had a fall or experienced change in No Accompanied By: self activities of daily living that may affect Transfer Assistance: None risk of falls: Patient Identification Verified: Yes Signs or symptoms of abuse/neglect since last No Secondary Verification Process Yes visito Completed: Hospitalized since last visit: No Patient Has Alerts: Yes Has Compression in Place as Prescribed: Yes Patient Alerts: aspirin 81, Pain Present Now: No plavix DMII Electronic Signature(s) Signed: 06/26/2016 2:17:05 PM By: Curtis Sites Entered By: Curtis Sites on 06/26/2016 11:43:57 Sorey, [name] ([id number]) -------------------------------------------------------------------------------- Clinic Level of Care Assessment Details Patient Name: [name] Date of Service: 06/26/2016 11:45 AM Medical Record Number: [id number] Patient Account Number: [id number] Date of Birth/Sex: 1946/02/02 (71 y.o. Female) Treating RN: Curtis Sites Primary Care Darthula Desa: Sherrie Mustache Other Clinician: Referring Vanita Cannell: Sherrie Mustache Treating Keajah Killough/Extender: Rudene Re in Treatment: 5 Clinic Level of Care Assessment Items TOOL 4 Quantity Score []  - Use when only an EandM is performed on FOLLOW-UP visit 0 ASSESSMENTS - Nursing Assessment / Reassessment X - Reassessment of  Co-morbidities (includes updates in patient status) 1 10 X - Reassessment of Adherence to Treatment Plan 1 5 ASSESSMENTS - Wound and Skin Assessment / Reassessment X - Simple Wound Assessment / Reassessment - one wound 1 5 []  - Complex Wound Assessment / Reassessment - multiple wounds 0 []  - Dermatologic / Skin Assessment (not related to wound area) 0 ASSESSMENTS - Focused Assessment X - Circumferential Edema Measurements - multi extremities 1 5 []  - Nutritional Assessment / Counseling / Intervention 0 X - Lower Extremity Assessment (monofilament, tuning fork, pulses) 1 5 []  - Peripheral Arterial Disease Assessment (using hand held doppler) 0 ASSESSMENTS - Ostomy and/or Continence Assessment and Care []  - Incontinence Assessment and Management 0 []  - Ostomy Care Assessment and Management (repouching, etc.) 0 PROCESS - Coordination of Care X - Simple Patient / Family Education for ongoing care 1 15 []  - Complex (extensive) Patient / Family Education for ongoing care 0 []  - Staff obtains Chiropractor, Records, Test Results / Process Orders 0 []  - Staff telephones HHA, Nursing Homes / Clarify orders / etc 0 []  - Routine Transfer to another Facility (non-emergent condition) 0 [name], [name] ([id number]) []  - Routine Hospital Admission (non-emergent condition) 0 []  - New Admissions / Manufacturing engineer / Ordering NPWT, Apligraf, etc. 0 []  - Emergency Hospital Admission (emergent condition) 0 X - Simple Discharge Coordination 1 10 []  - Complex (extensive) Discharge Coordination 0 PROCESS - Special Needs []  - Pediatric / Minor Patient Management 0 []  - Isolation Patient Management 0 []  - Hearing / Language / Visual special needs 0 []  - Assessment of Community assistance (transportation, D/C planning, etc.) 0 []  - Additional assistance / Altered mentation 0 []  - Support Surface(s) Assessment (bed, cushion, seat, etc.) 0 INTERVENTIONS - Wound Cleansing / Measurement X - Simple Wound  Cleansing - one wound 1 5 []  - Complex Wound Cleansing - multiple wounds 0 X - Wound Imaging (photographs - any number of wounds) 1  5 []  - Wound Tracing (instead of photographs) 0 X - Simple Wound Measurement - one wound 1 5 []  - Complex Wound Measurement - multiple wounds 0 INTERVENTIONS - Wound Dressings []  - Small Wound Dressing one or multiple wounds 0 []  - Medium Wound Dressing one or multiple wounds 0 []  - Large Wound Dressing one or multiple wounds 0 []  - Application of Medications - topical 0 []  - Application of Medications - injection 0 INTERVENTIONS - Miscellaneous []  - External ear exam 0 [name], [name] ([id number]) []  - Specimen Collection (cultures, biopsies, blood, body fluids, etc.) 0 []  - Specimen(s) / Culture(s) sent or taken to Lab for analysis 0 []  - Patient Transfer (multiple staff / Michiel Sites Lift / Similar devices) 0 []  - Simple Staple / Suture removal (25 or less) 0 []  - Complex Staple / Suture removal (26 or more) 0 []  - Hypo / Hyperglycemic Management (close monitor of Blood Glucose) 0 []  - Ankle / Brachial Index (ABI) - do not check if billed separately 0 X - Vital Signs 1 5 Has the patient been seen at the hospital within the last three years: Yes Total Score: 75 Level Of Care: New/Established - Level 2 Electronic Signature(s) Signed: 06/26/2016 2:17:05 PM By: Curtis Sites Entered By: Curtis Sites on 06/26/2016 11:53:00 [name], [name] ([id number]) -------------------------------------------------------------------------------- Encounter Discharge Information Details Patient Name: [name] Date of Service: 06/26/2016 11:45 AM Medical Record Number: [id number] Patient Account Number: [id number] Date of Birth/Sex: 07-19-1945 (71 y.o. Female) Treating RN: Curtis Sites Primary Care Ransom Nickson: Sherrie Mustache Other Clinician: Referring Birdell Frasier: Sherrie Mustache Treating [place] Searles/Extender: Rudene Re in Treatment: 5 Encounter Discharge Information  Items Discharge Pain Level: 0 Discharge Condition: Stable Ambulatory Status: Wheelchair Discharge Destination: Home Transportation: Private Auto Accompanied By: self Schedule Follow-up Appointment: No Medication Reconciliation completed and provided to Patient/Care No Armentha Branagan: Provided on Clinical Summary of Care: 06/26/2016 Form Type Recipient Paper Patient MW Electronic Signature(s) Signed: 06/26/2016 12:54:55 PM By: Curtis Sites Previous Signature: 06/26/2016 11:58:34 AM Version By: Gwenlyn Perking Entered By: Curtis Sites on 06/26/2016 12:54:55 Bents, [name] ([id number]) -------------------------------------------------------------------------------- Lower Extremity Assessment Details Patient Name: [name] Date of Service: 06/26/2016 11:45 AM Medical Record Number: [id number] Patient Account Number: [id number] Date of Birth/Sex: 04-11-1945 (71 y.o. Female) Treating RN: Curtis Sites Primary Care Daney Moor: Sherrie Mustache Other Clinician: Referring Melayah Skorupski: Sherrie Mustache Treating Deontay Ladnier/Extender: Rudene Re in Treatment: 5 Edema Assessment Assessed: [Left: No] [Right: No] Edema: [Left: Ye] [Right: s] Calf Left: Right: Point of Measurement: 32 cm From Medial Instep 40.8 cm cm Ankle Left: Right: Point of Measurement: 12 cm From Medial Instep 26 cm cm Vascular Assessment Pulses: Dorsalis Pedis Palpable: [Left:Yes] Posterior Tibial Extremity colors, hair growth, and conditions: Extremity Color: [Left:Hyperpigmented] Hair Growth on Extremity: [Left:No] Temperature of Extremity: [Left:Warm] Capillary Refill: [Left:< 3 seconds] Electronic Signature(s) Signed: 06/26/2016 2:17:05 PM By: Curtis Sites Entered By: Curtis Sites on 06/26/2016 11:46:55 Klaas, [name] ([id number]) -------------------------------------------------------------------------------- Multi Wound Chart Details Patient Name: [name] Date of Service: 06/26/2016 11:45  AM Medical Record Number: [id number] Patient Account Number: [id number] Date of Birth/Sex: February 09, 1946 (71 y.o. Female) Treating RN: Curtis Sites Primary Care Arzell Mcgeehan: Sherrie Mustache Other Clinician: Referring Armella Stogner: Sherrie Mustache Treating Pearce Littlefield/Extender: Rudene Re in Treatment: 5 Vital Signs Height(in): 68 Pulse(bpm): 71 Weight(lbs): 300 Blood Pressure 122/53 (mmHg): Body Mass Index(BMI): 46 Temperature(F): 98.2 Respiratory Rate 18 (breaths/min): Wound Assessments Treatment Notes Electronic Signature(s) Signed: 06/26/2016 11:56:27 AM By: Evlyn Kanner MD, FACS Entered By:  Evlyn KannerBritto, Errol on 06/26/2016 11:56:27 [name], [name] ([id number]) -------------------------------------------------------------------------------- Multi-Disciplinary Care Plan Details Patient Name: [name], Mily N. Date of Service: 06/26/2016 11:45 AM Medical Record Number: [id number] Patient Account Number: [id number] Date of Birth/Sex: 1945-08-11 (71 y.o. Female) Treating RN: Curtis Sitesorthy, Joanna Primary Care Oakley Orban: Sherrie MustacheJADALI, FAYEGH Other Clinician: Referring Jonnathan Birman: Sherrie MustacheJADALI, FAYEGH Treating Carrol Bondar/Extender: Rudene ReBritto, Errol Weeks in Treatment: 5 Active Inactive Electronic Signature(s) Signed: 06/26/2016 2:17:05 PM By: Curtis Sitesorthy, Joanna Entered By: Curtis Sitesorthy, Joanna on 06/26/2016 11:51:58 [name], [name] [name] ([id number]) -------------------------------------------------------------------------------- Pain Assessment Details Patient Name: [name], [name] Date of Service: 06/26/2016 11:45 AM Medical Record Number: [id number] Patient Account Number: [id number] Date of Birth/Sex: 1945-08-11 (71 y.o. Female) Treating RN: Curtis Sitesorthy, Joanna Primary Care Bhumi Godbey: Sherrie MustacheJADALI, FAYEGH Other Clinician: Referring Beverlyann Broxterman: Sherrie MustacheJADALI, FAYEGH Treating Erynn Vaca/Extender: Rudene ReBritto, Errol Weeks in Treatment: 5 Active Problems Location of Pain Severity and Description of Pain Patient Has Paino No Site Locations Pain Management  and Medication Current Pain Management: Notes Topical or injectable lidocaine is offered to patient for acute pain when surgical debridement is performed. If needed, Patient is instructed to use over the counter pain medication for the following 24-48 hours after debridement. Wound care MDs do not prescribed pain medications. Patient has chronic pain or uncontrolled pain. Patient has been instructed to make an appointment with their Primary Care Physician for pain management. Electronic Signature(s) Signed: 06/26/2016 2:17:05 PM By: Curtis Sitesorthy, Joanna Entered By: Curtis Sitesorthy, Joanna on 06/26/2016 11:44:05 [name], [name] [name] ([id number]) -------------------------------------------------------------------------------- Patient/Caregiver Education Details Patient Name: [name], [name] Date of Service: 06/26/2016 11:45 AM Medical Record Number: [id number] Patient Account Number: [id number] Date of Birth/Gender: 1945-08-11 (71 y.o. Female) Treating RN: Curtis Sitesorthy, Joanna Primary Care Physician: Sherrie MustacheJADALI, FAYEGH Other Clinician: Referring Physician: Sherrie MustacheJADALI, FAYEGH Treating Physician/Extender: Rudene ReBritto, Errol Weeks in Treatment: 5 Education Assessment Education Provided To: Patient Education Topics Provided Venous: Handouts: Other: continue compression daily Methods: Explain/Verbal Responses: State content correctly Electronic Signature(s) Signed: 06/26/2016 2:17:05 PM By: Curtis Sitesorthy, Joanna Entered By: Curtis Sitesorthy, Joanna on 06/26/2016 12:55:29 [name], [name] [name] ([id number]) -------------------------------------------------------------------------------- Vitals Details Patient Name: [name], Mahsa N. Date of Service: 06/26/2016 11:45 AM Medical Record Number: [id number] Patient Account Number: [id number] Date of Birth/Sex: 1945-08-11 (71 y.o. Female) Treating RN: Curtis Sitesorthy, Joanna Primary Care Gabriella Guile: Sherrie MustacheJADALI, FAYEGH Other Clinician: Referring Hatsuko Bizzarro: Sherrie MustacheJADALI, FAYEGH Treating Breccan Galant/Extender: Rudene ReBritto, Errol Weeks in  Treatment: 5 Vital Signs Time Taken: 11:44 Temperature (F): 98.2 Height (in): 68 Pulse (bpm): 71 Weight (lbs): 300 Respiratory Rate (breaths/min): 18 Body Mass Index (BMI): 45.6 Blood Pressure (mmHg): 122/53 Reference Range: 80 - 120 mg / dl Electronic Signature(s) Signed: 06/26/2016 2:17:05 PM By: Curtis Sitesorthy, Joanna Entered By: Curtis Sitesorthy, Joanna on 06/26/2016 11:45:21

## 2016-06-28 NOTE — Progress Notes (Signed)
[name], [name] ([id number]) Visit Report for 06/26/2016 Chief Complaint Document Details Patient Name: [name], [name] Date of Service: 06/26/2016 11:45 AM Medical Record Number: [id number] Patient Account Number: [id number] Date of Birth/Sex: 07-Jan-1946 (71 y.o. Female) Treating RN: Curtis Sitesorthy, Joanna Primary Care Provider: Sherrie MustacheJADALI, FAYEGH Other Clinician: Referring Provider: Sherrie MustacheJADALI, FAYEGH Treating Provider/Extender: Rudene ReBritto, Aldena Worm Weeks in Treatment: 5 Information Obtained from: Patient Chief Complaint patient is here for evaluation of left lower extremity ulcers Electronic Signature(s) Signed: 06/26/2016 11:56:35 AM By: Evlyn KannerBritto, Kenedee Molesky MD, FACS Entered By: Evlyn KannerBritto, Saydie Gerdts on 06/26/2016 11:56:34 [name], Avyn N. ([id number]) -------------------------------------------------------------------------------- HPI Details Patient Name: [name], [name] Date of Service: 06/26/2016 11:45 AM Medical Record Number: [id number] Patient Account Number: [id number] Date of Birth/Sex: 07-Jan-1946 4(71 y.o. Female) Treating RN: Curtis Sitesorthy, Joanna Primary Care Provider: Sherrie MustacheJADALI, FAYEGH Other Clinician: Referring Provider: Sherrie MustacheJADALI, FAYEGH Treating Provider/Extender: Rudene ReBritto, Elwood Bazinet Weeks in Treatment: 5 History of Present Illness Location: left lower extremity Duration: left lower extremity ulcers have been present for approximately 2 weeks prior to initial evaluation at the wound center (blisters formed approx 3 weeks ago) and the left forearm for less than 1 week Context: the left lower extremity ulcers are secondary to edema and spontaneous blister formation HPI Description: Pleasant 71 year old with diabetes (no hemoglobin A1c available) and severe peripheral vascular disease. She reports a prior left lower extremity stent. Records unavailable. Underwent multiple level angioplasty of left lower extremity 10/29/2014 by Dr. Wyn Quakerew. Developed left calf ulcerations in late December 2016. Denies any trauma. Seen by her PCP.  Bactrim prescribed. Referred to the wound clinic. Biopsy culture 02/20/2015 grew methicillin sensitive staph aureus, sensitive to Bactrim. Performing dressing changes with Prisma. Using a Tubigrip for edema control. Scheduled to see Dr. Wyn Quakerew in follow-up 02/25/2015. Cancelled because of weather. Rescheduled for 03/06/2015. She returns to clinic for follow-up and is without new complaints. Pain improved. No ischemic rest pain. No fever or chills. Minimal drainage. 03/13/2015 -- records obtained from [facility] shows that she was seen by Dr. dew on 03/12/2015 and given tramadol and other symptomatic treatment. Recent ABI done showed right to be 0.94 and left to be 0.47 a left lower extremity arterial duplex was notable for an occluded stent and distal ATA. Recommendations were for left lower extremity angiogram with intervention. on 03/11/2015 she was taken up for a aortogram with angioplasty of the left posterior tibial artery and tibioperoneal trunk, above-knee popliteal artery and almost entire SFA, stent placement to the SFA and popliteal artery. 03/20/15 her arterial interventions noted. The patient states her left leg feels better. He ischemic wound on her left anterior leg. She has been using Santyl 04/09/15; the patient arrives back today. 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 dew. I was concerned about her last week as she had weeping edema fluid and areas of threatened ulceration around her wound. I ordered a silver alginate, Profore light wrap. She comes back today with an Radio broadcast assistantUnna boot on apparently applied by Dr. Driscilla Grammesdew's office. Her edema is under much better control and the weeping area here last week surrounding her major wound has resolved. She does have another open area which is small and superficial. I'm not sure I would put an Unna boot on this lady's degree of arterial insufficiency. 04/16/15; her original small  wound appears to be healthy with good granulation her measurements are improved. She has a small area underneath this. Both underwent selective debridement since surface slough 04/23/15; the original wound has filled  in there is healthy granulation here. The small area underneath this is healed. No debridement was required 04/30/15; the wound no longer has any depth. no Debridement required MADINAH, QUARRY ([id number]) 05/07/15; the wound appears to be stable. Advancing epithelialization. Decrease wound volume. No debridement is required. She has known arterial disease status post stent placement to the SFA and popliteal artery. 05/14/15 unfortunately the wound is not really decreased much this week she still has a rim of epithelialization. The wound bed looks stable nevertheless I did a surgical debridement and there is some adherent surface slough. She has known PAD however the blood supply appears to be adequate status post stent to the SFA and popliteal and popliteal artery 05/21/15 wound is healthier than last week. No debridement is necessary. Known PAD status post stent to the SFA and popliteal artery 05/28/15; wound is no different from last week and in fact looks "stalled". Fairly we put Iodoflex on him last week however home health used Prisma 06/04/15 if anything the wound area appears to be larger. 06/11/15 switch to silver alginate last week and this seems to have helped. There is a thin layer of epithelialization only visible under the light 06/18/15; the patient had her leg traumatized when a grandchild fell over her leg at a party last weekend. Her original wound is just about closed very tiny however she has a new larger superficial wound just distal to the original wound. No evidence of infection 06/25/15; both of the wounds on her left leg including the chronic wound we have been treating her for a prolonged period of time now, and the traumatic wound from last week both are fully  epithelialized. She apparently had a slip this week and managed to lift her nail off her right great toe. 07/02/15; patient's legs are carefully inspected. Her original wound on the left medial leg is completely healed. Traumatic area on her right great toenail from last week is also healed. She has combined venous and arterial insufficiency we have obtained her juxtalite stockings bilaterally. 07/16/15 this is a patient we recently discharged. She states that the juxtalite stocking cause irritation and blistering on her anterior left leg. She is not therefore used them in the last 6 days 07/23/15; once again the patient has no open area here. Her edema is well controlled. She is very reluctant to try the juxtalite's again because she attributes this to the blistering on her left anterior leg. Both the medial original wound, and the blister on the anterior left leg give healed === 05/19/16- the patient is here for initial evaluation of her left lower extremity ulcers and a burn to the left forearm. She states that upon discharge last year she was given juxta light compression wraps, she could not tolerate those and was given compression stockings which she recently converted to compression socks. She states that the compression socks are "medium" and is unable to articulate the exact millimeters of mercury. She states that the left lower extremity developed spontaneous blisters which eventually ulcerated 2 weeks ago. She has not been wearing compression stockings or socks to either extremity since this occurred. She states that she burned her left forearm on a hot pan while cooking for [employer]. She has been applying no topical agents to either. She is a diabetic, she is unaware of her recent A1c but states her morning glucose was 197. She has a follow-up with her PCP, Dr.Jadali this Friday. She has known mixed arterial and venous disease. 05/27/16;  the patient has a burn suffered on her left arm  while moving pots on her stove. She has been using hydrogel to this. She also has venous insufficiency wounds on the left lower extremity. These apparently occurred while she was wearing some form of compression although I'm really not sure how aggressive the compression was. I suspect this was inadequate to maintain her edema control. Nevertheless wasn't really clear to me after talking to her today. 06/03/16; the patient's burn injury on her left arm is fully resolved. Venous insufficiency wound in the left lower extremity also looks considerably better under compression. In discussion today and is not really clear what stocking she had at home. She did have juxtalite stockings but this caused a severe contact dermatitis type rash therefore she is not eligible for this which is unfortunate 06/12/16 Patient appears to be doing very well in her leftover study wounds have completely healed on MELODI, HAPPEL N. ([id number]) evaluation today. She has been tolerating her compression at home although we do have a two layer compression hose that has been ordered and will be applied going forward today. She is pleased with how this progress. 06/26/2016 -- the patient has been using her dual layer compression stockings as done well with this. We spent some time discussing the right way to use it the timing to remove it and have discussed elevation and exercise in great detail. She will also follow-up with Dr. Wyn Quaker has an appointment to see her in the first week of June Electronic Signature(s) Signed: 06/26/2016 11:57:26 AM By: Evlyn Kanner MD, FACS Entered By: Evlyn Kanner on 06/26/2016 11:57:26 Routt, [name] ([id number]) -------------------------------------------------------------------------------- Physical Exam Details Patient Name: [name] [name] Date of Service: 06/26/2016 11:45 AM Medical Record Number: [id number] Patient Account Number: [id number] Date of Birth/Sex: [birth year] (71 y.o.  Female) Treating RN: Curtis Sites Primary Care Provider: Sherrie Mustache Other Clinician: Referring Provider: Sherrie Mustache Treating Provider/Extender: Rudene Re in Treatment: 5 Constitutional . Pulse regular. Respirations normal and unlabored. Afebrile. . Eyes Nonicteric. Reactive to light. Ears, Nose, Mouth, and Throat Lips, teeth, and gums WNL.Marland Kitchen Moist mucosa without lesions. Neck supple and nontender. No palpable supraclavicular or cervical adenopathy. Normal sized without goiter. Respiratory WNL. No retractions.. Cardiovascular Pedal Pulses WNL. No clubbing, cyanosis or edema. Gastrointestinal (GI) Abdomen without masses or tenderness.. No liver or spleen enlargement or tenderness.. Genitourinary (GU) No hydrocele, spermatocele, tenderness of the cord, or testicular mass.Marland Kitchen Penis without lesions.Renetta Chalk without lesions. No cystocele, or rectocele. Pelvic support intact, no discharge.Marland Kitchen Urethra without masses, tenderness or scarring.Marland Kitchen Lymphatic No adneopathy. No adenopathy. No adenopathy. Musculoskeletal Adexa without tenderness or enlargement.. Digits and nails w/o clubbing, cyanosis, infection, petechiae, ischemia, or inflammatory conditions.. Integumentary (Hair, Skin) No suspicious lesions. No crepitus or fluctuance. No peri-wound warmth or erythema. No masses.Marland Kitchen Psychiatric Judgement and insight Intact.. No evidence of depression, anxiety, or agitation.. Notes the left lower extremity has some lymphedema as she did not wear her compression stockings this morning. The wounds have healed however Electronic Signature(s) Signed: 06/26/2016 11:57:50 AM By: Evlyn Kanner MD, FACS Entered By: Evlyn Kanner on 06/26/2016 11:57:50 Sherburne, SHAGUN WORDELL ([id number]) MARLOWE, CINQUEMANI ([id number]) -------------------------------------------------------------------------------- Physician Orders Details Patient Name: [name] [name] Date of Service: 06/26/2016 11:45 AM Medical  Record Number: [id number] Patient Account Number: [id number] Date of Birth/Sex: January 27, 1946 (71 y.o. Female) Treating RN: Curtis Sites Primary Care Provider: Sherrie Mustache Other Clinician: Referring Provider: Sherrie Mustache Treating Provider/Extender: Rudene Re in Treatment: 5 Verbal / Phone Orders:  No Diagnosis Coding Discharge From [facility] o Discharge from Wound Care Center Electronic Signature(s) Signed: 06/26/2016 2:17:05 PM By: Curtis Sites Signed: 06/26/2016 3:55:17 PM By: Evlyn Kanner MD, FACS Entered By: Curtis Sites on 06/26/2016 11:52:14 Moffitt, [name] ([id number]) -------------------------------------------------------------------------------- Problem List Details Patient Name: [name] [name] Date of Service: 06/26/2016 11:45 AM Medical Record Number: [id number] Patient Account Number: [id number] Date of Birth/Sex: 05-Jan-1946 (71 y.o. 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 Female) Treating RN: Curtis Sites Primary Care Provider: Sherrie Mustache Other Clinician: Referring Provider: Sherrie Mustache Treating Provider/Extender: Rudene Re in Treatment: 5 Subjective Chief Complaint Information obtained from Patient patient is here for evaluation of left lower extremity ulcers History of Present Illness (HPI) The following HPI elements were documented for the patient's wound: Location: left lower extremity Duration: left lower extremity ulcers have been present for approximately 2 weeks prior to initial evaluation at the wound center (blisters formed approx 3 weeks ago) and the left forearm for less than 1 week Context: the left lower extremity ulcers are secondary to edema and spontaneous blister formation Pleasant 71 year old with diabetes (no hemoglobin A1c available) and severe peripheral vascular disease. She reports a prior left lower extremity stent. Records unavailable. Underwent multiple level angioplasty of left lower extremity 10/29/2014 by Dr. Wyn Quaker. Developed left calf ulcerations in late December 2016. Denies any trauma. Seen by her PCP. Bactrim prescribed. Referred to the wound clinic. Biopsy culture 02/20/2015 grew methicillin sensitive staph aureus, sensitive to Bactrim. Performing dressing changes with Prisma. Using a Tubigrip for edema control. Scheduled to see Dr. Wyn Quaker in follow-up 02/25/2015. Cancelled because of weather. Rescheduled for 03/06/2015. She returns to clinic for follow-up and is without new complaints. Pain improved. No ischemic rest pain. No fever or chills. Minimal drainage. 03/13/2015 -- records obtained from [facility] shows that she was seen by Dr. dew on 03/12/2015 and given tramadol and other symptomatic treatment. Recent ABI done showed right to be 0.94 and left to be 0.47 a left lower extremity arterial duplex was notable for an  occluded stent and distal ATA. Recommendations were for left lower extremity angiogram with intervention. on 03/11/2015 she was taken up for a aortogram with angioplasty of the left posterior tibial artery and tibioperoneal trunk, above-knee popliteal artery and almost entire SFA, stent placement to the SFA and popliteal artery. 03/20/15 her arterial interventions noted. The patient states her left leg feels better. He ischemic wound on her left anterior leg. She has been using Santyl 04/09/15; the patient arrives back today. 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 dew. I was concerned about her last week as she had weeping edema fluid and areas  of threatened ulceration around her wound. I ordered a silver alginate, Profore light wrap. She comes back today with an Radio broadcast assistant on apparently applied by Dr. Driscilla Grammes office. Her edema is under much better control and the weeping area here last week surrounding her major wound has resolved. She does have another open area which is small and superficial. I'm not sure I would put an Unna boot on this lady's KIONNA, BRIER N. ([id number]) degree of arterial insufficiency. 04/16/15; her original small wound appears to be healthy with good granulation her measurements are improved. She has a small area underneath this. Both underwent selective debridement since surface slough 04/23/15; the original wound has filled in there is healthy granulation here. The small area underneath this is healed. No debridement was required 04/30/15; the wound no longer has any depth. no Debridement required 05/07/15; the wound appears to be stable. Advancing epithelialization. Decrease wound volume. No debridement is required. She has known arterial disease status post stent placement to the SFA and popliteal artery. 05/14/15 unfortunately the wound is not really decreased much this week she still has a rim of epithelialization. The  wound bed looks stable nevertheless I did a surgical debridement and there is some adherent surface slough. She has known PAD however the blood supply appears to be adequate status post stent to the SFA and popliteal and popliteal artery 05/21/15 wound is healthier than last week. No debridement is necessary. Known PAD status post stent to the SFA and popliteal artery 05/28/15; wound is no different from last week and in fact looks "stalled". Fairly we put Iodoflex on him last week however home health used Prisma 06/04/15 if anything the wound area appears to be larger. 06/11/15 switch to silver alginate last week and this seems to have helped. There is a thin layer of epithelialization only visible under the light 06/18/15; the patient had her leg traumatized when a grandchild fell over her leg at a party last weekend. Her original wound is just about closed very tiny however she has a new larger superficial wound just distal to the original wound. No evidence of infection 06/25/15; both of the wounds on her left leg including the chronic wound we have been treating her for a prolonged period of time now, and the traumatic wound from last week both are fully epithelialized. She apparently had a slip this week and managed to lift her nail off her right great toe. 07/02/15; patient's legs are carefully inspected. Her original wound on the left medial leg is completely healed. Traumatic area on her right great toenail from last week is also healed. She has combined venous and arterial insufficiency we have obtained her juxtalite stockings bilaterally. 07/16/15 this is a patient we recently discharged. She states that the juxtalite stocking cause irritation and blistering on her anterior left leg. She is not therefore used them in the last 6 days 07/23/15; once again the patient has no open area here. Her edema is well controlled. She is very reluctant to try the juxtalite's again because she attributes this  to the blistering on her left anterior leg. Both the medial original wound, and the blister on the anterior left leg give healed === 05/19/16- the patient is here for initial evaluation of her left lower extremity ulcers and a burn to the left forearm. She states that upon discharge last year she was given juxta light compression wraps, she could not tolerate those and was given compression stockings which she recently converted to compression  socks. She states that the compression socks are "medium" and is unable to articulate the exact millimeters of mercury. She states that the left lower extremity developed spontaneous blisters which eventually ulcerated 2 weeks ago. She has not been wearing compression stockings or socks to either extremity since this occurred. She states that she burned her left forearm on a hot pan while cooking for [employer]. She has been applying no topical agents to either. She is a diabetic, she is unaware of her recent A1c but states her morning glucose was 197. She has a follow-up with her PCP, Dr.Jadali this Friday. She has known mixed arterial and venous disease. 05/27/16; the patient has a burn suffered on her left arm while moving pots on her stove. She has been using hydrogel to this. She also has venous insufficiency wounds on the left lower extremity. These apparently occurred while she was wearing some form of compression although I'm really not sure how aggressive the compression was. I suspect this was inadequate to maintain her edema control. Nevertheless wasn't really REBECCA, CAIRNS. ([id number]) clear to me after talking to her today. 06/03/16; the patient's burn injury on her left arm is fully resolved. Venous insufficiency wound in the left lower extremity also looks considerably better under compression. In discussion today and is not really clear what stocking she had at home. She did have juxtalite stockings but this caused a severe  contact dermatitis type rash therefore she is not eligible for this which is unfortunate 06/12/16 Patient appears to be doing very well in her leftover study wounds have completely healed on evaluation today. She has been tolerating her compression at home although we do have a two layer compression hose that has been ordered and will be applied going forward today. She is pleased with how this progress. 06/26/2016 -- the patient has been using her dual layer compression stockings as done well with this. We spent some time discussing the right way to use it the timing to remove it and have discussed elevation and exercise in great detail. She will also follow-up with Dr. Wyn Quaker has an appointment to see her in the first week of June Objective Constitutional Pulse regular. Respirations normal and unlabored. Afebrile. Vitals Time Taken: 11:44 AM, Height: 68 in, Weight: 300 lbs, BMI: 45.6, Temperature: 98.2 F, Pulse: 71 bpm, Respiratory Rate: 18 breaths/min, Blood Pressure: 122/53 mmHg. Eyes Nonicteric. Reactive to light. Ears, Nose, Mouth, and Throat Lips, teeth, and gums WNL.Marland Kitchen Moist mucosa without lesions. Neck supple and nontender. No palpable supraclavicular or cervical adenopathy. Normal sized without goiter. Respiratory WNL. No retractions.. Cardiovascular Pedal Pulses WNL. No clubbing, cyanosis or edema. Gastrointestinal (GI) Abdomen without masses or tenderness.. No liver or spleen enlargement or tenderness.. Genitourinary (GU) No hydrocele, spermatocele, tenderness of the cord, or testicular mass.Marland Kitchen Penis without lesions.Renetta Chalk without lesions. No cystocele, or rectocele. Pelvic support intact, no discharge.Marland Kitchen Urethra without masses, Sistrunk, Rasheen N. ([id number]) tenderness or scarring.Marland Kitchen Lymphatic No adneopathy. No adenopathy. No adenopathy. Musculoskeletal Adexa without tenderness or enlargement.. Digits and nails w/o clubbing, cyanosis, infection, petechiae, ischemia, or  inflammatory conditions.Marland Kitchen Psychiatric Judgement and insight Intact.. No evidence of depression, anxiety, or agitation.. General Notes: the left lower extremity has some lymphedema as she did not wear her compression stockings this morning. The wounds have healed however Integumentary (Hair, Skin) No suspicious lesions. No crepitus or fluctuance. No peri-wound warmth or erythema. No masses..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

## 2016-07-02 DIAGNOSIS — L97929 Non-pressure chronic ulcer of unspecified part of left lower leg with unspecified severity: Secondary | ICD-10-CM | POA: Diagnosis not present

## 2016-07-02 DIAGNOSIS — I83029 Varicose veins of left lower extremity with ulcer of unspecified site: Secondary | ICD-10-CM | POA: Diagnosis not present

## 2016-07-22 ENCOUNTER — Ambulatory Visit (INDEPENDENT_AMBULATORY_CARE_PROVIDER_SITE_OTHER): Payer: Medicare Other | Admitting: Vascular Surgery

## 2016-07-22 ENCOUNTER — Ambulatory Visit (INDEPENDENT_AMBULATORY_CARE_PROVIDER_SITE_OTHER): Payer: Medicare Other

## 2016-07-22 ENCOUNTER — Encounter (INDEPENDENT_AMBULATORY_CARE_PROVIDER_SITE_OTHER): Payer: Self-pay | Admitting: Vascular Surgery

## 2016-07-22 VITALS — BP 150/66 | HR 89 | Resp 16 | Wt 305.0 lb

## 2016-07-22 DIAGNOSIS — I739 Peripheral vascular disease, unspecified: Secondary | ICD-10-CM

## 2016-07-22 DIAGNOSIS — R600 Localized edema: Secondary | ICD-10-CM

## 2016-07-22 NOTE — Progress Notes (Signed)
Subjective:    Patient ID: [name], female    DOB: 07/13/1945, 71 y.o.   MRN: [id number] Chief Complaint  Patient presents with  . Follow-up   Patient presents for 6 month peripheral artery disease follow-up she is without complaint with the exception of experiencing bilateral lower extremity edema. Over the last few weeks she has noticed her left lower extremity is more "swollen" in the right over the last week she has developed "blisters" which will then pop and drain a clear fluid. The patient wears what sounds like bilateral wraps. She has not been wearing them since the development of blisters on her left lower extremity. She denies any fever or nausea or vomiting. The patient underwent an ABI which was notable for right ankle brachial index suggesting mild right lower extremity arterial disease. The left ankle brachial index was obtained due to "wounds". Bilateral toe brachial indices are normal. This is stable from the previous ABI on 04/04/2016 the patient also underwent a bilateral lower extremity arterial duplex exam which was notable for elevated velocities in the deep right femoral artery suggesting 50-74% stenosis. Elevated velocities of the right popliteal artery suggesting 50-74% stenosis. When compared to the 04/04/2005 arterial duplex this is a progression of atherosclerotic disease. Patient denies experiencing claudication, rest pain, ulcerations to the toes.   Review of Systems  Constitutional: Negative.   HENT: Negative.   Eyes: Negative.   Respiratory: Negative.   Cardiovascular: Positive for leg swelling.  Gastrointestinal: Negative.   Endocrine: Negative.   Genitourinary: Negative.   Musculoskeletal: Negative.   Skin: Negative.   Allergic/Immunologic: Negative.   Neurological: Negative.   Hematological: Negative.   Psychiatric/Behavioral: Negative.       Objective:   Physical Exam  Constitutional: She is oriented to person, place, and time. She appears  well-developed and well-nourished. No distress.  Morbidly obese  HENT:  Head: Normocephalic and atraumatic.  Eyes: Conjunctivae are normal. Pupils are equal, round, and reactive to light.  Neck: Normal range of motion.  Cardiovascular: Normal rate, regular rhythm and normal heart sounds.   Pulses:      Radial pulses are 2+ on the right side, and 2+ on the left side.  Unable to obtain pedal pulses due to edema body habitus. The bilateral feet are warm. No ulcerations are noted.  Pulmonary/Chest: Effort normal.  Musculoskeletal: Normal range of motion. She exhibits edema (Bilateral moderate pitting edema noted.).  Neurological: She is alert and oriented to person, place, and time.  Skin: She is not diaphoretic.     Left lower extremity: The patient has 3 scattered very superficial blisters. No wheezing is noted at this time. No infection or cellulitis noted.  Psychiatric: She has a normal mood and affect. Her behavior is normal. Judgment and thought content normal.    BP (!) 150/66   Pulse 89   Resp 16   Wt (!) 305 lb (138.3 kg)   BMI 46.38 kg/m   Past Medical History:  Diagnosis Date  . Anemia    as young child  . Arthritis    back, leg and foot  . Blood transfusion    1964  . Carpal tunnel syndrome   . Diabetes mellitus    oral medications  . Hypertension     Social History   Social History  . Marital status: Married    Spouse name: N/A  . Number of children: N/A  . Years of education: N/A   Occupational History  . Not  on file.   Social History Main Topics  . Smoking status: Never Smoker  . Smokeless tobacco: Never Used  . Alcohol use No  . Drug use: No  . Sexual activity: Not on file   Other Topics Concern  . Not on file   Social History Narrative  . No narrative on file    Past Surgical History:  Procedure Laterality Date  . ABDOMINAL HYSTERECTOMY    . ANTERIOR CERVICAL DECOMP/DISCECTOMY FUSION  04/27/2011   Procedure: ANTERIOR CERVICAL  DECOMPRESSION/DISCECTOMY FUSION 2 LEVELS;  Surgeon: Mariam Dollar, MD;  Location: [facility];  Service: Neurosurgery;  Laterality: N/A;  Anterior Cervical Decompression/ Discectomy Fusion Cervical Three-Four, Cervical Four-Five  . APPENDECTOMY    . KNEE ARTHROPLASTY     right  . PERIPHERAL VASCULAR CATHETERIZATION Left 10/29/2014   Procedure: Lower Extremity Angiography;  Surgeon: Annice Needy, MD;  Location: [facility] INVASIVE CV LAB;  Service: Cardiovascular;  Laterality: Left;  . PERIPHERAL VASCULAR CATHETERIZATION Left 10/29/2014   Procedure: Lower Extremity Intervention;  Surgeon: Annice Needy, MD;  Location: [facility] INVASIVE CV LAB;  Service: Cardiovascular;  Laterality: Left;  . PERIPHERAL VASCULAR CATHETERIZATION Left 03/11/2015   Procedure: Lower Extremity Angiography;  Surgeon: Annice Needy, MD;  Location: [facility] INVASIVE CV LAB;  Service: Cardiovascular;  Laterality: Left;  . PERIPHERAL VASCULAR CATHETERIZATION Left 03/11/2015   Procedure: Lower Extremity Intervention;  Surgeon: Annice Needy, MD;  Location: [facility] INVASIVE CV LAB;  Service: Cardiovascular;  Laterality: Left;    No family history on file.  Allergies  Allergen Reactions  . Penicillins Shortness Of Breath and Swelling  . Codeine Other (See Comments)    Ringing in ear       Assessment & Plan:  Patient presents for 6 month peripheral artery disease follow-up she is without complaint with the exception of experiencing bilateral lower extremity edema. Over the last few weeks she has noticed her left lower extremity is more "swollen" in the right over the last week she has developed "blisters" which will then pop and drain a clear fluid. The patient wears what sounds like bilateral wraps. She has not been wearing them since the development of blisters on her left lower extremity. She denies any fever or nausea or vomiting. The patient underwent an ABI which was notable for right ankle brachial index suggesting mild right lower extremity  arterial disease. The left ankle brachial index was obtained due to "wounds". Bilateral toe brachial indices are normal. This is stable from the previous ABI on 04/04/2016 the patient also underwent a bilateral lower extremity arterial duplex exam which was notable for elevated velocities in the deep right femoral artery suggesting 50-74% stenosis. Elevated velocities of the right popliteal artery suggesting 50-74% stenosis. When compared to the 04/04/2005 arterial duplex this is a progression of atherosclerotic disease. Patient denies experiencing claudication, rest pain, ulcerations to the toes.  1. PAD (peripheral artery disease) (HCC) - stable Patient is asymptomatic at this time. ABI and arterial duplex a relatively stable when compared to previous. No indication for intervention noted at this time. We'll bring the patient back in 6 months for close monitoring I have discussed with the patient at length the risk factors for and pathogenesis of atherosclerotic disease and encouraged a healthy diet, regular exercise regimen and blood pressure / glucose control.  The patient was encouraged to call the office in the interim if he experiences any claudication like symptoms, rest pain or ulcers to his feet / toes.  -  VAS US ABI WITH/WO TBI; Future - VAS US LOWER EXTREMITY ARTERIAL DUPLEX; Future  2. Bilateral lower extremity edema - New Patient with pitting bilateral edema. Patient has been wearing what sounds like our wraps however it is experiencing an exacerbation of her lymphedema. Patient has weeping blisters of the left lower extremity. Placed patient in left lower extremity when a wraps for 4 weeks.  Follow up in 1 month to assess edema and wound Patient will continue to engage in bilateral elevation of her lower extremities. Patient will probably benefit from a lymphedema pump in the near future. Patient given prescription for home nursing services to place a 3 layer wrap to the left  lower extremity. Since husband receives home nursing services for a wraps and she would like the same. If she is unable to obtain home nursing services for on the wrap changes she will come to our office on a weekly basis for them  Current Outpatient Prescriptions on File Prior to Visit  Medication Sig Dispense Refill  . celecoxib (CELEBREX) 200 MG capsule Take 200 mg by mouth daily.    Marland Kitchen. glipiZIDE (GLUCOTROL XL) 5 MG 24 hr tablet Take 5 mg by mouth 2 (two) times daily.    . Liraglutide (VICTOZA) 18 MG/3ML SOPN Inject into the skin.    Marland Kitchen. lisinopril-hydrochlorothiazide (PRINZIDE,ZESTORETIC) 20-25 MG per tablet Take 1 tablet by mouth daily.    . metFORMIN (GLUCOPHAGE) 1000 MG tablet Take 1,000 mg by mouth 2 (two) times daily with a meal.    . metoprolol succinate (TOPROL-XL) 25 MG 24 hr tablet Take 25 mg by mouth daily.    Marland Kitchen. omeprazole (PRILOSEC) 20 MG capsule Take 20 mg by mouth daily.    . pregabalin (LYRICA) 25 MG capsule Take 25 mg by mouth 2 (two) times daily.    . rosuvastatin (CRESTOR) 5 MG tablet Take 5 mg by mouth daily.    . saxagliptin HCl (ONGLYZA) 5 MG TABS tablet Take 5 mg by mouth daily.    . benzonatate (TESSALON PERLES) 100 MG capsule Take 1 capsule (100 mg total) by mouth 3 (three) times daily as needed for cough. (Patient not taking: Reported on 07/22/2016) 20 capsule 0  . clopidogrel (PLAVIX) 75 MG tablet Take 1 tablet (75 mg total) by mouth daily. (Patient not taking: Reported on 07/22/2016) 30 tablet 11  . cyclobenzaprine (FLEXERIL) 10 MG tablet Take 10 mg by mouth 3 (three) times daily as needed. For muscle pain    . naproxen (NAPROSYN) 375 MG tablet Take 375 mg by mouth 3 (three) times daily with meals.    . predniSONE (DELTASONE) 10 MG tablet Take 10-40 mg by mouth daily. Reported on 03/11/2015    . sulfamethoxazole-trimethoprim (BACTRIM DS,SEPTRA DS) 800-160 MG tablet Take 1 tablet by mouth 2 (two) times daily.     No current facility-administered medications on file prior  to visit.     There are no Patient Instructions on file for this visit. No Follow-up on file.   Zamani Crocker A Marcy Bogosian, PA-C

## 2016-08-03 ENCOUNTER — Telehealth (INDEPENDENT_AMBULATORY_CARE_PROVIDER_SITE_OTHER): Payer: Self-pay | Admitting: Vascular Surgery

## 2016-08-03 NOTE — Telephone Encounter (Signed)
Patient called and stated that her home health has not been out to wrap her legs and the last time she had them wrapped was her last visit here. She said she contacted them and they said they had the order but no notes.

## 2016-08-03 NOTE — Telephone Encounter (Signed)
[facility] and spoke with Starlett ([phone])  the manager and all she needed was clarification to start the wraps.I called the patient back and inform her that the office should send a nurse to start her wraps.

## 2016-08-04 DIAGNOSIS — N189 Chronic kidney disease, unspecified: Secondary | ICD-10-CM | POA: Diagnosis not present

## 2016-08-04 DIAGNOSIS — E1122 Type 2 diabetes mellitus with diabetic chronic kidney disease: Secondary | ICD-10-CM | POA: Diagnosis not present

## 2016-08-04 DIAGNOSIS — M1991 Primary osteoarthritis, unspecified site: Secondary | ICD-10-CM | POA: Diagnosis not present

## 2016-08-04 DIAGNOSIS — Z7902 Long term (current) use of antithrombotics/antiplatelets: Secondary | ICD-10-CM | POA: Diagnosis not present

## 2016-08-04 DIAGNOSIS — Z794 Long term (current) use of insulin: Secondary | ICD-10-CM | POA: Diagnosis not present

## 2016-08-04 DIAGNOSIS — Z9181 History of falling: Secondary | ICD-10-CM | POA: Diagnosis not present

## 2016-08-04 DIAGNOSIS — I890 Lymphedema, not elsewhere classified: Secondary | ICD-10-CM | POA: Diagnosis not present

## 2016-08-04 DIAGNOSIS — I129 Hypertensive chronic kidney disease with stage 1 through stage 4 chronic kidney disease, or unspecified chronic kidney disease: Secondary | ICD-10-CM | POA: Diagnosis not present

## 2016-08-04 DIAGNOSIS — E1142 Type 2 diabetes mellitus with diabetic polyneuropathy: Secondary | ICD-10-CM | POA: Diagnosis not present

## 2016-08-04 DIAGNOSIS — E1151 Type 2 diabetes mellitus with diabetic peripheral angiopathy without gangrene: Secondary | ICD-10-CM | POA: Diagnosis not present

## 2016-08-10 DIAGNOSIS — E1151 Type 2 diabetes mellitus with diabetic peripheral angiopathy without gangrene: Secondary | ICD-10-CM | POA: Diagnosis not present

## 2016-08-10 DIAGNOSIS — Z7902 Long term (current) use of antithrombotics/antiplatelets: Secondary | ICD-10-CM | POA: Diagnosis not present

## 2016-08-10 DIAGNOSIS — N189 Chronic kidney disease, unspecified: Secondary | ICD-10-CM | POA: Diagnosis not present

## 2016-08-10 DIAGNOSIS — Z9181 History of falling: Secondary | ICD-10-CM | POA: Diagnosis not present

## 2016-08-10 DIAGNOSIS — E1122 Type 2 diabetes mellitus with diabetic chronic kidney disease: Secondary | ICD-10-CM | POA: Diagnosis not present

## 2016-08-10 DIAGNOSIS — I129 Hypertensive chronic kidney disease with stage 1 through stage 4 chronic kidney disease, or unspecified chronic kidney disease: Secondary | ICD-10-CM | POA: Diagnosis not present

## 2016-08-10 DIAGNOSIS — Z794 Long term (current) use of insulin: Secondary | ICD-10-CM | POA: Diagnosis not present

## 2016-08-10 DIAGNOSIS — E1142 Type 2 diabetes mellitus with diabetic polyneuropathy: Secondary | ICD-10-CM | POA: Diagnosis not present

## 2016-08-10 DIAGNOSIS — M1991 Primary osteoarthritis, unspecified site: Secondary | ICD-10-CM | POA: Diagnosis not present

## 2016-08-10 DIAGNOSIS — I890 Lymphedema, not elsewhere classified: Secondary | ICD-10-CM | POA: Diagnosis not present

## 2016-08-17 DIAGNOSIS — E1142 Type 2 diabetes mellitus with diabetic polyneuropathy: Secondary | ICD-10-CM | POA: Diagnosis not present

## 2016-08-17 DIAGNOSIS — E1122 Type 2 diabetes mellitus with diabetic chronic kidney disease: Secondary | ICD-10-CM | POA: Diagnosis not present

## 2016-08-17 DIAGNOSIS — N189 Chronic kidney disease, unspecified: Secondary | ICD-10-CM | POA: Diagnosis not present

## 2016-08-17 DIAGNOSIS — M1991 Primary osteoarthritis, unspecified site: Secondary | ICD-10-CM | POA: Diagnosis not present

## 2016-08-17 DIAGNOSIS — Z794 Long term (current) use of insulin: Secondary | ICD-10-CM | POA: Diagnosis not present

## 2016-08-17 DIAGNOSIS — E1151 Type 2 diabetes mellitus with diabetic peripheral angiopathy without gangrene: Secondary | ICD-10-CM | POA: Diagnosis not present

## 2016-08-17 DIAGNOSIS — I129 Hypertensive chronic kidney disease with stage 1 through stage 4 chronic kidney disease, or unspecified chronic kidney disease: Secondary | ICD-10-CM | POA: Diagnosis not present

## 2016-08-17 DIAGNOSIS — Z7902 Long term (current) use of antithrombotics/antiplatelets: Secondary | ICD-10-CM | POA: Diagnosis not present

## 2016-08-17 DIAGNOSIS — Z9181 History of falling: Secondary | ICD-10-CM | POA: Diagnosis not present

## 2016-08-17 DIAGNOSIS — I890 Lymphedema, not elsewhere classified: Secondary | ICD-10-CM | POA: Diagnosis not present

## 2016-08-24 DIAGNOSIS — M1991 Primary osteoarthritis, unspecified site: Secondary | ICD-10-CM | POA: Diagnosis not present

## 2016-08-24 DIAGNOSIS — N189 Chronic kidney disease, unspecified: Secondary | ICD-10-CM | POA: Diagnosis not present

## 2016-08-24 DIAGNOSIS — I129 Hypertensive chronic kidney disease with stage 1 through stage 4 chronic kidney disease, or unspecified chronic kidney disease: Secondary | ICD-10-CM | POA: Diagnosis not present

## 2016-08-24 DIAGNOSIS — E1142 Type 2 diabetes mellitus with diabetic polyneuropathy: Secondary | ICD-10-CM | POA: Diagnosis not present

## 2016-08-24 DIAGNOSIS — Z7902 Long term (current) use of antithrombotics/antiplatelets: Secondary | ICD-10-CM | POA: Diagnosis not present

## 2016-08-24 DIAGNOSIS — I890 Lymphedema, not elsewhere classified: Secondary | ICD-10-CM | POA: Diagnosis not present

## 2016-08-24 DIAGNOSIS — E1122 Type 2 diabetes mellitus with diabetic chronic kidney disease: Secondary | ICD-10-CM | POA: Diagnosis not present

## 2016-08-24 DIAGNOSIS — Z9181 History of falling: Secondary | ICD-10-CM | POA: Diagnosis not present

## 2016-08-24 DIAGNOSIS — E1151 Type 2 diabetes mellitus with diabetic peripheral angiopathy without gangrene: Secondary | ICD-10-CM | POA: Diagnosis not present

## 2016-08-24 DIAGNOSIS — Z794 Long term (current) use of insulin: Secondary | ICD-10-CM | POA: Diagnosis not present

## 2016-08-25 ENCOUNTER — Ambulatory Visit (INDEPENDENT_AMBULATORY_CARE_PROVIDER_SITE_OTHER): Payer: Medicare Other | Admitting: Vascular Surgery

## 2016-08-25 ENCOUNTER — Encounter (INDEPENDENT_AMBULATORY_CARE_PROVIDER_SITE_OTHER): Payer: Self-pay | Admitting: Vascular Surgery

## 2016-08-25 VITALS — BP 132/64 | HR 81 | Resp 16 | Ht 67.0 in | Wt 300.0 lb

## 2016-08-25 DIAGNOSIS — I10 Essential (primary) hypertension: Secondary | ICD-10-CM | POA: Diagnosis not present

## 2016-08-25 DIAGNOSIS — I739 Peripheral vascular disease, unspecified: Secondary | ICD-10-CM | POA: Diagnosis not present

## 2016-08-25 DIAGNOSIS — R600 Localized edema: Secondary | ICD-10-CM | POA: Diagnosis not present

## 2016-08-25 NOTE — Assessment & Plan Note (Signed)
Recently checked and stable.  

## 2016-08-25 NOTE — Progress Notes (Signed)
MRN : [id number]  [name] is a 71 y.o. ([birth year]) female who presents with chief complaint of  Chief Complaint  Patient presents with  . Leg Swelling    Unna check  .  History of Present Illness: Patient returns today in follow up of leg swelling and ulceration.  After several weeks in Unna boots, this has gotten much better. Her right lower extremity has stable, chronic, mild swelling. Her left leg is now about the same size as the right leg in the skin wounds have healed. She has no fever or chills.  Current Outpatient Prescriptions  Medication Sig Dispense Refill  . aspirin EC 81 MG tablet Take 81 mg by mouth daily.    . celecoxib (CELEBREX) 200 MG capsule Take 200 mg by mouth daily.    . clopidogrel (PLAVIX) 75 MG tablet Take 1 tablet (75 mg total) by mouth daily. (Patient not taking: Reported on 07/22/2016) 30 tablet 11  . cyclobenzaprine (FLEXERIL) 10 MG tablet Take 10 mg by mouth 3 (three) times daily as needed. For muscle pain    . glipiZIDE (GLUCOTROL XL) 5 MG 24 hr tablet Take 5 mg by mouth 2 (two) times daily.    . Liraglutide (VICTOZA) 18 MG/3ML SOPN Inject into the skin.    Marland Kitchen lisinopril-hydrochlorothiazide (PRINZIDE,ZESTORETIC) 20-25 MG per tablet Take 1 tablet by mouth daily.    . metFORMIN (GLUCOPHAGE) 1000 MG tablet Take 1,000 mg by mouth 2 (two) times daily with a meal.    . metoprolol succinate (TOPROL-XL) 25 MG 24 hr tablet Take 25 mg by mouth daily.    . naproxen (NAPROSYN) 375 MG tablet Take 375 mg by mouth 3 (three) times daily with meals.    Marland Kitchen omeprazole (PRILOSEC) 20 MG capsule Take 20 mg by mouth daily.    . predniSONE (DELTASONE) 10 MG tablet Take 10-40 mg by mouth daily. Reported on 03/11/2015    . pregabalin (LYRICA) 25 MG capsule Take 25 mg by mouth 2 (two) times daily.    . rosuvastatin (CRESTOR) 5 MG tablet Take 5 mg by mouth daily.    . saxagliptin HCl (ONGLYZA) 5 MG TABS tablet Take 5 mg by mouth daily.     No current facility-administered  medications for this visit.     Past Medical History:  Diagnosis Date  . Anemia    as young child  . Arthritis    back, leg and foot  . Blood transfusion    1964  . Carpal tunnel syndrome   . Diabetes mellitus    oral medications  . Hypertension     Past Surgical History:  Procedure Laterality Date  . ABDOMINAL HYSTERECTOMY    . ANTERIOR CERVICAL DECOMP/DISCECTOMY FUSION  04/27/2011   Procedure: ANTERIOR CERVICAL DECOMPRESSION/DISCECTOMY FUSION 2 LEVELS;  Surgeon: Elaina Hoops, MD;  Location: [place] [facility];  Service: Neurosurgery;  Laterality: N/A;  Anterior Cervical Decompression/ Discectomy Fusion Cervical Three-Four, Cervical Four-Five  . APPENDECTOMY    . KNEE ARTHROPLASTY     right  . PERIPHERAL VASCULAR CATHETERIZATION Left 10/29/2014   Procedure: Lower Extremity Angiography;  Surgeon: Algernon Huxley, MD;  Location: [place] CV LAB;  Service: Cardiovascular;  Laterality: Left;  . PERIPHERAL VASCULAR CATHETERIZATION Left 10/29/2014   Procedure: Lower Extremity Intervention;  Surgeon: Algernon Huxley, MD;  Location: [place] CV LAB;  Service: Cardiovascular;  Laterality: Left;  . PERIPHERAL VASCULAR CATHETERIZATION Left 03/11/2015   Procedure: Lower Extremity Angiography;  Surgeon: Algernon Huxley,  MD;  Location: [place] CV [facility];  Service: Cardiovascular;  Laterality: Left;  . PERIPHERAL VASCULAR CATHETERIZATION Left 03/11/2015   Procedure: Lower Extremity Intervention;  Surgeon: Algernon Huxley, MD;  Location: [place] CV LAB;  Service: Cardiovascular;  Laterality: Left;    Social History Social History  Substance Use Topics  . Smoking status: Never Smoker  . Smokeless tobacco: Never Used  . Alcohol use No     Family History No bleeding or clotting disorders  Allergies  Allergen Reactions  . Penicillins Shortness Of Breath and Swelling  . Codeine Other (See Comments)    Ringing in ear     REVIEW OF SYSTEMS (Negative unless checked)  Constitutional: '[]' Weight  loss  '[]' Fever  '[]' Chills Cardiac: '[]' Chest pain   '[]' Chest pressure   '[]' Palpitations   '[]' Shortness of breath when laying flat   '[]' Shortness of breath at rest   '[]' Shortness of breath with exertion. Vascular:  '[]' Pain in legs with walking   '[]' Pain in legs at rest   '[]' Pain in legs when laying flat   '[]' Claudication   '[]' Pain in feet when walking  '[]' Pain in feet at rest  '[]' Pain in feet when laying flat   '[]' History of DVT   '[]' Phlebitis   '[x]' Swelling in legs   '[]' Varicose veins   '[]' Non-healing ulcers Pulmonary:   '[]' Uses home oxygen   '[]' Productive cough   '[]' Hemoptysis   '[]' Wheeze  '[]' COPD   '[]' Asthma Neurologic:  '[]' Dizziness  '[]' Blackouts   '[]' Seizures   '[]' History of stroke   '[]' History of TIA  '[]' Aphasia   '[]' Temporary blindness   '[]' Dysphagia   '[]' Weakness or numbness in arms   '[]' Weakness or numbness in legs Musculoskeletal:  '[x]' Arthritis   '[]' Joint swelling   '[]' Joint pain   '[]' Low back pain Hematologic:  '[]' Easy bruising  '[]' Easy bleeding   '[]' Hypercoagulable state   '[]' Anemic   Gastrointestinal:  '[]' Blood in stool   '[]' Vomiting blood  '[]' Gastroesophageal reflux/heartburn   '[]' Abdominal pain Genitourinary:  '[x]' Chronic kidney disease   '[]' Difficult urination  '[]' Frequent urination  '[]' Burning with urination   '[]' Hematuria Skin:  '[]' Rashes   '[x]' Ulcers   '[x]' Wounds Psychological:  '[]' History of anxiety   '[]'  History of major depression.  Physical Examination  BP 132/64 (BP Location: Right Arm)   Pulse 81   Resp 16   Ht '5\' 7"'  (1.702 m)   Wt 300 lb (136.1 kg)   BMI 46.99 kg/m  Gen:  WD/WN, NAD Head: [place]/AT, No temporalis wasting. Ear/Nose/Throat: Hearing grossly intact, nares w/o erythema or drainage, trachea midline Eyes: Conjunctiva clear. Sclera non-icteric Neck: Supple.  No JVD.  Pulmonary:  Good air movement, no use of accessory muscles.  Cardiac: RRR, normal S1, S2 Vascular:  Vessel Right Left  Radial Palpable Palpable                                   Musculoskeletal: M/S 5/5 throughout.  No deformity or atrophy.  1+ bilateral lower extremity edema. Neurologic: Sensation grossly intact in extremities.  Symmetrical.  Speech is fluent.  Psychiatric: Judgment intact, Mood & affect appropriate for pt's clinical situation. Dermatologic: Previous left leg and calf ulcerations have all healed.       Labs Recent Results (from the past 2160 hour(s))  Lipase, blood     Status: Abnormal   Collection Time: 06/09/16  6:32 PM  Result Value Ref Range   Lipase 89 (H) 11 - 51 U/L  Comprehensive metabolic  panel     Status: Abnormal   Collection Time: 06/09/16  6:32 PM  Result Value Ref Range   Sodium 139 135 - 145 mmol/L   Potassium 3.4 (L) 3.5 - 5.1 mmol/L   Chloride 100 (L) 101 - 111 mmol/L   CO2 29 22 - 32 mmol/L   Glucose, Bld 295 (H) 65 - 99 mg/dL   BUN 27 (H) 6 - 20 mg/dL   Creatinine, Ser 1.65 (H) 0.44 - 1.00 mg/dL   Calcium 8.3 (L) 8.9 - 10.3 mg/dL   Total Protein 6.8 6.5 - 8.1 g/dL   Albumin 3.4 (L) 3.5 - 5.0 g/dL   AST 41 15 - 41 U/L   ALT 28 14 - 54 U/L   Alkaline Phosphatase 73 38 - 126 U/L   Total Bilirubin 0.4 0.3 - 1.2 mg/dL   GFR calc non Af Amer 30 (L) >60 mL/min   GFR calc Af Amer 35 (L) >60 mL/min    Comment: (NOTE) The eGFR has been calculated using the CKD EPI equation. This calculation has not been validated in all clinical situations. eGFR's persistently <60 mL/min signify possible Chronic Kidney Disease.    Anion gap 10 5 - 15  CBC     Status: Abnormal   Collection Time: 06/09/16  6:32 PM  Result Value Ref Range   WBC 2.3 (L) 3.6 - 11.0 K/uL   RBC 3.97 3.80 - 5.20 MIL/uL   Hemoglobin 11.8 (L) 12.0 - 16.0 g/dL   HCT 35.9 35.0 - 47.0 %   MCV 90.4 80.0 - 100.0 fL   MCH 29.7 26.0 - 34.0 pg   MCHC 32.9 32.0 - 36.0 g/dL   RDW 13.4 11.5 - 14.5 %   Platelets 151 150 - 440 K/uL    Radiology No results found.    Assessment/Plan  PAD (peripheral artery disease) ([place]) Recently checked and stable.  Essential hypertension, benign blood pressure control important in  reducing the progression of atherosclerotic disease. On appropriate oral medications.   Bilateral lower extremity edema Much improved with several weeks of Unna boots. We will switch to compression stockings and come out of the Unna boots now. I'll see her back in about a month to recheck.    Leotis Pain, MD  08/25/2016 1:08 PM    This note was created with Dragon medical transcription system.  Any errors from dictation are purely unintentional

## 2016-08-25 NOTE — Assessment & Plan Note (Signed)
Much improved with several weeks of Unna boots. We will switch to compression stockings and come out of the Unna boots now. I'll see her back in about a month to recheck.

## 2016-08-25 NOTE — Assessment & Plan Note (Signed)
blood pressure control important in reducing the progression of atherosclerotic disease. On appropriate oral medications.  

## 2016-08-31 DIAGNOSIS — I129 Hypertensive chronic kidney disease with stage 1 through stage 4 chronic kidney disease, or unspecified chronic kidney disease: Secondary | ICD-10-CM | POA: Diagnosis not present

## 2016-08-31 DIAGNOSIS — I890 Lymphedema, not elsewhere classified: Secondary | ICD-10-CM | POA: Diagnosis not present

## 2016-08-31 DIAGNOSIS — M1991 Primary osteoarthritis, unspecified site: Secondary | ICD-10-CM | POA: Diagnosis not present

## 2016-08-31 DIAGNOSIS — Z9181 History of falling: Secondary | ICD-10-CM | POA: Diagnosis not present

## 2016-08-31 DIAGNOSIS — E1142 Type 2 diabetes mellitus with diabetic polyneuropathy: Secondary | ICD-10-CM | POA: Diagnosis not present

## 2016-08-31 DIAGNOSIS — Z7902 Long term (current) use of antithrombotics/antiplatelets: Secondary | ICD-10-CM | POA: Diagnosis not present

## 2016-08-31 DIAGNOSIS — Z794 Long term (current) use of insulin: Secondary | ICD-10-CM | POA: Diagnosis not present

## 2016-08-31 DIAGNOSIS — E1122 Type 2 diabetes mellitus with diabetic chronic kidney disease: Secondary | ICD-10-CM | POA: Diagnosis not present

## 2016-08-31 DIAGNOSIS — E1151 Type 2 diabetes mellitus with diabetic peripheral angiopathy without gangrene: Secondary | ICD-10-CM | POA: Diagnosis not present

## 2016-08-31 DIAGNOSIS — N189 Chronic kidney disease, unspecified: Secondary | ICD-10-CM | POA: Diagnosis not present

## 2016-09-07 DIAGNOSIS — E1151 Type 2 diabetes mellitus with diabetic peripheral angiopathy without gangrene: Secondary | ICD-10-CM | POA: Diagnosis not present

## 2016-09-07 DIAGNOSIS — I129 Hypertensive chronic kidney disease with stage 1 through stage 4 chronic kidney disease, or unspecified chronic kidney disease: Secondary | ICD-10-CM | POA: Diagnosis not present

## 2016-09-07 DIAGNOSIS — N189 Chronic kidney disease, unspecified: Secondary | ICD-10-CM | POA: Diagnosis not present

## 2016-09-07 DIAGNOSIS — Z794 Long term (current) use of insulin: Secondary | ICD-10-CM | POA: Diagnosis not present

## 2016-09-07 DIAGNOSIS — Z7902 Long term (current) use of antithrombotics/antiplatelets: Secondary | ICD-10-CM | POA: Diagnosis not present

## 2016-09-07 DIAGNOSIS — Z9181 History of falling: Secondary | ICD-10-CM | POA: Diagnosis not present

## 2016-09-07 DIAGNOSIS — M1991 Primary osteoarthritis, unspecified site: Secondary | ICD-10-CM | POA: Diagnosis not present

## 2016-09-07 DIAGNOSIS — E1142 Type 2 diabetes mellitus with diabetic polyneuropathy: Secondary | ICD-10-CM | POA: Diagnosis not present

## 2016-09-07 DIAGNOSIS — I890 Lymphedema, not elsewhere classified: Secondary | ICD-10-CM | POA: Diagnosis not present

## 2016-09-07 DIAGNOSIS — E1122 Type 2 diabetes mellitus with diabetic chronic kidney disease: Secondary | ICD-10-CM | POA: Diagnosis not present

## 2016-09-14 DIAGNOSIS — E1151 Type 2 diabetes mellitus with diabetic peripheral angiopathy without gangrene: Secondary | ICD-10-CM | POA: Diagnosis not present

## 2016-09-14 DIAGNOSIS — E1122 Type 2 diabetes mellitus with diabetic chronic kidney disease: Secondary | ICD-10-CM | POA: Diagnosis not present

## 2016-09-14 DIAGNOSIS — M1991 Primary osteoarthritis, unspecified site: Secondary | ICD-10-CM | POA: Diagnosis not present

## 2016-09-14 DIAGNOSIS — E1142 Type 2 diabetes mellitus with diabetic polyneuropathy: Secondary | ICD-10-CM | POA: Diagnosis not present

## 2016-09-14 DIAGNOSIS — N189 Chronic kidney disease, unspecified: Secondary | ICD-10-CM | POA: Diagnosis not present

## 2016-09-14 DIAGNOSIS — Z794 Long term (current) use of insulin: Secondary | ICD-10-CM | POA: Diagnosis not present

## 2016-09-14 DIAGNOSIS — I129 Hypertensive chronic kidney disease with stage 1 through stage 4 chronic kidney disease, or unspecified chronic kidney disease: Secondary | ICD-10-CM | POA: Diagnosis not present

## 2016-09-14 DIAGNOSIS — Z9181 History of falling: Secondary | ICD-10-CM | POA: Diagnosis not present

## 2016-09-14 DIAGNOSIS — Z7902 Long term (current) use of antithrombotics/antiplatelets: Secondary | ICD-10-CM | POA: Diagnosis not present

## 2016-09-14 DIAGNOSIS — I890 Lymphedema, not elsewhere classified: Secondary | ICD-10-CM | POA: Diagnosis not present

## 2016-09-21 DIAGNOSIS — Z7902 Long term (current) use of antithrombotics/antiplatelets: Secondary | ICD-10-CM | POA: Diagnosis not present

## 2016-09-21 DIAGNOSIS — I129 Hypertensive chronic kidney disease with stage 1 through stage 4 chronic kidney disease, or unspecified chronic kidney disease: Secondary | ICD-10-CM | POA: Diagnosis not present

## 2016-09-21 DIAGNOSIS — M1991 Primary osteoarthritis, unspecified site: Secondary | ICD-10-CM | POA: Diagnosis not present

## 2016-09-21 DIAGNOSIS — Z9181 History of falling: Secondary | ICD-10-CM | POA: Diagnosis not present

## 2016-09-21 DIAGNOSIS — E1122 Type 2 diabetes mellitus with diabetic chronic kidney disease: Secondary | ICD-10-CM | POA: Diagnosis not present

## 2016-09-21 DIAGNOSIS — E1142 Type 2 diabetes mellitus with diabetic polyneuropathy: Secondary | ICD-10-CM | POA: Diagnosis not present

## 2016-09-21 DIAGNOSIS — Z794 Long term (current) use of insulin: Secondary | ICD-10-CM | POA: Diagnosis not present

## 2016-09-21 DIAGNOSIS — E1151 Type 2 diabetes mellitus with diabetic peripheral angiopathy without gangrene: Secondary | ICD-10-CM | POA: Diagnosis not present

## 2016-09-21 DIAGNOSIS — I890 Lymphedema, not elsewhere classified: Secondary | ICD-10-CM | POA: Diagnosis not present

## 2016-09-21 DIAGNOSIS — N189 Chronic kidney disease, unspecified: Secondary | ICD-10-CM | POA: Diagnosis not present

## 2016-09-24 DIAGNOSIS — Z7902 Long term (current) use of antithrombotics/antiplatelets: Secondary | ICD-10-CM | POA: Diagnosis not present

## 2016-09-24 DIAGNOSIS — E1122 Type 2 diabetes mellitus with diabetic chronic kidney disease: Secondary | ICD-10-CM | POA: Diagnosis not present

## 2016-09-24 DIAGNOSIS — I129 Hypertensive chronic kidney disease with stage 1 through stage 4 chronic kidney disease, or unspecified chronic kidney disease: Secondary | ICD-10-CM | POA: Diagnosis not present

## 2016-09-24 DIAGNOSIS — I890 Lymphedema, not elsewhere classified: Secondary | ICD-10-CM | POA: Diagnosis not present

## 2016-09-24 DIAGNOSIS — M1991 Primary osteoarthritis, unspecified site: Secondary | ICD-10-CM | POA: Diagnosis not present

## 2016-09-24 DIAGNOSIS — E1151 Type 2 diabetes mellitus with diabetic peripheral angiopathy without gangrene: Secondary | ICD-10-CM | POA: Diagnosis not present

## 2016-09-24 DIAGNOSIS — Z9181 History of falling: Secondary | ICD-10-CM | POA: Diagnosis not present

## 2016-09-24 DIAGNOSIS — N189 Chronic kidney disease, unspecified: Secondary | ICD-10-CM | POA: Diagnosis not present

## 2016-09-24 DIAGNOSIS — E1142 Type 2 diabetes mellitus with diabetic polyneuropathy: Secondary | ICD-10-CM | POA: Diagnosis not present

## 2016-09-24 DIAGNOSIS — Z794 Long term (current) use of insulin: Secondary | ICD-10-CM | POA: Diagnosis not present

## 2016-09-28 ENCOUNTER — Telehealth (INDEPENDENT_AMBULATORY_CARE_PROVIDER_SITE_OTHER): Payer: Self-pay

## 2016-09-28 DIAGNOSIS — N189 Chronic kidney disease, unspecified: Secondary | ICD-10-CM | POA: Diagnosis not present

## 2016-09-28 DIAGNOSIS — I129 Hypertensive chronic kidney disease with stage 1 through stage 4 chronic kidney disease, or unspecified chronic kidney disease: Secondary | ICD-10-CM | POA: Diagnosis not present

## 2016-09-28 DIAGNOSIS — Z794 Long term (current) use of insulin: Secondary | ICD-10-CM | POA: Diagnosis not present

## 2016-09-28 DIAGNOSIS — Z7902 Long term (current) use of antithrombotics/antiplatelets: Secondary | ICD-10-CM | POA: Diagnosis not present

## 2016-09-28 DIAGNOSIS — E1151 Type 2 diabetes mellitus with diabetic peripheral angiopathy without gangrene: Secondary | ICD-10-CM | POA: Diagnosis not present

## 2016-09-28 DIAGNOSIS — E1122 Type 2 diabetes mellitus with diabetic chronic kidney disease: Secondary | ICD-10-CM | POA: Diagnosis not present

## 2016-09-28 DIAGNOSIS — M1991 Primary osteoarthritis, unspecified site: Secondary | ICD-10-CM | POA: Diagnosis not present

## 2016-09-28 DIAGNOSIS — Z9181 History of falling: Secondary | ICD-10-CM | POA: Diagnosis not present

## 2016-09-28 DIAGNOSIS — I890 Lymphedema, not elsewhere classified: Secondary | ICD-10-CM | POA: Diagnosis not present

## 2016-09-28 DIAGNOSIS — E1142 Type 2 diabetes mellitus with diabetic polyneuropathy: Secondary | ICD-10-CM | POA: Diagnosis not present

## 2016-09-29 ENCOUNTER — Ambulatory Visit (INDEPENDENT_AMBULATORY_CARE_PROVIDER_SITE_OTHER): Payer: Medicare Other | Admitting: Vascular Surgery

## 2016-10-01 DIAGNOSIS — Z9181 History of falling: Secondary | ICD-10-CM | POA: Diagnosis not present

## 2016-10-01 DIAGNOSIS — M1991 Primary osteoarthritis, unspecified site: Secondary | ICD-10-CM | POA: Diagnosis not present

## 2016-10-01 DIAGNOSIS — E1151 Type 2 diabetes mellitus with diabetic peripheral angiopathy without gangrene: Secondary | ICD-10-CM | POA: Diagnosis not present

## 2016-10-01 DIAGNOSIS — E1122 Type 2 diabetes mellitus with diabetic chronic kidney disease: Secondary | ICD-10-CM | POA: Diagnosis not present

## 2016-10-01 DIAGNOSIS — I129 Hypertensive chronic kidney disease with stage 1 through stage 4 chronic kidney disease, or unspecified chronic kidney disease: Secondary | ICD-10-CM | POA: Diagnosis not present

## 2016-10-01 DIAGNOSIS — Z794 Long term (current) use of insulin: Secondary | ICD-10-CM | POA: Diagnosis not present

## 2016-10-01 DIAGNOSIS — N189 Chronic kidney disease, unspecified: Secondary | ICD-10-CM | POA: Diagnosis not present

## 2016-10-01 DIAGNOSIS — I890 Lymphedema, not elsewhere classified: Secondary | ICD-10-CM | POA: Diagnosis not present

## 2016-10-01 DIAGNOSIS — E1142 Type 2 diabetes mellitus with diabetic polyneuropathy: Secondary | ICD-10-CM | POA: Diagnosis not present

## 2016-10-01 DIAGNOSIS — Z7902 Long term (current) use of antithrombotics/antiplatelets: Secondary | ICD-10-CM | POA: Diagnosis not present

## 2016-10-05 DIAGNOSIS — E1142 Type 2 diabetes mellitus with diabetic polyneuropathy: Secondary | ICD-10-CM | POA: Diagnosis not present

## 2016-10-05 DIAGNOSIS — Z7902 Long term (current) use of antithrombotics/antiplatelets: Secondary | ICD-10-CM | POA: Diagnosis not present

## 2016-10-05 DIAGNOSIS — E1122 Type 2 diabetes mellitus with diabetic chronic kidney disease: Secondary | ICD-10-CM | POA: Diagnosis not present

## 2016-10-05 DIAGNOSIS — I129 Hypertensive chronic kidney disease with stage 1 through stage 4 chronic kidney disease, or unspecified chronic kidney disease: Secondary | ICD-10-CM | POA: Diagnosis not present

## 2016-10-05 DIAGNOSIS — Z9181 History of falling: Secondary | ICD-10-CM | POA: Diagnosis not present

## 2016-10-05 DIAGNOSIS — I890 Lymphedema, not elsewhere classified: Secondary | ICD-10-CM | POA: Diagnosis not present

## 2016-10-05 DIAGNOSIS — N189 Chronic kidney disease, unspecified: Secondary | ICD-10-CM | POA: Diagnosis not present

## 2016-10-05 DIAGNOSIS — Z794 Long term (current) use of insulin: Secondary | ICD-10-CM | POA: Diagnosis not present

## 2016-10-05 DIAGNOSIS — M1991 Primary osteoarthritis, unspecified site: Secondary | ICD-10-CM | POA: Diagnosis not present

## 2016-10-05 DIAGNOSIS — E1151 Type 2 diabetes mellitus with diabetic peripheral angiopathy without gangrene: Secondary | ICD-10-CM | POA: Diagnosis not present

## 2016-10-08 DIAGNOSIS — Z794 Long term (current) use of insulin: Secondary | ICD-10-CM | POA: Diagnosis not present

## 2016-10-08 DIAGNOSIS — I890 Lymphedema, not elsewhere classified: Secondary | ICD-10-CM | POA: Diagnosis not present

## 2016-10-08 DIAGNOSIS — I129 Hypertensive chronic kidney disease with stage 1 through stage 4 chronic kidney disease, or unspecified chronic kidney disease: Secondary | ICD-10-CM | POA: Diagnosis not present

## 2016-10-08 DIAGNOSIS — E1151 Type 2 diabetes mellitus with diabetic peripheral angiopathy without gangrene: Secondary | ICD-10-CM | POA: Diagnosis not present

## 2016-10-08 DIAGNOSIS — E1122 Type 2 diabetes mellitus with diabetic chronic kidney disease: Secondary | ICD-10-CM | POA: Diagnosis not present

## 2016-10-08 DIAGNOSIS — M1991 Primary osteoarthritis, unspecified site: Secondary | ICD-10-CM | POA: Diagnosis not present

## 2016-10-08 DIAGNOSIS — Z9181 History of falling: Secondary | ICD-10-CM | POA: Diagnosis not present

## 2016-10-08 DIAGNOSIS — Z7902 Long term (current) use of antithrombotics/antiplatelets: Secondary | ICD-10-CM | POA: Diagnosis not present

## 2016-10-08 DIAGNOSIS — N189 Chronic kidney disease, unspecified: Secondary | ICD-10-CM | POA: Diagnosis not present

## 2016-10-08 DIAGNOSIS — E1142 Type 2 diabetes mellitus with diabetic polyneuropathy: Secondary | ICD-10-CM | POA: Diagnosis not present

## 2016-10-12 DIAGNOSIS — E1142 Type 2 diabetes mellitus with diabetic polyneuropathy: Secondary | ICD-10-CM | POA: Diagnosis not present

## 2016-10-12 DIAGNOSIS — I129 Hypertensive chronic kidney disease with stage 1 through stage 4 chronic kidney disease, or unspecified chronic kidney disease: Secondary | ICD-10-CM | POA: Diagnosis not present

## 2016-10-12 DIAGNOSIS — Z794 Long term (current) use of insulin: Secondary | ICD-10-CM | POA: Diagnosis not present

## 2016-10-12 DIAGNOSIS — Z9181 History of falling: Secondary | ICD-10-CM | POA: Diagnosis not present

## 2016-10-12 DIAGNOSIS — E1122 Type 2 diabetes mellitus with diabetic chronic kidney disease: Secondary | ICD-10-CM | POA: Diagnosis not present

## 2016-10-12 DIAGNOSIS — Z7902 Long term (current) use of antithrombotics/antiplatelets: Secondary | ICD-10-CM | POA: Diagnosis not present

## 2016-10-12 DIAGNOSIS — I890 Lymphedema, not elsewhere classified: Secondary | ICD-10-CM | POA: Diagnosis not present

## 2016-10-12 DIAGNOSIS — N189 Chronic kidney disease, unspecified: Secondary | ICD-10-CM | POA: Diagnosis not present

## 2016-10-12 DIAGNOSIS — M1991 Primary osteoarthritis, unspecified site: Secondary | ICD-10-CM | POA: Diagnosis not present

## 2016-10-12 DIAGNOSIS — E1151 Type 2 diabetes mellitus with diabetic peripheral angiopathy without gangrene: Secondary | ICD-10-CM | POA: Diagnosis not present

## 2016-10-16 DIAGNOSIS — I129 Hypertensive chronic kidney disease with stage 1 through stage 4 chronic kidney disease, or unspecified chronic kidney disease: Secondary | ICD-10-CM | POA: Diagnosis not present

## 2016-10-16 DIAGNOSIS — E1151 Type 2 diabetes mellitus with diabetic peripheral angiopathy without gangrene: Secondary | ICD-10-CM | POA: Diagnosis not present

## 2016-10-16 DIAGNOSIS — Z794 Long term (current) use of insulin: Secondary | ICD-10-CM | POA: Diagnosis not present

## 2016-10-16 DIAGNOSIS — E1142 Type 2 diabetes mellitus with diabetic polyneuropathy: Secondary | ICD-10-CM | POA: Diagnosis not present

## 2016-10-16 DIAGNOSIS — Z7902 Long term (current) use of antithrombotics/antiplatelets: Secondary | ICD-10-CM | POA: Diagnosis not present

## 2016-10-16 DIAGNOSIS — M1991 Primary osteoarthritis, unspecified site: Secondary | ICD-10-CM | POA: Diagnosis not present

## 2016-10-16 DIAGNOSIS — E1122 Type 2 diabetes mellitus with diabetic chronic kidney disease: Secondary | ICD-10-CM | POA: Diagnosis not present

## 2016-10-16 DIAGNOSIS — N189 Chronic kidney disease, unspecified: Secondary | ICD-10-CM | POA: Diagnosis not present

## 2016-10-16 DIAGNOSIS — Z9181 History of falling: Secondary | ICD-10-CM | POA: Diagnosis not present

## 2016-10-16 DIAGNOSIS — I890 Lymphedema, not elsewhere classified: Secondary | ICD-10-CM | POA: Diagnosis not present

## 2016-10-20 DIAGNOSIS — N189 Chronic kidney disease, unspecified: Secondary | ICD-10-CM | POA: Diagnosis not present

## 2016-10-20 DIAGNOSIS — Z794 Long term (current) use of insulin: Secondary | ICD-10-CM | POA: Diagnosis not present

## 2016-10-20 DIAGNOSIS — Z9181 History of falling: Secondary | ICD-10-CM | POA: Diagnosis not present

## 2016-10-20 DIAGNOSIS — E1151 Type 2 diabetes mellitus with diabetic peripheral angiopathy without gangrene: Secondary | ICD-10-CM | POA: Diagnosis not present

## 2016-10-20 DIAGNOSIS — M1991 Primary osteoarthritis, unspecified site: Secondary | ICD-10-CM | POA: Diagnosis not present

## 2016-10-20 DIAGNOSIS — E1122 Type 2 diabetes mellitus with diabetic chronic kidney disease: Secondary | ICD-10-CM | POA: Diagnosis not present

## 2016-10-20 DIAGNOSIS — I890 Lymphedema, not elsewhere classified: Secondary | ICD-10-CM | POA: Diagnosis not present

## 2016-10-20 DIAGNOSIS — I129 Hypertensive chronic kidney disease with stage 1 through stage 4 chronic kidney disease, or unspecified chronic kidney disease: Secondary | ICD-10-CM | POA: Diagnosis not present

## 2016-10-20 DIAGNOSIS — Z7902 Long term (current) use of antithrombotics/antiplatelets: Secondary | ICD-10-CM | POA: Diagnosis not present

## 2016-10-20 DIAGNOSIS — E1142 Type 2 diabetes mellitus with diabetic polyneuropathy: Secondary | ICD-10-CM | POA: Diagnosis not present

## 2016-10-23 DIAGNOSIS — Z7902 Long term (current) use of antithrombotics/antiplatelets: Secondary | ICD-10-CM | POA: Diagnosis not present

## 2016-10-23 DIAGNOSIS — E1151 Type 2 diabetes mellitus with diabetic peripheral angiopathy without gangrene: Secondary | ICD-10-CM | POA: Diagnosis not present

## 2016-10-23 DIAGNOSIS — M1991 Primary osteoarthritis, unspecified site: Secondary | ICD-10-CM | POA: Diagnosis not present

## 2016-10-23 DIAGNOSIS — Z794 Long term (current) use of insulin: Secondary | ICD-10-CM | POA: Diagnosis not present

## 2016-10-23 DIAGNOSIS — N189 Chronic kidney disease, unspecified: Secondary | ICD-10-CM | POA: Diagnosis not present

## 2016-10-23 DIAGNOSIS — I129 Hypertensive chronic kidney disease with stage 1 through stage 4 chronic kidney disease, or unspecified chronic kidney disease: Secondary | ICD-10-CM | POA: Diagnosis not present

## 2016-10-23 DIAGNOSIS — E1142 Type 2 diabetes mellitus with diabetic polyneuropathy: Secondary | ICD-10-CM | POA: Diagnosis not present

## 2016-10-23 DIAGNOSIS — E1122 Type 2 diabetes mellitus with diabetic chronic kidney disease: Secondary | ICD-10-CM | POA: Diagnosis not present

## 2016-10-23 DIAGNOSIS — Z9181 History of falling: Secondary | ICD-10-CM | POA: Diagnosis not present

## 2016-10-23 DIAGNOSIS — I890 Lymphedema, not elsewhere classified: Secondary | ICD-10-CM | POA: Diagnosis not present

## 2016-10-26 DIAGNOSIS — E1151 Type 2 diabetes mellitus with diabetic peripheral angiopathy without gangrene: Secondary | ICD-10-CM | POA: Diagnosis not present

## 2016-10-26 DIAGNOSIS — E1142 Type 2 diabetes mellitus with diabetic polyneuropathy: Secondary | ICD-10-CM | POA: Diagnosis not present

## 2016-10-26 DIAGNOSIS — Z9181 History of falling: Secondary | ICD-10-CM | POA: Diagnosis not present

## 2016-10-26 DIAGNOSIS — I890 Lymphedema, not elsewhere classified: Secondary | ICD-10-CM | POA: Diagnosis not present

## 2016-10-26 DIAGNOSIS — E1122 Type 2 diabetes mellitus with diabetic chronic kidney disease: Secondary | ICD-10-CM | POA: Diagnosis not present

## 2016-10-26 DIAGNOSIS — I129 Hypertensive chronic kidney disease with stage 1 through stage 4 chronic kidney disease, or unspecified chronic kidney disease: Secondary | ICD-10-CM | POA: Diagnosis not present

## 2016-10-26 DIAGNOSIS — Z794 Long term (current) use of insulin: Secondary | ICD-10-CM | POA: Diagnosis not present

## 2016-10-26 DIAGNOSIS — Z7902 Long term (current) use of antithrombotics/antiplatelets: Secondary | ICD-10-CM | POA: Diagnosis not present

## 2016-10-26 DIAGNOSIS — M1991 Primary osteoarthritis, unspecified site: Secondary | ICD-10-CM | POA: Diagnosis not present

## 2016-10-26 DIAGNOSIS — N189 Chronic kidney disease, unspecified: Secondary | ICD-10-CM | POA: Diagnosis not present

## 2016-10-29 DIAGNOSIS — N189 Chronic kidney disease, unspecified: Secondary | ICD-10-CM | POA: Diagnosis not present

## 2016-10-29 DIAGNOSIS — M1991 Primary osteoarthritis, unspecified site: Secondary | ICD-10-CM | POA: Diagnosis not present

## 2016-10-29 DIAGNOSIS — I129 Hypertensive chronic kidney disease with stage 1 through stage 4 chronic kidney disease, or unspecified chronic kidney disease: Secondary | ICD-10-CM | POA: Diagnosis not present

## 2016-10-29 DIAGNOSIS — Z794 Long term (current) use of insulin: Secondary | ICD-10-CM | POA: Diagnosis not present

## 2016-10-29 DIAGNOSIS — Z7902 Long term (current) use of antithrombotics/antiplatelets: Secondary | ICD-10-CM | POA: Diagnosis not present

## 2016-10-29 DIAGNOSIS — Z9181 History of falling: Secondary | ICD-10-CM | POA: Diagnosis not present

## 2016-10-29 DIAGNOSIS — I890 Lymphedema, not elsewhere classified: Secondary | ICD-10-CM | POA: Diagnosis not present

## 2016-10-29 DIAGNOSIS — E1122 Type 2 diabetes mellitus with diabetic chronic kidney disease: Secondary | ICD-10-CM | POA: Diagnosis not present

## 2016-10-29 DIAGNOSIS — E1142 Type 2 diabetes mellitus with diabetic polyneuropathy: Secondary | ICD-10-CM | POA: Diagnosis not present

## 2016-10-29 DIAGNOSIS — E1151 Type 2 diabetes mellitus with diabetic peripheral angiopathy without gangrene: Secondary | ICD-10-CM | POA: Diagnosis not present

## 2016-11-02 DIAGNOSIS — E1122 Type 2 diabetes mellitus with diabetic chronic kidney disease: Secondary | ICD-10-CM | POA: Diagnosis not present

## 2016-11-02 DIAGNOSIS — Z7902 Long term (current) use of antithrombotics/antiplatelets: Secondary | ICD-10-CM | POA: Diagnosis not present

## 2016-11-02 DIAGNOSIS — Z9181 History of falling: Secondary | ICD-10-CM | POA: Diagnosis not present

## 2016-11-02 DIAGNOSIS — E1142 Type 2 diabetes mellitus with diabetic polyneuropathy: Secondary | ICD-10-CM | POA: Diagnosis not present

## 2016-11-02 DIAGNOSIS — I129 Hypertensive chronic kidney disease with stage 1 through stage 4 chronic kidney disease, or unspecified chronic kidney disease: Secondary | ICD-10-CM | POA: Diagnosis not present

## 2016-11-02 DIAGNOSIS — M1991 Primary osteoarthritis, unspecified site: Secondary | ICD-10-CM | POA: Diagnosis not present

## 2016-11-02 DIAGNOSIS — Z794 Long term (current) use of insulin: Secondary | ICD-10-CM | POA: Diagnosis not present

## 2016-11-02 DIAGNOSIS — N189 Chronic kidney disease, unspecified: Secondary | ICD-10-CM | POA: Diagnosis not present

## 2016-11-02 DIAGNOSIS — I890 Lymphedema, not elsewhere classified: Secondary | ICD-10-CM | POA: Diagnosis not present

## 2016-11-02 DIAGNOSIS — E1151 Type 2 diabetes mellitus with diabetic peripheral angiopathy without gangrene: Secondary | ICD-10-CM | POA: Diagnosis not present

## 2016-11-05 DIAGNOSIS — E1142 Type 2 diabetes mellitus with diabetic polyneuropathy: Secondary | ICD-10-CM | POA: Diagnosis not present

## 2016-11-05 DIAGNOSIS — E1122 Type 2 diabetes mellitus with diabetic chronic kidney disease: Secondary | ICD-10-CM | POA: Diagnosis not present

## 2016-11-05 DIAGNOSIS — E1151 Type 2 diabetes mellitus with diabetic peripheral angiopathy without gangrene: Secondary | ICD-10-CM | POA: Diagnosis not present

## 2016-11-05 DIAGNOSIS — M1991 Primary osteoarthritis, unspecified site: Secondary | ICD-10-CM | POA: Diagnosis not present

## 2016-11-05 DIAGNOSIS — Z794 Long term (current) use of insulin: Secondary | ICD-10-CM | POA: Diagnosis not present

## 2016-11-05 DIAGNOSIS — N189 Chronic kidney disease, unspecified: Secondary | ICD-10-CM | POA: Diagnosis not present

## 2016-11-05 DIAGNOSIS — I129 Hypertensive chronic kidney disease with stage 1 through stage 4 chronic kidney disease, or unspecified chronic kidney disease: Secondary | ICD-10-CM | POA: Diagnosis not present

## 2016-11-05 DIAGNOSIS — I890 Lymphedema, not elsewhere classified: Secondary | ICD-10-CM | POA: Diagnosis not present

## 2016-11-05 DIAGNOSIS — Z7902 Long term (current) use of antithrombotics/antiplatelets: Secondary | ICD-10-CM | POA: Diagnosis not present

## 2016-11-05 DIAGNOSIS — Z9181 History of falling: Secondary | ICD-10-CM | POA: Diagnosis not present

## 2016-11-09 DIAGNOSIS — I890 Lymphedema, not elsewhere classified: Secondary | ICD-10-CM | POA: Diagnosis not present

## 2016-11-09 DIAGNOSIS — I129 Hypertensive chronic kidney disease with stage 1 through stage 4 chronic kidney disease, or unspecified chronic kidney disease: Secondary | ICD-10-CM | POA: Diagnosis not present

## 2016-11-09 DIAGNOSIS — Z7902 Long term (current) use of antithrombotics/antiplatelets: Secondary | ICD-10-CM | POA: Diagnosis not present

## 2016-11-09 DIAGNOSIS — E1122 Type 2 diabetes mellitus with diabetic chronic kidney disease: Secondary | ICD-10-CM | POA: Diagnosis not present

## 2016-11-09 DIAGNOSIS — Z794 Long term (current) use of insulin: Secondary | ICD-10-CM | POA: Diagnosis not present

## 2016-11-09 DIAGNOSIS — Z9181 History of falling: Secondary | ICD-10-CM | POA: Diagnosis not present

## 2016-11-09 DIAGNOSIS — N189 Chronic kidney disease, unspecified: Secondary | ICD-10-CM | POA: Diagnosis not present

## 2016-11-09 DIAGNOSIS — E1142 Type 2 diabetes mellitus with diabetic polyneuropathy: Secondary | ICD-10-CM | POA: Diagnosis not present

## 2016-11-09 DIAGNOSIS — E1151 Type 2 diabetes mellitus with diabetic peripheral angiopathy without gangrene: Secondary | ICD-10-CM | POA: Diagnosis not present

## 2016-11-09 DIAGNOSIS — M1991 Primary osteoarthritis, unspecified site: Secondary | ICD-10-CM | POA: Diagnosis not present

## 2016-11-12 DIAGNOSIS — I129 Hypertensive chronic kidney disease with stage 1 through stage 4 chronic kidney disease, or unspecified chronic kidney disease: Secondary | ICD-10-CM | POA: Diagnosis not present

## 2016-11-12 DIAGNOSIS — E1122 Type 2 diabetes mellitus with diabetic chronic kidney disease: Secondary | ICD-10-CM | POA: Diagnosis not present

## 2016-11-12 DIAGNOSIS — N189 Chronic kidney disease, unspecified: Secondary | ICD-10-CM | POA: Diagnosis not present

## 2016-11-12 DIAGNOSIS — Z794 Long term (current) use of insulin: Secondary | ICD-10-CM | POA: Diagnosis not present

## 2016-11-12 DIAGNOSIS — E1142 Type 2 diabetes mellitus with diabetic polyneuropathy: Secondary | ICD-10-CM | POA: Diagnosis not present

## 2016-11-12 DIAGNOSIS — M1991 Primary osteoarthritis, unspecified site: Secondary | ICD-10-CM | POA: Diagnosis not present

## 2016-11-12 DIAGNOSIS — Z7902 Long term (current) use of antithrombotics/antiplatelets: Secondary | ICD-10-CM | POA: Diagnosis not present

## 2016-11-12 DIAGNOSIS — I890 Lymphedema, not elsewhere classified: Secondary | ICD-10-CM | POA: Diagnosis not present

## 2016-11-12 DIAGNOSIS — Z9181 History of falling: Secondary | ICD-10-CM | POA: Diagnosis not present

## 2016-11-12 DIAGNOSIS — E1151 Type 2 diabetes mellitus with diabetic peripheral angiopathy without gangrene: Secondary | ICD-10-CM | POA: Diagnosis not present

## 2016-11-17 DIAGNOSIS — I129 Hypertensive chronic kidney disease with stage 1 through stage 4 chronic kidney disease, or unspecified chronic kidney disease: Secondary | ICD-10-CM | POA: Diagnosis not present

## 2016-11-17 DIAGNOSIS — E1151 Type 2 diabetes mellitus with diabetic peripheral angiopathy without gangrene: Secondary | ICD-10-CM | POA: Diagnosis not present

## 2016-11-17 DIAGNOSIS — N189 Chronic kidney disease, unspecified: Secondary | ICD-10-CM | POA: Diagnosis not present

## 2016-11-17 DIAGNOSIS — E1122 Type 2 diabetes mellitus with diabetic chronic kidney disease: Secondary | ICD-10-CM | POA: Diagnosis not present

## 2016-11-17 DIAGNOSIS — Z9181 History of falling: Secondary | ICD-10-CM | POA: Diagnosis not present

## 2016-11-17 DIAGNOSIS — E1142 Type 2 diabetes mellitus with diabetic polyneuropathy: Secondary | ICD-10-CM | POA: Diagnosis not present

## 2016-11-17 DIAGNOSIS — Z794 Long term (current) use of insulin: Secondary | ICD-10-CM | POA: Diagnosis not present

## 2016-11-17 DIAGNOSIS — Z7902 Long term (current) use of antithrombotics/antiplatelets: Secondary | ICD-10-CM | POA: Diagnosis not present

## 2016-11-17 DIAGNOSIS — M1991 Primary osteoarthritis, unspecified site: Secondary | ICD-10-CM | POA: Diagnosis not present

## 2016-11-17 DIAGNOSIS — I890 Lymphedema, not elsewhere classified: Secondary | ICD-10-CM | POA: Diagnosis not present

## 2016-11-23 DIAGNOSIS — E1142 Type 2 diabetes mellitus with diabetic polyneuropathy: Secondary | ICD-10-CM | POA: Diagnosis not present

## 2016-11-23 DIAGNOSIS — Z9181 History of falling: Secondary | ICD-10-CM | POA: Diagnosis not present

## 2016-11-23 DIAGNOSIS — Z794 Long term (current) use of insulin: Secondary | ICD-10-CM | POA: Diagnosis not present

## 2016-11-23 DIAGNOSIS — N189 Chronic kidney disease, unspecified: Secondary | ICD-10-CM | POA: Diagnosis not present

## 2016-11-23 DIAGNOSIS — E1122 Type 2 diabetes mellitus with diabetic chronic kidney disease: Secondary | ICD-10-CM | POA: Diagnosis not present

## 2016-11-23 DIAGNOSIS — Z7902 Long term (current) use of antithrombotics/antiplatelets: Secondary | ICD-10-CM | POA: Diagnosis not present

## 2016-11-23 DIAGNOSIS — I129 Hypertensive chronic kidney disease with stage 1 through stage 4 chronic kidney disease, or unspecified chronic kidney disease: Secondary | ICD-10-CM | POA: Diagnosis not present

## 2016-11-23 DIAGNOSIS — M1991 Primary osteoarthritis, unspecified site: Secondary | ICD-10-CM | POA: Diagnosis not present

## 2016-11-23 DIAGNOSIS — I890 Lymphedema, not elsewhere classified: Secondary | ICD-10-CM | POA: Diagnosis not present

## 2016-11-23 DIAGNOSIS — E1151 Type 2 diabetes mellitus with diabetic peripheral angiopathy without gangrene: Secondary | ICD-10-CM | POA: Diagnosis not present

## 2016-11-26 DIAGNOSIS — Z7902 Long term (current) use of antithrombotics/antiplatelets: Secondary | ICD-10-CM | POA: Diagnosis not present

## 2016-11-26 DIAGNOSIS — M1991 Primary osteoarthritis, unspecified site: Secondary | ICD-10-CM | POA: Diagnosis not present

## 2016-11-26 DIAGNOSIS — N189 Chronic kidney disease, unspecified: Secondary | ICD-10-CM | POA: Diagnosis not present

## 2016-11-26 DIAGNOSIS — I129 Hypertensive chronic kidney disease with stage 1 through stage 4 chronic kidney disease, or unspecified chronic kidney disease: Secondary | ICD-10-CM | POA: Diagnosis not present

## 2016-11-26 DIAGNOSIS — E1142 Type 2 diabetes mellitus with diabetic polyneuropathy: Secondary | ICD-10-CM | POA: Diagnosis not present

## 2016-11-26 DIAGNOSIS — Z9181 History of falling: Secondary | ICD-10-CM | POA: Diagnosis not present

## 2016-11-26 DIAGNOSIS — E1122 Type 2 diabetes mellitus with diabetic chronic kidney disease: Secondary | ICD-10-CM | POA: Diagnosis not present

## 2016-11-26 DIAGNOSIS — Z794 Long term (current) use of insulin: Secondary | ICD-10-CM | POA: Diagnosis not present

## 2016-11-26 DIAGNOSIS — E1151 Type 2 diabetes mellitus with diabetic peripheral angiopathy without gangrene: Secondary | ICD-10-CM | POA: Diagnosis not present

## 2016-11-26 DIAGNOSIS — I890 Lymphedema, not elsewhere classified: Secondary | ICD-10-CM | POA: Diagnosis not present

## 2016-11-30 DIAGNOSIS — I890 Lymphedema, not elsewhere classified: Secondary | ICD-10-CM | POA: Diagnosis not present

## 2016-11-30 DIAGNOSIS — E1142 Type 2 diabetes mellitus with diabetic polyneuropathy: Secondary | ICD-10-CM | POA: Diagnosis not present

## 2016-11-30 DIAGNOSIS — E1151 Type 2 diabetes mellitus with diabetic peripheral angiopathy without gangrene: Secondary | ICD-10-CM | POA: Diagnosis not present

## 2016-11-30 DIAGNOSIS — E1122 Type 2 diabetes mellitus with diabetic chronic kidney disease: Secondary | ICD-10-CM | POA: Diagnosis not present

## 2016-11-30 DIAGNOSIS — M1991 Primary osteoarthritis, unspecified site: Secondary | ICD-10-CM | POA: Diagnosis not present

## 2016-11-30 DIAGNOSIS — N189 Chronic kidney disease, unspecified: Secondary | ICD-10-CM | POA: Diagnosis not present

## 2016-11-30 DIAGNOSIS — Z9181 History of falling: Secondary | ICD-10-CM | POA: Diagnosis not present

## 2016-11-30 DIAGNOSIS — Z7902 Long term (current) use of antithrombotics/antiplatelets: Secondary | ICD-10-CM | POA: Diagnosis not present

## 2016-11-30 DIAGNOSIS — Z794 Long term (current) use of insulin: Secondary | ICD-10-CM | POA: Diagnosis not present

## 2016-11-30 DIAGNOSIS — I129 Hypertensive chronic kidney disease with stage 1 through stage 4 chronic kidney disease, or unspecified chronic kidney disease: Secondary | ICD-10-CM | POA: Diagnosis not present

## 2016-12-03 DIAGNOSIS — E1151 Type 2 diabetes mellitus with diabetic peripheral angiopathy without gangrene: Secondary | ICD-10-CM | POA: Diagnosis not present

## 2016-12-03 DIAGNOSIS — Z7902 Long term (current) use of antithrombotics/antiplatelets: Secondary | ICD-10-CM | POA: Diagnosis not present

## 2016-12-03 DIAGNOSIS — E1142 Type 2 diabetes mellitus with diabetic polyneuropathy: Secondary | ICD-10-CM | POA: Diagnosis not present

## 2016-12-03 DIAGNOSIS — N189 Chronic kidney disease, unspecified: Secondary | ICD-10-CM | POA: Diagnosis not present

## 2016-12-03 DIAGNOSIS — Z794 Long term (current) use of insulin: Secondary | ICD-10-CM | POA: Diagnosis not present

## 2016-12-03 DIAGNOSIS — E1122 Type 2 diabetes mellitus with diabetic chronic kidney disease: Secondary | ICD-10-CM | POA: Diagnosis not present

## 2016-12-03 DIAGNOSIS — M1991 Primary osteoarthritis, unspecified site: Secondary | ICD-10-CM | POA: Diagnosis not present

## 2016-12-03 DIAGNOSIS — I890 Lymphedema, not elsewhere classified: Secondary | ICD-10-CM | POA: Diagnosis not present

## 2016-12-03 DIAGNOSIS — Z9181 History of falling: Secondary | ICD-10-CM | POA: Diagnosis not present

## 2016-12-03 DIAGNOSIS — I129 Hypertensive chronic kidney disease with stage 1 through stage 4 chronic kidney disease, or unspecified chronic kidney disease: Secondary | ICD-10-CM | POA: Diagnosis not present

## 2016-12-07 DIAGNOSIS — Z794 Long term (current) use of insulin: Secondary | ICD-10-CM | POA: Diagnosis not present

## 2016-12-07 DIAGNOSIS — E1142 Type 2 diabetes mellitus with diabetic polyneuropathy: Secondary | ICD-10-CM | POA: Diagnosis not present

## 2016-12-07 DIAGNOSIS — I129 Hypertensive chronic kidney disease with stage 1 through stage 4 chronic kidney disease, or unspecified chronic kidney disease: Secondary | ICD-10-CM | POA: Diagnosis not present

## 2016-12-07 DIAGNOSIS — N189 Chronic kidney disease, unspecified: Secondary | ICD-10-CM | POA: Diagnosis not present

## 2016-12-07 DIAGNOSIS — I890 Lymphedema, not elsewhere classified: Secondary | ICD-10-CM | POA: Diagnosis not present

## 2016-12-07 DIAGNOSIS — E1122 Type 2 diabetes mellitus with diabetic chronic kidney disease: Secondary | ICD-10-CM | POA: Diagnosis not present

## 2016-12-07 DIAGNOSIS — E1151 Type 2 diabetes mellitus with diabetic peripheral angiopathy without gangrene: Secondary | ICD-10-CM | POA: Diagnosis not present

## 2016-12-07 DIAGNOSIS — Z9181 History of falling: Secondary | ICD-10-CM | POA: Diagnosis not present

## 2016-12-07 DIAGNOSIS — M1991 Primary osteoarthritis, unspecified site: Secondary | ICD-10-CM | POA: Diagnosis not present

## 2016-12-07 DIAGNOSIS — Z7902 Long term (current) use of antithrombotics/antiplatelets: Secondary | ICD-10-CM | POA: Diagnosis not present

## 2016-12-10 DIAGNOSIS — E1151 Type 2 diabetes mellitus with diabetic peripheral angiopathy without gangrene: Secondary | ICD-10-CM | POA: Diagnosis not present

## 2016-12-10 DIAGNOSIS — Z7902 Long term (current) use of antithrombotics/antiplatelets: Secondary | ICD-10-CM | POA: Diagnosis not present

## 2016-12-10 DIAGNOSIS — I890 Lymphedema, not elsewhere classified: Secondary | ICD-10-CM | POA: Diagnosis not present

## 2016-12-10 DIAGNOSIS — Z9181 History of falling: Secondary | ICD-10-CM | POA: Diagnosis not present

## 2016-12-10 DIAGNOSIS — E1142 Type 2 diabetes mellitus with diabetic polyneuropathy: Secondary | ICD-10-CM | POA: Diagnosis not present

## 2016-12-10 DIAGNOSIS — E1122 Type 2 diabetes mellitus with diabetic chronic kidney disease: Secondary | ICD-10-CM | POA: Diagnosis not present

## 2016-12-10 DIAGNOSIS — N189 Chronic kidney disease, unspecified: Secondary | ICD-10-CM | POA: Diagnosis not present

## 2016-12-10 DIAGNOSIS — M1991 Primary osteoarthritis, unspecified site: Secondary | ICD-10-CM | POA: Diagnosis not present

## 2016-12-10 DIAGNOSIS — I129 Hypertensive chronic kidney disease with stage 1 through stage 4 chronic kidney disease, or unspecified chronic kidney disease: Secondary | ICD-10-CM | POA: Diagnosis not present

## 2016-12-10 DIAGNOSIS — Z794 Long term (current) use of insulin: Secondary | ICD-10-CM | POA: Diagnosis not present

## 2016-12-14 DIAGNOSIS — Z7902 Long term (current) use of antithrombotics/antiplatelets: Secondary | ICD-10-CM | POA: Diagnosis not present

## 2016-12-14 DIAGNOSIS — M1991 Primary osteoarthritis, unspecified site: Secondary | ICD-10-CM | POA: Diagnosis not present

## 2016-12-14 DIAGNOSIS — Z794 Long term (current) use of insulin: Secondary | ICD-10-CM | POA: Diagnosis not present

## 2016-12-14 DIAGNOSIS — E1142 Type 2 diabetes mellitus with diabetic polyneuropathy: Secondary | ICD-10-CM | POA: Diagnosis not present

## 2016-12-14 DIAGNOSIS — Z9181 History of falling: Secondary | ICD-10-CM | POA: Diagnosis not present

## 2016-12-14 DIAGNOSIS — E1151 Type 2 diabetes mellitus with diabetic peripheral angiopathy without gangrene: Secondary | ICD-10-CM | POA: Diagnosis not present

## 2016-12-14 DIAGNOSIS — N189 Chronic kidney disease, unspecified: Secondary | ICD-10-CM | POA: Diagnosis not present

## 2016-12-14 DIAGNOSIS — E1122 Type 2 diabetes mellitus with diabetic chronic kidney disease: Secondary | ICD-10-CM | POA: Diagnosis not present

## 2016-12-14 DIAGNOSIS — I129 Hypertensive chronic kidney disease with stage 1 through stage 4 chronic kidney disease, or unspecified chronic kidney disease: Secondary | ICD-10-CM | POA: Diagnosis not present

## 2016-12-14 DIAGNOSIS — I890 Lymphedema, not elsewhere classified: Secondary | ICD-10-CM | POA: Diagnosis not present

## 2016-12-17 DIAGNOSIS — E1122 Type 2 diabetes mellitus with diabetic chronic kidney disease: Secondary | ICD-10-CM | POA: Diagnosis not present

## 2016-12-17 DIAGNOSIS — I129 Hypertensive chronic kidney disease with stage 1 through stage 4 chronic kidney disease, or unspecified chronic kidney disease: Secondary | ICD-10-CM | POA: Diagnosis not present

## 2016-12-17 DIAGNOSIS — E1151 Type 2 diabetes mellitus with diabetic peripheral angiopathy without gangrene: Secondary | ICD-10-CM | POA: Diagnosis not present

## 2016-12-17 DIAGNOSIS — Z794 Long term (current) use of insulin: Secondary | ICD-10-CM | POA: Diagnosis not present

## 2016-12-17 DIAGNOSIS — Z7902 Long term (current) use of antithrombotics/antiplatelets: Secondary | ICD-10-CM | POA: Diagnosis not present

## 2016-12-17 DIAGNOSIS — E1142 Type 2 diabetes mellitus with diabetic polyneuropathy: Secondary | ICD-10-CM | POA: Diagnosis not present

## 2016-12-17 DIAGNOSIS — M1991 Primary osteoarthritis, unspecified site: Secondary | ICD-10-CM | POA: Diagnosis not present

## 2016-12-17 DIAGNOSIS — Z9181 History of falling: Secondary | ICD-10-CM | POA: Diagnosis not present

## 2016-12-17 DIAGNOSIS — N189 Chronic kidney disease, unspecified: Secondary | ICD-10-CM | POA: Diagnosis not present

## 2016-12-17 DIAGNOSIS — I890 Lymphedema, not elsewhere classified: Secondary | ICD-10-CM | POA: Diagnosis not present

## 2016-12-21 DIAGNOSIS — I129 Hypertensive chronic kidney disease with stage 1 through stage 4 chronic kidney disease, or unspecified chronic kidney disease: Secondary | ICD-10-CM | POA: Diagnosis not present

## 2016-12-21 DIAGNOSIS — Z7902 Long term (current) use of antithrombotics/antiplatelets: Secondary | ICD-10-CM | POA: Diagnosis not present

## 2016-12-21 DIAGNOSIS — I890 Lymphedema, not elsewhere classified: Secondary | ICD-10-CM | POA: Diagnosis not present

## 2016-12-21 DIAGNOSIS — Z794 Long term (current) use of insulin: Secondary | ICD-10-CM | POA: Diagnosis not present

## 2016-12-21 DIAGNOSIS — E1151 Type 2 diabetes mellitus with diabetic peripheral angiopathy without gangrene: Secondary | ICD-10-CM | POA: Diagnosis not present

## 2016-12-21 DIAGNOSIS — M1991 Primary osteoarthritis, unspecified site: Secondary | ICD-10-CM | POA: Diagnosis not present

## 2016-12-21 DIAGNOSIS — E1122 Type 2 diabetes mellitus with diabetic chronic kidney disease: Secondary | ICD-10-CM | POA: Diagnosis not present

## 2016-12-21 DIAGNOSIS — N189 Chronic kidney disease, unspecified: Secondary | ICD-10-CM | POA: Diagnosis not present

## 2016-12-21 DIAGNOSIS — E1142 Type 2 diabetes mellitus with diabetic polyneuropathy: Secondary | ICD-10-CM | POA: Diagnosis not present

## 2016-12-21 DIAGNOSIS — Z9181 History of falling: Secondary | ICD-10-CM | POA: Diagnosis not present

## 2016-12-24 ENCOUNTER — Other Ambulatory Visit: Payer: Self-pay

## 2016-12-24 DIAGNOSIS — I890 Lymphedema, not elsewhere classified: Secondary | ICD-10-CM | POA: Diagnosis not present

## 2016-12-24 DIAGNOSIS — N189 Chronic kidney disease, unspecified: Secondary | ICD-10-CM | POA: Diagnosis not present

## 2016-12-24 DIAGNOSIS — E1151 Type 2 diabetes mellitus with diabetic peripheral angiopathy without gangrene: Secondary | ICD-10-CM | POA: Diagnosis not present

## 2016-12-24 DIAGNOSIS — Z7902 Long term (current) use of antithrombotics/antiplatelets: Secondary | ICD-10-CM | POA: Diagnosis not present

## 2016-12-24 DIAGNOSIS — E1122 Type 2 diabetes mellitus with diabetic chronic kidney disease: Secondary | ICD-10-CM | POA: Diagnosis not present

## 2016-12-24 DIAGNOSIS — Z794 Long term (current) use of insulin: Secondary | ICD-10-CM | POA: Diagnosis not present

## 2016-12-24 DIAGNOSIS — M1991 Primary osteoarthritis, unspecified site: Secondary | ICD-10-CM | POA: Diagnosis not present

## 2016-12-24 DIAGNOSIS — I129 Hypertensive chronic kidney disease with stage 1 through stage 4 chronic kidney disease, or unspecified chronic kidney disease: Secondary | ICD-10-CM | POA: Diagnosis not present

## 2016-12-24 DIAGNOSIS — E1142 Type 2 diabetes mellitus with diabetic polyneuropathy: Secondary | ICD-10-CM | POA: Diagnosis not present

## 2016-12-24 DIAGNOSIS — Z9181 History of falling: Secondary | ICD-10-CM | POA: Diagnosis not present

## 2016-12-24 NOTE — Patient Outreach (Signed)
[facility] Care Management  12/24/2016  Rolena InfanteMary N Gola [birth year] [id number]    Medication Adherence call to Mrs.Mellody LifeMary Hornaday patient is showing past due under [facility] [employer]on Rosuvastatin 10 mg spoke to patient she said she needs the medication and  ask if we can order the medication and have the pharmacy deliver for her,call [facility] and order her medication and ask them to deliver it, patient will get a ninety days supply.   Lillia AbedAna Ollison-Moran CPhT Pharmacy Technician [facility] Care Management Direct Dial [phone]  Fax [phone] [email or web address]

## 2016-12-28 DIAGNOSIS — M1991 Primary osteoarthritis, unspecified site: Secondary | ICD-10-CM | POA: Diagnosis not present

## 2016-12-28 DIAGNOSIS — Z9181 History of falling: Secondary | ICD-10-CM | POA: Diagnosis not present

## 2016-12-28 DIAGNOSIS — N189 Chronic kidney disease, unspecified: Secondary | ICD-10-CM | POA: Diagnosis not present

## 2016-12-28 DIAGNOSIS — I890 Lymphedema, not elsewhere classified: Secondary | ICD-10-CM | POA: Diagnosis not present

## 2016-12-28 DIAGNOSIS — I129 Hypertensive chronic kidney disease with stage 1 through stage 4 chronic kidney disease, or unspecified chronic kidney disease: Secondary | ICD-10-CM | POA: Diagnosis not present

## 2016-12-28 DIAGNOSIS — E1142 Type 2 diabetes mellitus with diabetic polyneuropathy: Secondary | ICD-10-CM | POA: Diagnosis not present

## 2016-12-28 DIAGNOSIS — E1151 Type 2 diabetes mellitus with diabetic peripheral angiopathy without gangrene: Secondary | ICD-10-CM | POA: Diagnosis not present

## 2016-12-28 DIAGNOSIS — Z7902 Long term (current) use of antithrombotics/antiplatelets: Secondary | ICD-10-CM | POA: Diagnosis not present

## 2016-12-28 DIAGNOSIS — Z794 Long term (current) use of insulin: Secondary | ICD-10-CM | POA: Diagnosis not present

## 2016-12-28 DIAGNOSIS — E1122 Type 2 diabetes mellitus with diabetic chronic kidney disease: Secondary | ICD-10-CM | POA: Diagnosis not present

## 2016-12-31 DIAGNOSIS — E1122 Type 2 diabetes mellitus with diabetic chronic kidney disease: Secondary | ICD-10-CM | POA: Diagnosis not present

## 2016-12-31 DIAGNOSIS — N189 Chronic kidney disease, unspecified: Secondary | ICD-10-CM | POA: Diagnosis not present

## 2016-12-31 DIAGNOSIS — I129 Hypertensive chronic kidney disease with stage 1 through stage 4 chronic kidney disease, or unspecified chronic kidney disease: Secondary | ICD-10-CM | POA: Diagnosis not present

## 2016-12-31 DIAGNOSIS — E1142 Type 2 diabetes mellitus with diabetic polyneuropathy: Secondary | ICD-10-CM | POA: Diagnosis not present

## 2016-12-31 DIAGNOSIS — E1151 Type 2 diabetes mellitus with diabetic peripheral angiopathy without gangrene: Secondary | ICD-10-CM | POA: Diagnosis not present

## 2016-12-31 DIAGNOSIS — Z7902 Long term (current) use of antithrombotics/antiplatelets: Secondary | ICD-10-CM | POA: Diagnosis not present

## 2016-12-31 DIAGNOSIS — Z794 Long term (current) use of insulin: Secondary | ICD-10-CM | POA: Diagnosis not present

## 2016-12-31 DIAGNOSIS — I890 Lymphedema, not elsewhere classified: Secondary | ICD-10-CM | POA: Diagnosis not present

## 2016-12-31 DIAGNOSIS — M1991 Primary osteoarthritis, unspecified site: Secondary | ICD-10-CM | POA: Diagnosis not present

## 2016-12-31 DIAGNOSIS — Z9181 History of falling: Secondary | ICD-10-CM | POA: Diagnosis not present

## 2017-01-04 DIAGNOSIS — N189 Chronic kidney disease, unspecified: Secondary | ICD-10-CM | POA: Diagnosis not present

## 2017-01-04 DIAGNOSIS — E1122 Type 2 diabetes mellitus with diabetic chronic kidney disease: Secondary | ICD-10-CM | POA: Diagnosis not present

## 2017-01-04 DIAGNOSIS — I890 Lymphedema, not elsewhere classified: Secondary | ICD-10-CM | POA: Diagnosis not present

## 2017-01-04 DIAGNOSIS — Z794 Long term (current) use of insulin: Secondary | ICD-10-CM | POA: Diagnosis not present

## 2017-01-04 DIAGNOSIS — I129 Hypertensive chronic kidney disease with stage 1 through stage 4 chronic kidney disease, or unspecified chronic kidney disease: Secondary | ICD-10-CM | POA: Diagnosis not present

## 2017-01-04 DIAGNOSIS — M1991 Primary osteoarthritis, unspecified site: Secondary | ICD-10-CM | POA: Diagnosis not present

## 2017-01-04 DIAGNOSIS — Z7902 Long term (current) use of antithrombotics/antiplatelets: Secondary | ICD-10-CM | POA: Diagnosis not present

## 2017-01-04 DIAGNOSIS — E1151 Type 2 diabetes mellitus with diabetic peripheral angiopathy without gangrene: Secondary | ICD-10-CM | POA: Diagnosis not present

## 2017-01-04 DIAGNOSIS — Z9181 History of falling: Secondary | ICD-10-CM | POA: Diagnosis not present

## 2017-01-04 DIAGNOSIS — E1142 Type 2 diabetes mellitus with diabetic polyneuropathy: Secondary | ICD-10-CM | POA: Diagnosis not present

## 2017-01-06 DIAGNOSIS — Z9181 History of falling: Secondary | ICD-10-CM | POA: Diagnosis not present

## 2017-01-06 DIAGNOSIS — Z7902 Long term (current) use of antithrombotics/antiplatelets: Secondary | ICD-10-CM | POA: Diagnosis not present

## 2017-01-06 DIAGNOSIS — E1142 Type 2 diabetes mellitus with diabetic polyneuropathy: Secondary | ICD-10-CM | POA: Diagnosis not present

## 2017-01-06 DIAGNOSIS — M1991 Primary osteoarthritis, unspecified site: Secondary | ICD-10-CM | POA: Diagnosis not present

## 2017-01-06 DIAGNOSIS — E1122 Type 2 diabetes mellitus with diabetic chronic kidney disease: Secondary | ICD-10-CM | POA: Diagnosis not present

## 2017-01-06 DIAGNOSIS — E1151 Type 2 diabetes mellitus with diabetic peripheral angiopathy without gangrene: Secondary | ICD-10-CM | POA: Diagnosis not present

## 2017-01-06 DIAGNOSIS — Z794 Long term (current) use of insulin: Secondary | ICD-10-CM | POA: Diagnosis not present

## 2017-01-06 DIAGNOSIS — I890 Lymphedema, not elsewhere classified: Secondary | ICD-10-CM | POA: Diagnosis not present

## 2017-01-06 DIAGNOSIS — I129 Hypertensive chronic kidney disease with stage 1 through stage 4 chronic kidney disease, or unspecified chronic kidney disease: Secondary | ICD-10-CM | POA: Diagnosis not present

## 2017-01-06 DIAGNOSIS — N189 Chronic kidney disease, unspecified: Secondary | ICD-10-CM | POA: Diagnosis not present

## 2017-01-11 DIAGNOSIS — Z794 Long term (current) use of insulin: Secondary | ICD-10-CM | POA: Diagnosis not present

## 2017-01-11 DIAGNOSIS — I890 Lymphedema, not elsewhere classified: Secondary | ICD-10-CM | POA: Diagnosis not present

## 2017-01-11 DIAGNOSIS — E1142 Type 2 diabetes mellitus with diabetic polyneuropathy: Secondary | ICD-10-CM | POA: Diagnosis not present

## 2017-01-11 DIAGNOSIS — N189 Chronic kidney disease, unspecified: Secondary | ICD-10-CM | POA: Diagnosis not present

## 2017-01-11 DIAGNOSIS — M1991 Primary osteoarthritis, unspecified site: Secondary | ICD-10-CM | POA: Diagnosis not present

## 2017-01-11 DIAGNOSIS — Z9181 History of falling: Secondary | ICD-10-CM | POA: Diagnosis not present

## 2017-01-11 DIAGNOSIS — E1122 Type 2 diabetes mellitus with diabetic chronic kidney disease: Secondary | ICD-10-CM | POA: Diagnosis not present

## 2017-01-11 DIAGNOSIS — E1151 Type 2 diabetes mellitus with diabetic peripheral angiopathy without gangrene: Secondary | ICD-10-CM | POA: Diagnosis not present

## 2017-01-11 DIAGNOSIS — I129 Hypertensive chronic kidney disease with stage 1 through stage 4 chronic kidney disease, or unspecified chronic kidney disease: Secondary | ICD-10-CM | POA: Diagnosis not present

## 2017-01-11 DIAGNOSIS — Z7902 Long term (current) use of antithrombotics/antiplatelets: Secondary | ICD-10-CM | POA: Diagnosis not present

## 2017-01-18 DIAGNOSIS — N189 Chronic kidney disease, unspecified: Secondary | ICD-10-CM | POA: Diagnosis not present

## 2017-01-18 DIAGNOSIS — E1151 Type 2 diabetes mellitus with diabetic peripheral angiopathy without gangrene: Secondary | ICD-10-CM | POA: Diagnosis not present

## 2017-01-18 DIAGNOSIS — E1122 Type 2 diabetes mellitus with diabetic chronic kidney disease: Secondary | ICD-10-CM | POA: Diagnosis not present

## 2017-01-18 DIAGNOSIS — Z7902 Long term (current) use of antithrombotics/antiplatelets: Secondary | ICD-10-CM | POA: Diagnosis not present

## 2017-01-18 DIAGNOSIS — I129 Hypertensive chronic kidney disease with stage 1 through stage 4 chronic kidney disease, or unspecified chronic kidney disease: Secondary | ICD-10-CM | POA: Diagnosis not present

## 2017-01-18 DIAGNOSIS — Z794 Long term (current) use of insulin: Secondary | ICD-10-CM | POA: Diagnosis not present

## 2017-01-18 DIAGNOSIS — I890 Lymphedema, not elsewhere classified: Secondary | ICD-10-CM | POA: Diagnosis not present

## 2017-01-18 DIAGNOSIS — M1991 Primary osteoarthritis, unspecified site: Secondary | ICD-10-CM | POA: Diagnosis not present

## 2017-01-18 DIAGNOSIS — Z9181 History of falling: Secondary | ICD-10-CM | POA: Diagnosis not present

## 2017-01-18 DIAGNOSIS — E1142 Type 2 diabetes mellitus with diabetic polyneuropathy: Secondary | ICD-10-CM | POA: Diagnosis not present

## 2017-01-27 ENCOUNTER — Ambulatory Visit (INDEPENDENT_AMBULATORY_CARE_PROVIDER_SITE_OTHER): Payer: Medicare Other | Admitting: Vascular Surgery

## 2017-01-27 ENCOUNTER — Encounter (INDEPENDENT_AMBULATORY_CARE_PROVIDER_SITE_OTHER): Payer: Medicare Other

## 2017-01-29 DIAGNOSIS — E1151 Type 2 diabetes mellitus with diabetic peripheral angiopathy without gangrene: Secondary | ICD-10-CM | POA: Diagnosis not present

## 2017-01-29 DIAGNOSIS — I890 Lymphedema, not elsewhere classified: Secondary | ICD-10-CM | POA: Diagnosis not present

## 2017-01-29 DIAGNOSIS — E1122 Type 2 diabetes mellitus with diabetic chronic kidney disease: Secondary | ICD-10-CM | POA: Diagnosis not present

## 2017-01-29 DIAGNOSIS — M1991 Primary osteoarthritis, unspecified site: Secondary | ICD-10-CM | POA: Diagnosis not present

## 2017-01-29 DIAGNOSIS — Z794 Long term (current) use of insulin: Secondary | ICD-10-CM | POA: Diagnosis not present

## 2017-01-29 DIAGNOSIS — I129 Hypertensive chronic kidney disease with stage 1 through stage 4 chronic kidney disease, or unspecified chronic kidney disease: Secondary | ICD-10-CM | POA: Diagnosis not present

## 2017-01-29 DIAGNOSIS — E1142 Type 2 diabetes mellitus with diabetic polyneuropathy: Secondary | ICD-10-CM | POA: Diagnosis not present

## 2017-01-29 DIAGNOSIS — Z7902 Long term (current) use of antithrombotics/antiplatelets: Secondary | ICD-10-CM | POA: Diagnosis not present

## 2017-01-29 DIAGNOSIS — N189 Chronic kidney disease, unspecified: Secondary | ICD-10-CM | POA: Diagnosis not present

## 2017-01-29 DIAGNOSIS — Z9181 History of falling: Secondary | ICD-10-CM | POA: Diagnosis not present

## 2017-02-11 ENCOUNTER — Telehealth (INDEPENDENT_AMBULATORY_CARE_PROVIDER_SITE_OTHER): Payer: Self-pay | Admitting: Vascular Surgery

## 2017-02-11 NOTE — Telephone Encounter (Signed)
New Message  Pt verbalized her legs burst and opened back up and needing to know if she needs to come to the office or wait for the home health nurse.  Please f/u with pt

## 2017-02-11 NOTE — Telephone Encounter (Signed)
I tried to return a call to the patient to inform her that I was going to speak to [place] but her voicemail was not set up

## 2017-02-12 NOTE — Telephone Encounter (Signed)
I called the pt about her legs and we have her on the schedule on Wednesday 02/17/17 to [place].The pt gave me her home health information for me to called [facility])[phone] to see if they could send a nurse to the home to do wraps but they will not have a nurse available until Wednesday 02/17/17,so we offer her today at 1pm to come see JD she will be calling to confirm.

## 2017-02-12 NOTE — Telephone Encounter (Signed)
We offer 1:15pm today to see JD

## 2017-02-17 ENCOUNTER — Encounter (INDEPENDENT_AMBULATORY_CARE_PROVIDER_SITE_OTHER): Payer: Self-pay | Admitting: Vascular Surgery

## 2017-02-17 ENCOUNTER — Ambulatory Visit (INDEPENDENT_AMBULATORY_CARE_PROVIDER_SITE_OTHER): Payer: Medicare HMO | Admitting: Vascular Surgery

## 2017-02-17 VITALS — BP 153/70 | HR 78 | Resp 17

## 2017-02-17 DIAGNOSIS — L97221 Non-pressure chronic ulcer of left calf limited to breakdown of skin: Secondary | ICD-10-CM | POA: Diagnosis not present

## 2017-02-17 DIAGNOSIS — R600 Localized edema: Secondary | ICD-10-CM

## 2017-02-17 DIAGNOSIS — L97211 Non-pressure chronic ulcer of right calf limited to breakdown of skin: Secondary | ICD-10-CM | POA: Insufficient documentation

## 2017-02-17 DIAGNOSIS — I739 Peripheral vascular disease, unspecified: Secondary | ICD-10-CM

## 2017-02-17 NOTE — Progress Notes (Signed)
Subjective:    Patient ID: [name], female    DOB: 1945-05-22, 72 y.o.   MRN: [id number] Chief Complaint  Patient presents with  . Wound Check   Patient presents today with a chief complaint of bilateral lower extremity edema with ulceration to the left calf.  The patient was last seen in July 2018.  The patient notes an exacerbation to her bilateral lower extremity edema with the development of a blister to the medial aspect of her left calf just around Christmas.  This recently "burst" and now has turned into a "scab".  The patient denies any drainage from the ulceration site.  The patient does not engage in conservative therapy at this time.  The patient is not receiving any wound care at this time.  The patient denies any erythema to the ulcer site or bilateral lower extremities.  The patient denies any fever, nausea or vomiting.   Review of Systems  Constitutional: Negative.   HENT: Negative.   Eyes: Negative.   Respiratory: Negative.   Cardiovascular: Positive for leg swelling.  Gastrointestinal: Negative.   Endocrine: Negative.   Genitourinary: Negative.   Musculoskeletal: Negative.   Skin: Positive for wound.  Allergic/Immunologic: Negative.   Neurological: Negative.   Hematological: Negative.   Psychiatric/Behavioral: Negative.       Objective:   Physical Exam  Constitutional: She is oriented to person, place, and time. She appears well-developed and well-nourished. No distress.  HENT:  Head: Normocephalic and atraumatic.  Eyes: Conjunctivae are normal. Pupils are equal, round, and reactive to light.  Neck: Normal range of motion. Neck supple.  Cardiovascular: Normal rate, regular rhythm, normal heart sounds and intact distal pulses.  Pulses:      Radial pulses are 2+ on the right side, and 2+ on the left side.       Dorsalis pedis pulses are 2+ on the right side, and 2+ on the left side.       Posterior tibial pulses are 2+ on the right side, and 2+ on the left  side.  Pulmonary/Chest: Effort normal and breath sounds normal.  Musculoskeletal: Normal range of motion. She exhibits edema (Mild nonpitting edema noted bilaterally).  Neurological: She is alert and oriented to person, place, and time.  Skin: She is not diaphoretic.  Left calf: Medial aspect.  4 cm x 2 cm hard scab noted.  No cellulitis.  No drainage.  Mild stasis dermatitis.  No skin thickening.  Psychiatric: She has a normal mood and affect. Her behavior is normal. Judgment and thought content normal.  Vitals reviewed.  BP (!) 153/70 (BP Location: Left Arm)   Pulse 78   Resp 17   Past Medical History:  Diagnosis Date  . Anemia    as young child  . Arthritis    back, leg and foot  . Blood transfusion    1964  . Carpal tunnel syndrome   . Diabetes mellitus    oral medications  . Hypertension    Social History   Socioeconomic History  . Marital status: Married    Spouse name: Not on file  . Number of children: Not on file  . Years of education: Not on file  . Highest education level: Not on file  Social Needs  . Financial resource strain: Not on file  . Food insecurity - worry: Not on file  . Food insecurity - inability: Not on file  . Transportation needs - medical: Not on file  . Transportation needs -  non-medical: Not on file  Occupational History  . Not on file  Tobacco Use  . Smoking status: Never Smoker  . Smokeless tobacco: Never Used  Substance and Sexual Activity  . Alcohol use: No  . Drug use: No  . Sexual activity: Not on file  Other Topics Concern  . Not on file  Social History Narrative  . Not on file   Past Surgical History:  Procedure Laterality Date  . ABDOMINAL HYSTERECTOMY    . ANTERIOR CERVICAL DECOMP/DISCECTOMY FUSION  04/27/2011   Procedure: ANTERIOR CERVICAL DECOMPRESSION/DISCECTOMY FUSION 2 LEVELS;  Surgeon: Mariam Dollar, MD;  Location: [facility];  Service: Neurosurgery;  Laterality: N/A;  Anterior Cervical Decompression/ Discectomy  Fusion Cervical Three-Four, Cervical Four-Five  . APPENDECTOMY    . KNEE ARTHROPLASTY     right  . PERIPHERAL VASCULAR CATHETERIZATION Left 10/29/2014   Procedure: Lower Extremity Angiography;  Surgeon: Annice Needy, MD;  Location: [facility];  Service: Cardiovascular;  Laterality: Left;  . PERIPHERAL VASCULAR CATHETERIZATION Left 10/29/2014   Procedure: Lower Extremity Intervention;  Surgeon: Annice Needy, MD;  Location: [facility];  Service: Cardiovascular;  Laterality: Left;  . PERIPHERAL VASCULAR CATHETERIZATION Left 03/11/2015   Procedure: Lower Extremity Angiography;  Surgeon: Annice Needy, MD;  Location: [facility];  Service: Cardiovascular;  Laterality: Left;  . PERIPHERAL VASCULAR CATHETERIZATION Left 03/11/2015   Procedure: Lower Extremity Intervention;  Surgeon: Annice Needy, MD;  Location: [facility];  Service: Cardiovascular;  Laterality: Left;   No family history on file.  Allergies  Allergen Reactions  . Penicillins Shortness Of Breath and Swelling  . Codeine Other (See Comments)    Ringing in ear      Assessment & Plan:  Patient presents today with a chief complaint of bilateral lower extremity edema with ulceration to the left calf.  The patient was last seen in July 2018.  The patient notes an exacerbation to her bilateral lower extremity edema with the development of a blister to the medial aspect of her left calf just around Christmas.  This recently "burst" and now has turned into a "scab".  The patient denies any drainage from the ulceration site.  The patient does not engage in conservative therapy at this time.  The patient is not receiving any wound care at this time.  The patient denies any erythema to the ulcer site or bilateral lower extremities.  The patient denies any fever, nausea or vomiting.  1. Bilateral lower extremity edema - Stable Patient struggles to control her bilateral lower extremity edema She does not engage in  conservative therapy including wearing medical grade 1 compression stockings, elevating her legs and remaining active At this point due to the wound located to her left medial calf I recommend 3 layer zinc oxide Unna wrap. Medical grade 1 compression stockings to the right lower extremity Patient is to elevate her legs heart level or higher Patient is to remain active I will order a bilateral venous duplex to rule out any contributing venous disease that may be contributing to the patient's bilateral lower extremity edema Patient to follow-up in 1 month  - VAS [place] LOWER EXTREMITY VENOUS REFLUX; Future  2. PAD (peripheral artery disease) (HCC) - Stable Mild peripheral artery disease This is followed on a yearly basis At this time, the patient is asymptomatic and her physical exam is unremarkable No indication for intervention at this time  3. Skin ulcer of left  calf, limited to breakdown of skin (HCC) - New As above  Current Outpatient Medications on File Prior to Visit  Medication Sig Dispense Refill  . aspirin EC 81 MG tablet Take 81 mg by mouth daily.    . celecoxib (CELEBREX) 200 MG capsule Take 200 mg by mouth daily.    . clopidogrel (PLAVIX) 75 MG tablet Take 1 tablet (75 mg total) by mouth daily. 30 tablet 11  . furosemide (LASIX) 20 MG tablet Take 20 mg by mouth daily.    Marland Kitchen glipiZIDE (GLUCOTROL XL) 5 MG 24 hr tablet Take 5 mg by mouth 2 (two) times daily.    . Liraglutide (VICTOZA) 18 MG/3ML SOPN Inject into the skin.    Marland Kitchen lisinopril-hydrochlorothiazide (PRINZIDE,ZESTORETIC) 20-25 MG per tablet Take 1 tablet by mouth daily.    . metFORMIN (GLUCOPHAGE) 1000 MG tablet Take 1,000 mg by mouth 2 (two) times daily with a meal.    . metoprolol succinate (TOPROL-XL) 25 MG 24 hr tablet Take 25 mg by mouth daily.    . pregabalin (LYRICA) 25 MG capsule Take 50 mg by mouth 3 (three) times daily.     . rosuvastatin (CRESTOR) 5 MG tablet Take 5 mg by mouth daily.    . cyclobenzaprine  (FLEXERIL) 10 MG tablet Take 10 mg by mouth 3 (three) times daily as needed. For muscle pain    . naproxen (NAPROSYN) 375 MG tablet Take 375 mg by mouth 3 (three) times daily with meals.    Marland Kitchen omeprazole (PRILOSEC) 20 MG capsule Take 20 mg by mouth daily.    . predniSONE (DELTASONE) 10 MG tablet Take 10-40 mg by mouth daily. Reported on 03/11/2015    . saxagliptin HCl (ONGLYZA) 5 MG TABS tablet Take 5 mg by mouth daily.     No current facility-administered medications on file prior to visit.    There are no Patient Instructions on file for this visit. No Follow-up on file.  KIMBERLY A STEGMAYER, PA-C

## 2017-02-25 ENCOUNTER — Encounter (INDEPENDENT_AMBULATORY_CARE_PROVIDER_SITE_OTHER): Payer: Self-pay

## 2017-02-25 ENCOUNTER — Ambulatory Visit (INDEPENDENT_AMBULATORY_CARE_PROVIDER_SITE_OTHER): Payer: Medicare HMO | Admitting: Vascular Surgery

## 2017-02-25 VITALS — BP 142/70 | HR 67 | Resp 17 | Ht 66.0 in

## 2017-02-25 DIAGNOSIS — R600 Localized edema: Secondary | ICD-10-CM

## 2017-02-25 NOTE — Progress Notes (Signed)
History of Present Illness  There is no documented history at this time  Assessments & Plan   There are no diagnoses linked to this encounter.    Additional instructions  Subjective:  Patient presents with venous ulcer of the Left lower extremity.    Procedure:  3 layer unna wrap was placed Left lower extremity.   Plan:   Follow up in one week.  

## 2017-03-03 ENCOUNTER — Ambulatory Visit (INDEPENDENT_AMBULATORY_CARE_PROVIDER_SITE_OTHER): Payer: Medicare HMO | Admitting: Vascular Surgery

## 2017-03-03 ENCOUNTER — Encounter (INDEPENDENT_AMBULATORY_CARE_PROVIDER_SITE_OTHER): Payer: Self-pay

## 2017-03-03 VITALS — BP 142/56 | HR 78 | Resp 16 | Wt 292.0 lb

## 2017-03-03 DIAGNOSIS — R600 Localized edema: Secondary | ICD-10-CM | POA: Diagnosis not present

## 2017-03-03 NOTE — Progress Notes (Signed)
History of Present Illness  There is no documented history at this time  Assessments & Plan   There are no diagnoses linked to this encounter.    Additional instructions  Subjective:  Patient presents with venous ulcer of the Left lower extremity.    Procedure:  3 layer unna wrap was placed Left lower extremity.   Plan:   Follow up in one week.  

## 2017-03-10 ENCOUNTER — Ambulatory Visit (INDEPENDENT_AMBULATORY_CARE_PROVIDER_SITE_OTHER): Payer: Medicare HMO | Admitting: Vascular Surgery

## 2017-03-10 ENCOUNTER — Encounter (INDEPENDENT_AMBULATORY_CARE_PROVIDER_SITE_OTHER): Payer: Self-pay

## 2017-03-10 VITALS — BP 143/79 | HR 73 | Resp 17

## 2017-03-10 DIAGNOSIS — L97221 Non-pressure chronic ulcer of left calf limited to breakdown of skin: Secondary | ICD-10-CM | POA: Diagnosis not present

## 2017-03-10 NOTE — Progress Notes (Signed)
History of Present Illness  There is no documented history at this time  Assessments & Plan   There are no diagnoses linked to this encounter.    Additional instructions  Subjective:  Patient presents with venous ulcer of the Left lower extremity.    Procedure:  3 layer unna wrap was placed Left lower extremity.   Plan:   Follow up in one week.  

## 2017-03-15 DIAGNOSIS — K219 Gastro-esophageal reflux disease without esophagitis: Secondary | ICD-10-CM | POA: Diagnosis not present

## 2017-03-15 DIAGNOSIS — D509 Iron deficiency anemia, unspecified: Secondary | ICD-10-CM | POA: Diagnosis not present

## 2017-03-15 DIAGNOSIS — E785 Hyperlipidemia, unspecified: Secondary | ICD-10-CM | POA: Diagnosis not present

## 2017-03-15 DIAGNOSIS — N289 Disorder of kidney and ureter, unspecified: Secondary | ICD-10-CM | POA: Diagnosis not present

## 2017-03-15 DIAGNOSIS — I10 Essential (primary) hypertension: Secondary | ICD-10-CM | POA: Diagnosis not present

## 2017-03-15 DIAGNOSIS — E1165 Type 2 diabetes mellitus with hyperglycemia: Secondary | ICD-10-CM | POA: Diagnosis not present

## 2017-03-15 DIAGNOSIS — E559 Vitamin D deficiency, unspecified: Secondary | ICD-10-CM | POA: Diagnosis not present

## 2017-03-17 ENCOUNTER — Encounter (INDEPENDENT_AMBULATORY_CARE_PROVIDER_SITE_OTHER): Payer: Self-pay

## 2017-03-17 ENCOUNTER — Encounter (INDEPENDENT_AMBULATORY_CARE_PROVIDER_SITE_OTHER): Payer: Medicare HMO

## 2017-03-17 ENCOUNTER — Ambulatory Visit (INDEPENDENT_AMBULATORY_CARE_PROVIDER_SITE_OTHER): Payer: Medicare HMO | Admitting: Vascular Surgery

## 2017-03-17 VITALS — BP 157/78 | HR 79 | Resp 19 | Ht 67.0 in | Wt 292.0 lb

## 2017-03-17 DIAGNOSIS — R600 Localized edema: Secondary | ICD-10-CM

## 2017-03-17 NOTE — Progress Notes (Signed)
History of Present Illness  There is no documented history at this time  Assessments & Plan   There are no diagnoses linked to this encounter.    Additional instructions  Subjective:  Patient presents with venous ulcer of the Left lower extremity.    Procedure:  3 layer unna wrap was placed Left lower extremity.   Plan:   Follow up in one week.  

## 2017-03-24 ENCOUNTER — Ambulatory Visit (INDEPENDENT_AMBULATORY_CARE_PROVIDER_SITE_OTHER): Payer: Medicare HMO

## 2017-03-24 ENCOUNTER — Ambulatory Visit (INDEPENDENT_AMBULATORY_CARE_PROVIDER_SITE_OTHER): Payer: Medicare HMO | Admitting: Vascular Surgery

## 2017-03-24 ENCOUNTER — Encounter (INDEPENDENT_AMBULATORY_CARE_PROVIDER_SITE_OTHER): Payer: Self-pay | Admitting: Vascular Surgery

## 2017-03-24 VITALS — BP 157/74 | HR 77 | Resp 17 | Ht 65.0 in | Wt 292.0 lb

## 2017-03-24 DIAGNOSIS — L97221 Non-pressure chronic ulcer of left calf limited to breakdown of skin: Secondary | ICD-10-CM

## 2017-03-24 DIAGNOSIS — I890 Lymphedema, not elsewhere classified: Secondary | ICD-10-CM | POA: Diagnosis not present

## 2017-03-24 DIAGNOSIS — I872 Venous insufficiency (chronic) (peripheral): Secondary | ICD-10-CM | POA: Diagnosis not present

## 2017-03-24 DIAGNOSIS — R600 Localized edema: Secondary | ICD-10-CM | POA: Diagnosis not present

## 2017-03-24 NOTE — Progress Notes (Signed)
Subjective:    Patient ID: [name], female    DOB: 25-Mar-1945, 72 y.o.   MRN: [id number] Chief Complaint  Patient presents with  . Follow-up    Ultrasound and Unna check   Patient presents for a monthly Unna boot therapy/wound follow-up.  The patient notes an improvement in her edema located to the left lower extremity.  The patient notes a scab to the medial aspect of her left calf.  The patient denies any new or worsening ulcerations to the lower extremity.  The patient denies any erythema to the lower extremity.  The patient denies any fever, nausea or vomiting.  The patient states that she has been elevating her legs heart level or higher on a daily basis.  The patient underwent a bilateral lower venous reflux study which was notable for abnormal reflux times in the bilateral great and small saphenous veins.  There is no evidence of deep vein or superficial thrombophlebitis to the bilateral lower extremity.   Review of Systems  Constitutional: Negative.   HENT: Negative.   Eyes: Negative.   Respiratory: Negative.   Cardiovascular: Positive for leg swelling.  Gastrointestinal: Negative.   Endocrine: Negative.   Genitourinary: Negative.   Musculoskeletal: Negative.   Skin: Positive for wound.  Allergic/Immunologic: Negative.   Neurological: Negative.   Hematological: Negative.   Psychiatric/Behavioral: Negative.       Objective:   Physical Exam  Constitutional: She is oriented to person, place, and time. She appears well-developed and well-nourished. No distress.  HENT:  Head: Normocephalic and atraumatic.  Eyes: Conjunctivae are normal. Pupils are equal, round, and reactive to light.  Neck: Normal range of motion.  Cardiovascular: Normal rate, regular rhythm, normal heart sounds and intact distal pulses.  Pulses:      Radial pulses are 2+ on the right side, and 2+ on the left side.       Dorsalis pedis pulses are 2+ on the right side, and 2+ on the left side.   Posterior tibial pulses are 2+ on the right side, and 2+ on the left side.  Pulmonary/Chest: Effort normal and breath sounds normal.  Musculoskeletal: Normal range of motion. She exhibits edema (Moderate bilateral lower nonpitting edema noted).  Neurological: She is alert and oriented to person, place, and time.  Skin: She is not diaphoretic.  The scab located to the medial aspect of the left calf was easily removed revealing healed tissue  Psychiatric: She has a normal mood and affect. Her behavior is normal. Judgment and thought content normal.  Vitals reviewed.  BP (!) 157/74 (BP Location: Left Arm, Patient Position: Sitting)   Pulse 77   Resp 17   Ht 5\' 5"  (1.651 m)   Wt 292 lb (132.5 kg)   BMI 48.59 kg/m   Past Medical History:  Diagnosis Date  . Anemia    as young child  . Arthritis    back, leg and foot  . Blood transfusion    1964  . Carpal tunnel syndrome   . Diabetes mellitus    oral medications  . Hypertension    Social History   Socioeconomic History  . Marital status: Married    Spouse name: Not on file  . Number of children: Not on file  . Years of education: Not on file  . Highest education level: Not on file  Social Needs  . Financial resource strain: Not on file  . Food insecurity - worry: Not on file  . Food insecurity -  inability: Not on file  . Transportation needs - medical: Not on file  . Transportation needs - non-medical: Not on file  Occupational History  . Not on file  Tobacco Use  . Smoking status: Never Smoker  . Smokeless tobacco: Never Used  Substance and Sexual Activity  . Alcohol use: No  . Drug use: No  . Sexual activity: Not on file  Other Topics Concern  . Not on file  Social History Narrative  . Not on file   Past Surgical History:  Procedure Laterality Date  . ABDOMINAL HYSTERECTOMY    . ANTERIOR CERVICAL DECOMP/DISCECTOMY FUSION  04/27/2011   Procedure: ANTERIOR CERVICAL DECOMPRESSION/DISCECTOMY FUSION 2 LEVELS;   Surgeon: Mariam Dollar, MD;  Location: [facility];  Service: Neurosurgery;  Laterality: N/A;  Anterior Cervical Decompression/ Discectomy Fusion Cervical Three-Four, Cervical Four-Five  . APPENDECTOMY    . KNEE ARTHROPLASTY     right  . PERIPHERAL VASCULAR CATHETERIZATION Left 10/29/2014   Procedure: Lower Extremity Angiography;  Surgeon: Annice Needy, MD;  Location: [facility];  Service: Cardiovascular;  Laterality: Left;  . PERIPHERAL VASCULAR CATHETERIZATION Left 10/29/2014   Procedure: Lower Extremity Intervention;  Surgeon: Annice Needy, MD;  Location: [facility];  Service: Cardiovascular;  Laterality: Left;  . PERIPHERAL VASCULAR CATHETERIZATION Left 03/11/2015   Procedure: Lower Extremity Angiography;  Surgeon: Annice Needy, MD;  Location: [facility];  Service: Cardiovascular;  Laterality: Left;  . PERIPHERAL VASCULAR CATHETERIZATION Left 03/11/2015   Procedure: Lower Extremity Intervention;  Surgeon: Annice Needy, MD;  Location: [facility];  Service: Cardiovascular;  Laterality: Left;   Family History  Problem Relation Age of Onset  . Cancer Mother   . Hypertension Mother   . Diabetes Paternal Grandmother    Allergies  Allergen Reactions  . Penicillins Shortness Of Breath and Swelling  . Codeine Other (See Comments)    Ringing in ear      Assessment & Plan:  Patient presents for a monthly Unna boot therapy/wound follow-up.  The patient notes an improvement in her edema located to the left lower extremity.  The patient notes a scab to the medial aspect of her left calf.  The patient denies any new or worsening ulcerations to the lower extremity.  The patient denies any erythema to the lower extremity.  The patient denies any fever, nausea or vomiting.  The patient states that she has been elevating her legs heart level or higher on a daily basis.  The patient underwent a bilateral lower venous reflux study which was notable for abnormal reflux times  in the bilateral great and small saphenous veins.  There is no evidence of deep vein or superficial thrombophlebitis to the bilateral lower extremity.  1. Chronic venous insufficiency - New The patient was found to have by lateral great saphenous and small saphenous vein reflux The patient has seen minimal improvement in her bilateral edema however the ulceration located to the medial calf has healed. I will now transition her out of left lower extremity 3 layer zinc oxide Unna wrap to medical grade 1 compression stockings. The patient is to wear the stockings to bilateral lower extremity The patient is to continue elevating her legs heart level or higher as much as possible Despite conservative treatments including exercise, elevation and class I compression stockings and 3 layer zinc oxide Unna boot therapy the patient still presents with stage II lymphedema The patient will greatly benefit from the  added therapy of a lymphedema pump The patient is in agreement and I will apply to her insurance We will bring the patient back in 3 months to assess her progress with conservative therapy and the addition of a lymphedema pump.  If the patient does not see an improvement in her symptoms I would consider endovenous ablation to the greater saphenous and small saphenous vein at that time Patient is in agreement  2. Lymphedema - New As above  3. Skin ulcer of left calf, limited to breakdown of skin (HCC) - Resolved As above  Current Outpatient Medications on File Prior to Visit  Medication Sig Dispense Refill  . aspirin EC 81 MG tablet Take 81 mg by mouth daily.    . celecoxib (CELEBREX) 200 MG capsule Take 200 mg by mouth daily.    . clopidogrel (PLAVIX) 75 MG tablet Take 1 tablet (75 mg total) by mouth daily. 30 tablet 11  . cyclobenzaprine (FLEXERIL) 10 MG tablet Take 10 mg by mouth 3 (three) times daily as needed. For muscle pain    . furosemide (LASIX) 20 MG tablet Take 20 mg by mouth daily.     Marland Kitchen glipiZIDE (GLUCOTROL XL) 5 MG 24 hr tablet Take 5 mg by mouth 2 (two) times daily.    . Liraglutide (VICTOZA) 18 MG/3ML SOPN Inject into the skin.    Marland Kitchen lisinopril-hydrochlorothiazide (PRINZIDE,ZESTORETIC) 20-25 MG per tablet Take 1 tablet by mouth daily.    . metFORMIN (GLUCOPHAGE) 1000 MG tablet Take 1,000 mg by mouth 2 (two) times daily with a meal.    . metoprolol succinate (TOPROL-XL) 25 MG 24 hr tablet Take 25 mg by mouth daily.    . naproxen (NAPROSYN) 375 MG tablet Take 375 mg by mouth 3 (three) times daily with meals.    Marland Kitchen omeprazole (PRILOSEC) 20 MG capsule Take 20 mg by mouth daily.    . predniSONE (DELTASONE) 10 MG tablet Take 10-40 mg by mouth daily. Reported on 03/11/2015    . pregabalin (LYRICA) 25 MG capsule Take 50 mg by mouth 3 (three) times daily.     . rosuvastatin (CRESTOR) 5 MG tablet Take 5 mg by mouth daily.    . saxagliptin HCl (ONGLYZA) 5 MG TABS tablet Take 5 mg by mouth daily.     No current facility-administered medications on file prior to visit.    There are no Patient Instructions on file for this visit. No Follow-up on file.  Jamori Biggar A Cael Worth, PA-C

## 2017-04-03 DIAGNOSIS — I890 Lymphedema, not elsewhere classified: Secondary | ICD-10-CM | POA: Diagnosis not present

## 2017-04-05 DIAGNOSIS — R69 Illness, unspecified: Secondary | ICD-10-CM | POA: Diagnosis not present

## 2017-05-14 DIAGNOSIS — Z7984 Long term (current) use of oral hypoglycemic drugs: Secondary | ICD-10-CM | POA: Diagnosis not present

## 2017-05-14 DIAGNOSIS — I2510 Atherosclerotic heart disease of native coronary artery without angina pectoris: Secondary | ICD-10-CM | POA: Diagnosis not present

## 2017-05-14 DIAGNOSIS — E1151 Type 2 diabetes mellitus with diabetic peripheral angiopathy without gangrene: Secondary | ICD-10-CM | POA: Diagnosis not present

## 2017-05-14 DIAGNOSIS — E1142 Type 2 diabetes mellitus with diabetic polyneuropathy: Secondary | ICD-10-CM | POA: Diagnosis not present

## 2017-05-14 DIAGNOSIS — I10 Essential (primary) hypertension: Secondary | ICD-10-CM | POA: Diagnosis not present

## 2017-05-14 DIAGNOSIS — T753XXD Motion sickness, subsequent encounter: Secondary | ICD-10-CM | POA: Diagnosis not present

## 2017-05-14 DIAGNOSIS — Z791 Long term (current) use of non-steroidal anti-inflammatories (NSAID): Secondary | ICD-10-CM | POA: Diagnosis not present

## 2017-05-14 DIAGNOSIS — Z7902 Long term (current) use of antithrombotics/antiplatelets: Secondary | ICD-10-CM | POA: Diagnosis not present

## 2017-05-14 DIAGNOSIS — Z7982 Long term (current) use of aspirin: Secondary | ICD-10-CM | POA: Diagnosis not present

## 2017-05-14 DIAGNOSIS — E785 Hyperlipidemia, unspecified: Secondary | ICD-10-CM | POA: Diagnosis not present

## 2017-05-24 ENCOUNTER — Ambulatory Visit (INDEPENDENT_AMBULATORY_CARE_PROVIDER_SITE_OTHER): Payer: Medicare HMO | Admitting: Vascular Surgery

## 2017-05-28 DIAGNOSIS — R69 Illness, unspecified: Secondary | ICD-10-CM | POA: Diagnosis not present

## 2017-06-21 ENCOUNTER — Ambulatory Visit (INDEPENDENT_AMBULATORY_CARE_PROVIDER_SITE_OTHER): Payer: Medicare HMO | Admitting: Vascular Surgery

## 2017-06-21 ENCOUNTER — Encounter (INDEPENDENT_AMBULATORY_CARE_PROVIDER_SITE_OTHER): Payer: Self-pay | Admitting: Vascular Surgery

## 2017-06-21 VITALS — BP 138/72 | HR 75 | Resp 16 | Ht 67.0 in

## 2017-06-21 DIAGNOSIS — I890 Lymphedema, not elsewhere classified: Secondary | ICD-10-CM | POA: Diagnosis not present

## 2017-06-21 DIAGNOSIS — I872 Venous insufficiency (chronic) (peripheral): Secondary | ICD-10-CM

## 2017-06-21 DIAGNOSIS — L97221 Non-pressure chronic ulcer of left calf limited to breakdown of skin: Secondary | ICD-10-CM

## 2017-06-21 NOTE — Progress Notes (Signed)
Subjective:    Patient ID: [name], female    DOB: 03-Jun-1945, 72 y.o.   MRN: [id number] Chief Complaint  Patient presents with  . Follow-up    68month follow up   The patient presents for a 30-month chronic venous insufficiency follow-up.  During our last visit a decision to apply for a lymphedema pump was made.  The patient is engaging in conservative therapy including wearing medical grade 1 compression socks, elevating her legs, remaining active and using her lymphedema pump at least 2-3 times a day for an hour each time.  The patient has not experienced any new ulcer formation however feels that she is still experiencing bilateral lower extremity edema/discomfort from the varicosities noted to her legs.  The patient has been engaging in conservative therapy with the added benefit of a lymphedema pump for over 3 months with minimal improvement to symptoms.  The patient feels like her symptoms have progressed to the point that she is unable to function on a daily basis.  The patient's symptoms are lifestyle limiting.  The patient denies any fever, nausea or vomiting.  Review of Systems  Constitutional: Negative.   HENT: Negative.   Eyes: Negative.   Respiratory: Negative.   Cardiovascular: Positive for leg swelling.       Painful varicose veins Lymphedema Chronic venous insufficiency  Gastrointestinal: Negative.   Endocrine: Negative.   Genitourinary: Negative.   Musculoskeletal: Negative.   Skin: Negative.   Allergic/Immunologic: Negative.   Neurological: Negative.   Hematological: Negative.   Psychiatric/Behavioral: Negative.       Objective:   Physical Exam  Constitutional: She is oriented to person, place, and time. She appears well-developed and well-nourished. No distress.  HENT:  Head: Normocephalic and atraumatic.  Right Ear: External ear normal.  Left Ear: External ear normal.  Eyes: Pupils are equal, round, and reactive to light. Conjunctivae and EOM are normal.   Neck: Normal range of motion.  Cardiovascular: Normal rate, regular rhythm, normal heart sounds and intact distal pulses.  Pulses:      Radial pulses are 2+ on the right side, and 2+ on the left side.       Dorsalis pedis pulses are 2+ on the right side, and 2+ on the left side.       Posterior tibial pulses are 2+ on the right side, and 2+ on the left side.  Pulmonary/Chest: Effort normal.  Musculoskeletal: Normal range of motion. She exhibits edema (Moderate nonpitting edema noted to the bilateral lower extremity).  Neurological: She is alert and oriented to person, place, and time.  Skin: Skin is warm and dry. She is not diaphoretic.  Mixture of greater than 1 cm and less than 1 cm varicose veins noted to the bilateral lower extremity.  There is moderate stasis dermatitis.  There is no skin thickening or cellulitis noted.  There are no active ulcerations noted.  Psychiatric: She has a normal mood and affect. Her behavior is normal. Judgment and thought content normal.  Vitals reviewed.  BP 138/72 (BP Location: Left Arm)   Pulse 75   Resp 16   Ht  (1.702 m)   BMI 45.73 kg/m   Past Medical History:  Diagnosis Date  . Anemia    as young child  . Arthritis    back, leg and foot  . Blood transfusion    1964  . Carpal tunnel syndrome   . Diabetes mellitus    oral medications  . Hypertension  Social History   Socioeconomic History  . Marital status: Married    Spouse name: Not on file  . Number of children: Not on file  . Years of education: Not on file  . Highest education level: Not on file  Occupational History  . Not on file  Social Needs  . Financial resource strain: Not on file  . Food insecurity:    Worry: Not on file    Inability: Not on file  . Transportation needs:    Medical: Not on file    Non-medical: Not on file  Tobacco Use  . Smoking status: Never Smoker  . Smokeless tobacco: Never Used  Substance and Sexual Activity  . Alcohol use: No  .  Drug use: No  . Sexual activity: Not on file  Lifestyle  . Physical activity:    Days per week: Not on file    Minutes per session: Not on file  . Stress: Not on file  Relationships  . Social connections:    Talks on phone: Not on file    Gets together: Not on file    Attends religious service: Not on file    Active member of club or organization: Not on file    Attends meetings of clubs or organizations: Not on file    Relationship status: Not on file  . Intimate partner violence:    Fear of current or ex partner: Not on file    Emotionally abused: Not on file    Physically abused: Not on file    Forced sexual activity: Not on file  Other Topics Concern  . Not on file  Social History Narrative  . Not on file   Past Surgical History:  Procedure Laterality Date  . ABDOMINAL HYSTERECTOMY    . ANTERIOR CERVICAL DECOMP/DISCECTOMY FUSION  04/27/2011   Procedure: ANTERIOR CERVICAL DECOMPRESSION/DISCECTOMY FUSION 2 LEVELS;  Surgeon: Mariam Dollar, MD;  Location: [facility];  Service: Neurosurgery;  Laterality: N/A;  Anterior Cervical Decompression/ Discectomy Fusion Cervical Three-Four, Cervical Four-Five  . APPENDECTOMY    . KNEE ARTHROPLASTY     right  . PERIPHERAL VASCULAR CATHETERIZATION Left 10/29/2014   Procedure: Lower Extremity Angiography;  Surgeon: Annice Needy, MD;  Location: [facility];  Service: Cardiovascular;  Laterality: Left;  . PERIPHERAL VASCULAR CATHETERIZATION Left 10/29/2014   Procedure: Lower Extremity Intervention;  Surgeon: Annice Needy, MD;  Location: [facility];  Service: Cardiovascular;  Laterality: Left;  . PERIPHERAL VASCULAR CATHETERIZATION Left 03/11/2015   Procedure: Lower Extremity Angiography;  Surgeon: Annice Needy, MD;  Location: [facility];  Service: Cardiovascular;  Laterality: Left;  . PERIPHERAL VASCULAR CATHETERIZATION Left 03/11/2015   Procedure: Lower Extremity Intervention;  Surgeon: Annice Needy, MD;  Location: [facility];  Service: Cardiovascular;  Laterality: Left;   Family History  Problem Relation Age of Onset  . Cancer Mother   . Hypertension Mother   . Diabetes Paternal Grandmother    Allergies  Allergen Reactions  . Penicillins Shortness Of Breath and Swelling  . Codeine Other (See Comments)    Ringing in ear      Assessment & Plan:  The patient presents for a 19-month chronic venous insufficiency follow-up.  During our last visit a decision to apply for a lymphedema pump was made.  The patient is engaging in conservative therapy including wearing medical grade 1 compression socks, elevating her legs, remaining active and using her lymphedema pump at  least 2-3 times a day for an hour each time.  The patient has not experienced any new ulcer formation however feels that she is still experiencing bilateral lower extremity edema/discomfort from the varicosities noted to her legs.  The patient has been engaging in conservative therapy with the added benefit of a lymphedema pump for over 3 months with minimal improvement to symptoms.  The patient feels like her symptoms have progressed to the point that she is unable to function on a daily basis.  The patient's symptoms are lifestyle limiting.  The patient denies any fever, nausea or vomiting.  1. Chronic venous insufficiency - Stable The patient has been engaging in conservative therapy including wearing medical grade 1 compression socks, elevating her legs, remaining active and using her lymphedema pump at least 2-3 times a day for an hour each time with minimal improvement to the edema and discomfort noted to her bilateral lower extremity. The patient was found to have reflux noted in the bilateral great saphenous veins. The patient is likely to benefit from endovenous laser ablation. I have discussed the risks and benefits of the procedure. The risks primarily include DVT, recanalization, bleeding, infection, and inability to gain access. I  will a applied to the patient's insurance In the meantime, the patient is to continue engaging conservative therapy/her lymphedema pump at least 2-3 times a day for an hour each time.  2. Lymphedema - Stable I will be applying for laser ablation to the bilateral great saphenous veins In the meantime, the patient is to continue engaging conservative therapy/her lymphedema pump at least 2-3 times a day for an hour each time.  3. Skin ulcer of left calf, limited to breakdown of skin (HCC) - Resolved There are currently no active ulcerations noted to the bilateral lower extremity  Current Outpatient Medications on File Prior to Visit  Medication Sig Dispense Refill  . aspirin EC 81 MG tablet Take 81 mg by mouth daily.    . celecoxib (CELEBREX) 200 MG capsule Take 200 mg by mouth daily.    . clopidogrel (PLAVIX) 75 MG tablet Take 1 tablet (75 mg total) by mouth daily. 30 tablet 11  . cyclobenzaprine (FLEXERIL) 10 MG tablet Take 10 mg by mouth 3 (three) times daily as needed. For muscle pain    . furosemide (LASIX) 20 MG tablet Take 20 mg by mouth daily.    Marland Kitchen glipiZIDE (GLUCOTROL XL) 5 MG 24 hr tablet Take 5 mg by mouth 2 (two) times daily.    . Liraglutide (VICTOZA) 18 MG/3ML SOPN Inject into the skin.    Marland Kitchen lisinopril-hydrochlorothiazide (PRINZIDE,ZESTORETIC) 20-25 MG per tablet Take 1 tablet by mouth daily.    . metFORMIN (GLUCOPHAGE) 1000 MG tablet Take 1,000 mg by mouth 2 (two) times daily with a meal.    . metoprolol succinate (TOPROL-XL) 25 MG 24 hr tablet Take 25 mg by mouth daily.    . naproxen (NAPROSYN) 375 MG tablet Take 375 mg by mouth 3 (three) times daily with meals.    Marland Kitchen omeprazole (PRILOSEC) 20 MG capsule Take 20 mg by mouth daily.    . predniSONE (DELTASONE) 10 MG tablet Take 10-40 mg by mouth daily. Reported on 03/11/2015    . pregabalin (LYRICA) 25 MG capsule Take 50 mg by mouth 3 (three) times daily.     . rosuvastatin (CRESTOR) 5 MG tablet Take 5 mg by mouth daily.    .  saxagliptin HCl (ONGLYZA) 5 MG TABS tablet Take 5 mg by mouth  daily.     No current facility-administered medications on file prior to visit.    There are no Patient Instructions on file for this visit. No follow-ups on file.  Melonee Gerstel A Raelynne Ludwick, PA-C

## 2017-07-05 DIAGNOSIS — E1165 Type 2 diabetes mellitus with hyperglycemia: Secondary | ICD-10-CM | POA: Diagnosis not present

## 2017-07-05 DIAGNOSIS — I10 Essential (primary) hypertension: Secondary | ICD-10-CM | POA: Diagnosis not present

## 2017-07-05 DIAGNOSIS — N289 Disorder of kidney and ureter, unspecified: Secondary | ICD-10-CM | POA: Diagnosis not present

## 2017-07-05 DIAGNOSIS — E559 Vitamin D deficiency, unspecified: Secondary | ICD-10-CM | POA: Diagnosis not present

## 2017-07-05 DIAGNOSIS — K219 Gastro-esophageal reflux disease without esophagitis: Secondary | ICD-10-CM | POA: Diagnosis not present

## 2017-07-05 DIAGNOSIS — E785 Hyperlipidemia, unspecified: Secondary | ICD-10-CM | POA: Diagnosis not present

## 2017-07-05 DIAGNOSIS — D509 Iron deficiency anemia, unspecified: Secondary | ICD-10-CM | POA: Diagnosis not present

## 2017-07-16 DIAGNOSIS — R69 Illness, unspecified: Secondary | ICD-10-CM | POA: Diagnosis not present

## 2017-08-02 ENCOUNTER — Ambulatory Visit: Payer: Medicare HMO | Admitting: Podiatry

## 2017-08-27 ENCOUNTER — Other Ambulatory Visit (INDEPENDENT_AMBULATORY_CARE_PROVIDER_SITE_OTHER): Payer: Medicare HMO | Admitting: Vascular Surgery

## 2017-08-30 ENCOUNTER — Encounter (INDEPENDENT_AMBULATORY_CARE_PROVIDER_SITE_OTHER): Payer: Medicare HMO

## 2017-10-12 DIAGNOSIS — R69 Illness, unspecified: Secondary | ICD-10-CM | POA: Diagnosis not present

## 2017-11-11 IMAGING — CR DG CHEST 2V
1 series · 2 of 2 positions shown · non-contrast
Comparison: Chest radiograph August 10, 2013

CLINICAL DATA: Cough, cold symptoms, nausea and abdominal pain for
week. History of hypertension diabetes.

EXAM:
CHEST  2 VIEW

[Series 1: dg chest 2 view · 0.14mm/px · 2 of 2 slices shown]
[im 1/2]
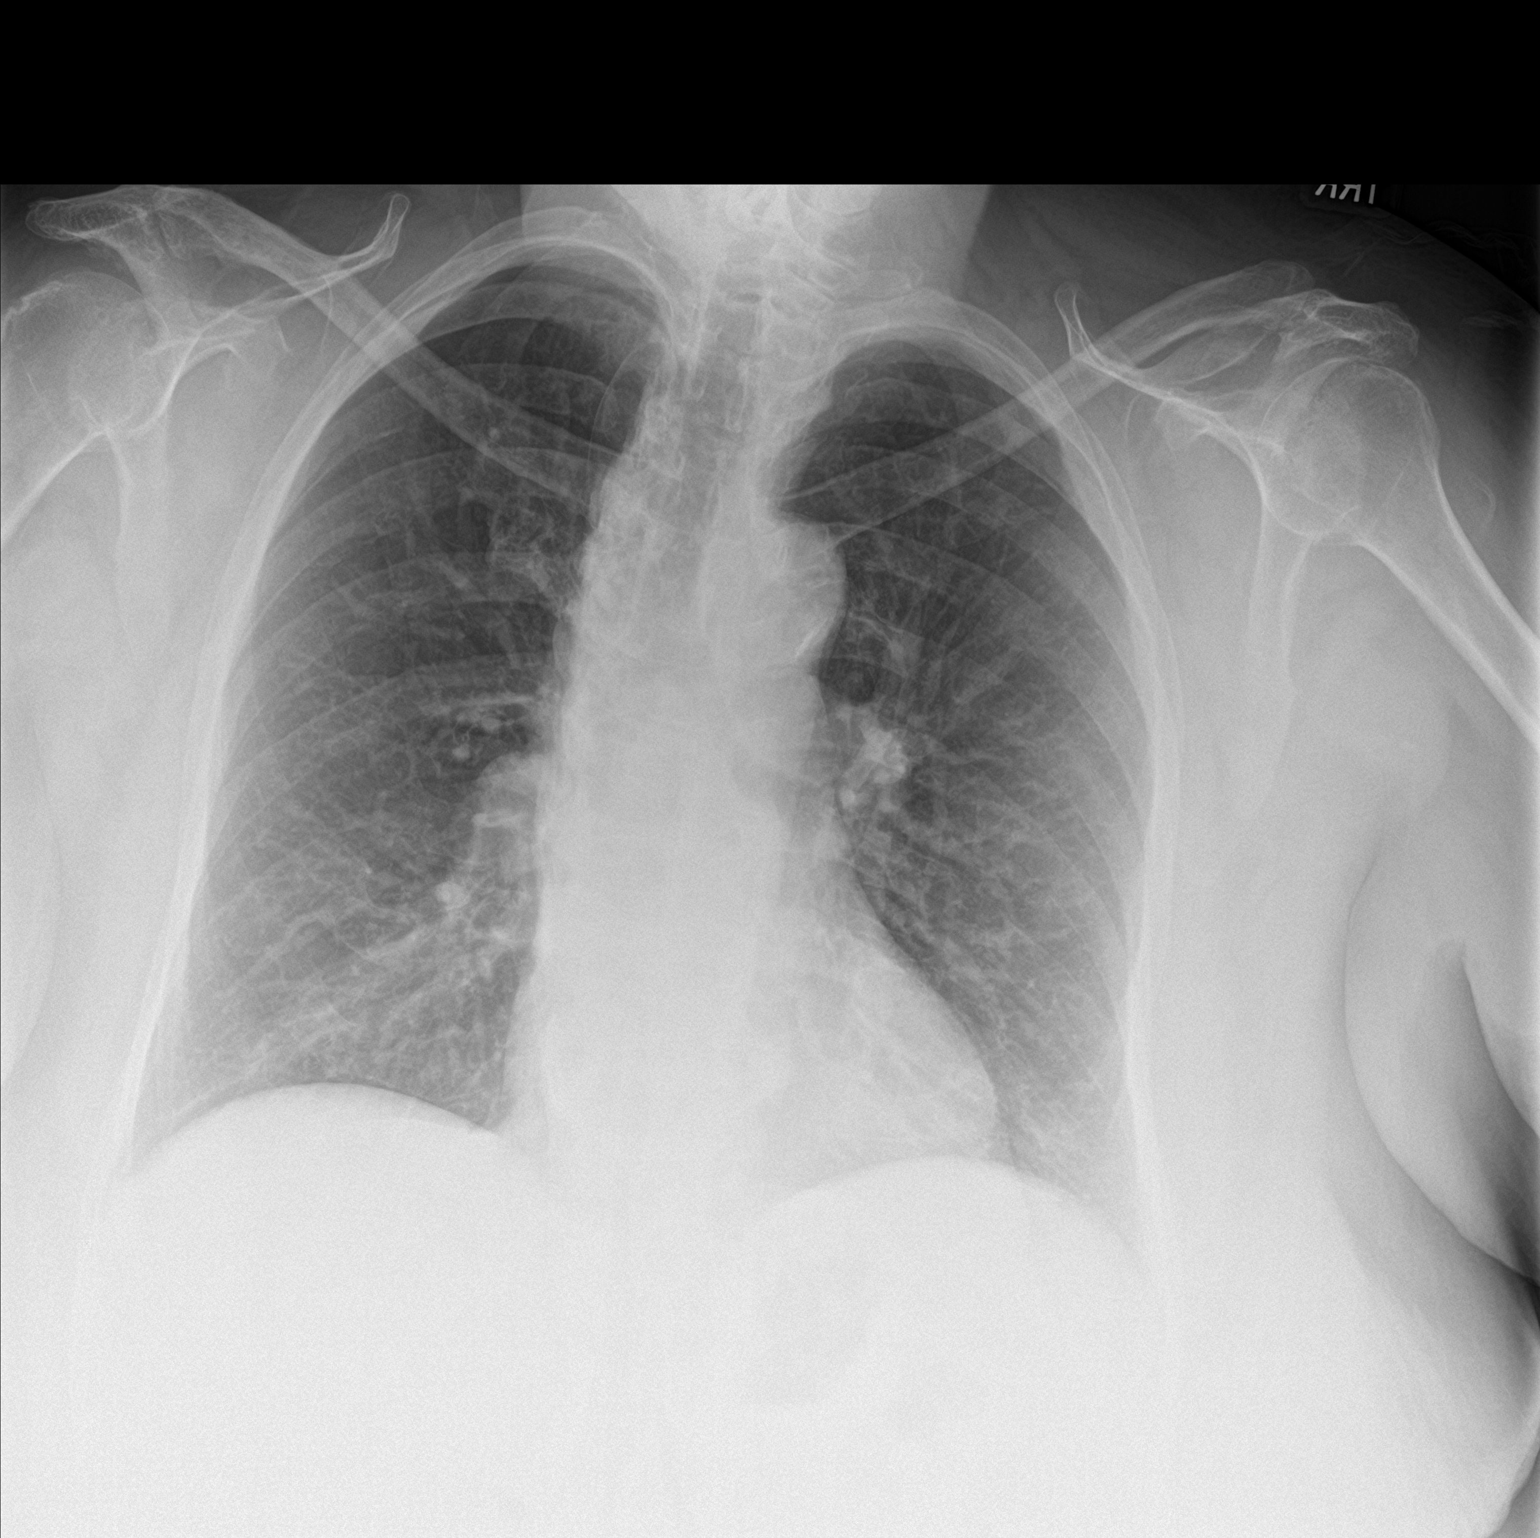
[im 2/2]
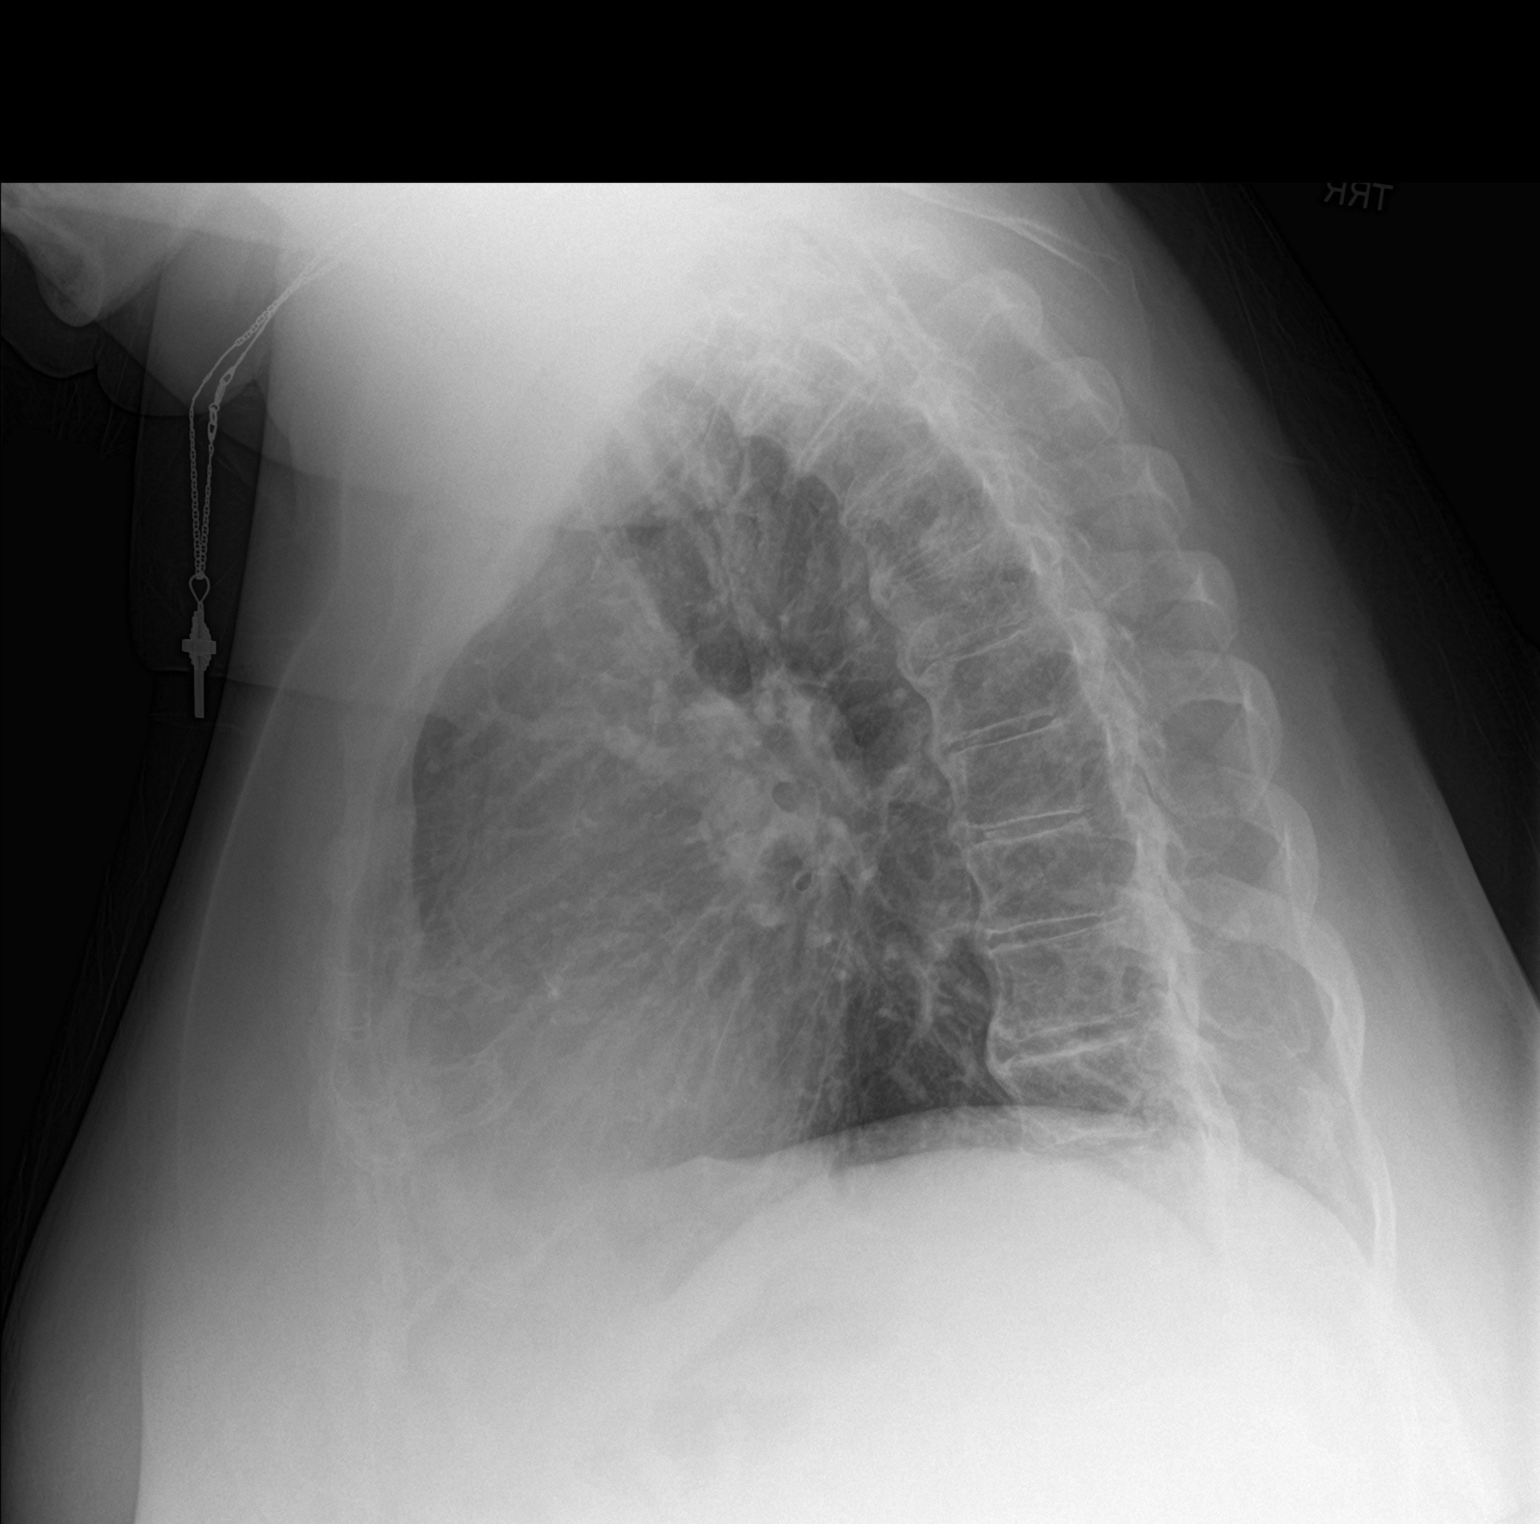

[2 of 2 positions shown; findings below may reference images not displayed]

FINDINGS: Cardiomediastinal silhouette is normal. Mild calcific
atherosclerosis of the aortic arch. Suspected calcified LEFT hilar
lymph nodes. No pleural effusions or focal consolidations. Trachea
projects midline and there is no pneumothorax. Soft tissue planes
and included osseous structures are non-suspicious. Moderate
degenerative change of the thoracic spine. ACDF.
IMPRESSION: Stable examination:  No acute cardiopulmonary process.

## 2017-11-30 ENCOUNTER — Ambulatory Visit (INDEPENDENT_AMBULATORY_CARE_PROVIDER_SITE_OTHER): Payer: Medicare HMO | Admitting: Vascular Surgery

## 2017-11-30 ENCOUNTER — Encounter (INDEPENDENT_AMBULATORY_CARE_PROVIDER_SITE_OTHER): Payer: Self-pay | Admitting: Vascular Surgery

## 2017-11-30 VITALS — BP 170/71 | HR 77 | Resp 16

## 2017-11-30 DIAGNOSIS — I83893 Varicose veins of bilateral lower extremities with other complications: Secondary | ICD-10-CM | POA: Diagnosis not present

## 2017-11-30 DIAGNOSIS — I872 Venous insufficiency (chronic) (peripheral): Secondary | ICD-10-CM

## 2017-11-30 NOTE — Progress Notes (Addendum)
Chronic venous insufficiency    Varicose veins of bilateral lower extremities with other complications     The patient's left lower extremity was sterilely prepped and draped. The ultrasound machine was used to visualize the saphenous vein throughout its course. A segment at the level of the knee was selected for access. The saphenous vein was accessed without difficulty using ultrasound guidance with a micro puncture needle. A micro puncture wire and sheath were then placed. A 0.018 wire was placed beyond the saphenofemoral junction through the sheath and the micro puncture sheath was removed. The 65 cm sheath was then placed over the wire and the wire and dilator were removed. The laser fiber was placed through the sheath and its tip was placed approximately 4-5 cm below the saphenofemoral junction. Tumescent anesthesia was then created with a dilute lidocaine solution. Laser energy was then delivered with constant withdrawal of the sheath and laser fiber. Approximately 1188 Joules of energy were delivered over a length of 28 cm using the 1470 Hz Vena[employer]ure machine at [employer]. Sterile dressings were placed. The patient tolerated the procedure well without complications.

## 2017-12-03 ENCOUNTER — Ambulatory Visit (INDEPENDENT_AMBULATORY_CARE_PROVIDER_SITE_OTHER): Payer: Medicare HMO

## 2017-12-03 DIAGNOSIS — I872 Venous insufficiency (chronic) (peripheral): Secondary | ICD-10-CM

## 2017-12-08 DIAGNOSIS — R69 Illness, unspecified: Secondary | ICD-10-CM | POA: Diagnosis not present

## 2017-12-16 DIAGNOSIS — I10 Essential (primary) hypertension: Secondary | ICD-10-CM | POA: Diagnosis not present

## 2017-12-16 DIAGNOSIS — E1165 Type 2 diabetes mellitus with hyperglycemia: Secondary | ICD-10-CM | POA: Diagnosis not present

## 2017-12-16 DIAGNOSIS — E1140 Type 2 diabetes mellitus with diabetic neuropathy, unspecified: Secondary | ICD-10-CM | POA: Diagnosis not present

## 2017-12-16 DIAGNOSIS — I739 Peripheral vascular disease, unspecified: Secondary | ICD-10-CM | POA: Diagnosis not present

## 2017-12-16 DIAGNOSIS — S50901A Unspecified superficial injury of right elbow, initial encounter: Secondary | ICD-10-CM | POA: Diagnosis not present

## 2017-12-16 DIAGNOSIS — N289 Disorder of kidney and ureter, unspecified: Secondary | ICD-10-CM | POA: Diagnosis not present

## 2017-12-16 DIAGNOSIS — E785 Hyperlipidemia, unspecified: Secondary | ICD-10-CM | POA: Diagnosis not present

## 2017-12-16 DIAGNOSIS — E669 Obesity, unspecified: Secondary | ICD-10-CM | POA: Diagnosis not present

## 2017-12-16 DIAGNOSIS — E559 Vitamin D deficiency, unspecified: Secondary | ICD-10-CM | POA: Diagnosis not present

## 2017-12-28 ENCOUNTER — Ambulatory Visit (INDEPENDENT_AMBULATORY_CARE_PROVIDER_SITE_OTHER): Payer: Medicare HMO | Admitting: Vascular Surgery

## 2017-12-28 ENCOUNTER — Encounter (INDEPENDENT_AMBULATORY_CARE_PROVIDER_SITE_OTHER): Payer: Self-pay | Admitting: Vascular Surgery

## 2017-12-28 VITALS — BP 153/73 | HR 84 | Resp 16

## 2017-12-28 DIAGNOSIS — I83893 Varicose veins of bilateral lower extremities with other complications: Secondary | ICD-10-CM

## 2017-12-28 DIAGNOSIS — I872 Venous insufficiency (chronic) (peripheral): Secondary | ICD-10-CM | POA: Diagnosis not present

## 2017-12-28 NOTE — Progress Notes (Addendum)
Chronic venous insufficiency    Varicose veins of bilateral lower extremities with other complications     The patient's right lower extremity was sterilely prepped and draped. The ultrasound machine was used to visualize the saphenous vein throughout its course. A segment just below the knee was selected for access. The saphenous vein was accessed without difficulty using ultrasound guidance with a micro puncture needle. A micro puncture wire and sheath were then placed. A 0.018 wire was placed beyond the saphenofemoral junction through the sheath and the micro puncture sheath was removed. The 65 cm sheath was then placed over the wire and the wire and dilator were removed. The laser fiber was placed through the sheath and its tip was placed approximately 4-5 cm below the saphenofemoral junction. Tumescent anesthesia was then created with a dilute lidocaine solution. Laser energy was then delivered with constant withdrawal of the sheath and laser fiber. Approximately 1233 Joules of energy were delivered over a length of 31 cm using the 1470 Hz VenaCure machine at [employer]. Sterile dressings were placed. The patient tolerated the procedure well without complications.

## 2017-12-30 ENCOUNTER — Ambulatory Visit (INDEPENDENT_AMBULATORY_CARE_PROVIDER_SITE_OTHER): Payer: Medicare HMO

## 2017-12-30 DIAGNOSIS — I872 Venous insufficiency (chronic) (peripheral): Secondary | ICD-10-CM | POA: Diagnosis not present

## 2017-12-31 ENCOUNTER — Encounter (INDEPENDENT_AMBULATORY_CARE_PROVIDER_SITE_OTHER): Payer: Medicare HMO

## 2018-01-03 DIAGNOSIS — R69 Illness, unspecified: Secondary | ICD-10-CM | POA: Diagnosis not present

## 2018-01-28 DIAGNOSIS — I83893 Varicose veins of bilateral lower extremities with other complications: Secondary | ICD-10-CM | POA: Insufficient documentation

## 2018-02-18 DIAGNOSIS — R69 Illness, unspecified: Secondary | ICD-10-CM | POA: Diagnosis not present

## 2018-03-16 DIAGNOSIS — H43811 Vitreous degeneration, right eye: Secondary | ICD-10-CM | POA: Diagnosis not present

## 2018-03-16 DIAGNOSIS — E113392 Type 2 diabetes mellitus with moderate nonproliferative diabetic retinopathy without macular edema, left eye: Secondary | ICD-10-CM | POA: Diagnosis not present

## 2018-03-16 DIAGNOSIS — E113311 Type 2 diabetes mellitus with moderate nonproliferative diabetic retinopathy with macular edema, right eye: Secondary | ICD-10-CM | POA: Diagnosis not present

## 2018-03-25 DIAGNOSIS — E1151 Type 2 diabetes mellitus with diabetic peripheral angiopathy without gangrene: Secondary | ICD-10-CM | POA: Diagnosis not present

## 2018-03-25 DIAGNOSIS — H2511 Age-related nuclear cataract, right eye: Secondary | ICD-10-CM | POA: Diagnosis not present

## 2018-03-25 DIAGNOSIS — E1142 Type 2 diabetes mellitus with diabetic polyneuropathy: Secondary | ICD-10-CM | POA: Diagnosis not present

## 2018-03-25 DIAGNOSIS — Z6841 Body Mass Index (BMI) 40.0 and over, adult: Secondary | ICD-10-CM | POA: Diagnosis not present

## 2018-03-25 DIAGNOSIS — E1136 Type 2 diabetes mellitus with diabetic cataract: Secondary | ICD-10-CM | POA: Diagnosis not present

## 2018-03-25 DIAGNOSIS — E113492 Type 2 diabetes mellitus with severe nonproliferative diabetic retinopathy without macular edema, left eye: Secondary | ICD-10-CM | POA: Diagnosis not present

## 2018-03-25 DIAGNOSIS — H2513 Age-related nuclear cataract, bilateral: Secondary | ICD-10-CM | POA: Diagnosis not present

## 2018-03-25 DIAGNOSIS — I70209 Unspecified atherosclerosis of native arteries of extremities, unspecified extremity: Secondary | ICD-10-CM | POA: Diagnosis not present

## 2018-03-25 DIAGNOSIS — E113411 Type 2 diabetes mellitus with severe nonproliferative diabetic retinopathy with macular edema, right eye: Secondary | ICD-10-CM | POA: Diagnosis not present

## 2018-03-25 DIAGNOSIS — E1159 Type 2 diabetes mellitus with other circulatory complications: Secondary | ICD-10-CM | POA: Diagnosis not present

## 2018-03-25 DIAGNOSIS — I509 Heart failure, unspecified: Secondary | ICD-10-CM | POA: Diagnosis not present

## 2018-03-25 DIAGNOSIS — E113519 Type 2 diabetes mellitus with proliferative diabetic retinopathy with macular edema, unspecified eye: Secondary | ICD-10-CM | POA: Diagnosis not present

## 2018-03-25 DIAGNOSIS — I110 Hypertensive heart disease with heart failure: Secondary | ICD-10-CM | POA: Diagnosis not present

## 2018-03-30 DIAGNOSIS — E559 Vitamin D deficiency, unspecified: Secondary | ICD-10-CM | POA: Diagnosis not present

## 2018-03-30 DIAGNOSIS — I10 Essential (primary) hypertension: Secondary | ICD-10-CM | POA: Diagnosis not present

## 2018-03-30 DIAGNOSIS — E1165 Type 2 diabetes mellitus with hyperglycemia: Secondary | ICD-10-CM | POA: Diagnosis not present

## 2018-03-30 DIAGNOSIS — R079 Chest pain, unspecified: Secondary | ICD-10-CM | POA: Diagnosis not present

## 2018-03-30 DIAGNOSIS — E669 Obesity, unspecified: Secondary | ICD-10-CM | POA: Diagnosis not present

## 2018-03-30 DIAGNOSIS — N289 Disorder of kidney and ureter, unspecified: Secondary | ICD-10-CM | POA: Diagnosis not present

## 2018-03-30 DIAGNOSIS — E785 Hyperlipidemia, unspecified: Secondary | ICD-10-CM | POA: Diagnosis not present

## 2018-03-31 DIAGNOSIS — R69 Illness, unspecified: Secondary | ICD-10-CM | POA: Diagnosis not present

## 2018-04-04 DIAGNOSIS — R079 Chest pain, unspecified: Secondary | ICD-10-CM | POA: Diagnosis not present

## 2018-04-05 DIAGNOSIS — E785 Hyperlipidemia, unspecified: Secondary | ICD-10-CM | POA: Diagnosis not present

## 2018-04-05 DIAGNOSIS — R079 Chest pain, unspecified: Secondary | ICD-10-CM | POA: Diagnosis not present

## 2018-04-05 DIAGNOSIS — I10 Essential (primary) hypertension: Secondary | ICD-10-CM | POA: Diagnosis not present

## 2018-04-05 DIAGNOSIS — E1165 Type 2 diabetes mellitus with hyperglycemia: Secondary | ICD-10-CM | POA: Diagnosis not present

## 2018-04-05 DIAGNOSIS — E559 Vitamin D deficiency, unspecified: Secondary | ICD-10-CM | POA: Diagnosis not present

## 2018-04-05 DIAGNOSIS — N289 Disorder of kidney and ureter, unspecified: Secondary | ICD-10-CM | POA: Diagnosis not present

## 2018-04-14 DIAGNOSIS — H2511 Age-related nuclear cataract, right eye: Secondary | ICD-10-CM | POA: Diagnosis not present

## 2018-04-15 DIAGNOSIS — H2512 Age-related nuclear cataract, left eye: Secondary | ICD-10-CM | POA: Diagnosis not present

## 2018-04-20 DIAGNOSIS — E113311 Type 2 diabetes mellitus with moderate nonproliferative diabetic retinopathy with macular edema, right eye: Secondary | ICD-10-CM | POA: Diagnosis not present

## 2018-04-20 DIAGNOSIS — E113392 Type 2 diabetes mellitus with moderate nonproliferative diabetic retinopathy without macular edema, left eye: Secondary | ICD-10-CM | POA: Diagnosis not present

## 2018-04-26 ENCOUNTER — Other Ambulatory Visit: Payer: Self-pay | Admitting: Internal Medicine

## 2018-04-26 DIAGNOSIS — R079 Chest pain, unspecified: Secondary | ICD-10-CM

## 2018-04-28 DIAGNOSIS — H2512 Age-related nuclear cataract, left eye: Secondary | ICD-10-CM | POA: Diagnosis not present

## 2018-05-03 ENCOUNTER — Other Ambulatory Visit: Payer: Self-pay

## 2018-06-08 DIAGNOSIS — R69 Illness, unspecified: Secondary | ICD-10-CM | POA: Diagnosis not present

## 2018-06-21 DIAGNOSIS — R69 Illness, unspecified: Secondary | ICD-10-CM | POA: Diagnosis not present

## 2018-06-30 DIAGNOSIS — N289 Disorder of kidney and ureter, unspecified: Secondary | ICD-10-CM | POA: Diagnosis not present

## 2018-06-30 DIAGNOSIS — I10 Essential (primary) hypertension: Secondary | ICD-10-CM | POA: Diagnosis not present

## 2018-06-30 DIAGNOSIS — M129 Arthropathy, unspecified: Secondary | ICD-10-CM | POA: Diagnosis not present

## 2018-06-30 DIAGNOSIS — E785 Hyperlipidemia, unspecified: Secondary | ICD-10-CM | POA: Diagnosis not present

## 2018-06-30 DIAGNOSIS — E1165 Type 2 diabetes mellitus with hyperglycemia: Secondary | ICD-10-CM | POA: Diagnosis not present

## 2018-06-30 DIAGNOSIS — R5383 Other fatigue: Secondary | ICD-10-CM | POA: Diagnosis not present

## 2018-07-05 DIAGNOSIS — R69 Illness, unspecified: Secondary | ICD-10-CM | POA: Diagnosis not present

## 2018-07-20 DIAGNOSIS — E113311 Type 2 diabetes mellitus with moderate nonproliferative diabetic retinopathy with macular edema, right eye: Secondary | ICD-10-CM | POA: Diagnosis not present

## 2018-07-20 DIAGNOSIS — E113392 Type 2 diabetes mellitus with moderate nonproliferative diabetic retinopathy without macular edema, left eye: Secondary | ICD-10-CM | POA: Diagnosis not present

## 2018-07-20 DIAGNOSIS — H43811 Vitreous degeneration, right eye: Secondary | ICD-10-CM | POA: Diagnosis not present

## 2018-07-29 DIAGNOSIS — R69 Illness, unspecified: Secondary | ICD-10-CM | POA: Diagnosis not present

## 2018-08-10 DIAGNOSIS — M25562 Pain in left knee: Secondary | ICD-10-CM | POA: Diagnosis not present

## 2018-08-10 DIAGNOSIS — E1165 Type 2 diabetes mellitus with hyperglycemia: Secondary | ICD-10-CM | POA: Diagnosis not present

## 2018-09-05 DIAGNOSIS — R69 Illness, unspecified: Secondary | ICD-10-CM | POA: Diagnosis not present

## 2018-09-15 DIAGNOSIS — R69 Illness, unspecified: Secondary | ICD-10-CM | POA: Diagnosis not present

## 2018-09-22 DIAGNOSIS — E785 Hyperlipidemia, unspecified: Secondary | ICD-10-CM | POA: Diagnosis not present

## 2018-09-22 DIAGNOSIS — N289 Disorder of kidney and ureter, unspecified: Secondary | ICD-10-CM | POA: Diagnosis not present

## 2018-09-22 DIAGNOSIS — E1165 Type 2 diabetes mellitus with hyperglycemia: Secondary | ICD-10-CM | POA: Diagnosis not present

## 2018-09-22 DIAGNOSIS — E669 Obesity, unspecified: Secondary | ICD-10-CM | POA: Diagnosis not present

## 2018-09-22 DIAGNOSIS — E559 Vitamin D deficiency, unspecified: Secondary | ICD-10-CM | POA: Diagnosis not present

## 2018-09-22 DIAGNOSIS — Z0000 Encounter for general adult medical examination without abnormal findings: Secondary | ICD-10-CM | POA: Diagnosis not present

## 2018-09-22 DIAGNOSIS — D509 Iron deficiency anemia, unspecified: Secondary | ICD-10-CM | POA: Diagnosis not present

## 2018-09-24 DIAGNOSIS — R69 Illness, unspecified: Secondary | ICD-10-CM | POA: Diagnosis not present

## 2018-11-03 DIAGNOSIS — M25562 Pain in left knee: Secondary | ICD-10-CM | POA: Diagnosis not present

## 2018-11-03 DIAGNOSIS — M1712 Unilateral primary osteoarthritis, left knee: Secondary | ICD-10-CM | POA: Diagnosis not present

## 2018-11-03 DIAGNOSIS — G8929 Other chronic pain: Secondary | ICD-10-CM | POA: Diagnosis not present

## 2018-11-11 DIAGNOSIS — Z96651 Presence of right artificial knee joint: Secondary | ICD-10-CM | POA: Insufficient documentation

## 2018-11-11 DIAGNOSIS — I739 Peripheral vascular disease, unspecified: Secondary | ICD-10-CM | POA: Insufficient documentation

## 2018-12-20 DIAGNOSIS — N289 Disorder of kidney and ureter, unspecified: Secondary | ICD-10-CM | POA: Diagnosis not present

## 2018-12-20 DIAGNOSIS — K219 Gastro-esophageal reflux disease without esophagitis: Secondary | ICD-10-CM | POA: Diagnosis not present

## 2018-12-20 DIAGNOSIS — E669 Obesity, unspecified: Secondary | ICD-10-CM | POA: Diagnosis not present

## 2018-12-20 DIAGNOSIS — E559 Vitamin D deficiency, unspecified: Secondary | ICD-10-CM | POA: Diagnosis not present

## 2018-12-20 DIAGNOSIS — E785 Hyperlipidemia, unspecified: Secondary | ICD-10-CM | POA: Diagnosis not present

## 2018-12-20 DIAGNOSIS — I10 Essential (primary) hypertension: Secondary | ICD-10-CM | POA: Diagnosis not present

## 2018-12-20 DIAGNOSIS — D509 Iron deficiency anemia, unspecified: Secondary | ICD-10-CM | POA: Diagnosis not present

## 2018-12-20 DIAGNOSIS — E1165 Type 2 diabetes mellitus with hyperglycemia: Secondary | ICD-10-CM | POA: Diagnosis not present

## 2019-01-10 DIAGNOSIS — E785 Hyperlipidemia, unspecified: Secondary | ICD-10-CM | POA: Diagnosis not present

## 2019-01-10 DIAGNOSIS — N289 Disorder of kidney and ureter, unspecified: Secondary | ICD-10-CM | POA: Diagnosis not present

## 2019-01-10 DIAGNOSIS — K219 Gastro-esophageal reflux disease without esophagitis: Secondary | ICD-10-CM | POA: Diagnosis not present

## 2019-01-10 DIAGNOSIS — E1165 Type 2 diabetes mellitus with hyperglycemia: Secondary | ICD-10-CM | POA: Diagnosis not present

## 2019-01-10 DIAGNOSIS — M1712 Unilateral primary osteoarthritis, left knee: Secondary | ICD-10-CM | POA: Diagnosis not present

## 2019-01-10 DIAGNOSIS — D509 Iron deficiency anemia, unspecified: Secondary | ICD-10-CM | POA: Diagnosis not present

## 2019-01-10 DIAGNOSIS — E669 Obesity, unspecified: Secondary | ICD-10-CM | POA: Diagnosis not present

## 2019-01-10 DIAGNOSIS — E559 Vitamin D deficiency, unspecified: Secondary | ICD-10-CM | POA: Diagnosis not present

## 2019-01-10 DIAGNOSIS — M25562 Pain in left knee: Secondary | ICD-10-CM | POA: Diagnosis not present

## 2019-01-10 DIAGNOSIS — I10 Essential (primary) hypertension: Secondary | ICD-10-CM | POA: Diagnosis not present

## 2019-01-10 DIAGNOSIS — G8929 Other chronic pain: Secondary | ICD-10-CM | POA: Diagnosis not present

## 2019-03-15 ENCOUNTER — Ambulatory Visit: Payer: Medicare HMO | Attending: Family

## 2019-03-15 ENCOUNTER — Other Ambulatory Visit: Payer: Self-pay

## 2019-03-15 DIAGNOSIS — Z20822 Contact with and (suspected) exposure to covid-19: Secondary | ICD-10-CM

## 2019-03-16 LAB — NOVEL CORONAVIRUS, NAA: SARS-CoV-2, NAA: DETECTED — AB

## 2019-03-17 ENCOUNTER — Telehealth: Payer: Self-pay | Admitting: Nurse Practitioner

## 2019-03-17 ENCOUNTER — Telehealth: Payer: Self-pay

## 2019-03-17 ENCOUNTER — Other Ambulatory Visit: Payer: Self-pay | Admitting: Physician Assistant

## 2019-03-17 DIAGNOSIS — U071 COVID-19: Secondary | ICD-10-CM

## 2019-03-17 DIAGNOSIS — I10 Essential (primary) hypertension: Secondary | ICD-10-CM

## 2019-03-17 NOTE — Telephone Encounter (Signed)
Called to Discuss with patient about Covid symptoms and the use of bamlanivimab, a monoclonal antibody infusion for those with mild to moderate Covid symptoms and at a high risk of hospitalization.     Pt is qualified for this infusion at the [facility] infusion center due to co-morbid conditions and/or a member of an at-risk group.     Patient Active Problem List   Diagnosis Date Noted  . Varicose veins of bilateral lower extremities with other complications 01/28/2018  . Lymphedema 03/24/2017  . Skin ulcer of left calf, limited to breakdown of skin (HCC) 02/17/2017  . Essential hypertension, benign 08/25/2016  . Bilateral lower extremity edema 07/22/2016    Patient declines infusion at this time. Symptoms tier reviewed as well as criteria for ending isolation. Preventative practices reviewed. Patient verbalized understanding.    Patient advised to call back if he decides that he does want to get infusion. Callback number to the infusion center given. Patient advised to go to Urgent care or ED with severe symptoms.

## 2019-03-17 NOTE — Telephone Encounter (Signed)
Pt daughter notified of positive COVID-19 test results. Pt daughter verbalized understanding. Pt daughter reports that patient has nausea.Pt advised to remain in self quarantine until at least 10 days since symptom onset And 3 consecutive days fever free without antipyretics And improvement in respiratory symptoms. Patient advised to utilize over the counter medications to treat symptoms. Pt advised to seek treatment in the ED if respiratory issues/distress develops.Pt advised they should only leave home to seek and medical care and must wear a mask in public. Pt instructed to limit contact with family members or caregivers in the home. Pt advised to practice social distancing and to continue to use good preventative care measures such has frequent hand washing, staying out of crowds and cleaning hard surfaces frequently touched in the home.Pt informed that the health department will likely follow up and may have additional recommendations. Will notify Health Department.

## 2019-03-17 NOTE — Progress Notes (Signed)
  I connected by phone with [name] on 03/17/2019 at 2:22 PM to discuss the potential use of an new treatment for mild to moderate COVID-19 viral infection in non-hospitalized patients.  This patient is a 74 y.o. female that meets the FDA criteria for Emergency Use Authorization of bamlanivimab or casirivimab\imdevimab.  Has a (+) direct SARS-CoV-2 viral test result  Has mild or moderate COVID-19   Is ? 75 years of age and weighs ? 40 kg  Is NOT hospitalized due to COVID-19  Is NOT requiring oxygen therapy or requiring an increase in baseline oxygen flow rate due to COVID-19  Is within 10 days of symptom onset  Has at least one of the high risk factor(s) for progression to severe COVID-19 and/or hospitalization as defined in EUA.  Specific high risk criteria : >/= 75 yo   Hx of HTN   I have spoken and communicated the following to the patient or parent/caregiver:  1. FDA has authorized the emergency use of bamlanivimab and casirivimab\imdevimab for the treatment of mild to moderate COVID-19 in adults and pediatric patients with positive results of direct SARS-CoV-2 viral testing who are 31 years of age and older weighing at least 40 kg, and who are at high risk for progressing to severe COVID-19 and/or hospitalization.  2. The significant known and potential risks and benefits of bamlanivimab and casirivimab\imdevimab, and the extent to which such potential risks and benefits are unknown.  3. Information on available alternative treatments and the risks and benefits of those alternatives, including clinical trials.  4. Patients treated with bamlanivimab and casirivimab\imdevimab should continue to self-isolate and use infection control measures (e.g., wear mask, isolate, social distance, avoid sharing personal items, clean and disinfect "high touch" surfaces, and frequent handwashing) according to CDC guidelines.   5. The patient or parent/caregiver has the option to accept or  refuse bamlanivimab or casirivimab\imdevimab .  After reviewing this information with the patient, The patient agreed to proceed with receiving the bamlanimivab infusion and will be provided a copy of the Fact sheet prior to receiving the infusion..  Direction, call back # and side effects reviewed with son.   Kirk Sampley 03/17/2019 2:22 PM

## 2019-03-19 ENCOUNTER — Ambulatory Visit (HOSPITAL_COMMUNITY)
Admission: RE | Admit: 2019-03-19 | Discharge: 2019-03-19 | Disposition: A | Discharge status: Home / Self Care | Payer: Medicare Other | Source: Ambulatory Visit | Attending: Pulmonary Disease | Admitting: Pulmonary Disease

## 2019-03-19 DIAGNOSIS — Z23 Encounter for immunization: Secondary | ICD-10-CM | POA: Diagnosis not present

## 2019-03-19 DIAGNOSIS — U071 COVID-19: Secondary | ICD-10-CM

## 2019-03-19 MED ORDER — METHYLPREDNISOLONE SODIUM SUCC 125 MG IJ SOLR: 125.0000 mg | Freq: Once | INTRAMUSCULAR | Status: DC | PRN

## 2019-03-19 MED ORDER — SODIUM CHLORIDE 0.9 % IV SOLN
700.0000 mg | Freq: Once | INTRAVENOUS | Status: AC
  Administered 2019-03-19: 09:00:00 700 mg via INTRAVENOUS
  Filled 2019-03-19: qty 20

## 2019-03-19 MED ORDER — FAMOTIDINE IN NACL 20-0.9 MG/50ML-% IV SOLN: 20.0000 mg | Freq: Once | INTRAVENOUS | Status: DC | PRN

## 2019-03-19 MED ORDER — DIPHENHYDRAMINE HCL 50 MG/ML IJ SOLN: 50.0000 mg | Freq: Once | INTRAMUSCULAR | Status: DC | PRN

## 2019-03-19 MED ORDER — EPINEPHRINE 0.3 MG/0.3ML IJ SOAJ: 0.3000 mg | Freq: Once | INTRAMUSCULAR | Status: DC | PRN

## 2019-03-19 MED ORDER — ALBUTEROL SULFATE HFA 108 (90 BASE) MCG/ACT IN AERS: 2.0000 | INHALATION_SPRAY | Freq: Once | RESPIRATORY_TRACT | Status: DC | PRN

## 2019-03-19 MED ORDER — SODIUM CHLORIDE 0.9 % IV SOLN
INTRAVENOUS | Status: DC | PRN
  Administered 2019-03-19: 250 mL via INTRAVENOUS

## 2019-03-19 NOTE — Discharge Instructions (Signed)
COVID-19 COVID-19 is a respiratory infection that is caused by a virus called severe acute respiratory syndrome coronavirus 2 (SARS-CoV-2). The disease is also known as coronavirus disease or novel coronavirus. In some people, the virus may not cause any symptoms. In others, it may cause a serious infection. The infection can get worse quickly and can lead to complications, such as:  Pneumonia, or infection of the lungs.  Acute respiratory distress syndrome or ARDS. This is a condition in which fluid build-up in the lungs prevents the lungs from filling with air and passing oxygen into the blood.  Acute respiratory failure. This is a condition in which there is not enough oxygen passing from the lungs to the body or when carbon dioxide is not passing from the lungs out of the body.  Sepsis or septic shock. This is a serious bodily reaction to an infection.  Blood clotting problems.  Secondary infections due to bacteria or fungus.  Organ failure. This is when your body's organs stop working. The virus that causes COVID-19 is contagious. This means that it can spread from person to person through droplets from coughs and sneezes (respiratory secretions). What are the causes? This illness is caused by a virus. You may catch the virus by:  Breathing in droplets from an infected person. Droplets can be spread by a person breathing, speaking, singing, coughing, or sneezing.  Touching something, like a table or a doorknob, that was exposed to the virus (contaminated) and then touching your mouth, nose, or eyes. What increases the risk? Risk for infection You are more likely to be infected with this virus if you:  Are within 6 feet (2 meters) of a person with COVID-19.  Provide care for or live with a person who is infected with COVID-19.  Spend time in crowded indoor spaces or live in shared housing. Risk for serious illness You are more likely to become seriously ill from the virus if  you:  Are 50 years of age or older. The higher your age, the more you are at risk for serious illness.  Live in a nursing home or long-term care facility.  Have cancer.  Have a long-term (chronic) disease such as: ? Chronic lung disease, including chronic obstructive pulmonary disease or asthma. ? A long-term disease that lowers your body's ability to fight infection (immunocompromised). ? Heart disease, including heart failure, a condition in which the arteries that lead to the heart become narrow or blocked (coronary artery disease), a disease which makes the heart muscle thick, weak, or stiff (cardiomyopathy). ? Diabetes. ? Chronic kidney disease. ? Sickle cell disease, a condition in which red blood cells have an abnormal "sickle" shape. ? Liver disease.  Are obese. What are the signs or symptoms? Symptoms of this condition can range from mild to severe. Symptoms may appear any time from 2 to 14 days after being exposed to the virus. They include:  A fever or chills.  A cough.  Difficulty breathing.  Headaches, body aches, or muscle aches.  Runny or stuffy (congested) nose.  A sore throat.  New loss of taste or smell. Some people may also have stomach problems, such as nausea, vomiting, or diarrhea. Other people may not have any symptoms of COVID-19. How is this diagnosed? This condition may be diagnosed based on:  Your signs and symptoms, especially if: ? You live in an area with a COVID-19 outbreak. ? You recently traveled to or from an area where the virus is common. ? You   provide care for or live with a person who was diagnosed with COVID-19. ? You were exposed to a person who was diagnosed with COVID-19.  A physical exam.  Lab tests, which may include: ? Taking a sample of fluid from the back of your nose and throat (nasopharyngeal fluid), your nose, or your throat using a swab. ? A sample of mucus from your lungs (sputum). ? Blood tests.  Imaging tests,  which may include, X-rays, CT scan, or ultrasound. How is this treated? At present, there is no medicine to treat COVID-19. Medicines that treat other diseases are being used on a trial basis to see if they are effective against COVID-19. Your health care provider will talk with you about ways to treat your symptoms. For most people, the infection is mild and can be managed at home with rest, fluids, and over-the-counter medicines. Treatment for a serious infection usually takes places in a hospital intensive care unit (ICU). It may include one or more of the following treatments. These treatments are given until your symptoms improve.  Receiving fluids and medicines through an IV.  Supplemental oxygen. Extra oxygen is given through a tube in the nose, a face mask, or a hood.  Positioning you to lie on your stomach (prone position). This makes it easier for oxygen to get into the lungs.  Continuous positive airway pressure (CPAP) or bi-level positive airway pressure (BPAP) machine. This treatment uses mild air pressure to keep the airways open. A tube that is connected to a motor delivers oxygen to the body.  Ventilator. This treatment moves air into and out of the lungs by using a tube that is placed in your windpipe.  Tracheostomy. This is a procedure to create a hole in the neck so that a breathing tube can be inserted.  Extracorporeal membrane oxygenation (ECMO). This procedure gives the lungs a chance to recover by taking over the functions of the heart and lungs. It supplies oxygen to the body and removes carbon dioxide. Follow these instructions at home: Lifestyle  If you are sick, stay home except to get medical care. Your health care provider will tell you how long to stay home. Call your health care provider before you go for medical care.  Rest at home as told by your health care provider.  Do not use any products that contain nicotine or tobacco, such as cigarettes,  e-cigarettes, and chewing tobacco. If you need help quitting, ask your health care provider.  Return to your normal activities as told by your health care provider. Ask your health care provider what activities are safe for you. General instructions  Take over-the-counter and prescription medicines only as told by your health care provider.  Drink enough fluid to keep your urine pale yellow.  Keep all follow-up visits as told by your health care provider. This is important. How is this prevented?  There is no vaccine to help prevent COVID-19 infection. However, there are steps you can take to protect yourself and others from this virus. To protect yourself:   Do not travel to areas where COVID-19 is a risk. The areas where COVID-19 is reported change often. To identify high-risk areas and travel restrictions, check the CDC travel website: wwwnc.cdc.gov/travel/notices  If you live in, or must travel to, an area where COVID-19 is a risk, take precautions to avoid infection. ? Stay away from people who are sick. ? Wash your hands often with soap and water for 20 seconds. If soap and water   are not available, use an alcohol-based hand sanitizer. ? Avoid touching your mouth, face, eyes, or nose. ? Avoid going out in public, follow guidance from your state and local health authorities. ? If you must go out in public, wear a cloth face covering or face mask. Make sure your mask covers your nose and mouth. ? Avoid crowded indoor spaces. Stay at least 6 feet (2 meters) away from others. ? Disinfect objects and surfaces that are frequently touched every day. This may include:  Counters and tables.  Doorknobs and light switches.  Sinks and faucets.  Electronics, such as phones, remote controls, keyboards, computers, and tablets. To protect others: If you have symptoms of COVID-19, take steps to prevent the virus from spreading to others.  If you think you have a COVID-19 infection, contact  your health care provider right away. Tell your health care team that you think you may have a COVID-19 infection.  Stay home. Leave your house only to seek medical care. Do not use public transport.  Do not travel while you are sick.  Wash your hands often with soap and water for 20 seconds. If soap and water are not available, use alcohol-based hand sanitizer.  Stay away from other members of your household. Let healthy household members care for children and pets, if possible. If you have to care for children or pets, wash your hands often and wear a mask. If possible, stay in your own room, separate from others. Use a different bathroom.  Make sure that all people in your household wash their hands well and often.  Cough or sneeze into a tissue or your sleeve or elbow. Do not cough or sneeze into your hand or into the air.  Wear a cloth face covering or face mask. Make sure your mask covers your nose and mouth. Where to find more information  Centers for Disease Control and Prevention: www.cdc.gov/coronavirus/2019-ncov/index.html  World Health Organization: www.who.int/health-topics/coronavirus Contact a health care provider if:  You live in or have traveled to an area where COVID-19 is a risk and you have symptoms of the infection.  You have had contact with someone who has COVID-19 and you have symptoms of the infection. Get help right away if:  You have trouble breathing.  You have pain or pressure in your chest.  You have confusion.  You have bluish lips and fingernails.  You have difficulty waking from sleep.  You have symptoms that get worse. These symptoms may represent a serious problem that is an emergency. Do not wait to see if the symptoms will go away. Get medical help right away. Call your local emergency services (911 in the [place] Do not drive yourself to the hospital. Let the emergency medical personnel know if you think you have  COVID-19. Summary  COVID-19 is a respiratory infection that is caused by a virus. It is also known as coronavirus disease or novel coronavirus. It can cause serious infections, such as pneumonia, acute respiratory distress syndrome, acute respiratory failure, or sepsis.  The virus that causes COVID-19 is contagious. This means that it can spread from person to person through droplets from breathing, speaking, singing, coughing, or sneezing.  You are more likely to develop a serious illness if you are 50 years of age or older, have a weak immune system, live in a nursing home, or have chronic disease.  There is no medicine to treat COVID-19. Your health care provider will talk with you about ways to treat your symptoms.    Take steps to protect yourself and others from infection. Wash your hands often and disinfect objects and surfaces that are frequently touched every day. Stay away from people who are sick and wear a mask if you are sick. This information is not intended to replace advice given to you by your health care provider. Make sure you discuss any questions you have with your health care provider. Document Revised: 12/02/2018 Document Reviewed: 03/10/2018 Elsevier Patient Education  2020 Else[employer] Inc. What types of side effects do monoclonal antibody drugs cause?  Common side effects  In general, the more common side effects caused by monoclonal antibody drugs include: . Allergic reactions, such as hives or itching . Flu-like signs and symptoms, including chills, fatigue, fever, and muscle aches and pains . Nausea, vomiting . Diarrhea . Skin rashes . Low blood pressure   The CDC is recommending patients who receive monoclonal antibody treatments wait at least 90 days before being vaccinated.  Currently, there are no data on the safety and efficacy of mRNA COVID-19 vaccines in persons who received monoclonal antibodies or convalescent plasma as part of COVID-19 treatment. Based  on the estimated half-life of such therapies as well as evidence suggesting that reinfection is uncommon in the 90 days after initial infection, vaccination should be deferred for at least 90 days, as a precautionary measure until additional information becomes available, to avoid interference of the antibody treatment with vaccine-induced immune responses. 

## 2019-03-19 NOTE — Progress Notes (Signed)
  Diagnosis: COVID-19  Physician:dr wright  Procedure: [facility] Med: bamlanivimab infusion - Provided patient with bamlanimivab fact sheet for patients, parents and caregivers prior to infusion.  Complications: No immediate complications noted.  Discharge: Discharged home   Gregori Abril S Jaylnn Ullery 03/19/2019   

## 2019-03-31 ENCOUNTER — Ambulatory Visit: Payer: Medicare HMO | Attending: Internal Medicine

## 2019-03-31 DIAGNOSIS — Z20822 Contact with and (suspected) exposure to covid-19: Secondary | ICD-10-CM | POA: Diagnosis not present

## 2019-04-01 LAB — NOVEL CORONAVIRUS, NAA: SARS-CoV-2, NAA: NOT DETECTED

## 2019-04-11 DIAGNOSIS — R609 Edema, unspecified: Secondary | ICD-10-CM | POA: Diagnosis not present

## 2019-04-11 DIAGNOSIS — E669 Obesity, unspecified: Secondary | ICD-10-CM | POA: Diagnosis not present

## 2019-04-11 DIAGNOSIS — N289 Disorder of kidney and ureter, unspecified: Secondary | ICD-10-CM | POA: Diagnosis not present

## 2019-04-11 DIAGNOSIS — E785 Hyperlipidemia, unspecified: Secondary | ICD-10-CM | POA: Diagnosis not present

## 2019-04-11 DIAGNOSIS — E559 Vitamin D deficiency, unspecified: Secondary | ICD-10-CM | POA: Diagnosis not present

## 2019-04-11 DIAGNOSIS — E1165 Type 2 diabetes mellitus with hyperglycemia: Secondary | ICD-10-CM | POA: Diagnosis not present

## 2019-04-11 DIAGNOSIS — I10 Essential (primary) hypertension: Secondary | ICD-10-CM | POA: Diagnosis not present

## 2019-04-12 DIAGNOSIS — E785 Hyperlipidemia, unspecified: Secondary | ICD-10-CM | POA: Diagnosis not present

## 2019-04-12 DIAGNOSIS — N289 Disorder of kidney and ureter, unspecified: Secondary | ICD-10-CM | POA: Diagnosis not present

## 2019-04-12 DIAGNOSIS — E559 Vitamin D deficiency, unspecified: Secondary | ICD-10-CM | POA: Diagnosis not present

## 2019-04-12 DIAGNOSIS — E1165 Type 2 diabetes mellitus with hyperglycemia: Secondary | ICD-10-CM | POA: Diagnosis not present

## 2019-04-17 DIAGNOSIS — R69 Illness, unspecified: Secondary | ICD-10-CM | POA: Diagnosis not present

## 2019-05-15 DIAGNOSIS — Z7982 Long term (current) use of aspirin: Secondary | ICD-10-CM | POA: Diagnosis not present

## 2019-05-15 DIAGNOSIS — E785 Hyperlipidemia, unspecified: Secondary | ICD-10-CM | POA: Diagnosis not present

## 2019-05-15 DIAGNOSIS — Z7902 Long term (current) use of antithrombotics/antiplatelets: Secondary | ICD-10-CM | POA: Diagnosis not present

## 2019-05-15 DIAGNOSIS — I509 Heart failure, unspecified: Secondary | ICD-10-CM | POA: Diagnosis not present

## 2019-05-15 DIAGNOSIS — E261 Secondary hyperaldosteronism: Secondary | ICD-10-CM | POA: Diagnosis not present

## 2019-05-15 DIAGNOSIS — E1151 Type 2 diabetes mellitus with diabetic peripheral angiopathy without gangrene: Secondary | ICD-10-CM | POA: Diagnosis not present

## 2019-05-15 DIAGNOSIS — E1142 Type 2 diabetes mellitus with diabetic polyneuropathy: Secondary | ICD-10-CM | POA: Diagnosis not present

## 2019-05-15 DIAGNOSIS — Z7984 Long term (current) use of oral hypoglycemic drugs: Secondary | ICD-10-CM | POA: Diagnosis not present

## 2019-05-15 DIAGNOSIS — I110 Hypertensive heart disease with heart failure: Secondary | ICD-10-CM | POA: Diagnosis not present

## 2019-05-15 DIAGNOSIS — Z008 Encounter for other general examination: Secondary | ICD-10-CM | POA: Diagnosis not present

## 2019-05-15 DIAGNOSIS — Z79899 Other long term (current) drug therapy: Secondary | ICD-10-CM | POA: Diagnosis not present

## 2019-05-31 DIAGNOSIS — E669 Obesity, unspecified: Secondary | ICD-10-CM | POA: Diagnosis not present

## 2019-05-31 DIAGNOSIS — N289 Disorder of kidney and ureter, unspecified: Secondary | ICD-10-CM | POA: Diagnosis not present

## 2019-05-31 DIAGNOSIS — E1165 Type 2 diabetes mellitus with hyperglycemia: Secondary | ICD-10-CM | POA: Diagnosis not present

## 2019-05-31 DIAGNOSIS — E559 Vitamin D deficiency, unspecified: Secondary | ICD-10-CM | POA: Diagnosis not present

## 2019-06-02 ENCOUNTER — Encounter (INDEPENDENT_AMBULATORY_CARE_PROVIDER_SITE_OTHER): Payer: Self-pay | Admitting: Vascular Surgery

## 2019-06-02 ENCOUNTER — Ambulatory Visit (INDEPENDENT_AMBULATORY_CARE_PROVIDER_SITE_OTHER): Payer: Medicare HMO | Admitting: Vascular Surgery

## 2019-06-02 ENCOUNTER — Other Ambulatory Visit: Payer: Self-pay

## 2019-06-02 VITALS — BP 159/80 | HR 90 | Resp 14 | Ht 68.0 in | Wt 288.0 lb

## 2019-06-02 DIAGNOSIS — L97211 Non-pressure chronic ulcer of right calf limited to breakdown of skin: Secondary | ICD-10-CM

## 2019-06-02 DIAGNOSIS — I7025 Atherosclerosis of native arteries of other extremities with ulceration: Secondary | ICD-10-CM | POA: Insufficient documentation

## 2019-06-02 DIAGNOSIS — I890 Lymphedema, not elsewhere classified: Secondary | ICD-10-CM

## 2019-06-02 DIAGNOSIS — I10 Essential (primary) hypertension: Secondary | ICD-10-CM | POA: Diagnosis not present

## 2019-06-02 NOTE — Assessment & Plan Note (Signed)
blood pressure control important in reducing the progression of atherosclerotic disease. On appropriate oral medications.  

## 2019-06-02 NOTE — Progress Notes (Signed)
MRN : [id number]  [name] is a 74 y.o. (Dec 23, 1945) female who presents with chief complaint of  Chief Complaint  Patient presents with  . Follow-up    pt consult; fluid on rle and severe pain   .  History of Present Illness: Patient returns today in follow up of a new ulceration on the right calf.  She has had some chronic mild swelling but it is actually been under good control.  She developed about a 5 cm circular blister and ulceration on the anterior portion of the right mid lower leg.  This is slightly lateral as well.  She says the home health nurse told her her blood flow was not good when they checked her arteries and she does have a long history of peripheral arterial disease with intervention many years ago.  She had not been seen in our office in about a year and a half.  No trauma or injury.  No fevers or chills.  The dressing is saturated with clear fluid today.  Her legs are dependent much of the time and her mobility is certainly less than it used to be.  It is very painful.  Current Outpatient Medications  Medication Sig Dispense Refill  . aspirin EC 81 MG tablet Take 81 mg by mouth daily.    . celecoxib (CELEBREX) 200 MG capsule Take 200 mg by mouth daily.    . clopidogrel (PLAVIX) 75 MG tablet Take 1 tablet (75 mg total) by mouth daily. 30 tablet 11  . cyclobenzaprine (FLEXERIL) 10 MG tablet Take 10 mg by mouth 3 (three) times daily as needed. For muscle pain    . furosemide (LASIX) 20 MG tablet Take 20 mg by mouth daily.    Marland Kitchen glipiZIDE (GLUCOTROL XL) 5 MG 24 hr tablet Take 5 mg by mouth 2 (two) times daily.    . Liraglutide (VICTOZA) 18 MG/3ML SOPN Inject into the skin.    Marland Kitchen lisinopril-hydrochlorothiazide (PRINZIDE,ZESTORETIC) 20-25 MG per tablet Take 1 tablet by mouth daily.    . metFORMIN (GLUCOPHAGE) 1000 MG tablet Take 1,000 mg by mouth 2 (two) times daily with a meal.    . metoprolol succinate (TOPROL-XL) 25 MG 24 hr tablet Take 25 mg by mouth daily.    .  naproxen (NAPROSYN) 375 MG tablet Take 375 mg by mouth 3 (three) times daily with meals.    Marland Kitchen omeprazole (PRILOSEC) 20 MG capsule Take 20 mg by mouth daily.    . predniSONE (DELTASONE) 10 MG tablet Take 10-40 mg by mouth daily. Reported on 03/11/2015    . pregabalin (LYRICA) 25 MG capsule Take 50 mg by mouth 3 (three) times daily.     . rosuvastatin (CRESTOR) 5 MG tablet Take 5 mg by mouth daily.    . saxagliptin HCl (ONGLYZA) 5 MG TABS tablet Take 5 mg by mouth daily.     No current facility-administered medications for this visit.    Past Medical History:  Diagnosis Date  . Anemia    as young child  . Arthritis    back, leg and foot  . Blood transfusion    1964  . Carpal tunnel syndrome   . Diabetes mellitus    oral medications  . Hypertension     Past Surgical History:  Procedure Laterality Date  . ABDOMINAL HYSTERECTOMY    . ANTERIOR CERVICAL DECOMP/DISCECTOMY FUSION  04/27/2011   Procedure: ANTERIOR CERVICAL DECOMPRESSION/DISCECTOMY FUSION 2 LEVELS;  Surgeon: Elaina Hoops, MD;  Location: [facility]  NEURO ORS;  Service: Neurosurgery;  Laterality: N/A;  Anterior Cervical Decompression/ Discectomy Fusion Cervical Three-Four, Cervical Four-Five  . APPENDECTOMY    . KNEE ARTHROPLASTY     right  . PERIPHERAL VASCULAR CATHETERIZATION Left 10/29/2014   Procedure: Lower Extremity Angiography;  Surgeon: Annice Needy, MD;  Location: [facility] INVASIVE CV LAB;  Service: Cardiovascular;  Laterality: Left;  . PERIPHERAL VASCULAR CATHETERIZATION Left 10/29/2014   Procedure: Lower Extremity Intervention;  Surgeon: Annice Needy, MD;  Location: [facility] INVASIVE CV LAB;  Service: Cardiovascular;  Laterality: Left;  . PERIPHERAL VASCULAR CATHETERIZATION Left 03/11/2015   Procedure: Lower Extremity Angiography;  Surgeon: Annice Needy, MD;  Location: [facility] INVASIVE CV LAB;  Service: Cardiovascular;  Laterality: Left;  . PERIPHERAL VASCULAR CATHETERIZATION Left 03/11/2015   Procedure: Lower Extremity Intervention;   Surgeon: Annice Needy, MD;  Location: [facility] INVASIVE CV LAB;  Service: Cardiovascular;  Laterality: Left;     Social History   Tobacco Use  . Smoking status: Never Smoker  . Smokeless tobacco: Never Used  Substance Use Topics  . Alcohol use: No  . Drug use: No      Family History  Problem Relation Age of Onset  . Cancer Mother   . Hypertension Mother   . Diabetes Paternal Grandmother   No bleeding or clotting disorders  Allergies  Allergen Reactions  . Penicillins Shortness Of Breath and Swelling  . Codeine Other (See Comments)    Ringing in ear     REVIEW OF SYSTEMS (Negative unless checked)  Constitutional: [] Weight loss  [] Fever  [] Chills Cardiac: [] Chest pain   [] Chest pressure   [] Palpitations   [] Shortness of breath when laying flat   [] Shortness of breath at rest   [] Shortness of breath with exertion. Vascular:  [x] Pain in legs with walking   [x] Pain in legs at rest   [] Pain in legs when laying flat   [] Claudication   [] Pain in feet when walking  [] Pain in feet at rest  [] Pain in feet when laying flat   [] History of DVT   [] Phlebitis   [x] Swelling in legs   [] Varicose veins   [x] Non-healing ulcers Pulmonary:   [] Uses home oxygen   [] Productive cough   [] Hemoptysis   [] Wheeze  [] COPD   [] Asthma Neurologic:  [] Dizziness  [] Blackouts   [] Seizures   [] History of stroke   [] History of TIA  [] Aphasia   [] Temporary blindness   [] Dysphagia   [] Weakness or numbness in arms   [] Weakness or numbness in legs Musculoskeletal:  [x] Arthritis   [] Joint swelling   [x] Joint pain   [] Low back pain Hematologic:  [] Easy bruising  [] Easy bleeding   [] Hypercoagulable state   [] Anemic   Gastrointestinal:  [] Blood in stool   [] Vomiting blood  [] Gastroesophageal reflux/heartburn   [] Abdominal pain Genitourinary:  [] Chronic kidney disease   [] Difficult urination  [] Frequent urination  [] Burning with urination   [] Hematuria Skin:  [] Rashes   [x] Ulcers   [x] Wounds Psychological:  [] History of  anxiety   []  History of major depression.  Physical Examination  BP (!) 159/80 (BP Location: Right Arm)   Pulse 90   Resp 14   Ht 5\' 8"  (1.727 m)   Wt 288 lb (130.6 kg)   BMI 43.79 kg/m  Gen:  WD/WN, NAD Head: [place]/AT, No temporalis wasting. Ear/Nose/Throat: Hearing grossly intact, nares w/o erythema or drainage Eyes: Conjunctiva clear. Sclera non-icteric Neck: Supple.  Trachea midline Pulmonary:  Good air movement, no use of accessory muscles.  Cardiac: RRR,  no JVD Vascular:  Vessel Right Left  Radial Palpable Palpable                          PT  not palpable  not palpable  DP  trace palpable  1+ palpable    Musculoskeletal: M/S 5/5 throughout.  No deformity or atrophy.  In a wheelchair.  5 to 6 cm circular ulceration on the right lateral mid lower leg.  2+ bilateral lower extremity edema. Neurologic: Sensation grossly intact in extremities.  Symmetrical.  Speech is fluent.  Psychiatric: Judgment intact, Mood & affect appropriate for pt's clinical situation. Dermatologic: Right calf wound as described above       Labs Recent Results (from the past 2160 hour(s))  Novel Coronavirus, NAA ([facility])     Status: Abnormal   Collection Time: 03/15/19  9:53 AM   Specimen: Nasopharyngeal(NP) swabs in vial transport medium   NASOPHARYNGE  TESTING  Result Value Ref Range   SARS-CoV-2, NAA Detected (A) Not Detected    Comment: This nucleic acid amplification test was developed and its performance characteristics determined by [employer]. Nucleic acid amplification tests include RT-PCR and TMA. This test has not been FDA cleared or approved. This test has been authorized by FDA under an Emergency Use Authorization (EUA). This test is only authorized for the duration of time the declaration that circumstances exist justifying the authorization of the emergency use of in vitro diagnostic tests for detection of SARS-CoV-2 virus and/or diagnosis of COVID-19 infection  under section 564(b)(1) of the Act, 21 U.S.C. 094BSJ-6(G) (1), unless the authorization is terminated or revoked sooner. When diagnostic testing is negative, the possibility of a false negative result should be considered in the context of a patient's recent exposures and the presence of clinical signs and symptoms consistent with COVID-19. An individual without symptoms of COVID-19 and who is not shedding SARS-CoV-2 virus wo uld expect to have a negative (not detected) result in this assay.   Novel Coronavirus, NAA ([facility])     Status: None   Collection Time: 03/31/19  2:10 PM   Specimen: Nasopharyngeal(NP) swabs in vial transport medium   NASOPHARYNGE  TESTING  Result Value Ref Range   SARS-CoV-2, NAA Not Detected Not Detected    Comment: This nucleic acid amplification test was developed and its performance characteristics determined by [employer]. Nucleic acid amplification tests include RT-PCR and TMA. This test has not been FDA cleared or approved. This test has been authorized by FDA under an Emergency Use Authorization (EUA). This test is only authorized for the duration of time the declaration that circumstances exist justifying the authorization of the emergency use of in vitro diagnostic tests for detection of SARS-CoV-2 virus and/or diagnosis of COVID-19 infection under section 564(b)(1) of the Act, 21 U.S.C. 836OQH-4(T) (1), unless the authorization is terminated or revoked sooner. When diagnostic testing is negative, the possibility of a false negative result should be considered in the context of a patient's recent exposures and the presence of clinical signs and symptoms consistent with COVID-19. An individual without symptoms of COVID-19 and who is not shedding SARS-CoV-2 virus wo uld expect to have a negative (not detected) result in this assay.     Radiology No results found.  Assessment/Plan  Essential hypertension, benign blood pressure  control important in reducing the progression of atherosclerotic disease. On appropriate oral medications.   Lower limb ulcer, calf, right, limited to breakdown of skin (HCC) A 3 layer  Unna boot was placed today and will be changed weekly.  This should help control her swelling as well.  Continue to try to elevate her legs.  ABIs to be checked in the near future at her convenience due to her known history of PAD.  Lymphedema Swelling is under reasonably good control, but now has a new ulceration on the right leg.  Atherosclerosis of native arteries of the extremities with ulceration (HCC) This represents a potentially critical and limb threatening situation now that she has an ulceration with a known history of PAD.  ABIs will be done in the near future at her convenience.  Should these be reduced, angiogram would be necessary to improve her perfusion for wound healing.    Festus Barren, MD  06/02/2019 12:36 PM    This note was created with Dragon medical transcription system.  Any errors from dictation are purely unintentional

## 2019-06-02 NOTE — Assessment & Plan Note (Signed)
Swelling is under reasonably good control, but now has a new ulceration on the right leg.

## 2019-06-02 NOTE — Assessment & Plan Note (Signed)
A 3 layer Unna boot was placed today and will be changed weekly.  This should help control her swelling as well.  Continue to try to elevate her legs.  ABIs to be checked in the near future at her convenience due to her known history of PAD.

## 2019-06-02 NOTE — Patient Instructions (Signed)
Peripheral Vascular Disease  Peripheral vascular disease (PVD) is a disease of the blood vessels that are not part of your heart and brain. A simple term for PVD is poor circulation. In most cases, PVD narrows the blood vessels that carry blood from your heart to the rest of your body. This can reduce the supply of blood to your arms, legs, and internal organs, like your stomach or kidneys. However, PVD most often affects a person's lower legs and feet. Without treatment, PVD tends to get worse. PVD can also lead to acute ischemic limb. This is when an arm or leg suddenly cannot get enough blood. This is a medical emergency. Follow these instructions at home: Lifestyle  Do not use any products that contain nicotine or tobacco, such as cigarettes and e-cigarettes. If you need help quitting, ask your doctor.  Lose weight if you are overweight. Or, stay at a healthy weight as told by your doctor.  Eat a diet that is low in fat and cholesterol. If you need help, ask your doctor.  Exercise regularly. Ask your doctor for activities that are right for you. General instructions  Take over-the-counter and prescription medicines only as told by your doctor.  Take good care of your feet: ? Wear comfortable shoes that fit well. ? Check your feet often for any cuts or sores.  Keep all follow-up visits as told by your doctor This is important. Contact a doctor if:  You have cramps in your legs when you walk.  You have leg pain when you are at rest.  You have coldness in a leg or foot.  Your skin changes.  You are unable to get or have an erection (erectile dysfunction).  You have cuts or sores on your feet that do not heal. Get help right away if:  Your arm or leg turns cold, numb, and blue.  Your arms or legs become red, warm, swollen, painful, or numb.  You have chest pain.  You have trouble breathing.  You suddenly have weakness in your face, arm, or leg.  You become very  confused or you cannot speak.  You suddenly have a very bad headache.  You suddenly cannot see. Summary  Peripheral vascular disease (PVD) is a disease of the blood vessels.  A simple term for PVD is poor circulation. Without treatment, PVD tends to get worse.  Treatment may include exercise, low fat and low cholesterol diet, and quitting smoking. This information is not intended to replace advice given to you by your health care provider. Make sure you discuss any questions you have with your health care provider. Document Revised: 01/15/2017 Document Reviewed: 03/12/2016 Elsevier Patient Education  2020 [employer]  

## 2019-06-02 NOTE — Assessment & Plan Note (Signed)
This represents a potentially critical and limb threatening situation now that she has an ulceration with a known history of PAD.  ABIs will be done in the near future at her convenience.  Should these be reduced, angiogram would be necessary to improve her perfusion for wound healing.

## 2019-06-09 ENCOUNTER — Encounter (INDEPENDENT_AMBULATORY_CARE_PROVIDER_SITE_OTHER): Payer: Self-pay

## 2019-06-09 ENCOUNTER — Other Ambulatory Visit: Payer: Self-pay

## 2019-06-09 ENCOUNTER — Ambulatory Visit (INDEPENDENT_AMBULATORY_CARE_PROVIDER_SITE_OTHER): Payer: Medicare HMO | Admitting: Nurse Practitioner

## 2019-06-09 VITALS — BP 136/69 | HR 95 | Resp 16

## 2019-06-09 DIAGNOSIS — L97211 Non-pressure chronic ulcer of right calf limited to breakdown of skin: Secondary | ICD-10-CM

## 2019-06-09 NOTE — Progress Notes (Signed)
History of Present Illness  There is no documented history at this time  Assessments & Plan   There are no diagnoses linked to this encounter.    Additional instructions  Subjective:  Patient presents with venous ulcer of the Right lower extremity.    Procedure:  3 layer unna wrap was placed Right lower extremity.   Plan:   Follow up in one week.   

## 2019-06-16 ENCOUNTER — Ambulatory Visit (INDEPENDENT_AMBULATORY_CARE_PROVIDER_SITE_OTHER): Payer: Medicare HMO | Admitting: Nurse Practitioner

## 2019-06-16 ENCOUNTER — Other Ambulatory Visit: Payer: Self-pay

## 2019-06-16 VITALS — BP 146/72 | HR 79 | Ht 68.0 in | Wt 288.0 lb

## 2019-06-16 DIAGNOSIS — L97211 Non-pressure chronic ulcer of right calf limited to breakdown of skin: Secondary | ICD-10-CM | POA: Diagnosis not present

## 2019-06-16 NOTE — Progress Notes (Signed)
History of Present Illness  There is no documented history at this time  Assessments & Plan   There are no diagnoses linked to this encounter.    Additional instructions  Subjective:  Patient presents with venous ulcer of the Bilateral lower extremity.    Procedure:  3 layer unna wrap was placed Bilateral lower extremity.   Plan:   Follow up in one week.  

## 2019-06-19 ENCOUNTER — Other Ambulatory Visit: Payer: Self-pay

## 2019-06-19 ENCOUNTER — Telehealth (INDEPENDENT_AMBULATORY_CARE_PROVIDER_SITE_OTHER): Payer: Self-pay

## 2019-06-19 ENCOUNTER — Encounter (INDEPENDENT_AMBULATORY_CARE_PROVIDER_SITE_OTHER): Payer: Self-pay

## 2019-06-19 ENCOUNTER — Ambulatory Visit (INDEPENDENT_AMBULATORY_CARE_PROVIDER_SITE_OTHER): Payer: Medicare HMO | Admitting: Nurse Practitioner

## 2019-06-19 VITALS — BP 125/73 | HR 85 | Resp 16

## 2019-06-19 DIAGNOSIS — L97211 Non-pressure chronic ulcer of right calf limited to breakdown of skin: Secondary | ICD-10-CM | POA: Diagnosis not present

## 2019-06-19 NOTE — Telephone Encounter (Signed)
Patient left a voicemail informing that her wrap that was placed on Friday was tight at the top her leg and has been rubbing and causing pain. The patient is schedule to come in to have leg rewrapped today

## 2019-06-19 NOTE — Progress Notes (Signed)
History of Present Illness  There is no documented history at this time  Assessments & Plan   There are no diagnoses linked to this encounter.    Additional instructions  Subjective:  Patient presents with venous ulcer of the Right lower extremity.    Procedure:  3 layer unna wrap was placed Right lower extremity.   Plan:   Follow up in one week.   

## 2019-06-23 ENCOUNTER — Encounter (INDEPENDENT_AMBULATORY_CARE_PROVIDER_SITE_OTHER): Payer: Self-pay

## 2019-06-23 ENCOUNTER — Ambulatory Visit (INDEPENDENT_AMBULATORY_CARE_PROVIDER_SITE_OTHER): Payer: Medicare HMO | Admitting: Nurse Practitioner

## 2019-06-23 ENCOUNTER — Other Ambulatory Visit: Payer: Self-pay

## 2019-06-23 VITALS — BP 117/63 | HR 73 | Resp 16

## 2019-06-23 DIAGNOSIS — L97211 Non-pressure chronic ulcer of right calf limited to breakdown of skin: Secondary | ICD-10-CM | POA: Diagnosis not present

## 2019-06-23 NOTE — Progress Notes (Signed)
History of Present Illness  There is no documented history at this time  Assessments & Plan   There are no diagnoses linked to this encounter.    Additional instructions  Subjective:  Patient presents with venous ulcer of the Right lower extremity.    Procedure:  3 layer unna wrap was placed Right lower extremity.   Plan:   Follow up in one week.   

## 2019-06-30 ENCOUNTER — Ambulatory Visit (INDEPENDENT_AMBULATORY_CARE_PROVIDER_SITE_OTHER): Payer: Medicare HMO

## 2019-06-30 ENCOUNTER — Encounter (INDEPENDENT_AMBULATORY_CARE_PROVIDER_SITE_OTHER): Payer: Self-pay | Admitting: Nurse Practitioner

## 2019-06-30 ENCOUNTER — Other Ambulatory Visit: Payer: Self-pay

## 2019-06-30 ENCOUNTER — Ambulatory Visit (INDEPENDENT_AMBULATORY_CARE_PROVIDER_SITE_OTHER): Payer: Medicare HMO | Admitting: Nurse Practitioner

## 2019-06-30 VITALS — BP 136/72 | HR 76 | Resp 16

## 2019-06-30 DIAGNOSIS — I7025 Atherosclerosis of native arteries of other extremities with ulceration: Secondary | ICD-10-CM

## 2019-06-30 DIAGNOSIS — I890 Lymphedema, not elsewhere classified: Secondary | ICD-10-CM | POA: Diagnosis not present

## 2019-06-30 DIAGNOSIS — L97211 Non-pressure chronic ulcer of right calf limited to breakdown of skin: Secondary | ICD-10-CM

## 2019-06-30 DIAGNOSIS — I10 Essential (primary) hypertension: Secondary | ICD-10-CM | POA: Diagnosis not present

## 2019-06-30 NOTE — Progress Notes (Signed)
Subjective:    Patient ID: [name], female    DOB: 10-Dec-1945, 74 y.o.   MRN: [id number] Chief Complaint  Patient presents with  . Follow-up    unna and ultrasound follow up    Patient returns today with noninvasive studies and follow-up of ulceration on right calf.  Previously the patient had chronic mild swelling that was under good control.  Patient had a previous endovenous ablation of the right lower extremity.  However she recently developed a circular blister on the anterior portion of her right leg.  The patient states that she had a home health screen by her insurance company and the nurse told her that she had a severe lack of blood flow.  The patient had a known history of peripheral arterial disease so there was concern that the patient may not be adequately able to heal her lower extremity wound.  The patient also notes that she recently dropped a container in her leg that also caused another ulcer on the lateral portion of her left lower extremity.  The wound that was previously on the right lower extremity it has gotten smaller and it appears to be progressively healing.  The patient states that the pain is better controlled.  She also states that the swelling is much better.  She denies any claudication-like symptoms.  She denies any fever, chills, nausea, vomiting or diarrhea.  The patient does note that she has cut down on salt significantly to help with her lower extremity edema.  The patient has tolerated her Unna wraps well.  Today noninvasive studies show an ABI of 0.83 on the right and 1.21 on the left.  These are consistent with previous ABIs that were done in 2018.  Patient has biphasic waveforms in the right lower extremity with triphasic/monophasic waveforms in the left lower extremity.  The patient has strong waveforms in the bilateral great digits  Right lower extremity venous reflux study shows evidence of deep venous insufficiency in the common femoral vein.  There  is also evidence of reflux in the right saphenous vein at the saphenofemoral junction.  The prior ablation is intact in the great saphenous vein at the proximal thigh extending to the knee.  No evidence of superficial venous thrombosis.  No evidence of DVT.   Review of Systems  Cardiovascular: Positive for leg swelling.  Skin: Positive for wound.  All other systems reviewed and are negative.      Objective:   Physical Exam Vitals reviewed.  Constitutional:      Appearance: She is obese.  Cardiovascular:     Rate and Rhythm: Normal rate and regular rhythm.     Pulses: Decreased pulses.  Pulmonary:     Effort: Pulmonary effort is normal.     Breath sounds: Normal breath sounds.  Musculoskeletal:     Right lower leg: 1+ Edema present.     Left lower leg: 1+ Edema present.  Skin:    General: Skin is warm.     Findings: Signs of injury present.       Neurological:     Mental Status: She is alert and oriented to person, place, and time.  Psychiatric:        Mood and Affect: Mood normal.        Behavior: Behavior normal.        Thought Content: Thought content normal.        Judgment: Judgment normal.     BP 136/72 (BP Location: Right Arm)  Pulse 76   Resp 16   Past Medical History:  Diagnosis Date  . Anemia    as young child  . Arthritis    back, leg and foot  . Blood transfusion    1964  . Carpal tunnel syndrome   . Diabetes mellitus    oral medications  . Hypertension     Social History   Socioeconomic History  . Marital status: Married    Spouse name: Not on file  . Number of children: Not on file  . Years of education: Not on file  . Highest education level: Not on file  Occupational History  . Not on file  Tobacco Use  . Smoking status: Never Smoker  . Smokeless tobacco: Never Used  Substance and Sexual Activity  . Alcohol use: No  . Drug use: No  . Sexual activity: Not on file  Other Topics Concern  . Not on file  Social History Narrative    . Not on file   Social Determinants of Health   Financial Resource Strain:   . Difficulty of Paying Living Expenses:   Food Insecurity:   . Worried About Programme researcher, broadcasting/film/video in the Last Year:   . Barista in the Last Year:   Transportation Needs:   . Freight forwarder (Medical):   Marland Kitchen Lack of Transportation (Non-Medical):   Physical Activity:   . Days of Exercise per Week:   . Minutes of Exercise per Session:   Stress:   . Feeling of Stress :   Social Connections:   . Frequency of Communication with Friends and Family:   . Frequency of Social Gatherings with Friends and Family:   . Attends Religious Services:   . Active Member of Clubs or Organizations:   . Attends Banker Meetings:   Marland Kitchen Marital Status:   Intimate Partner Violence:   . Fear of Current or Ex-Partner:   . Emotionally Abused:   Marland Kitchen Physically Abused:   . Sexually Abused:     Past Surgical History:  Procedure Laterality Date  . ABDOMINAL HYSTERECTOMY    . ANTERIOR CERVICAL DECOMP/DISCECTOMY FUSION  04/27/2011   Procedure: ANTERIOR CERVICAL DECOMPRESSION/DISCECTOMY FUSION 2 LEVELS;  Surgeon: Mariam Dollar, MD;  Location: [facility];  Service: Neurosurgery;  Laterality: N/A;  Anterior Cervical Decompression/ Discectomy Fusion Cervical Three-Four, Cervical Four-Five  . APPENDECTOMY    . KNEE ARTHROPLASTY     right  . PERIPHERAL VASCULAR CATHETERIZATION Left 10/29/2014   Procedure: Lower Extremity Angiography;  Surgeon: Annice Needy, MD;  Location: [facility];  Service: Cardiovascular;  Laterality: Left;  . PERIPHERAL VASCULAR CATHETERIZATION Left 10/29/2014   Procedure: Lower Extremity Intervention;  Surgeon: Annice Needy, MD;  Location: [facility];  Service: Cardiovascular;  Laterality: Left;  . PERIPHERAL VASCULAR CATHETERIZATION Left 03/11/2015   Procedure: Lower Extremity Angiography;  Surgeon: Annice Needy, MD;  Location: [facility];  Service: Cardiovascular;   Laterality: Left;  . PERIPHERAL VASCULAR CATHETERIZATION Left 03/11/2015   Procedure: Lower Extremity Intervention;  Surgeon: Annice Needy, MD;  Location: [facility];  Service: Cardiovascular;  Laterality: Left;    Family History  Problem Relation Age of Onset  . Cancer Mother   . Hypertension Mother   . Diabetes Paternal Grandmother     Allergies  Allergen Reactions  . Penicillins Shortness Of Breath and Swelling  . Codeine Other (See Comments)    Ringing in  ear       Assessment & Plan:   1. Lymphedema The patient will continue with conservative therapy such as elevating her lower extremities and exercising and watching things such as salt from her diet.  Currently the patient will be in bilateral Unna wraps however she is advised that she will need to return to medical grade 1 compression when she is out of these Unna wraps.  2. Lower limb ulcer, calf, right, limited to breakdown of skin [facility]) The patient now has ulcerations on her bilateral lower extremities.  We will place the patient's bilateral lower extremities back in Unna wraps.  The patient will present to our office on a weekly basis to have these wraps changed.  The patient will continue with conservative therapy such as elevation of lower extremities.  She will also try to avoid things such as salt to help with her lower extremity edema.  Otherwise we will see the patient in 4 weeks to evaluate progression with wound healing.  3. Essential hypertension, benign Continue antihypertensive medications as already ordered, these medications have been reviewed and there are no changes at this time.   4. Atherosclerosis of native arteries of the extremities with ulceration (HCC)  Recommend:  The patient has evidence of atherosclerosis of the lower extremities with claudication.  The patient does not voice lifestyle limiting changes at this point in time.  While the patient does have 2 lower extremity wounds are more  venous in nature she should have more than adequate enough blood flow for wound healing.  Noninvasive studies do not suggest clinically significant change.  No invasive studies, angiography or surgery at this time The patient should continue walking and begin a more formal exercise program.  The patient should continue antiplatelet therapy and aggressive treatment of the lipid abnormalities  No changes in the patient's medications at this time  The patient should continue wearing graduated compression socks 10-15 mmHg strength to control the mild edema.   We will follow with noninvasive studies in 1 year.   Current Outpatient Medications on File Prior to Visit  Medication Sig Dispense Refill  . aspirin EC 81 MG tablet Take 81 mg by mouth daily.    . celecoxib (CELEBREX) 200 MG capsule Take 200 mg by mouth daily.    . clopidogrel (PLAVIX) 75 MG tablet Take 1 tablet (75 mg total) by mouth daily. 30 tablet 11  . cyclobenzaprine (FLEXERIL) 10 MG tablet Take 10 mg by mouth 3 (three) times daily as needed. For muscle pain    . furosemide (LASIX) 20 MG tablet Take 20 mg by mouth daily.    Marland Kitchen glipiZIDE (GLUCOTROL XL) 5 MG 24 hr tablet Take 5 mg by mouth 2 (two) times daily.    . Liraglutide (VICTOZA) 18 MG/3ML SOPN Inject into the skin.    Marland Kitchen lisinopril-hydrochlorothiazide (PRINZIDE,ZESTORETIC) 20-25 MG per tablet Take 1 tablet by mouth daily.    . metFORMIN (GLUCOPHAGE) 1000 MG tablet Take 1,000 mg by mouth 2 (two) times daily with a meal.    . metoprolol succinate (TOPROL-XL) 25 MG 24 hr tablet Take 25 mg by mouth daily.    . naproxen (NAPROSYN) 375 MG tablet Take 375 mg by mouth 3 (three) times daily with meals.    Marland Kitchen omeprazole (PRILOSEC) 20 MG capsule Take 20 mg by mouth daily.    . predniSONE (DELTASONE) 10 MG tablet Take 10-40 mg by mouth daily. Reported on 03/11/2015    . pregabalin (LYRICA) 25 MG capsule  Take 50 mg by mouth 3 (three) times daily.     . rosuvastatin (CRESTOR) 5 MG tablet  Take 5 mg by mouth daily.    . saxagliptin HCl (ONGLYZA) 5 MG TABS tablet Take 5 mg by mouth daily.     No current facility-administered medications on file prior to visit.    There are no Patient Instructions on file for this visit. No follow-ups on file.   Georgiana Spinner, NP

## 2019-07-07 ENCOUNTER — Ambulatory Visit (INDEPENDENT_AMBULATORY_CARE_PROVIDER_SITE_OTHER): Payer: Medicare HMO | Admitting: Nurse Practitioner

## 2019-07-07 ENCOUNTER — Encounter (INDEPENDENT_AMBULATORY_CARE_PROVIDER_SITE_OTHER): Payer: Self-pay

## 2019-07-07 ENCOUNTER — Other Ambulatory Visit: Payer: Self-pay

## 2019-07-07 VITALS — BP 131/63 | HR 78 | Resp 16

## 2019-07-07 DIAGNOSIS — L97211 Non-pressure chronic ulcer of right calf limited to breakdown of skin: Secondary | ICD-10-CM

## 2019-07-07 NOTE — Progress Notes (Signed)
History of Present Illness  There is no documented history at this time  Assessments & Plan   There are no diagnoses linked to this encounter.    Additional instructions  Subjective:  Patient presents with venous ulcer of the Bilateral lower extremity.    Procedure:  3 layer unna wrap was placed Bilateral lower extremity.   Plan:   Follow up in one week.  

## 2019-07-14 ENCOUNTER — Ambulatory Visit (INDEPENDENT_AMBULATORY_CARE_PROVIDER_SITE_OTHER): Payer: Medicare HMO | Admitting: Nurse Practitioner

## 2019-07-14 ENCOUNTER — Other Ambulatory Visit: Payer: Self-pay

## 2019-07-14 VITALS — BP 128/63 | HR 76 | Ht 67.0 in | Wt 288.0 lb

## 2019-07-14 DIAGNOSIS — L97211 Non-pressure chronic ulcer of right calf limited to breakdown of skin: Secondary | ICD-10-CM | POA: Diagnosis not present

## 2019-07-14 NOTE — Progress Notes (Signed)
History of Present Illness  There is no documented history at this time  Assessments & Plan   There are no diagnoses linked to this encounter.    Additional instructions  Subjective:  Patient presents with venous ulcer of the Bilateral lower extremity.    Procedure:  3 layer unna wrap was placed Bilateral lower extremity.   Plan:   Follow up in one week.  

## 2019-07-18 ENCOUNTER — Encounter (INDEPENDENT_AMBULATORY_CARE_PROVIDER_SITE_OTHER): Payer: Self-pay | Admitting: Nurse Practitioner

## 2019-07-20 DIAGNOSIS — E1165 Type 2 diabetes mellitus with hyperglycemia: Secondary | ICD-10-CM | POA: Diagnosis not present

## 2019-07-20 DIAGNOSIS — N289 Disorder of kidney and ureter, unspecified: Secondary | ICD-10-CM | POA: Diagnosis not present

## 2019-07-20 DIAGNOSIS — M129 Arthropathy, unspecified: Secondary | ICD-10-CM | POA: Diagnosis not present

## 2019-07-20 DIAGNOSIS — D509 Iron deficiency anemia, unspecified: Secondary | ICD-10-CM | POA: Diagnosis not present

## 2019-07-20 DIAGNOSIS — E785 Hyperlipidemia, unspecified: Secondary | ICD-10-CM | POA: Diagnosis not present

## 2019-07-20 DIAGNOSIS — I10 Essential (primary) hypertension: Secondary | ICD-10-CM | POA: Diagnosis not present

## 2019-07-20 DIAGNOSIS — E669 Obesity, unspecified: Secondary | ICD-10-CM | POA: Diagnosis not present

## 2019-07-20 DIAGNOSIS — E559 Vitamin D deficiency, unspecified: Secondary | ICD-10-CM | POA: Diagnosis not present

## 2019-07-21 ENCOUNTER — Other Ambulatory Visit: Payer: Self-pay

## 2019-07-21 ENCOUNTER — Encounter (INDEPENDENT_AMBULATORY_CARE_PROVIDER_SITE_OTHER): Payer: Self-pay

## 2019-07-21 ENCOUNTER — Ambulatory Visit (INDEPENDENT_AMBULATORY_CARE_PROVIDER_SITE_OTHER): Payer: Medicare HMO | Admitting: Nurse Practitioner

## 2019-07-21 VITALS — BP 120/74 | HR 73 | Resp 16

## 2019-07-21 DIAGNOSIS — N289 Disorder of kidney and ureter, unspecified: Secondary | ICD-10-CM | POA: Diagnosis not present

## 2019-07-21 DIAGNOSIS — E669 Obesity, unspecified: Secondary | ICD-10-CM | POA: Diagnosis not present

## 2019-07-21 DIAGNOSIS — L97211 Non-pressure chronic ulcer of right calf limited to breakdown of skin: Secondary | ICD-10-CM | POA: Diagnosis not present

## 2019-07-21 DIAGNOSIS — E559 Vitamin D deficiency, unspecified: Secondary | ICD-10-CM | POA: Diagnosis not present

## 2019-07-21 DIAGNOSIS — E1165 Type 2 diabetes mellitus with hyperglycemia: Secondary | ICD-10-CM | POA: Diagnosis not present

## 2019-07-21 DIAGNOSIS — M129 Arthropathy, unspecified: Secondary | ICD-10-CM | POA: Diagnosis not present

## 2019-07-21 DIAGNOSIS — D509 Iron deficiency anemia, unspecified: Secondary | ICD-10-CM | POA: Diagnosis not present

## 2019-07-21 DIAGNOSIS — E785 Hyperlipidemia, unspecified: Secondary | ICD-10-CM | POA: Diagnosis not present

## 2019-07-21 DIAGNOSIS — I10 Essential (primary) hypertension: Secondary | ICD-10-CM | POA: Diagnosis not present

## 2019-07-21 NOTE — Progress Notes (Signed)
History of Present Illness  There is no documented history at this time  Assessments & Plan   There are no diagnoses linked to this encounter.    Additional instructions  Subjective:  Patient presents with venous ulcer of the Bilateral lower extremity.    Procedure:  3 layer unna wrap was placed Bilateral lower extremity.   Plan:   Follow up in one week.  

## 2019-07-24 ENCOUNTER — Encounter (INDEPENDENT_AMBULATORY_CARE_PROVIDER_SITE_OTHER): Payer: Self-pay | Admitting: Nurse Practitioner

## 2019-07-28 ENCOUNTER — Encounter (INDEPENDENT_AMBULATORY_CARE_PROVIDER_SITE_OTHER): Payer: Self-pay | Admitting: Nurse Practitioner

## 2019-07-28 ENCOUNTER — Ambulatory Visit (INDEPENDENT_AMBULATORY_CARE_PROVIDER_SITE_OTHER): Payer: Medicare HMO | Admitting: Nurse Practitioner

## 2019-07-28 ENCOUNTER — Other Ambulatory Visit: Payer: Self-pay

## 2019-07-28 VITALS — BP 138/72 | HR 72 | Wt 277.0 lb

## 2019-07-28 DIAGNOSIS — L97321 Non-pressure chronic ulcer of left ankle limited to breakdown of skin: Secondary | ICD-10-CM

## 2019-07-28 DIAGNOSIS — L97211 Non-pressure chronic ulcer of right calf limited to breakdown of skin: Secondary | ICD-10-CM

## 2019-07-28 DIAGNOSIS — I10 Essential (primary) hypertension: Secondary | ICD-10-CM | POA: Diagnosis not present

## 2019-07-28 DIAGNOSIS — G629 Polyneuropathy, unspecified: Secondary | ICD-10-CM | POA: Insufficient documentation

## 2019-07-28 DIAGNOSIS — I890 Lymphedema, not elsewhere classified: Secondary | ICD-10-CM

## 2019-07-28 DIAGNOSIS — E785 Hyperlipidemia, unspecified: Secondary | ICD-10-CM | POA: Insufficient documentation

## 2019-07-28 DIAGNOSIS — K219 Gastro-esophageal reflux disease without esophagitis: Secondary | ICD-10-CM | POA: Insufficient documentation

## 2019-07-28 DIAGNOSIS — E119 Type 2 diabetes mellitus without complications: Secondary | ICD-10-CM | POA: Insufficient documentation

## 2019-07-28 MED ORDER — MUPIROCIN 2 % EX OINT: TOPICAL_OINTMENT | CUTANEOUS | 0 refills | Status: DC

## 2019-07-31 ENCOUNTER — Encounter (INDEPENDENT_AMBULATORY_CARE_PROVIDER_SITE_OTHER): Payer: Self-pay | Admitting: Nurse Practitioner

## 2019-07-31 NOTE — Progress Notes (Signed)
Subjective:    Patient ID: [name], female    DOB: [birth year], 74 y.o.   MRN: [id number] No chief complaint on file.   The patient returns today for interim check.  The patient was previously in bilateral Unna wraps due to bilateral shallow ulcerations.  Today the ulceration on her right lower extremity is completely healed however the one on her left is mostly healed.  The patient has tolerated the Unna wraps well.  She denies any fever, chills, nausea, vomiting or diarrhea.  The patient continues to elevate her lower extremities as much as possible as well as walk when possible.  She also endorses having decreased her salt intake.   Review of Systems  Cardiovascular: Positive for leg swelling.  Skin: Positive for wound.  Neurological: Positive for weakness.  All other systems reviewed and are negative.      Objective:   Physical Exam Vitals reviewed.  Constitutional:      Appearance: She is obese.  Cardiovascular:     Rate and Rhythm: Normal rate and regular rhythm.     Pulses: Normal pulses.     Heart sounds: Normal heart sounds.  Pulmonary:     Effort: Pulmonary effort is normal.     Breath sounds: Normal breath sounds.  Musculoskeletal:     Right lower leg: Edema present.     Left lower leg: Edema present.  Skin:    Comments: Mostly scabbed ulceration on left lower extremity  Neurological:     Mental Status: She is alert and oriented to person, place, and time.     Motor: Weakness present.  Psychiatric:        Mood and Affect: Mood normal.        Behavior: Behavior normal.        Thought Content: Thought content normal.        Judgment: Judgment normal.     BP 138/72   Pulse 72   Wt 277 lb (125.6 kg)   BMI 43.38 kg/m   Past Medical History:  Diagnosis Date  . Anemia    as young child  . Arthritis    back, leg and foot  . Blood transfusion    1964  . Carpal tunnel syndrome   . Diabetes mellitus    oral medications  . Hypertension     Social  History   Socioeconomic History  . Marital status: Married    Spouse name: Not on file  . Number of children: Not on file  . Years of education: Not on file  . Highest education level: Not on file  Occupational History  . Not on file  Tobacco Use  . Smoking status: Never Smoker  . Smokeless tobacco: Never Used  Substance and Sexual Activity  . Alcohol use: No  . Drug use: No  . Sexual activity: Not on file  Other Topics Concern  . Not on file  Social History Narrative  . Not on file   Social Determinants of Health   Financial Resource Strain:   . Difficulty of Paying Living Expenses:   Food Insecurity:   . Worried About Charity fundraiser in the Last Year:   . Arboriculturist in the Last Year:   Transportation Needs:   . Film/video editor (Medical):   Marland Kitchen Lack of Transportation (Non-Medical):   Physical Activity:   . Days of Exercise per Week:   . Minutes of Exercise per Session:   Stress:   .  Feeling of Stress :   Social Connections:   . Frequency of Communication with Friends and Family:   . Frequency of Social Gatherings with Friends and Family:   . Attends Religious Services:   . Active Member of Clubs or Organizations:   . Attends Banker Meetings:   Marland Kitchen Marital Status:   Intimate Partner Violence:   . Fear of Current or Ex-Partner:   . Emotionally Abused:   Marland Kitchen Physically Abused:   . Sexually Abused:     Past Surgical History:  Procedure Laterality Date  . ABDOMINAL HYSTERECTOMY    . ANTERIOR CERVICAL DECOMP/DISCECTOMY FUSION  04/27/2011   Procedure: ANTERIOR CERVICAL DECOMPRESSION/DISCECTOMY FUSION 2 LEVELS;  Surgeon: Mariam Dollar, MD;  Location: [facility];  Service: Neurosurgery;  Laterality: N/A;  Anterior Cervical Decompression/ Discectomy Fusion Cervical Three-Four, Cervical Four-Five  . APPENDECTOMY    . KNEE ARTHROPLASTY     right  . PERIPHERAL VASCULAR CATHETERIZATION Left 10/29/2014   Procedure: Lower Extremity Angiography;   Surgeon: Annice Needy, MD;  Location: [facility];  Service: Cardiovascular;  Laterality: Left;  . PERIPHERAL VASCULAR CATHETERIZATION Left 10/29/2014   Procedure: Lower Extremity Intervention;  Surgeon: Annice Needy, MD;  Location: [facility];  Service: Cardiovascular;  Laterality: Left;  . PERIPHERAL VASCULAR CATHETERIZATION Left 03/11/2015   Procedure: Lower Extremity Angiography;  Surgeon: Annice Needy, MD;  Location: [facility];  Service: Cardiovascular;  Laterality: Left;  . PERIPHERAL VASCULAR CATHETERIZATION Left 03/11/2015   Procedure: Lower Extremity Intervention;  Surgeon: Annice Needy, MD;  Location: [facility];  Service: Cardiovascular;  Laterality: Left;    Family History  Problem Relation Age of Onset  . Cancer Mother   . Hypertension Mother   . Diabetes Paternal Grandmother     Allergies  Allergen Reactions  . Penicillins Shortness Of Breath and Swelling  . Codeine Other (See Comments)    Ringing in ear       Assessment & Plan:   1. Lower limb ulcer, calf, right, limited to breakdown of skin (HCC) Today the right lower extremity ulceration has healed.  We will take the patient out of Unna wraps and have her transition to medical grade 1 compression.  Patient is advised to continue with the conservative therapy as outlined below.  2. Lymphedema The patient will continue with conservative therapies including elevation of her bilateral lower extremities, exercise and maintaining a sodium restriction.  The patient will return to medical grade 1 compression stockings on her right lower extremity however her left will remain in in wraps.  3. Essential hypertension, benign Continue antihypertensive medications as already ordered, these medications have been reviewed and there are no changes at this time.   4. Lower limb ulcer, ankle, left, limited to breakdown of skin (HCC) Patient's ulceration at this time is mostly healed.  We will place  the patient back in orders to be changed on a weekly basis.   Current Outpatient Medications on File Prior to Visit  Medication Sig Dispense Refill  . aspirin EC 81 MG tablet Take 81 mg by mouth daily.    . celecoxib (CELEBREX) 200 MG capsule Take 200 mg by mouth daily.    . clopidogrel (PLAVIX) 75 MG tablet Take 1 tablet (75 mg total) by mouth daily. 30 tablet 11  . cyclobenzaprine (FLEXERIL) 10 MG tablet Take 10 mg by mouth 3 (three) times daily as needed. For muscle pain    .  furosemide (LASIX) 20 MG tablet Take 20 mg by mouth daily.    Marland Kitchen glipiZIDE (GLUCOTROL XL) 5 MG 24 hr tablet Take 5 mg by mouth 2 (two) times daily.    . Liraglutide (VICTOZA) 18 MG/3ML SOPN Inject into the skin.    Marland Kitchen lisinopril-hydrochlorothiazide (PRINZIDE,ZESTORETIC) 20-25 MG per tablet Take 1 tablet by mouth daily.    . metFORMIN (GLUCOPHAGE) 1000 MG tablet Take 1,000 mg by mouth 2 (two) times daily with a meal.    . metoprolol succinate (TOPROL-XL) 25 MG 24 hr tablet Take 25 mg by mouth daily.    . naproxen (NAPROSYN) 375 MG tablet Take 375 mg by mouth 3 (three) times daily with meals.    Marland Kitchen omeprazole (PRILOSEC) 20 MG capsule Take 20 mg by mouth daily.    . predniSONE (DELTASONE) 10 MG tablet Take 10-40 mg by mouth daily. Reported on 03/11/2015    . pregabalin (LYRICA) 25 MG capsule Take 50 mg by mouth 3 (three) times daily.     . rosuvastatin (CRESTOR) 5 MG tablet Take 5 mg by mouth daily.    . saxagliptin HCl (ONGLYZA) 5 MG TABS tablet Take 5 mg by mouth daily.     No current facility-administered medications on file prior to visit.    There are no Patient Instructions on file for this visit. No follow-ups on file.   Georgiana Spinner, NP

## 2019-08-04 ENCOUNTER — Other Ambulatory Visit: Payer: Self-pay

## 2019-08-04 ENCOUNTER — Encounter (INDEPENDENT_AMBULATORY_CARE_PROVIDER_SITE_OTHER): Payer: Self-pay

## 2019-08-04 ENCOUNTER — Ambulatory Visit (INDEPENDENT_AMBULATORY_CARE_PROVIDER_SITE_OTHER): Payer: Medicare HMO | Admitting: Nurse Practitioner

## 2019-08-04 VITALS — BP 117/59 | HR 72 | Resp 16

## 2019-08-04 DIAGNOSIS — L97211 Non-pressure chronic ulcer of right calf limited to breakdown of skin: Secondary | ICD-10-CM | POA: Diagnosis not present

## 2019-08-04 NOTE — Progress Notes (Signed)
History of Present Illness  There is no documented history at this time  Assessments & Plan   There are no diagnoses linked to this encounter.    Additional instructions  Subjective:  Patient presents with venous ulcer of the Bilateral lower extremity.    Procedure:  3 layer unna wrap was placed Bilateral lower extremity.   Plan:   Follow up in one week.  

## 2019-08-07 ENCOUNTER — Encounter (INDEPENDENT_AMBULATORY_CARE_PROVIDER_SITE_OTHER): Payer: Self-pay | Admitting: Nurse Practitioner

## 2019-08-11 ENCOUNTER — Encounter (INDEPENDENT_AMBULATORY_CARE_PROVIDER_SITE_OTHER): Payer: Self-pay | Admitting: Nurse Practitioner

## 2019-08-11 ENCOUNTER — Other Ambulatory Visit: Payer: Self-pay

## 2019-08-11 ENCOUNTER — Ambulatory Visit (INDEPENDENT_AMBULATORY_CARE_PROVIDER_SITE_OTHER): Payer: Medicare HMO | Admitting: Nurse Practitioner

## 2019-08-11 VITALS — BP 104/58 | HR 75 | Resp 17 | Ht 68.0 in | Wt 288.0 lb

## 2019-08-11 DIAGNOSIS — L97211 Non-pressure chronic ulcer of right calf limited to breakdown of skin: Secondary | ICD-10-CM | POA: Diagnosis not present

## 2019-08-11 NOTE — Progress Notes (Signed)
History of Present Illness  There is no documented history at this time  Assessments & Plan   There are no diagnoses linked to this encounter.    Additional instructions  Subjective:  Patient presents with venous ulcer of the Left lower extremity.    Procedure:  3 layer unna wrap was placed Left lower extremity.   Plan:   Follow up in one week.  

## 2019-08-17 DIAGNOSIS — R69 Illness, unspecified: Secondary | ICD-10-CM | POA: Diagnosis not present

## 2019-08-18 ENCOUNTER — Ambulatory Visit (INDEPENDENT_AMBULATORY_CARE_PROVIDER_SITE_OTHER): Payer: Medicare HMO | Admitting: Vascular Surgery

## 2019-08-18 ENCOUNTER — Encounter (INDEPENDENT_AMBULATORY_CARE_PROVIDER_SITE_OTHER): Payer: Self-pay

## 2019-08-18 ENCOUNTER — Other Ambulatory Visit: Payer: Self-pay

## 2019-08-18 VITALS — BP 117/68 | HR 76 | Resp 16

## 2019-08-18 DIAGNOSIS — I890 Lymphedema, not elsewhere classified: Secondary | ICD-10-CM

## 2019-08-18 DIAGNOSIS — I7025 Atherosclerosis of native arteries of other extremities with ulceration: Secondary | ICD-10-CM

## 2019-08-18 DIAGNOSIS — L97211 Non-pressure chronic ulcer of right calf limited to breakdown of skin: Secondary | ICD-10-CM

## 2019-08-18 NOTE — Progress Notes (Signed)
History of Present Illness  Leg swelling, ulceration  Assessments & Plan   There are no diagnoses linked to this encounter.    Additional instructions  Subjective:  Patient presents with venous ulcer of Bilateral lower extremities    Procedure:  3 layer unna wrap was placed on Bilateral lower extremities.   Plan:   Follow up in one week.

## 2019-08-25 ENCOUNTER — Ambulatory Visit (INDEPENDENT_AMBULATORY_CARE_PROVIDER_SITE_OTHER): Payer: Medicare HMO | Admitting: Nurse Practitioner

## 2019-08-25 ENCOUNTER — Other Ambulatory Visit: Payer: Self-pay

## 2019-08-25 ENCOUNTER — Encounter (INDEPENDENT_AMBULATORY_CARE_PROVIDER_SITE_OTHER): Payer: Self-pay | Admitting: Nurse Practitioner

## 2019-08-25 VITALS — BP 113/73 | HR 80 | Resp 16

## 2019-08-25 DIAGNOSIS — I10 Essential (primary) hypertension: Secondary | ICD-10-CM | POA: Diagnosis not present

## 2019-08-25 DIAGNOSIS — L97211 Non-pressure chronic ulcer of right calf limited to breakdown of skin: Secondary | ICD-10-CM | POA: Diagnosis not present

## 2019-08-25 DIAGNOSIS — I890 Lymphedema, not elsewhere classified: Secondary | ICD-10-CM

## 2019-08-28 ENCOUNTER — Encounter (INDEPENDENT_AMBULATORY_CARE_PROVIDER_SITE_OTHER): Payer: Self-pay | Admitting: Nurse Practitioner

## 2019-08-28 NOTE — Progress Notes (Signed)
Subjective:    Patient ID: [name], female    DOB: February 19, 1945, 74 y.o.   MRN: [id number] Chief Complaint  Patient presents with  . Follow-up    unna check    The patient returns today for evaluation of her bilateral lower extremities after several episodes of ulceration and several weeks of Unna wraps.  Today the patient has completely healed wounds and the patient's lower extremity edema is well controlled.  The patient notes that her lower extremities feel much better.  The patient has been utilizing medical grade 1 compression stockings at home.  She also elevates her lower extremities.  Unfortunately the patient is not able to walk for extended periods she denies any fever, chills, nausea, vomiting or diarrhea.  Overall the the patient is feeling much better.   Review of Systems  Cardiovascular: Positive for leg swelling.  Neurological: Positive for weakness.  All other systems reviewed and are negative.      Objective:   Physical Exam Vitals reviewed.  Constitutional:      Appearance: She is obese.  HENT:     Head: Normocephalic.  Cardiovascular:     Rate and Rhythm: Normal rate and regular rhythm.     Pulses: Normal pulses.     Heart sounds: Normal heart sounds.  Pulmonary:     Effort: Pulmonary effort is normal.     Breath sounds: Normal breath sounds.  Neurological:     Mental Status: She is alert and oriented to person, place, and time.     Motor: Weakness present.     Gait: Gait abnormal.  Psychiatric:        Mood and Affect: Mood normal.        Behavior: Behavior normal.        Thought Content: Thought content normal.        Judgment: Judgment normal.     BP 113/73 (BP Location: Left Arm)   Pulse 80   Resp 16   Past Medical History:  Diagnosis Date  . Anemia    as young child  . Arthritis    back, leg and foot  . Blood transfusion    1964  . Carpal tunnel syndrome   . Diabetes mellitus    oral medications  . Hypertension     Social  History   Socioeconomic History  . Marital status: Married    Spouse name: Not on file  . Number of children: Not on file  . Years of education: Not on file  . Highest education level: Not on file  Occupational History  . Not on file  Tobacco Use  . Smoking status: Never Smoker  . Smokeless tobacco: Never Used  Substance and Sexual Activity  . Alcohol use: No  . Drug use: No  . Sexual activity: Not on file  Other Topics Concern  . Not on file  Social History Narrative  . Not on file   Social Determinants of Health   Financial Resource Strain:   . Difficulty of Paying Living Expenses:   Food Insecurity:   . Worried About Programme researcher, broadcasting/film/video in the Last Year:   . Barista in the Last Year:   Transportation Needs:   . Freight forwarder (Medical):   Marland Kitchen Lack of Transportation (Non-Medical):   Physical Activity:   . Days of Exercise per Week:   . Minutes of Exercise per Session:   Stress:   . Feeling of Stress :  Social Connections:   . Frequency of Communication with Friends and Family:   . Frequency of Social Gatherings with Friends and Family:   . Attends Religious Services:   . Active Member of Clubs or Organizations:   . Attends Banker Meetings:   Marland Kitchen Marital Status:   Intimate Partner Violence:   . Fear of Current or Ex-Partner:   . Emotionally Abused:   Marland Kitchen Physically Abused:   . Sexually Abused:     Past Surgical History:  Procedure Laterality Date  . ABDOMINAL HYSTERECTOMY    . ANTERIOR CERVICAL DECOMP/DISCECTOMY FUSION  04/27/2011   Procedure: ANTERIOR CERVICAL DECOMPRESSION/DISCECTOMY FUSION 2 LEVELS;  Surgeon: Mariam Dollar, MD;  Location: [facility];  Service: Neurosurgery;  Laterality: N/A;  Anterior Cervical Decompression/ Discectomy Fusion Cervical Three-Four, Cervical Four-Five  . APPENDECTOMY    . KNEE ARTHROPLASTY     right  . PERIPHERAL VASCULAR CATHETERIZATION Left 10/29/2014   Procedure: Lower Extremity Angiography;   Surgeon: Annice Needy, MD;  Location: [facility];  Service: Cardiovascular;  Laterality: Left;  . PERIPHERAL VASCULAR CATHETERIZATION Left 10/29/2014   Procedure: Lower Extremity Intervention;  Surgeon: Annice Needy, MD;  Location: [facility];  Service: Cardiovascular;  Laterality: Left;  . PERIPHERAL VASCULAR CATHETERIZATION Left 03/11/2015   Procedure: Lower Extremity Angiography;  Surgeon: Annice Needy, MD;  Location: [facility];  Service: Cardiovascular;  Laterality: Left;  . PERIPHERAL VASCULAR CATHETERIZATION Left 03/11/2015   Procedure: Lower Extremity Intervention;  Surgeon: Annice Needy, MD;  Location: [facility];  Service: Cardiovascular;  Laterality: Left;    Family History  Problem Relation Age of Onset  . Cancer Mother   . Hypertension Mother   . Diabetes Paternal Grandmother     Allergies  Allergen Reactions  . Penicillins Shortness Of Breath and Swelling  . Codeine Other (See Comments)    Ringing in ear       Assessment & Plan:   1. Lymphedema I have had a long discussion with the patient regarding swelling and why it  causes symptoms.  Patient will begin wearing graduated compression stockings class 1 (20-30 mmHg) on a daily basis a prescription was given. The patient will  beginning wearing the stockings first thing in the morning and removing them in the evening. The patient is instructed specifically not to sleep in the stockings.   In addition, behavioral modification will be initiated.  This will include frequent elevation, use of over the counter pain medications and exercise such as walking.  I have reviewed systemic causes for chronic edema such as liver, kidney and cardiac etiologies.  The patient denies problems with these organ systems.    Consideration for a lymph pump will also be made based upon the effectiveness of conservative therapy.  Patient will follow up in 3 months to reevaluate lower extremity edema    2.  Lower limb ulcer, calf, right, limited to breakdown of skin (HCC) The patient's ulceration is healed at this time.  The patient will transition to medical grade 1 compression stockings.  3. Essential hypertension Continue antihypertensive medications as already ordered, these medications have been reviewed and there are no changes at this time.    Current Outpatient Medications on File Prior to Visit  Medication Sig Dispense Refill  . aspirin EC 81 MG tablet Take 81 mg by mouth daily.    . celecoxib (CELEBREX) 200 MG capsule Take 200 mg by mouth daily.    Marland Kitchen  clopidogrel (PLAVIX) 75 MG tablet Take 1 tablet (75 mg total) by mouth daily. 30 tablet 11  . furosemide (LASIX) 20 MG tablet Take 20 mg by mouth daily.    Marland Kitchen glipiZIDE (GLUCOTROL XL) 5 MG 24 hr tablet Take 5 mg by mouth 2 (two) times daily.    . Liraglutide (VICTOZA) 18 MG/3ML SOPN Inject into the skin.    Marland Kitchen lisinopril-hydrochlorothiazide (PRINZIDE,ZESTORETIC) 20-25 MG per tablet Take 1 tablet by mouth daily.    . metFORMIN (GLUCOPHAGE) 1000 MG tablet Take 1,000 mg by mouth 2 (two) times daily with a meal.    . mupirocin ointment (BACTROBAN) 2 % Place 1 application on wound daily 22 g 0  . pregabalin (LYRICA) 25 MG capsule Take 50 mg by mouth 3 (three) times daily.     . rosuvastatin (CRESTOR) 5 MG tablet Take 5 mg by mouth daily.     No current facility-administered medications on file prior to visit.    There are no Patient Instructions on file for this visit. No follow-ups on file.   Georgiana Spinner, NP

## 2019-10-09 ENCOUNTER — Telehealth (INDEPENDENT_AMBULATORY_CARE_PROVIDER_SITE_OTHER): Payer: Self-pay | Admitting: Vascular Surgery

## 2019-10-09 NOTE — Telephone Encounter (Signed)
Called stating that her legs have a stinging sensation, whelps and an aching pain. Patient was last seen 08-18-19 (unna boot). She would like to come in to be seen. I talked to one of the nurses (AP) and she advised me to add her to the schedule for tomorrow. This note is for documentation purposes only.

## 2019-10-10 ENCOUNTER — Other Ambulatory Visit: Payer: Self-pay

## 2019-10-10 ENCOUNTER — Encounter (INDEPENDENT_AMBULATORY_CARE_PROVIDER_SITE_OTHER): Payer: Self-pay | Admitting: Nurse Practitioner

## 2019-10-10 ENCOUNTER — Ambulatory Visit (INDEPENDENT_AMBULATORY_CARE_PROVIDER_SITE_OTHER): Payer: Medicare HMO | Admitting: Nurse Practitioner

## 2019-10-10 VITALS — BP 143/77 | HR 80 | Ht 66.0 in | Wt 268.0 lb

## 2019-10-10 DIAGNOSIS — L97211 Non-pressure chronic ulcer of right calf limited to breakdown of skin: Secondary | ICD-10-CM | POA: Diagnosis not present

## 2019-10-10 DIAGNOSIS — I10 Essential (primary) hypertension: Secondary | ICD-10-CM

## 2019-10-10 DIAGNOSIS — L03119 Cellulitis of unspecified part of limb: Secondary | ICD-10-CM | POA: Diagnosis not present

## 2019-10-10 MED ORDER — SULFAMETHOXAZOLE-TRIMETHOPRIM 800-160 MG PO TABS: 1.0000 | ORAL_TABLET | Freq: Two times a day (BID) | ORAL | 0 refills | Status: DC

## 2019-10-10 MED ORDER — TRAMADOL HCL 50 MG PO TABS: 50.0000 mg | ORAL_TABLET | Freq: Four times a day (QID) | ORAL | 0 refills | Status: DC | PRN

## 2019-10-10 NOTE — Progress Notes (Signed)
Subjective:    Patient ID: [name], female    DOB: 18-Mar-1945, 74 y.o.   MRN: [id number] Chief Complaint  Patient presents with  . Follow-up    add on per phone note    Patient presents today after contacting our office yesterday in regards to lower extremity wounds and weeping.  The patient was previously in the wraps for lower extremity ulcerations and removed approximately 2 months ago.  The patient notes that she has been having severe leg pain and soreness that she describes as a sharp burning sensation.  Patient also notes the development of blisters on her right lower extremity.  She denies any fever, chills, nausea, vomiting or diarrhea.  She denies any chest pain or shortness of breath.  The patient's biggest complaint is that her legs are very painful and she is unable to rest due to the pain and discomfort.   Review of Systems  Cardiovascular: Positive for leg swelling.  Skin: Positive for rash and wound.  All other systems reviewed and are negative.      Objective:   Physical Exam Vitals reviewed.  Constitutional:      Appearance: She is obese.  HENT:     Head: Normocephalic.  Cardiovascular:     Rate and Rhythm: Normal rate and regular rhythm.     Pulses: Normal pulses.  Pulmonary:     Effort: Pulmonary effort is normal.     Breath sounds: Normal breath sounds.  Skin:    Findings: Erythema and rash present.     Comments: Bilateral cellulitis with blisters on the right lower extremity  Neurological:     Mental Status: She is alert and oriented to person, place, and time.     Motor: Weakness present.     Gait: Gait abnormal.  Psychiatric:        Mood and Affect: Mood normal.        Behavior: Behavior normal.        Thought Content: Thought content normal.        Judgment: Judgment normal.     BP (!) 143/77   Pulse 80   Ht 5\' 6"  (1.676 m)   Wt 268 lb (121.6 kg)   BMI 43.26 kg/m   Past Medical History:  Diagnosis Date  . Anemia    as young  child  . Arthritis    back, leg and foot  . Blood transfusion    1964  . Carpal tunnel syndrome   . Diabetes mellitus    oral medications  . Hypertension     Social History   Socioeconomic History  . Marital status: Married    Spouse name: Not on file  . Number of children: Not on file  . Years of education: Not on file  . Highest education level: Not on file  Occupational History  . Not on file  Tobacco Use  . Smoking status: Never Smoker  . Smokeless tobacco: Never Used  Substance and Sexual Activity  . Alcohol use: No  . Drug use: No  . Sexual activity: Not on file  Other Topics Concern  . Not on file  Social History Narrative  . Not on file   Social Determinants of Health   Financial Resource Strain:   . Difficulty of Paying Living Expenses: Not on file  Food Insecurity:   . Worried About in the Last Year: Not on file  . Ran Out of Food in the Last Year:  Not on file  Transportation Needs:   . Lack of Transportation (Medical): Not on file  . Lack of Transportation (Non-Medical): Not on file  Physical Activity:   . Days of Exercise per Week: Not on file  . Minutes of Exercise per Session: Not on file  Stress:   . Feeling of Stress : Not on file  Social Connections:   . Frequency of Communication with Friends and Family: Not on file  . Frequency of Social Gatherings with Friends and Family: Not on file  . Attends Religious Services: Not on file  . Active Member of Clubs or Organizations: Not on file  . Attends Banker Meetings: Not on file  . Marital Status: Not on file  Intimate Partner Violence:   . Fear of Current or Ex-Partner: Not on file  . Emotionally Abused: Not on file  . Physically Abused: Not on file  . Sexually Abused: Not on file    Past Surgical History:  Procedure Laterality Date  . ABDOMINAL HYSTERECTOMY    . ANTERIOR CERVICAL DECOMP/DISCECTOMY FUSION  04/27/2011   Procedure: ANTERIOR CERVICAL  DECOMPRESSION/DISCECTOMY FUSION 2 LEVELS;  Surgeon: Mariam Dollar, MD;  Location: [facility];  Service: Neurosurgery;  Laterality: N/A;  Anterior Cervical Decompression/ Discectomy Fusion Cervical Three-Four, Cervical Four-Five  . APPENDECTOMY    . KNEE ARTHROPLASTY     right  . PERIPHERAL VASCULAR CATHETERIZATION Left 10/29/2014   Procedure: Lower Extremity Angiography;  Surgeon: Annice Needy, MD;  Location: [facility];  Service: Cardiovascular;  Laterality: Left;  . PERIPHERAL VASCULAR CATHETERIZATION Left 10/29/2014   Procedure: Lower Extremity Intervention;  Surgeon: Annice Needy, MD;  Location: [facility];  Service: Cardiovascular;  Laterality: Left;  . PERIPHERAL VASCULAR CATHETERIZATION Left 03/11/2015   Procedure: Lower Extremity Angiography;  Surgeon: Annice Needy, MD;  Location: [facility];  Service: Cardiovascular;  Laterality: Left;  . PERIPHERAL VASCULAR CATHETERIZATION Left 03/11/2015   Procedure: Lower Extremity Intervention;  Surgeon: Annice Needy, MD;  Location: [facility];  Service: Cardiovascular;  Laterality: Left;    Family History  Problem Relation Age of Onset  . Cancer Mother   . Hypertension Mother   . Diabetes Paternal Grandmother     Allergies  Allergen Reactions  . Penicillins Shortness Of Breath and Swelling  . Codeine Other (See Comments)    Ringing in ear       Assessment & Plan:   1. Lower limb ulcer, calf, right, limited to breakdown of skin (HCC) The patient will be placed in bilateral Unna wraps to be changed on a week basis.  The patient is instructed to elevate her lower extremities as much as possible to help with swelling.  The patient will have her wraps changed in office on a weekly basis.  We will also evaluate the patient's lower extremity edema in 4 weeks.  2. Cellulitis of lower extremity, unspecified laterality Patient's lower extremity pain and discomfort is very likely related to cellulitis.  I have  sent in the patient a prescription for antibiotics as well as for some pain relief until she begins to heal.  The patient's bilateral Unna wraps will be important for gaining control of her cellulitis as well. - sulfamethoxazole-trimethoprim (BACTRIM DS) 800-160 MG tablet; Take 1 tablet by mouth 2 (two) times daily.  Dispense: 20 tablet; Refill: 0 - traMADol (ULTRAM) 50 MG tablet; Take 1 tablet (50 mg total) by mouth every 6 (six) hours  as needed.  Dispense: 20 tablet; Refill: 0  3. Benign essential hypertension Continue antihypertensive medications as already ordered, these medications have been reviewed and there are no changes at this time.    Current Outpatient Medications on File Prior to Visit  Medication Sig Dispense Refill  . aspirin EC 81 MG tablet Take 81 mg by mouth daily.    . celecoxib (CELEBREX) 200 MG capsule Take 200 mg by mouth daily.    . clopidogrel (PLAVIX) 75 MG tablet Take 1 tablet (75 mg total) by mouth daily. 30 tablet 11  . furosemide (LASIX) 20 MG tablet Take 20 mg by mouth daily.    Marland Kitchen glipiZIDE (GLUCOTROL XL) 5 MG 24 hr tablet Take 5 mg by mouth 2 (two) times daily.    . Liraglutide (VICTOZA) 18 MG/3ML SOPN Inject into the skin.    Marland Kitchen lisinopril-hydrochlorothiazide (PRINZIDE,ZESTORETIC) 20-25 MG per tablet Take 1 tablet by mouth daily.    . metFORMIN (GLUCOPHAGE) 1000 MG tablet Take 1,000 mg by mouth 2 (two) times daily with a meal.    . pregabalin (LYRICA) 25 MG capsule Take 50 mg by mouth 3 (three) times daily.     . rosuvastatin (CRESTOR) 5 MG tablet Take 5 mg by mouth daily.    . mupirocin ointment (BACTROBAN) 2 % Place 1 application on wound daily (Patient not taking: Reported on 10/10/2019) 22 g 0   No current facility-administered medications on file prior to visit.    There are no Patient Instructions on file for this visit. No follow-ups on file.   Georgiana Spinner, NP

## 2019-10-17 ENCOUNTER — Other Ambulatory Visit: Payer: Self-pay

## 2019-10-17 ENCOUNTER — Encounter (INDEPENDENT_AMBULATORY_CARE_PROVIDER_SITE_OTHER): Payer: Self-pay

## 2019-10-17 ENCOUNTER — Ambulatory Visit (INDEPENDENT_AMBULATORY_CARE_PROVIDER_SITE_OTHER): Payer: Medicare HMO | Admitting: Nurse Practitioner

## 2019-10-17 VITALS — BP 105/71 | HR 73 | Resp 16

## 2019-10-17 DIAGNOSIS — L97211 Non-pressure chronic ulcer of right calf limited to breakdown of skin: Secondary | ICD-10-CM

## 2019-10-17 NOTE — Progress Notes (Signed)
History of Present Illness  There is no documented history at this time  Assessments & Plan   There are no diagnoses linked to this encounter.    Additional instructions  Subjective:  Patient presents with venous ulcer of the Bilateral lower extremity.    Procedure:  3 layer unna wrap was placed Bilateral lower extremity.   Plan:   Follow up in one week.  

## 2019-10-24 ENCOUNTER — Ambulatory Visit (INDEPENDENT_AMBULATORY_CARE_PROVIDER_SITE_OTHER): Payer: Medicare HMO | Admitting: Nurse Practitioner

## 2019-10-24 ENCOUNTER — Other Ambulatory Visit: Payer: Self-pay

## 2019-10-24 ENCOUNTER — Encounter (INDEPENDENT_AMBULATORY_CARE_PROVIDER_SITE_OTHER): Payer: Self-pay | Admitting: Nurse Practitioner

## 2019-10-24 VITALS — BP 146/74 | HR 68 | Ht 68.0 in | Wt 250.0 lb

## 2019-10-24 DIAGNOSIS — L97211 Non-pressure chronic ulcer of right calf limited to breakdown of skin: Secondary | ICD-10-CM

## 2019-10-24 NOTE — Progress Notes (Signed)
History of Present Illness  There is no documented history at this time  Assessments & Plan   There are no diagnoses linked to this encounter.    Additional instructions  Subjective:  Patient presents with venous ulcer of the Bilateral lower extremity.    Procedure:  3 layer unna wrap was placed Bilateral lower extremity.   Plan:   Follow up in one week.  

## 2019-10-31 ENCOUNTER — Encounter (INDEPENDENT_AMBULATORY_CARE_PROVIDER_SITE_OTHER): Payer: Self-pay | Admitting: Nurse Practitioner

## 2019-10-31 ENCOUNTER — Ambulatory Visit (INDEPENDENT_AMBULATORY_CARE_PROVIDER_SITE_OTHER): Payer: Medicare HMO | Admitting: Nurse Practitioner

## 2019-10-31 ENCOUNTER — Other Ambulatory Visit: Payer: Self-pay

## 2019-10-31 VITALS — BP 174/74 | HR 68 | Resp 18 | Ht 68.0 in | Wt 250.0 lb

## 2019-10-31 DIAGNOSIS — I10 Essential (primary) hypertension: Secondary | ICD-10-CM | POA: Diagnosis not present

## 2019-10-31 DIAGNOSIS — I890 Lymphedema, not elsewhere classified: Secondary | ICD-10-CM

## 2019-10-31 DIAGNOSIS — L97211 Non-pressure chronic ulcer of right calf limited to breakdown of skin: Secondary | ICD-10-CM

## 2019-11-05 ENCOUNTER — Encounter (INDEPENDENT_AMBULATORY_CARE_PROVIDER_SITE_OTHER): Payer: Self-pay | Admitting: Nurse Practitioner

## 2019-11-05 NOTE — Progress Notes (Signed)
Subjective:    Patient ID: [name], female    DOB: July 30, 1945, 74 y.o.   MRN: [id number] Chief Complaint  Patient presents with  . Follow-up    Patient is seen for follow up evaluation of leg pain and swelling associated with venous ulceration. The patient was recently seen here and started on Unna boot therapy.  The venous ulcerations are currently healed at this time.  There is no worsening open wounds or new wounds present.  All wounds are completely healed.  The swelling is also in under control.  The patient denies any fever, chills, nausea, vomiting or diarrhea.  She denies any chest pain or shortness of breath   Review of Systems  Cardiovascular: Positive for leg swelling.  Neurological: Positive for weakness.       Objective:   Physical Exam Vitals reviewed.  HENT:     Head: Normocephalic.  Cardiovascular:     Rate and Rhythm: Normal rate.     Pulses: Normal pulses.  Pulmonary:     Effort: Pulmonary effort is normal.  Musculoskeletal:     Right lower leg: Edema present.     Left lower leg: Edema present.  Neurological:     Mental Status: She is alert and oriented to person, place, and time.  Psychiatric:        Mood and Affect: Mood normal.        Behavior: Behavior normal.        Thought Content: Thought content normal.        Judgment: Judgment normal.     BP (!) 174/74 (BP Location: Right Arm)   Pulse 68   Resp 18   Ht 5\' 8"  (1.727 m)   Wt 250 lb (113.4 kg)   BMI 38.01 kg/m   Past Medical History:  Diagnosis Date  . Anemia    as young child  . Arthritis    back, leg and foot  . Blood transfusion    1964  . Carpal tunnel syndrome   . Diabetes mellitus    oral medications  . Hypertension     Social History   Socioeconomic History  . Marital status: Married    Spouse name: Not on file  . Number of children: Not on file  . Years of education: Not on file  . Highest education level: Not on file  Occupational History  . Not on file    Tobacco Use  . Smoking status: Never Smoker  . Smokeless tobacco: Never Used  Substance and Sexual Activity  . Alcohol use: No  . Drug use: No  . Sexual activity: Not on file  Other Topics Concern  . Not on file  Social History Narrative  . Not on file   Social Determinants of Health   Financial Resource Strain:   . Difficulty of Paying Living Expenses: Not on file  Food Insecurity:   . Worried About in the Last Year: Not on file  . Ran Out of Food in the Last Year: Not on file  Transportation Needs:   . Lack of Transportation (Medical): Not on file  . Lack of Transportation (Non-Medical): Not on file  Physical Activity:   . Days of Exercise per Week: Not on file  . Minutes of Exercise per Session: Not on file  Stress:   . Feeling of Stress : Not on file  Social Connections:   . Frequency of Communication with Friends and Family: Not on file  .  Frequency of Social Gatherings with Friends and Family: Not on file  . Attends Religious Services: Not on file  . Active Member of Clubs or Organizations: Not on file  . Attends Banker Meetings: Not on file  . Marital Status: Not on file  Intimate Partner Violence:   . Fear of Current or Ex-Partner: Not on file  . Emotionally Abused: Not on file  . Physically Abused: Not on file  . Sexually Abused: Not on file    Past Surgical History:  Procedure Laterality Date  . ABDOMINAL HYSTERECTOMY    . ANTERIOR CERVICAL DECOMP/DISCECTOMY FUSION  04/27/2011   Procedure: ANTERIOR CERVICAL DECOMPRESSION/DISCECTOMY FUSION 2 LEVELS;  Surgeon: Mariam Dollar, MD;  Location: [facility];  Service: Neurosurgery;  Laterality: N/A;  Anterior Cervical Decompression/ Discectomy Fusion Cervical Three-Four, Cervical Four-Five  . APPENDECTOMY    . KNEE ARTHROPLASTY     right  . PERIPHERAL VASCULAR CATHETERIZATION Left 10/29/2014   Procedure: Lower Extremity Angiography;  Surgeon: Annice Needy, MD;  Location: [facility];  Service: Cardiovascular;  Laterality: Left;  . PERIPHERAL VASCULAR CATHETERIZATION Left 10/29/2014   Procedure: Lower Extremity Intervention;  Surgeon: Annice Needy, MD;  Location: [facility];  Service: Cardiovascular;  Laterality: Left;  . PERIPHERAL VASCULAR CATHETERIZATION Left 03/11/2015   Procedure: Lower Extremity Angiography;  Surgeon: Annice Needy, MD;  Location: [facility];  Service: Cardiovascular;  Laterality: Left;  . PERIPHERAL VASCULAR CATHETERIZATION Left 03/11/2015   Procedure: Lower Extremity Intervention;  Surgeon: Annice Needy, MD;  Location: [facility];  Service: Cardiovascular;  Laterality: Left;    Family History  Problem Relation Age of Onset  . Cancer Mother   . Hypertension Mother   . Diabetes Paternal Grandmother     Allergies  Allergen Reactions  . Penicillins Shortness Of Breath and Swelling  . Codeine Other (See Comments)    Ringing in ear       Assessment & Plan:   1. Lower limb ulcer, calf, right, limited to breakdown of skin (HCC) Currently resolved patient is advised to continue with conservative therapy as outlined below  2. Lymphedema  No surgery or intervention at this point in time.    I have reviewed my discussion with the patient regarding lymphedema and why it  causes symptoms.  Patient will continue wearing graduated compression stockings class 1 (20-30 mmHg) on a daily basis a prescription was given. The patient is reminded to put the stockings on first thing in the morning and removing them in the evening. The patient is instructed specifically not to sleep in the stockings.   In addition, behavioral modification throughout the day will be continued.  This will include frequent elevation (such as in a recliner), use of over the counter pain medications as needed and exercise such as walking.  I have reviewed systemic causes for chronic edema such as liver, kidney and cardiac etiologies and there  does not appear to be any significant changes in these organ systems over the past year.  The patient is under the impression that these organ systems are all stable and unchanged.    The patient will continue aggressive use of the  lymph pump.  This will continue to improve the edema control and prevent sequela such as ulcers and infections.   The patient will follow-up in 6 months  3. Benign essential hypertension Continue antihypertensive medications as already ordered, these medications have been reviewed and  there are no changes at this time.    Current Outpatient Medications on File Prior to Visit  Medication Sig Dispense Refill  . aspirin EC 81 MG tablet Take 81 mg by mouth daily.    . celecoxib (CELEBREX) 200 MG capsule Take 200 mg by mouth daily.    . clopidogrel (PLAVIX) 75 MG tablet Take 1 tablet (75 mg total) by mouth daily. 30 tablet 11  . furosemide (LASIX) 20 MG tablet Take 20 mg by mouth daily.    Marland Kitchen glipiZIDE (GLUCOTROL XL) 5 MG 24 hr tablet Take 5 mg by mouth 2 (two) times daily.    . Liraglutide (VICTOZA) 18 MG/3ML SOPN Inject into the skin.    Marland Kitchen lisinopril-hydrochlorothiazide (PRINZIDE,ZESTORETIC) 20-25 MG per tablet Take 1 tablet by mouth daily.    . metFORMIN (GLUCOPHAGE) 1000 MG tablet Take 1,000 mg by mouth 2 (two) times daily with a meal.    . mupirocin ointment (BACTROBAN) 2 % Place 1 application on wound daily 22 g 0  . pregabalin (LYRICA) 25 MG capsule Take 50 mg by mouth 3 (three) times daily.     . rosuvastatin (CRESTOR) 5 MG tablet Take 5 mg by mouth daily.    Marland Kitchen sulfamethoxazole-trimethoprim (BACTRIM DS) 800-160 MG tablet Take 1 tablet by mouth 2 (two) times daily. 20 tablet 0  . traMADol (ULTRAM) 50 MG tablet Take 1 tablet (50 mg total) by mouth every 6 (six) hours as needed. 20 tablet 0   No current facility-administered medications on file prior to visit.    There are no Patient Instructions on file for this visit. No follow-ups on file.   Georgiana Spinner, NP

## 2019-11-07 DIAGNOSIS — N289 Disorder of kidney and ureter, unspecified: Secondary | ICD-10-CM | POA: Diagnosis not present

## 2019-11-07 DIAGNOSIS — D509 Iron deficiency anemia, unspecified: Secondary | ICD-10-CM | POA: Diagnosis not present

## 2019-11-07 DIAGNOSIS — E559 Vitamin D deficiency, unspecified: Secondary | ICD-10-CM | POA: Diagnosis not present

## 2019-11-07 DIAGNOSIS — M129 Arthropathy, unspecified: Secondary | ICD-10-CM | POA: Diagnosis not present

## 2019-11-07 DIAGNOSIS — E785 Hyperlipidemia, unspecified: Secondary | ICD-10-CM | POA: Diagnosis not present

## 2019-11-07 DIAGNOSIS — G9009 Other idiopathic peripheral autonomic neuropathy: Secondary | ICD-10-CM | POA: Diagnosis not present

## 2019-11-07 DIAGNOSIS — R5383 Other fatigue: Secondary | ICD-10-CM | POA: Diagnosis not present

## 2019-11-07 DIAGNOSIS — I10 Essential (primary) hypertension: Secondary | ICD-10-CM | POA: Diagnosis not present

## 2019-11-07 DIAGNOSIS — Z0000 Encounter for general adult medical examination without abnormal findings: Secondary | ICD-10-CM | POA: Diagnosis not present

## 2019-11-10 DIAGNOSIS — N289 Disorder of kidney and ureter, unspecified: Secondary | ICD-10-CM | POA: Diagnosis not present

## 2019-11-10 DIAGNOSIS — E559 Vitamin D deficiency, unspecified: Secondary | ICD-10-CM | POA: Diagnosis not present

## 2019-11-10 DIAGNOSIS — R5383 Other fatigue: Secondary | ICD-10-CM | POA: Diagnosis not present

## 2019-11-10 DIAGNOSIS — M129 Arthropathy, unspecified: Secondary | ICD-10-CM | POA: Diagnosis not present

## 2019-11-10 DIAGNOSIS — D509 Iron deficiency anemia, unspecified: Secondary | ICD-10-CM | POA: Diagnosis not present

## 2019-11-10 DIAGNOSIS — I10 Essential (primary) hypertension: Secondary | ICD-10-CM | POA: Diagnosis not present

## 2019-11-10 DIAGNOSIS — E785 Hyperlipidemia, unspecified: Secondary | ICD-10-CM | POA: Diagnosis not present

## 2019-11-10 DIAGNOSIS — E669 Obesity, unspecified: Secondary | ICD-10-CM | POA: Diagnosis not present

## 2019-11-28 ENCOUNTER — Ambulatory Visit (INDEPENDENT_AMBULATORY_CARE_PROVIDER_SITE_OTHER): Payer: Medicare HMO | Admitting: Vascular Surgery

## 2019-12-08 DIAGNOSIS — M129 Arthropathy, unspecified: Secondary | ICD-10-CM | POA: Diagnosis not present

## 2019-12-08 DIAGNOSIS — I739 Peripheral vascular disease, unspecified: Secondary | ICD-10-CM | POA: Diagnosis not present

## 2019-12-08 DIAGNOSIS — G9009 Other idiopathic peripheral autonomic neuropathy: Secondary | ICD-10-CM | POA: Diagnosis not present

## 2019-12-12 ENCOUNTER — Ambulatory Visit (INDEPENDENT_AMBULATORY_CARE_PROVIDER_SITE_OTHER): Payer: Medicare HMO | Admitting: Nurse Practitioner

## 2019-12-15 ENCOUNTER — Encounter (INDEPENDENT_AMBULATORY_CARE_PROVIDER_SITE_OTHER): Payer: Self-pay | Admitting: Nurse Practitioner

## 2019-12-15 ENCOUNTER — Other Ambulatory Visit: Payer: Self-pay

## 2019-12-15 ENCOUNTER — Ambulatory Visit (INDEPENDENT_AMBULATORY_CARE_PROVIDER_SITE_OTHER): Payer: Medicare HMO | Admitting: Nurse Practitioner

## 2019-12-15 VITALS — BP 183/72 | HR 75 | Resp 16

## 2019-12-15 DIAGNOSIS — I10 Essential (primary) hypertension: Secondary | ICD-10-CM

## 2019-12-15 DIAGNOSIS — I890 Lymphedema, not elsewhere classified: Secondary | ICD-10-CM

## 2019-12-15 DIAGNOSIS — S90424D Blister (nonthermal), right lesser toe(s), subsequent encounter: Secondary | ICD-10-CM

## 2019-12-15 MED ORDER — MUPIROCIN 2 % EX OINT: TOPICAL_OINTMENT | CUTANEOUS | 0 refills | Status: AC

## 2019-12-15 NOTE — Progress Notes (Signed)
Subjective:    Patient ID: [name], female    DOB: [birth year], 74 y.o.   MRN: [id number] Chief Complaint  Patient presents with  . Follow-up    6wk follow up    Today patient's lower extremity swelling is well controlled.  The patient has been diligent with wearing medical grade 1 compression stockings and elevation.  She has not been utilizing her lymphedema pump as much however she is trying to find her home so that she can utilize it.  She denies any ulcerations or blisters.  She denies any fever, chills, nausea, vomiting or diarrhea.  Her only complaint is her toe which does continue to have a blister from time to time.  She does have an upcoming appointment with podiatry.   Review of Systems  Cardiovascular: Positive for leg swelling.  Skin: Positive for wound.  Neurological: Positive for weakness.  All other systems reviewed and are negative.      Objective:   Physical Exam Vitals reviewed.  Constitutional:      Appearance: She is obese.  HENT:     Head: Normocephalic.  Cardiovascular:     Rate and Rhythm: Normal rate.     Pulses: Normal pulses.  Pulmonary:     Effort: Pulmonary effort is normal.  Musculoskeletal:     Right lower leg: Edema present.     Left lower leg: Edema present.  Neurological:     Mental Status: She is alert and oriented to person, place, and time.     Motor: Weakness present.     Gait: Gait abnormal.  Psychiatric:        Mood and Affect: Mood normal.        Behavior: Behavior normal.        Thought Content: Thought content normal.        Judgment: Judgment normal.     BP (!) 183/72 (BP Location: Left Arm)   Pulse 75   Resp 16   Past Medical History:  Diagnosis Date  . Anemia    as young child  . Arthritis    back, leg and foot  . Blood transfusion    1964  . Carpal tunnel syndrome   . Diabetes mellitus    oral medications  . Hypertension     Social History   Socioeconomic History  . Marital status: Married     Spouse name: Not on file  . Number of children: Not on file  . Years of education: Not on file  . Highest education level: Not on file  Occupational History  . Not on file  Tobacco Use  . Smoking status: Never Smoker  . Smokeless tobacco: Never Used  Substance and Sexual Activity  . Alcohol use: No  . Drug use: No  . Sexual activity: Not on file  Other Topics Concern  . Not on file  Social History Narrative  . Not on file   Social Determinants of Health   Financial Resource Strain:   . Difficulty of Paying Living Expenses: Not on file  Food Insecurity:   . Worried About Programme researcher, broadcasting/film/video in the Last Year: Not on file  . Ran Out of Food in the Last Year: Not on file  Transportation Needs:   . Lack of Transportation (Medical): Not on file  . Lack of Transportation (Non-Medical): Not on file  Physical Activity:   . Days of Exercise per Week: Not on file  . Minutes of Exercise per Session: Not  on file  Stress:   . Feeling of Stress : Not on file  Social Connections:   . Frequency of Communication with Friends and Family: Not on file  . Frequency of Social Gatherings with Friends and Family: Not on file  . Attends Religious Services: Not on file  . Active Member of Clubs or Organizations: Not on file  . Attends Banker Meetings: Not on file  . Marital Status: Not on file  Intimate Partner Violence:   . Fear of Current or Ex-Partner: Not on file  . Emotionally Abused: Not on file  . Physically Abused: Not on file  . Sexually Abused: Not on file    Past Surgical History:  Procedure Laterality Date  . ABDOMINAL HYSTERECTOMY    . ANTERIOR CERVICAL DECOMP/DISCECTOMY FUSION  04/27/2011   Procedure: ANTERIOR CERVICAL DECOMPRESSION/DISCECTOMY FUSION 2 LEVELS;  Surgeon: Mariam Dollar, MD;  Location: [facility];  Service: Neurosurgery;  Laterality: N/A;  Anterior Cervical Decompression/ Discectomy Fusion Cervical Three-Four, Cervical Four-Five  . APPENDECTOMY    .  KNEE ARTHROPLASTY     right  . PERIPHERAL VASCULAR CATHETERIZATION Left 10/29/2014   Procedure: Lower Extremity Angiography;  Surgeon: Annice Needy, MD;  Location: [facility];  Service: Cardiovascular;  Laterality: Left;  . PERIPHERAL VASCULAR CATHETERIZATION Left 10/29/2014   Procedure: Lower Extremity Intervention;  Surgeon: Annice Needy, MD;  Location: [facility];  Service: Cardiovascular;  Laterality: Left;  . PERIPHERAL VASCULAR CATHETERIZATION Left 03/11/2015   Procedure: Lower Extremity Angiography;  Surgeon: Annice Needy, MD;  Location: [facility];  Service: Cardiovascular;  Laterality: Left;  . PERIPHERAL VASCULAR CATHETERIZATION Left 03/11/2015   Procedure: Lower Extremity Intervention;  Surgeon: Annice Needy, MD;  Location: [facility];  Service: Cardiovascular;  Laterality: Left;    Family History  Problem Relation Age of Onset  . Cancer Mother   . Hypertension Mother   . Diabetes Paternal Grandmother     Allergies  Allergen Reactions  . Penicillins Shortness Of Breath and Swelling  . Codeine Other (See Comments)    Ringing in ear    CBC Latest Ref Rng & Units 06/09/2016 08/10/2013 04/22/2011  WBC 3.6 - 11.0 K/uL 2.3(L) 4.0 8.2  Hemoglobin 12.0 - 16.0 g/dL 11.8(L) 11.5(L) 12.4  Hematocrit 35 - 47 % 35.9 36.1 37.0  Platelets 150 - 440 K/uL 151 196 228      CMP     Component Value Date/Time   NA 139 06/09/2016 1832   NA 139 08/10/2013 1315   K 3.4 (L) 06/09/2016 1832   K 3.7 08/10/2013 1315   CL 100 (L) 06/09/2016 1832   CL 105 08/10/2013 1315   CO2 29 06/09/2016 1832   CO2 28 08/10/2013 1315   GLUCOSE 295 (H) 06/09/2016 1832   GLUCOSE 310 (H) 08/10/2013 1315   BUN 27 (H) 06/09/2016 1832   BUN 15 08/10/2013 1315   CREATININE 1.65 (H) 06/09/2016 1832   CREATININE 1.15 08/10/2013 1315   CALCIUM 8.3 (L) 06/09/2016 1832   CALCIUM 8.8 08/10/2013 1315   PROT 6.8 06/09/2016 1832   ALBUMIN 3.4 (L) 06/09/2016 1832   AST 41 06/09/2016  1832   ALT 28 06/09/2016 1832   ALKPHOS 73 06/09/2016 1832   BILITOT 0.4 06/09/2016 1832   GFRNONAA 30 (L) 06/09/2016 1832   GFRNONAA 49 (L) 08/10/2013 1315   GFRAA 35 (L) 06/09/2016 1832   GFRAA 56 (L) 08/10/2013 1315  Assessment & Plan:   1. Lymphedema Patient is advised to continue with her medical grade 1 compression stockings daily.  She is also advised to elevate her lower extremities as much as possible.  Patient is also advised to try to utilize her lymphedema pump when she finds it.  At least an hour during the day.  We will have the patient return in 6 months with noninvasive studies if there are no wounds or ulcerations prior to.  The patient is advised to contact her office however if she does begin to experience wounds or ulcerations.  2. Benign essential hypertension Continue antihypertensive medications as already ordered, these medications have been reviewed and there are no changes at this time.   3. Toe blister without infection, right, subsequent encounter Patient has a small reoccurring blister on her right great toe.  It usually associated with losing her toenail.  She does have an upcoming appointment with podiatry.  We will have the patient continue to keep the wound clean and apply ointment daily until she is able to follow podiatry recommendations. - mupirocin ointment (BACTROBAN) 2 %; Place 1 application on wound daily  Dispense: 22 g; Refill: 0   Current Outpatient Medications on File Prior to Visit  Medication Sig Dispense Refill  . aspirin EC 81 MG tablet Take 81 mg by mouth daily.    . celecoxib (CELEBREX) 200 MG capsule Take 200 mg by mouth daily.    . clopidogrel (PLAVIX) 75 MG tablet Take 1 tablet (75 mg total) by mouth daily. 30 tablet 11  . furosemide (LASIX) 20 MG tablet Take 20 mg by mouth daily.    Marland Kitchen glipiZIDE (GLUCOTROL XL) 5 MG 24 hr tablet Take 5 mg by mouth 2 (two) times daily.    . Liraglutide (VICTOZA) 18 MG/3ML SOPN Inject into the  skin.    Marland Kitchen lisinopril-hydrochlorothiazide (PRINZIDE,ZESTORETIC) 20-25 MG per tablet Take 1 tablet by mouth daily.    . metFORMIN (GLUCOPHAGE) 1000 MG tablet Take 1,000 mg by mouth 2 (two) times daily with a meal.    . pregabalin (LYRICA) 25 MG capsule Take 50 mg by mouth 3 (three) times daily.     . rosuvastatin (CRESTOR) 5 MG tablet Take 5 mg by mouth daily.    Marland Kitchen sulfamethoxazole-trimethoprim (BACTRIM DS) 800-160 MG tablet Take 1 tablet by mouth 2 (two) times daily. 20 tablet 0  . traMADol (ULTRAM) 50 MG tablet Take 1 tablet (50 mg total) by mouth every 6 (six) hours as needed. 20 tablet 0   No current facility-administered medications on file prior to visit.    There are no Patient Instructions on file for this visit. No follow-ups on file.   Georgiana Spinner, NP

## 2019-12-22 DIAGNOSIS — B351 Tinea unguium: Secondary | ICD-10-CM | POA: Diagnosis not present

## 2019-12-22 DIAGNOSIS — L851 Acquired keratosis [keratoderma] palmaris et plantaris: Secondary | ICD-10-CM | POA: Diagnosis not present

## 2019-12-22 DIAGNOSIS — I739 Peripheral vascular disease, unspecified: Secondary | ICD-10-CM | POA: Diagnosis not present

## 2019-12-22 DIAGNOSIS — M2041 Other hammer toe(s) (acquired), right foot: Secondary | ICD-10-CM | POA: Diagnosis not present

## 2019-12-22 DIAGNOSIS — L97511 Non-pressure chronic ulcer of other part of right foot limited to breakdown of skin: Secondary | ICD-10-CM | POA: Diagnosis not present

## 2019-12-22 DIAGNOSIS — M2042 Other hammer toe(s) (acquired), left foot: Secondary | ICD-10-CM | POA: Diagnosis not present

## 2019-12-22 DIAGNOSIS — E1140 Type 2 diabetes mellitus with diabetic neuropathy, unspecified: Secondary | ICD-10-CM | POA: Diagnosis not present

## 2019-12-26 DIAGNOSIS — R69 Illness, unspecified: Secondary | ICD-10-CM | POA: Diagnosis not present

## 2020-01-15 ENCOUNTER — Other Ambulatory Visit: Payer: Self-pay

## 2020-01-15 ENCOUNTER — Emergency Department: Payer: Medicare HMO

## 2020-01-15 ENCOUNTER — Emergency Department
Admission: EM | Admit: 2020-01-15 | Discharge: 2020-01-15 | Disposition: A | Discharge status: Home / Self Care | Payer: Medicare HMO | Attending: Emergency Medicine | Admitting: Emergency Medicine

## 2020-01-15 ENCOUNTER — Encounter: Payer: Self-pay | Admitting: Emergency Medicine

## 2020-01-15 DIAGNOSIS — M19041 Primary osteoarthritis, right hand: Secondary | ICD-10-CM | POA: Diagnosis not present

## 2020-01-15 DIAGNOSIS — M19031 Primary osteoarthritis, right wrist: Secondary | ICD-10-CM | POA: Diagnosis not present

## 2020-01-15 DIAGNOSIS — M19011 Primary osteoarthritis, right shoulder: Secondary | ICD-10-CM | POA: Diagnosis not present

## 2020-01-15 DIAGNOSIS — M19049 Primary osteoarthritis, unspecified hand: Secondary | ICD-10-CM | POA: Insufficient documentation

## 2020-01-15 DIAGNOSIS — I10 Essential (primary) hypertension: Secondary | ICD-10-CM | POA: Diagnosis not present

## 2020-01-15 DIAGNOSIS — Z7982 Long term (current) use of aspirin: Secondary | ICD-10-CM | POA: Diagnosis not present

## 2020-01-15 DIAGNOSIS — Z7902 Long term (current) use of antithrombotics/antiplatelets: Secondary | ICD-10-CM | POA: Diagnosis not present

## 2020-01-15 DIAGNOSIS — Z79899 Other long term (current) drug therapy: Secondary | ICD-10-CM | POA: Diagnosis not present

## 2020-01-15 DIAGNOSIS — Z96651 Presence of right artificial knee joint: Secondary | ICD-10-CM | POA: Diagnosis not present

## 2020-01-15 DIAGNOSIS — M79641 Pain in right hand: Secondary | ICD-10-CM | POA: Diagnosis not present

## 2020-01-15 DIAGNOSIS — Z7984 Long term (current) use of oral hypoglycemic drugs: Secondary | ICD-10-CM | POA: Diagnosis not present

## 2020-01-15 DIAGNOSIS — M25511 Pain in right shoulder: Secondary | ICD-10-CM | POA: Diagnosis not present

## 2020-01-15 DIAGNOSIS — M25531 Pain in right wrist: Secondary | ICD-10-CM | POA: Diagnosis not present

## 2020-01-15 DIAGNOSIS — E119 Type 2 diabetes mellitus without complications: Secondary | ICD-10-CM | POA: Diagnosis not present

## 2020-01-15 MED ORDER — ACETAMINOPHEN 500 MG PO TABS
1000.0000 mg | ORAL_TABLET | Freq: Once | ORAL | Status: AC
  Administered 2020-01-15: 1000 mg via ORAL
  Filled 2020-01-15: qty 2

## 2020-01-15 MED ORDER — METHOCARBAMOL 500 MG PO TABS
500.0000 mg | ORAL_TABLET | Freq: Once | ORAL | Status: AC
  Administered 2020-01-15: 500 mg via ORAL
  Filled 2020-01-15: qty 1

## 2020-01-15 MED ORDER — METHOCARBAMOL 500 MG PO TABS: 500.0000 mg | ORAL_TABLET | Freq: Four times a day (QID) | ORAL | 0 refills | Status: AC

## 2020-01-15 NOTE — ED Triage Notes (Signed)
Patient to ER for c/o right thumb pain x3 weeks. Patient states she noticed "knot" come up on same finger.

## 2020-01-15 NOTE — ED Notes (Signed)
See triage note. Pt presents to the ED for R thumb pain for the last 3 weeks. Pt is A&Ox4 and NAD.

## 2020-01-15 NOTE — ED Notes (Signed)
Patient has right thumb, and shoulder pain.

## 2020-01-15 NOTE — ED Provider Notes (Signed)
[facility] Emergency Department Provider Note  ____________________________________________   First MD Initiated Contact with Patient 01/15/20 1708     (approximate)  I have reviewed the triage vital signs and the nursing notes.   HISTORY  Chief Complaint Hand Pain  HPI [name] is a 74 y.o. female who reports to the emergency department for evaluation of right hand pain as well as right shoulder pain.  The patient states that she did not have any recent trauma or fall.  In regards to her right shoulder, she states that it has been painful for the last 2 months.  She has a history of rotator cuff tear that was treated conservatively without any surgical intervention many years ago.  She will intermittently have trouble with lifting the arm but states that this is usually improved on its own with rest.  She says that despite her normal treatments, she has been I'm able to get it to improve over the last 2 months.  She is also complaining of right hand pain that is most significant at the first MCP.  She states that this first became painful 3 weeks ago but she is unsure of any specific injury.  She states that it became swollen and she thought that she saw a "knot" on the dorsal aspect of the right first MCP.  She states that her pain is an 8/10.          Past Medical History:  Diagnosis Date  . Anemia    as young child  . Arthritis    back, leg and foot  . Blood transfusion    1964  . Carpal tunnel syndrome   . Diabetes mellitus    oral medications  . Hypertension     Patient Active Problem List   Diagnosis Date Noted  . Diabetes (HCC) 07/28/2019  . GERD (gastroesophageal reflux disease) 07/28/2019  . Hyperlipidemia 07/28/2019  . Peripheral neuropathy 07/28/2019  . Hypertension 07/28/2019  . Atherosclerosis of native arteries of the extremities with ulceration (HCC) 06/02/2019  . History of total right knee replacement 11/11/2018  . Morbid  obesity with body mass index (BMI) of 40.0 or higher (HCC) 11/11/2018  . Peripheral vascular disease (HCC) 11/11/2018  . Varicose veins of bilateral lower extremities with other complications 01/28/2018  . Lymphedema 03/24/2017  . Lower limb ulcer, calf, right, limited to breakdown of skin (HCC) 02/17/2017  . Essential hypertension, benign 08/25/2016  . Benign essential hypertension 08/25/2016  . Bilateral lower extremity edema 07/22/2016    Past Surgical History:  Procedure Laterality Date  . ABDOMINAL HYSTERECTOMY    . ANTERIOR CERVICAL DECOMP/DISCECTOMY FUSION  04/27/2011   Procedure: ANTERIOR CERVICAL DECOMPRESSION/DISCECTOMY FUSION 2 LEVELS;  Surgeon: Mariam DollarGary P Cram, MD;  Location: [facility];  Service: Neurosurgery;  Laterality: N/A;  Anterior Cervical Decompression/ Discectomy Fusion Cervical Three-Four, Cervical Four-Five  . APPENDECTOMY    . KNEE ARTHROPLASTY     right  . PERIPHERAL VASCULAR CATHETERIZATION Left 10/29/2014   Procedure: Lower Extremity Angiography;  Surgeon: Annice NeedyJason S Dew, MD;  Location: [facility];  Service: Cardiovascular;  Laterality: Left;  . PERIPHERAL VASCULAR CATHETERIZATION Left 10/29/2014   Procedure: Lower Extremity Intervention;  Surgeon: Annice NeedyJason S Dew, MD;  Location: [facility];  Service: Cardiovascular;  Laterality: Left;  . PERIPHERAL VASCULAR CATHETERIZATION Left 03/11/2015   Procedure: Lower Extremity Angiography;  Surgeon: Annice NeedyJason S Dew, MD;  Location: [facility];  Service: Cardiovascular;  Laterality: Left;  .  PERIPHERAL VASCULAR CATHETERIZATION Left 03/11/2015   Procedure: Lower Extremity Intervention;  Surgeon: Annice Needy, MD;  Location: [facility];  Service: Cardiovascular;  Laterality: Left;    Prior to Admission medications   Medication Sig Start Date End Date Taking? Authorizing Provider  aspirin EC 81 MG tablet Take 81 mg by mouth daily.    [provider]  celecoxib (CELEBREX) 200 MG capsule Take  200 mg by mouth daily.    [provider]  clopidogrel (PLAVIX) 75 MG tablet Take 1 tablet (75 mg total) by mouth daily. 03/11/15   Annice Needy, MD  furosemide (LASIX) 20 MG tablet Take 20 mg by mouth daily.    [provider]  glipiZIDE (GLUCOTROL XL) 5 MG 24 hr tablet Take 5 mg by mouth 2 (two) times daily.    [provider]  Liraglutide (VICTOZA) 18 MG/3ML SOPN Inject into the skin.    [provider]  lisinopril-hydrochlorothiazide (PRINZIDE,ZESTORETIC) 20-25 MG per tablet Take 1 tablet by mouth daily.    [provider]  metFORMIN (GLUCOPHAGE) 1000 MG tablet Take 1,000 mg by mouth 2 (two) times daily with a meal.    [provider]  methocarbamol (ROBAXIN) 500 MG tablet Take 1 tablet (500 mg total) by mouth 4 (four) times daily. 01/15/20   Lucy Chris, PA  mupirocin ointment (BACTROBAN) 2 % Place 1 application on wound daily 12/15/19   Georgiana Spinner, NP  pregabalin (LYRICA) 25 MG capsule Take 50 mg by mouth 3 (three) times daily.     [provider]  rosuvastatin (CRESTOR) 5 MG tablet Take 5 mg by mouth daily.    [provider]  sulfamethoxazole-trimethoprim (BACTRIM DS) 800-160 MG tablet Take 1 tablet by mouth 2 (two) times daily. 10/10/19   Georgiana Spinner, NP  traMADol (ULTRAM) 50 MG tablet Take 1 tablet (50 mg total) by mouth every 6 (six) hours as needed. 10/10/19   Georgiana Spinner, NP    Allergies Penicillins, Cheese, Codeine, and Strawberry (diagnostic)  Family History  Problem Relation Age of Onset  . Cancer Mother   . Hypertension Mother   . Diabetes Paternal Grandmother     Social History Social History   Tobacco Use  . Smoking status: Never Smoker  . Smokeless tobacco: Never Used  Substance Use Topics  . Alcohol use: No  . Drug use: No    Review of Systems Constitutional: No fever/chills Eyes: No visual changes. ENT: No sore throat. Cardiovascular: Denies chest pain. Respiratory:  Denies shortness of breath. Gastrointestinal: No abdominal pain.  No nausea, no vomiting.  No diarrhea.  No constipation. Genitourinary: Negative for dysuria. Musculoskeletal: + Right thumb pain, + right shoulder pain, negative for back pain. Skin: Negative for rash. Neurological: Negative for headaches, focal weakness or numbness.  ____________________________________________   PHYSICAL EXAM:  VITAL SIGNS: ED Triage Vitals  Enc Vitals Group     BP 01/15/20 1626 (!) 146/50     Pulse Rate 01/15/20 1626 73     Resp 01/15/20 1626 18     Temp 01/15/20 1626 98.4 F (36.9 C)     Temp Source 01/15/20 1626 Oral     SpO2 01/15/20 1626 100 %     Weight 01/15/20 1627 289 lb (131.1 kg)     Height 01/15/20 1627 5\' 8"  (1.727 m)     Head Circumference --      Peak Flow --      Pain Score 01/15/20  1626 8     Pain Loc --      Pain Edu? --      Excl. in GC? --    Constitutional: Alert and oriented. Well appearing and in no acute distress. Eyes: Conjunctivae are normal.  EOMI. Head: Atraumatic. Nose: No congestion/rhinnorhea. Mouth/Throat: Mucous membranes are moist.  Oropharynx non-erythematous. Neck: No stridor.  No tenderness to palpation of the midline of the cervical spine or paraspinal musculature.  There is mild tenderness to palpation of the right upper trapezius. Cardiovascular: Normal rate, regular rhythm.  Good peripheral circulation. Respiratory: Normal respiratory effort.  No retractions. Lungs CTAB. Gastrointestinal: Soft and nontender. No distention. No abdominal bruits. No CVA tenderness. Musculoskeletal: There is tenderness to palpation of the right glenohumeral joint, particularly over the lateral most aspect.  The patient has profound weakness in active flexion, abduction, internal and external rotation compared to the contralateral side.  Patient has equal strength bilaterally of the right biceps.  There is soft tissue swelling and mild erythema over the first MCP on the  right.  This is associated with exquisite tenderness to touch.  The patient is able to flex the MCP but with increased pain.  Radial pulse 2+ grip strength equal bilaterally. Neurologic:  Normal speech and language. No gross focal neurologic deficits are appreciated. No gait instability. Skin:  Skin is warm, dry and intact. No rash noted. Psychiatric: Mood and affect are normal. Speech and behavior are normal.   ____________________________________________  RADIOLOGY I, Lucy Chris, personally viewed and evaluated these images (plain radiographs) as part of my medical decision making, as well as reviewing the written report by the radiologist.  ED provider interpretation: X-ray of the right shoulder reveals rotator cuff arthropathy with arthritic changes, x-ray of the right hand reveals significant osteoarthritis throughout multiple joints including the right MCP.  There is questionable lucency on the proximal phalanx at the MCP joint on the radial most aspect.  Official radiology report(s): DG Shoulder Right  Result Date: 01/15/2020 CLINICAL DATA:  Right shoulder pain for 2 months EXAM: RIGHT SHOULDER - 2+ VIEW COMPARISON:  None. FINDINGS: Internal rotation, external rotation, and transscapular views of the right shoulder are obtained. No fracture, subluxation, or dislocation. Significant hypertrophic changes are seen at the acromioclavicular joint, with moderate spurring of the undersurface of the acromion process. The there is significant glenohumeral osteoarthritis with joint space narrowing and osteophyte formation. Narrowing of the acromial humeral interval suggests chronic longstanding rotator cuff tear. Calcifications within the superior margin of the rotator cuff consistent with calcific tendinopathy. IMPRESSION: 1. Extensive degenerative changes as above.  No acute fracture. Electronically Signed   By: Sharlet Salina M.D.   On: 01/15/2020 18:22   DG Hand Complete Right  Result  Date: 01/15/2020 CLINICAL DATA:  Pain EXAM: RIGHT HAND - COMPLETE 3+ VIEW COMPARISON:  None. FINDINGS: Frontal, oblique, and lateral views were obtained. No evident fracture or dislocation. There is osteoarthritic change in the first IP joint as well as in all PIP and DIP joints. There is also osteoarthritic change in the first MCP joint. In the wrist region, there is osteoarthritic change in the first carpal-metacarpal and scaphotrapezial joints. No erosions. IMPRESSION: Multilevel osteoarthritic change.  No fracture or dislocation. Electronically Signed   By: Bretta Bang III M.D.   On: 01/15/2020 18:22     ____________________________________________   INITIAL IMPRESSION / ASSESSMENT AND PLAN / ED COURSE  As part of my medical decision making, I reviewed the following data within  the electronic MEDICAL RECORD NUMBER Nursing notes reviewed and incorporated and Radiograph reviewed        Patient is a 74 year old female who presents to the emergency department for evaluation of right shoulder and right hand pain.  See HPI for further details.  In regards to the right shoulder, the patient does have a significant amount of weakness in flexion abduction, internal rotation and external rotation.  X-ray findings demonstrate rotator cuff arthropathy consistent with degenerative changes of chronic rotator cuff tear.  The patient has been using Tylenol at home, though has not been using a full 1000 mg four times daily.  Will defer use of NSAIDs due to the patient's age and risk for bleeding.  The patient is a diabetic with an A1c that is not recorded recently in the chart and therefore will defer use of steroid.  Offered the patient a short course of muscle relaxants to use with 1000 mg of Tylenol 4 times daily.  Discussed in detail the risk and benefits of muscle relaxant use in her age population and to use cautiously.  The son was in the room with the patient and also acknowledged the risks and to be  aware.  In regards to her right thumb.  The patient has pain to palpation of the MCP.  On x-rays this is the site of a significant amount of arthritic degeneration though there does appear to possibly be some lucency at the proximal aspect of the proximal phalanx of the MCP on the radial side.  She has had pain for 3 weeks but is unsure of any specific injury.  This could represent fracture versus osteoporotic changes versus degenerative changes.  This was discussed with the patient as well.  Will recommend a prefabricated thumb spica wrist splint for protection and rest of the first MCP.  Will recommend that the patient seek orthopedic evaluation for both of these above conditions.  The patient is amenable with this plan and will return to the emergency department for any worsening.      ____________________________________________   FINAL CLINICAL IMPRESSION(S) / ED DIAGNOSES  Final diagnoses:  Hand arthritis  Glenohumeral arthritis, right     ED Discharge Orders         Ordered    methocarbamol (ROBAXIN) 500 MG tablet  4 times daily        01/15/20 1924          *Please note:  NYNA CHILTON was evaluated in Emergency Department on 01/15/2020 for the symptoms described in the history of present illness. She was evaluated in the context of the global COVID-19 pandemic, which necessitated consideration that the patient might be at risk for infection with the SARS-CoV-2 virus that causes COVID-19. Institutional protocols and algorithms that pertain to the evaluation of patients at risk for COVID-19 are in a state of rapid change based on information released by regulatory bodies including the CDC and federal and state organizations. These policies and algorithms were followed during the patient's care in the ED.  Some ED evaluations and interventions may be delayed as a result of limited staffing during and the pandemic.*   Note:  This document was prepared using Dragon voice recognition  software and may include unintentional dictation errors.    Lucy Chris, PA 01/15/20 9924    Phineas Semen, MD 01/15/20 2325

## 2020-01-26 DIAGNOSIS — E1369 Other specified diabetes mellitus with other specified complication: Secondary | ICD-10-CM | POA: Diagnosis not present

## 2020-01-26 DIAGNOSIS — M19011 Primary osteoarthritis, right shoulder: Secondary | ICD-10-CM | POA: Diagnosis not present

## 2020-01-26 DIAGNOSIS — M158 Other polyosteoarthritis: Secondary | ICD-10-CM | POA: Diagnosis not present

## 2020-02-15 DIAGNOSIS — R69 Illness, unspecified: Secondary | ICD-10-CM | POA: Diagnosis not present

## 2020-02-26 ENCOUNTER — Other Ambulatory Visit: Payer: Self-pay

## 2020-02-26 ENCOUNTER — Encounter: Payer: Self-pay | Admitting: Emergency Medicine

## 2020-02-26 ENCOUNTER — Emergency Department
Admission: EM | Admit: 2020-02-26 | Discharge: 2020-02-26 | Disposition: A | Discharge status: Home / Self Care | Payer: Medicare HMO | Attending: Emergency Medicine | Admitting: Emergency Medicine

## 2020-02-26 ENCOUNTER — Emergency Department: Payer: Medicare HMO

## 2020-02-26 DIAGNOSIS — S0083XA Contusion of other part of head, initial encounter: Secondary | ICD-10-CM | POA: Diagnosis not present

## 2020-02-26 DIAGNOSIS — E785 Hyperlipidemia, unspecified: Secondary | ICD-10-CM | POA: Insufficient documentation

## 2020-02-26 DIAGNOSIS — W19XXXA Unspecified fall, initial encounter: Secondary | ICD-10-CM

## 2020-02-26 DIAGNOSIS — Z7984 Long term (current) use of oral hypoglycemic drugs: Secondary | ICD-10-CM | POA: Diagnosis not present

## 2020-02-26 DIAGNOSIS — Z7982 Long term (current) use of aspirin: Secondary | ICD-10-CM | POA: Insufficient documentation

## 2020-02-26 DIAGNOSIS — Y9301 Activity, walking, marching and hiking: Secondary | ICD-10-CM | POA: Insufficient documentation

## 2020-02-26 DIAGNOSIS — Y92019 Unspecified place in single-family (private) house as the place of occurrence of the external cause: Secondary | ICD-10-CM | POA: Insufficient documentation

## 2020-02-26 DIAGNOSIS — E1142 Type 2 diabetes mellitus with diabetic polyneuropathy: Secondary | ICD-10-CM | POA: Diagnosis not present

## 2020-02-26 DIAGNOSIS — W01198A Fall on same level from slipping, tripping and stumbling with subsequent striking against other object, initial encounter: Secondary | ICD-10-CM | POA: Insufficient documentation

## 2020-02-26 DIAGNOSIS — S0990XA Unspecified injury of head, initial encounter: Secondary | ICD-10-CM | POA: Diagnosis present

## 2020-02-26 DIAGNOSIS — Z79899 Other long term (current) drug therapy: Secondary | ICD-10-CM | POA: Insufficient documentation

## 2020-02-26 DIAGNOSIS — E1151 Type 2 diabetes mellitus with diabetic peripheral angiopathy without gangrene: Secondary | ICD-10-CM | POA: Diagnosis not present

## 2020-02-26 DIAGNOSIS — Y92009 Unspecified place in unspecified non-institutional (private) residence as the place of occurrence of the external cause: Secondary | ICD-10-CM

## 2020-02-26 DIAGNOSIS — E1169 Type 2 diabetes mellitus with other specified complication: Secondary | ICD-10-CM | POA: Insufficient documentation

## 2020-02-26 DIAGNOSIS — I10 Essential (primary) hypertension: Secondary | ICD-10-CM | POA: Insufficient documentation

## 2020-02-26 DIAGNOSIS — Z96651 Presence of right artificial knee joint: Secondary | ICD-10-CM | POA: Insufficient documentation

## 2020-02-26 MED ORDER — ACETAMINOPHEN 500 MG PO TABS
1000.0000 mg | ORAL_TABLET | Freq: Once | ORAL | Status: AC
  Administered 2020-02-26: 1000 mg via ORAL
  Filled 2020-02-26: qty 2

## 2020-02-26 NOTE — ED Notes (Signed)
E signature not working on [employer] at time of discharge  T5

## 2020-02-26 NOTE — Discharge Instructions (Signed)

## 2020-02-26 NOTE — ED Provider Notes (Signed)
[facility] Emergency Department Provider Note  ____________________________________________  Time seen: Approximately 4:09 AM  I have reviewed the triage vital signs and the nursing notes.   HISTORY  Chief Complaint Fall and Head Injury   HPI [name] is a 75 y.o. female with a history of diabetes, hypertension peripheral vascular disease on Plavix who presents for evaluation of a mechanical fall.  Patient reports that she was walking to the bathroom when she lost her balance and fell.  She reports that the fall was mechanical in nature.  She hit the right side of her head and face onto the floor.  No LOC.  She is on Plavix.  She is complaining of mild throbbing pain on the right side of her head and face.  No visual changes, no neck pain or back pain, no lower extremity pain, no chest pain or shortness of breath.   No history of dementia.  Patient lives at home with her son who was there when she fell.  Past Medical History:  Diagnosis Date  . Anemia    as young child  . Arthritis    back, leg and foot  . Blood transfusion    1964  . Carpal tunnel syndrome   . Diabetes mellitus    oral medications  . Hypertension     Patient Active Problem List   Diagnosis Date Noted  . Diabetes (HCC) 07/28/2019  . GERD (gastroesophageal reflux disease) 07/28/2019  . Hyperlipidemia 07/28/2019  . Peripheral neuropathy 07/28/2019  . Hypertension 07/28/2019  . Atherosclerosis of native arteries of the extremities with ulceration (HCC) 06/02/2019  . History of total right knee replacement 11/11/2018  . Morbid obesity with body mass index (BMI) of 40.0 or higher (HCC) 11/11/2018  . Peripheral vascular disease (HCC) 11/11/2018  . Varicose veins of bilateral lower extremities with other complications 01/28/2018  . Lymphedema 03/24/2017  . Lower limb ulcer, calf, right, limited to breakdown of skin (HCC) 02/17/2017  . Essential hypertension, benign 08/25/2016  .  Benign essential hypertension 08/25/2016  . Bilateral lower extremity edema 07/22/2016    Past Surgical History:  Procedure Laterality Date  . ABDOMINAL HYSTERECTOMY    . ANTERIOR CERVICAL DECOMP/DISCECTOMY FUSION  04/27/2011   Procedure: ANTERIOR CERVICAL DECOMPRESSION/DISCECTOMY FUSION 2 LEVELS;  Surgeon: Mariam Dollar, MD;  Location: [facility];  Service: Neurosurgery;  Laterality: N/A;  Anterior Cervical Decompression/ Discectomy Fusion Cervical Three-Four, Cervical Four-Five  . APPENDECTOMY    . KNEE ARTHROPLASTY     right  . PERIPHERAL VASCULAR CATHETERIZATION Left 10/29/2014   Procedure: Lower Extremity Angiography;  Surgeon: Annice Needy, MD;  Location: [facility];  Service: Cardiovascular;  Laterality: Left;  . PERIPHERAL VASCULAR CATHETERIZATION Left 10/29/2014   Procedure: Lower Extremity Intervention;  Surgeon: Annice Needy, MD;  Location: [facility];  Service: Cardiovascular;  Laterality: Left;  . PERIPHERAL VASCULAR CATHETERIZATION Left 03/11/2015   Procedure: Lower Extremity Angiography;  Surgeon: Annice Needy, MD;  Location: [facility];  Service: Cardiovascular;  Laterality: Left;  . PERIPHERAL VASCULAR CATHETERIZATION Left 03/11/2015   Procedure: Lower Extremity Intervention;  Surgeon: Annice Needy, MD;  Location: [facility];  Service: Cardiovascular;  Laterality: Left;    Prior to Admission medications   Medication Sig Start Date End Date Taking? Authorizing Provider  aspirin EC 81 MG tablet Take 81 mg by mouth daily.    [provider]  celecoxib (CELEBREX) 200 MG capsule  Take 200 mg by mouth daily.    [provider]  clopidogrel (PLAVIX) 75 MG tablet Take 1 tablet (75 mg total) by mouth daily. 03/11/15   Annice Needyew, Jason S, MD  furosemide (LASIX) 20 MG tablet Take 20 mg by mouth daily.    [provider]  glipiZIDE (GLUCOTROL XL) 5 MG 24 hr tablet Take 5 mg by mouth 2 (two) times daily.    [provider]   Liraglutide (VICTOZA) 18 MG/3ML SOPN Inject into the skin.    [provider]  lisinopril-hydrochlorothiazide (PRINZIDE,ZESTORETIC) 20-25 MG per tablet Take 1 tablet by mouth daily.    [provider]  metFORMIN (GLUCOPHAGE) 1000 MG tablet Take 1,000 mg by mouth 2 (two) times daily with a meal.    [provider]  methocarbamol (ROBAXIN) 500 MG tablet Take 1 tablet (500 mg total) by mouth 4 (four) times daily. 01/15/20   Lucy Chrisodgers, Caitlin J, PA  mupirocin ointment (BACTROBAN) 2 % Place 1 application on wound daily 12/15/19   Georgiana SpinnerBrown, Fallon E, NP  pregabalin (LYRICA) 25 MG capsule Take 50 mg by mouth 3 (three) times daily.     [provider]  rosuvastatin (CRESTOR) 5 MG tablet Take 5 mg by mouth daily.    [provider]  sulfamethoxazole-trimethoprim (BACTRIM DS) 800-160 MG tablet Take 1 tablet by mouth 2 (two) times daily. 10/10/19   Georgiana SpinnerBrown, Fallon E, NP  traMADol (ULTRAM) 50 MG tablet Take 1 tablet (50 mg total) by mouth every 6 (six) hours as needed. 10/10/19   Georgiana SpinnerBrown, Fallon E, NP    Allergies Penicillins, Cheese, Codeine, and Strawberry (diagnostic)  Family History  Problem Relation Age of Onset  . Cancer Mother   . Hypertension Mother   . Diabetes Paternal Grandmother     Social History Social History   Tobacco Use  . Smoking status: Never Smoker  . Smokeless tobacco: Never Used  Substance Use Topics  . Alcohol use: No  . Drug use: No    Review of Systems  Constitutional: Negative for fever. Eyes: Negative for visual changes. ENT: Negative for facial injury or neck injury Cardiovascular: Negative for chest injury. Respiratory: Negative for shortness of breath. Negative for chest wall injury. Gastrointestinal: Negative for abdominal pain or injury. Genitourinary: Negative for dysuria. Musculoskeletal: Negative for back injury, negative for arm or leg pain. Skin: Negative for laceration/abrasions. Neurological: + head  injury.   ____________________________________________   PHYSICAL EXAM:  VITAL SIGNS: ED Triage Vitals  Enc Vitals Group     BP 02/26/20 0147 (!) 134/46     Pulse Rate 02/26/20 0147 91     Resp 02/26/20 0147 18     Temp 02/26/20 0147 99.6 F (37.6 C)     Temp Source 02/26/20 0147 Oral     SpO2 02/26/20 0147 100 %     Weight 02/26/20 0148 289 lb (131.1 kg)     Height 02/26/20 0148 5\' 5"  (1.651 m)     Head Circumference --      Peak Flow --      Pain Score 02/26/20 0148 7     Pain Loc --      Pain Edu? --      Excl. in GC? --     Full spinal precautions maintained throughout the trauma exam. Constitutional: Alert and oriented. No acute distress. Does not appear intoxicated. HEENT Head: Normocephalic and atraumatic. Face: No facial bony tenderness. Stable midface. R forehead and periorbital hematoma Ears: No hemotympanum bilaterally.  No Battle sign Eyes: No eye injury. PERRL. No raccoon eyes.  Nose: Nontender. No epistaxis. No rhinorrhea Mouth/Throat: Mucous membranes are moist. No oropharyngeal blood. No dental injury. Airway patent without stridor. Normal voice. Neck: no C-collar. No midline c-spine tenderness.  Cardiovascular: Normal rate, regular rhythm. Normal and symmetric distal pulses are present in all extremities. Pulmonary/Chest: Chest wall is stable and nontender to palpation/compression. Normal respiratory effort. Breath sounds are normal. No crepitus.  Abdominal: Soft, nontender, non distended. Musculoskeletal: Nontender with normal full range of motion in all extremities. No deformities. No thoracic or lumbar midline spinal tenderness. Pelvis is stable. Skin: Skin is warm, dry and intact. No abrasions or contutions. Psychiatric: Speech and behavior are appropriate. Neurological: Normal speech and language. Moves all extremities to command. No gross focal neurologic deficits are appreciated.  Glascow Coma Score: 4 - Opens eyes on own 6 - Follows simple motor  commands 5 - Alert and oriented GCS: 15   ____________________________________________   LABS (all labs ordered are listed, but only abnormal results are displayed)  Labs Reviewed - No data to display ____________________________________________  EKG  ED ECG REPORT I, Nita Sickle, the attending physician, personally viewed and interpreted this ECG.  Normal sinus rhythm, rate of 89, normal intervals, left axis deviation, no ST elevations or depressions.  No significant changes when compared to prior from 2013 ____________________________________________  RADIOLOGY  I have personally reviewed the images performed during this visit and I agree with the Radiologist's read.   Interpretation by Radiologist:  CT Head Wo Contrast  Result Date: 02/26/2020 CLINICAL DATA:  Fall EXAM: CT HEAD WITHOUT CONTRAST TECHNIQUE: Contiguous axial images were obtained from the base of the skull through the vertex without intravenous contrast. COMPARISON:  None. FINDINGS: Brain: No acute intracranial abnormality. Specifically, no hemorrhage, hydrocephalus, mass lesion, acute infarction, or significant intracranial injury. Vascular: No hyperdense vessel or unexpected calcification. Skull: No acute calvarial abnormality. Sinuses/Orbits: No acute findings Other: Soft tissue swelling in the right forehead. IMPRESSION: No acute intracranial abnormality. Electronically Signed   By: Charlett Nose M.D.   On: 02/26/2020 02:23   CT Cervical Spine Wo Contrast  Result Date: 02/26/2020 CLINICAL DATA:  Fall EXAM: CT CERVICAL SPINE WITHOUT CONTRAST TECHNIQUE: Multidetector CT imaging of the cervical spine was performed without intravenous contrast. Multiplanar CT image reconstructions were also generated. COMPARISON:  Normal FINDINGS: Alignment: Normal Skull base and vertebrae: No acute fracture. No primary bone lesion or focal pathologic process. Soft tissues and spinal canal: No prevertebral fluid or swelling. No  visible canal hematoma. Disc levels: Prior anterior fusion from C3-C5. Degenerative disc and facet disease throughout the cervical spine. Upper chest: No acute findings Other: None IMPRESSION: No acute bony abnormality. Electronically Signed   By: Charlett Nose M.D.   On: 02/26/2020 02:24   CT Maxillofacial Wo Contrast  Result Date: 02/26/2020 CLINICAL DATA:  Fall EXAM: CT MAXILLOFACIAL WITHOUT CONTRAST TECHNIQUE: Multidetector CT imaging of the maxillofacial structures was performed. Multiplanar CT image reconstructions were also generated. COMPARISON:  None. FINDINGS: Osseous: No fracture. Lucency around the base of a right upper incisor concerning for periapical abscess. Dental cavity within this tooth. Orbits: Negative. No traumatic or inflammatory finding. Sinuses: Mucosal thickening within the paranasal sinuses. No air-fluid levels. Soft tissues: Soft tissue swelling over the right forehead. Limited intracranial: See head CT report. IMPRESSION: No facial or orbital fracture. Electronically Signed   By: Charlett Nose M.D.   On: 02/26/2020 02:26     ____________________________________________   PROCEDURES  Procedure(s) performed: None Procedures Critical Care performed:  None ____________________________________________   INITIAL IMPRESSION / ASSESSMENT AND PLAN / ED COURSE  75 y.o. female with a history of diabetes, hypertension peripheral vascular disease on Plavix who presents for evaluation of a mechanical fall.  Patient with forehead and periorbital hematoma on the right, no lacerations, no eye involvement, no signs or symptoms of basilar skull fracture.  CT head, face, cervical spine visualized by me with no acute traumatic injuries, confirmed by radiology.  Tylenol for pain.  Discussed wound care and close follow-up with primary care doctor.  Discussed signs and symptoms of delayed head bleed and recommended return to the emergency room if these develop.  Patient be discharged to the  care of her son who lives with her.  Old medical records reviewed.        ____________________________________________  Please note:  Patient was evaluated in Emergency Department today for the symptoms described in the history of present illness. Patient was evaluated in the context of the global COVID-19 pandemic, which necessitated consideration that the patient might be at risk for infection with the SARS-CoV-2 virus that causes COVID-19. Institutional protocols and algorithms that pertain to the evaluation of patients at risk for COVID-19 are in a state of rapid change based on information released by regulatory bodies including the CDC and federal and state organizations. These policies and algorithms were followed during the patient's care in the ED.  Some ED evaluations and interventions may be delayed as a result of limited staffing during the pandemic.   ____________________________________________   FINAL CLINICAL IMPRESSION(S) / ED DIAGNOSES   Final diagnoses:  Fall in home, initial encounter  Contusion of face, initial encounter      NEW MEDICATIONS STARTED DURING THIS VISIT:  ED Discharge Orders    None       Note:  This document was prepared using Dragon voice recognition software and may include unintentional dictation errors.    Nita Sickle, MD 02/26/20 [phone]

## 2020-02-26 NOTE — ED Triage Notes (Signed)
Patient had a mechanical fall. Patient with a hematoma to right eye and abrasion to her forehead. Patient takes plavix. Denies loc.

## 2020-03-21 ENCOUNTER — Encounter (INDEPENDENT_AMBULATORY_CARE_PROVIDER_SITE_OTHER): Payer: Self-pay

## 2020-03-21 ENCOUNTER — Ambulatory Visit (INDEPENDENT_AMBULATORY_CARE_PROVIDER_SITE_OTHER): Payer: Medicare HMO | Admitting: Nurse Practitioner

## 2020-03-21 ENCOUNTER — Other Ambulatory Visit: Payer: Self-pay

## 2020-03-21 DIAGNOSIS — I87313 Chronic venous hypertension (idiopathic) with ulcer of bilateral lower extremity: Secondary | ICD-10-CM

## 2020-03-21 DIAGNOSIS — L03119 Cellulitis of unspecified part of limb: Secondary | ICD-10-CM

## 2020-03-21 MED ORDER — SULFAMETHOXAZOLE-TRIMETHOPRIM 800-160 MG PO TABS: 1.0000 | ORAL_TABLET | Freq: Two times a day (BID) | ORAL | 0 refills | Status: DC

## 2020-03-21 MED ORDER — TRAMADOL HCL 50 MG PO TABS: 50.0000 mg | ORAL_TABLET | Freq: Four times a day (QID) | ORAL | 0 refills | Status: DC | PRN

## 2020-03-21 NOTE — Progress Notes (Signed)
History of Present Illness  There is no documented history at this time  Assessments & Plan   1. Cellulitis of lower extremity, unspecified laterality  - sulfamethoxazole-trimethoprim (BACTRIM DS) 800-160 MG tablet; Take 1 tablet by mouth 2 (two) times daily.  Dispense: 14 tablet; Refill: 0 - traMADol (ULTRAM) 50 MG tablet; Take 1 tablet (50 mg total) by mouth every 6 (six) hours as needed.  Dispense: 20 tablet; Refill: 0     Additional instructions  Subjective:  Patient presents with venous ulcer of the Bilateral lower extremity.    Procedure:  3 layer unna wrap was placed Bilateral lower extremity.   Plan:   Follow up in one week.

## 2020-03-29 ENCOUNTER — Encounter (INDEPENDENT_AMBULATORY_CARE_PROVIDER_SITE_OTHER): Payer: Self-pay

## 2020-03-29 ENCOUNTER — Ambulatory Visit (INDEPENDENT_AMBULATORY_CARE_PROVIDER_SITE_OTHER): Payer: Medicare HMO | Admitting: Nurse Practitioner

## 2020-03-29 ENCOUNTER — Other Ambulatory Visit: Payer: Self-pay

## 2020-03-29 VITALS — BP 179/79 | HR 69 | Resp 16

## 2020-03-29 DIAGNOSIS — L97321 Non-pressure chronic ulcer of left ankle limited to breakdown of skin: Secondary | ICD-10-CM

## 2020-03-29 NOTE — Progress Notes (Signed)
History of Present Illness  There is no documented history at this time  Assessments & Plan   There are no diagnoses linked to this encounter.    Additional instructions  Subjective:  Patient presents with venous ulcer of the Bilateral lower extremity.    Procedure:  3 layer unna wrap was placed Bilateral lower extremity.   Plan:   Follow up in one week.  

## 2020-04-01 ENCOUNTER — Encounter (INDEPENDENT_AMBULATORY_CARE_PROVIDER_SITE_OTHER): Payer: Self-pay | Admitting: Nurse Practitioner

## 2020-04-05 ENCOUNTER — Ambulatory Visit (INDEPENDENT_AMBULATORY_CARE_PROVIDER_SITE_OTHER): Payer: Medicare HMO | Admitting: Nurse Practitioner

## 2020-04-05 ENCOUNTER — Other Ambulatory Visit: Payer: Self-pay

## 2020-04-05 ENCOUNTER — Encounter (INDEPENDENT_AMBULATORY_CARE_PROVIDER_SITE_OTHER): Payer: Self-pay

## 2020-04-05 VITALS — BP 181/91 | HR 66 | Resp 16

## 2020-04-05 DIAGNOSIS — L97321 Non-pressure chronic ulcer of left ankle limited to breakdown of skin: Secondary | ICD-10-CM

## 2020-04-05 NOTE — Progress Notes (Signed)
History of Present Illness  There is no documented history at this time  Assessments & Plan   There are no diagnoses linked to this encounter.    Additional instructions  Subjective:  Patient presents with venous ulcer of the Bilateral lower extremity.    Procedure:  3 layer unna wrap was placed Bilateral lower extremity.   Plan:   Follow up in one week.  

## 2020-04-07 ENCOUNTER — Encounter (INDEPENDENT_AMBULATORY_CARE_PROVIDER_SITE_OTHER): Payer: Self-pay | Admitting: Nurse Practitioner

## 2020-04-12 ENCOUNTER — Encounter (INDEPENDENT_AMBULATORY_CARE_PROVIDER_SITE_OTHER): Payer: Self-pay | Admitting: Nurse Practitioner

## 2020-04-12 ENCOUNTER — Ambulatory Visit (INDEPENDENT_AMBULATORY_CARE_PROVIDER_SITE_OTHER): Payer: Medicare HMO | Admitting: Nurse Practitioner

## 2020-04-12 ENCOUNTER — Other Ambulatory Visit: Payer: Self-pay

## 2020-04-12 VITALS — BP 151/73 | HR 91 | Resp 18 | Ht 66.0 in | Wt 298.0 lb

## 2020-04-12 DIAGNOSIS — I10 Essential (primary) hypertension: Secondary | ICD-10-CM

## 2020-04-12 DIAGNOSIS — L97321 Non-pressure chronic ulcer of left ankle limited to breakdown of skin: Secondary | ICD-10-CM | POA: Diagnosis not present

## 2020-04-13 ENCOUNTER — Encounter (INDEPENDENT_AMBULATORY_CARE_PROVIDER_SITE_OTHER): Payer: Self-pay | Admitting: Nurse Practitioner

## 2020-04-13 NOTE — Progress Notes (Signed)
Subjective:    Patient ID: [name], female    DOB: 11-Jun-1945, 75 y.o.   MRN: [id number] Chief Complaint  Patient presents with  . Follow-up    Unna boot check    [name] is a 75 year old female that presents today for evaluation of a right lower extremity ulceration.  The patient had a fall and subsequently developed a wound on her right lower extremity.  Today, the area is doing very well in terms of healing however it is still not completely healed.  The patient also has severe edema on her right lower extremity.  The swelling on that leg is greatly improved.  The patient was also on a short course of antibiotics to treat cellulitis following her wound.  Overall the patient is doing much better in regards to her wound.  She denies any fever, chills nausea or vomiting.   Review of Systems  Cardiovascular: Positive for leg swelling.  Skin: Positive for wound.  Neurological: Positive for weakness.       Objective:   Physical Exam Vitals reviewed.  Constitutional:      Appearance: She is obese.  HENT:     Head: Normocephalic.  Cardiovascular:     Rate and Rhythm: Normal rate.     Pulses: Decreased pulses.  Pulmonary:     Effort: Pulmonary effort is normal.  Skin:    General: Skin is warm and dry.  Neurological:     Mental Status: She is alert and oriented to person, place, and time.     Motor: Weakness present.     Gait: Gait abnormal.  Psychiatric:        Mood and Affect: Mood normal.        Behavior: Behavior normal.        Thought Content: Thought content normal.        Judgment: Judgment normal.     BP (!) 151/73 (BP Location: Left Arm)   Pulse 91   Resp 18   Ht 5\' 6"  (1.676 m)   Wt 298 lb (135.2 kg)   BMI 48.10 kg/m   Past Medical History:  Diagnosis Date  . Anemia    as young child  . Arthritis    back, leg and foot  . Blood transfusion    1964  . Carpal tunnel syndrome   . Diabetes mellitus    oral medications  . Hypertension      Social History   Socioeconomic History  . Marital status: Married    Spouse name: Not on file  . Number of children: Not on file  . Years of education: Not on file  . Highest education level: Not on file  Occupational History  . Not on file  Tobacco Use  . Smoking status: Never Smoker  . Smokeless tobacco: Never Used  Substance and Sexual Activity  . Alcohol use: No  . Drug use: No  . Sexual activity: Not on file  Other Topics Concern  . Not on file  Social History Narrative  . Not on file   Social Determinants of Health   Financial Resource Strain: Not on file  Food Insecurity: Not on file  Transportation Needs: Not on file  Physical Activity: Not on file  Stress: Not on file  Social Connections: Not on file  Intimate Partner Violence: Not on file    Past Surgical History:  Procedure Laterality Date  . ABDOMINAL HYSTERECTOMY    . ANTERIOR CERVICAL DECOMP/DISCECTOMY FUSION  04/27/2011  Procedure: ANTERIOR CERVICAL DECOMPRESSION/DISCECTOMY FUSION 2 LEVELS;  Surgeon: Mariam Dollar, MD;  Location: [facility];  Service: Neurosurgery;  Laterality: N/A;  Anterior Cervical Decompression/ Discectomy Fusion Cervical Three-Four, Cervical Four-Five  . APPENDECTOMY    . KNEE ARTHROPLASTY     right  . PERIPHERAL VASCULAR CATHETERIZATION Left 10/29/2014   Procedure: Lower Extremity Angiography;  Surgeon: Annice Needy, MD;  Location: [facility];  Service: Cardiovascular;  Laterality: Left;  . PERIPHERAL VASCULAR CATHETERIZATION Left 10/29/2014   Procedure: Lower Extremity Intervention;  Surgeon: Annice Needy, MD;  Location: [facility];  Service: Cardiovascular;  Laterality: Left;  . PERIPHERAL VASCULAR CATHETERIZATION Left 03/11/2015   Procedure: Lower Extremity Angiography;  Surgeon: Annice Needy, MD;  Location: [facility];  Service: Cardiovascular;  Laterality: Left;  . PERIPHERAL VASCULAR CATHETERIZATION Left 03/11/2015   Procedure: Lower Extremity  Intervention;  Surgeon: Annice Needy, MD;  Location: [facility];  Service: Cardiovascular;  Laterality: Left;    Family History  Problem Relation Age of Onset  . Cancer Mother   . Hypertension Mother   . Diabetes Paternal Grandmother     Allergies  Allergen Reactions  . Penicillins Shortness Of Breath and Swelling  . Cheese Itching  . Codeine Other (See Comments)    Ringing in ear TOLERATES Tramadol  . Strawberry (Diagnostic) Hives    CBC Latest Ref Rng & Units 06/09/2016 08/10/2013 04/22/2011  WBC 3.6 - 11.0 K/uL 2.3(L) 4.0 8.2  Hemoglobin 12.0 - 16.0 g/dL 11.8(L) 11.5(L) 12.4  Hematocrit 35.0 - 47.0 % 35.9 36.1 37.0  Platelets 150 - 440 K/uL 151 196 228      CMP     Component Value Date/Time   NA 139 06/09/2016 1832   NA 139 08/10/2013 1315   K 3.4 (L) 06/09/2016 1832   K 3.7 08/10/2013 1315   CL 100 (L) 06/09/2016 1832   CL 105 08/10/2013 1315   CO2 29 06/09/2016 1832   CO2 28 08/10/2013 1315   GLUCOSE 295 (H) 06/09/2016 1832   GLUCOSE 310 (H) 08/10/2013 1315   BUN 27 (H) 06/09/2016 1832   BUN 15 08/10/2013 1315   CREATININE 1.65 (H) 06/09/2016 1832   CREATININE 1.15 08/10/2013 1315   CALCIUM 8.3 (L) 06/09/2016 1832   CALCIUM 8.8 08/10/2013 1315   PROT 6.8 06/09/2016 1832   ALBUMIN 3.4 (L) 06/09/2016 1832   AST 41 06/09/2016 1832   ALT 28 06/09/2016 1832   ALKPHOS 73 06/09/2016 1832   BILITOT 0.4 06/09/2016 1832   GFRNONAA 30 (L) 06/09/2016 1832   GFRNONAA 49 (L) 08/10/2013 1315   GFRAA 35 (L) 06/09/2016 1832   GFRAA 56 (L) 08/10/2013 1315     No results found.     Assessment & Plan:   1. Lower limb ulcer, ankle, left, limited to breakdown of skin (HCC) The patient's wound is nearly healed at this time.  She still has some rawness to her wound.  We will continue to place the patient in Unna wraps on a weekly basis.  We will reevaluate the patient's wound in 4 weeks.  2. Benign essential hypertension Continue antihypertensive medications as  already ordered, these medications have been reviewed and there are no changes at this time.       Current Outpatient Medications on File Prior to Visit  Medication Sig Dispense Refill  . aspirin EC 81 MG tablet Take 81 mg by mouth daily.    . celecoxib (  CELEBREX) 200 MG capsule Take 200 mg by mouth daily.    . clopidogrel (PLAVIX) 75 MG tablet Take 1 tablet (75 mg total) by mouth daily. 30 tablet 11  . furosemide (LASIX) 20 MG tablet Take 20 mg by mouth daily.    Marland Kitchen glipiZIDE (GLUCOTROL XL) 5 MG 24 hr tablet Take 5 mg by mouth 2 (two) times daily.    Marland Kitchen liraglutide (VICTOZA) 18 MG/3ML SOPN Inject into the skin.    Marland Kitchen lisinopril-hydrochlorothiazide (PRINZIDE,ZESTORETIC) 20-25 MG per tablet Take 1 tablet by mouth daily.    . metFORMIN (GLUCOPHAGE) 1000 MG tablet Take 1,000 mg by mouth 2 (two) times daily with a meal.    . methocarbamol (ROBAXIN) 500 MG tablet Take 1 tablet (500 mg total) by mouth 4 (four) times daily. 20 tablet 0  . mupirocin ointment (BACTROBAN) 2 % Place 1 application on wound daily 22 g 0  . ONETOUCH VERIO test strip 1 each 2 (two) times daily.    . pregabalin (LYRICA) 25 MG capsule Take 50 mg by mouth 3 (three) times daily.     . rosuvastatin (CRESTOR) 5 MG tablet Take 5 mg by mouth daily.    Marland Kitchen sulfamethoxazole-trimethoprim (BACTRIM DS) 800-160 MG tablet Take 1 tablet by mouth 2 (two) times daily. 14 tablet 0  . traMADol (ULTRAM) 50 MG tablet Take 1 tablet (50 mg total) by mouth every 6 (six) hours as needed. 20 tablet 0   No current facility-administered medications on file prior to visit.    There are no Patient Instructions on file for this visit. No follow-ups on file.   Georgiana Spinner, NP

## 2020-04-19 ENCOUNTER — Encounter (INDEPENDENT_AMBULATORY_CARE_PROVIDER_SITE_OTHER): Payer: Self-pay

## 2020-04-19 ENCOUNTER — Ambulatory Visit (INDEPENDENT_AMBULATORY_CARE_PROVIDER_SITE_OTHER): Payer: Medicare HMO | Admitting: Nurse Practitioner

## 2020-04-19 ENCOUNTER — Other Ambulatory Visit: Payer: Self-pay

## 2020-04-19 VITALS — BP 152/65 | HR 67 | Resp 16

## 2020-04-19 DIAGNOSIS — L97321 Non-pressure chronic ulcer of left ankle limited to breakdown of skin: Secondary | ICD-10-CM | POA: Diagnosis not present

## 2020-04-19 NOTE — Progress Notes (Signed)
History of Present Illness  There is no documented history at this time  Assessments & Plan   There are no diagnoses linked to this encounter.    Additional instructions  Subjective:  Patient presents with venous ulcer of the Bilateral lower extremity.    Procedure:  3 layer unna wrap was placed Bilateral lower extremity.   Plan:   Follow up in one week.  

## 2020-04-26 ENCOUNTER — Encounter (INDEPENDENT_AMBULATORY_CARE_PROVIDER_SITE_OTHER): Payer: Medicare HMO

## 2020-04-26 ENCOUNTER — Other Ambulatory Visit: Payer: Self-pay

## 2020-04-26 ENCOUNTER — Ambulatory Visit (INDEPENDENT_AMBULATORY_CARE_PROVIDER_SITE_OTHER): Payer: Medicare HMO | Admitting: Nurse Practitioner

## 2020-04-26 ENCOUNTER — Encounter (INDEPENDENT_AMBULATORY_CARE_PROVIDER_SITE_OTHER): Payer: Self-pay

## 2020-04-26 VITALS — BP 155/73 | HR 73 | Resp 16

## 2020-04-26 DIAGNOSIS — L97321 Non-pressure chronic ulcer of left ankle limited to breakdown of skin: Secondary | ICD-10-CM | POA: Diagnosis not present

## 2020-04-26 NOTE — Progress Notes (Signed)
History of Present Illness  There is no documented history at this time  Assessments & Plan   There are no diagnoses linked to this encounter.    Additional instructions  Subjective:  Patient presents with venous ulcer of the Bilateral lower extremity.    Procedure:  3 layer unna wrap was placed Bilateral lower extremity.   Plan:   Follow up in one week.  

## 2020-05-03 ENCOUNTER — Ambulatory Visit (INDEPENDENT_AMBULATORY_CARE_PROVIDER_SITE_OTHER): Payer: Medicare HMO | Admitting: Nurse Practitioner

## 2020-05-03 ENCOUNTER — Other Ambulatory Visit: Payer: Self-pay

## 2020-05-03 ENCOUNTER — Encounter (INDEPENDENT_AMBULATORY_CARE_PROVIDER_SITE_OTHER): Payer: Self-pay | Admitting: Nurse Practitioner

## 2020-05-03 VITALS — BP 146/75 | HR 79 | Resp 16

## 2020-05-03 DIAGNOSIS — I10 Essential (primary) hypertension: Secondary | ICD-10-CM | POA: Diagnosis not present

## 2020-05-03 DIAGNOSIS — I890 Lymphedema, not elsewhere classified: Secondary | ICD-10-CM | POA: Diagnosis not present

## 2020-05-03 DIAGNOSIS — L97321 Non-pressure chronic ulcer of left ankle limited to breakdown of skin: Secondary | ICD-10-CM

## 2020-05-03 NOTE — Progress Notes (Signed)
Subjective:    Patient ID: [name], female    DOB: 17-Mar-1945, 75 y.o.   MRN: [id number] Chief Complaint  Patient presents with  . Follow-up    Unna check    [name] is a 75 year old female that presents today for evaluation of lower extremity swelling and wounds.  The patient previously had a wound on her left lower extremity.  That wound is now healed.  The patient also had evidence of cellulitis bilaterally and that has also resolved.  The patient notes that her legs feel much better.  The patient does continue to have some swelling but she also engages in conservative therapy.  She denies any fever or chills.   Review of Systems  Cardiovascular: Positive for leg swelling.  Skin: Negative for wound.  Neurological: Positive for weakness.  All other systems reviewed and are negative.      Objective:   Physical Exam Vitals reviewed.  Constitutional:      Appearance: She is obese.  HENT:     Head: Normocephalic.  Cardiovascular:     Rate and Rhythm: Normal rate.     Pulses: Decreased pulses.  Pulmonary:     Effort: Pulmonary effort is normal.  Musculoskeletal:     Right lower leg: Edema present.     Left lower leg: Edema present.  Neurological:     Mental Status: She is alert and oriented to person, place, and time.     Motor: Weakness present.     Gait: Gait abnormal.  Psychiatric:        Mood and Affect: Mood normal.        Behavior: Behavior normal.        Thought Content: Thought content normal.        Judgment: Judgment normal.     BP (!) 146/75 (BP Location: Left Arm)   Pulse 79   Resp 16   Past Medical History:  Diagnosis Date  . Anemia    as young child  . Arthritis    back, leg and foot  . Blood transfusion    1964  . Carpal tunnel syndrome   . Diabetes mellitus    oral medications  . Hypertension     Social History   Socioeconomic History  . Marital status: Married    Spouse name: Not on file  . Number of children: Not on file   . Years of education: Not on file  . Highest education level: Not on file  Occupational History  . Not on file  Tobacco Use  . Smoking status: Never Smoker  . Smokeless tobacco: Never Used  Substance and Sexual Activity  . Alcohol use: No  . Drug use: No  . Sexual activity: Not on file  Other Topics Concern  . Not on file  Social History Narrative  . Not on file   Social Determinants of Health   Financial Resource Strain: Not on file  Food Insecurity: Not on file  Transportation Needs: Not on file  Physical Activity: Not on file  Stress: Not on file  Social Connections: Not on file  Intimate Partner Violence: Not on file    Past Surgical History:  Procedure Laterality Date  . ABDOMINAL HYSTERECTOMY    . ANTERIOR CERVICAL DECOMP/DISCECTOMY FUSION  04/27/2011   Procedure: ANTERIOR CERVICAL DECOMPRESSION/DISCECTOMY FUSION 2 LEVELS;  Surgeon: Mariam Dollar, MD;  Location: [facility];  Service: Neurosurgery;  Laterality: N/A;  Anterior Cervical Decompression/ Discectomy Fusion Cervical Three-Four, Cervical  Four-Five  . APPENDECTOMY    . KNEE ARTHROPLASTY     right  . PERIPHERAL VASCULAR CATHETERIZATION Left 10/29/2014   Procedure: Lower Extremity Angiography;  Surgeon: Annice Needy, MD;  Location: [facility];  Service: Cardiovascular;  Laterality: Left;  . PERIPHERAL VASCULAR CATHETERIZATION Left 10/29/2014   Procedure: Lower Extremity Intervention;  Surgeon: Annice Needy, MD;  Location: [facility];  Service: Cardiovascular;  Laterality: Left;  . PERIPHERAL VASCULAR CATHETERIZATION Left 03/11/2015   Procedure: Lower Extremity Angiography;  Surgeon: Annice Needy, MD;  Location: [facility];  Service: Cardiovascular;  Laterality: Left;  . PERIPHERAL VASCULAR CATHETERIZATION Left 03/11/2015   Procedure: Lower Extremity Intervention;  Surgeon: Annice Needy, MD;  Location: [facility];  Service: Cardiovascular;  Laterality: Left;    Family History   Problem Relation Age of Onset  . Cancer Mother   . Hypertension Mother   . Diabetes Paternal Grandmother     Allergies  Allergen Reactions  . Penicillins Shortness Of Breath and Swelling  . Cheese Itching  . Codeine Other (See Comments)    Ringing in ear TOLERATES Tramadol  . Strawberry (Diagnostic) Hives    CBC Latest Ref Rng & Units 06/09/2016 08/10/2013 04/22/2011  WBC 3.6 - 11.0 K/uL 2.3(L) 4.0 8.2  Hemoglobin 12.0 - 16.0 g/dL 11.8(L) 11.5(L) 12.4  Hematocrit 35.0 - 47.0 % 35.9 36.1 37.0  Platelets 150 - 440 K/uL 151 196 228      CMP     Component Value Date/Time   NA 139 06/09/2016 1832   NA 139 08/10/2013 1315   K 3.4 (L) 06/09/2016 1832   K 3.7 08/10/2013 1315   CL 100 (L) 06/09/2016 1832   CL 105 08/10/2013 1315   CO2 29 06/09/2016 1832   CO2 28 08/10/2013 1315   GLUCOSE 295 (H) 06/09/2016 1832   GLUCOSE 310 (H) 08/10/2013 1315   BUN 27 (H) 06/09/2016 1832   BUN 15 08/10/2013 1315   CREATININE 1.65 (H) 06/09/2016 1832   CREATININE 1.15 08/10/2013 1315   CALCIUM 8.3 (L) 06/09/2016 1832   CALCIUM 8.8 08/10/2013 1315   PROT 6.8 06/09/2016 1832   ALBUMIN 3.4 (L) 06/09/2016 1832   AST 41 06/09/2016 1832   ALT 28 06/09/2016 1832   ALKPHOS 73 06/09/2016 1832   BILITOT 0.4 06/09/2016 1832   GFRNONAA 30 (L) 06/09/2016 1832   GFRNONAA 49 (L) 08/10/2013 1315   GFRAA 35 (L) 06/09/2016 1832   GFRAA 56 (L) 08/10/2013 1315     No results found.     Assessment & Plan:   1. Lower limb ulcer, ankle, left, limited to breakdown of skin (HCC) Lower limb ulceration has resolved  2. Benign essential hypertension Continue antihypertensive medications as already ordered, these medications have been reviewed and there are no changes at this time.   3. Lymphedema Patient is advised to continue with conservative therapy including use of her medical grade 1 compression stockings, elevation and lymphedema pump.  Activity unfortunately is difficult for the patient.   Providers no more episodes of cellulitis or ulceration, we will have the patient return to the office in 6 months.   Current Outpatient Medications on File Prior to Visit  Medication Sig Dispense Refill  . aspirin EC 81 MG tablet Take 81 mg by mouth daily.    . celecoxib (CELEBREX) 200 MG capsule Take 200 mg by mouth daily.    . clopidogrel (PLAVIX) 75 MG tablet Take 1  tablet (75 mg total) by mouth daily. 30 tablet 11  . furosemide (LASIX) 20 MG tablet Take 20 mg by mouth daily.    Marland Kitchen glipiZIDE (GLUCOTROL XL) 5 MG 24 hr tablet Take 5 mg by mouth 2 (two) times daily.    Marland Kitchen liraglutide (VICTOZA) 18 MG/3ML SOPN Inject into the skin.    Marland Kitchen lisinopril-hydrochlorothiazide (PRINZIDE,ZESTORETIC) 20-25 MG per tablet Take 1 tablet by mouth daily.    . metFORMIN (GLUCOPHAGE) 1000 MG tablet Take 1,000 mg by mouth 2 (two) times daily with a meal.    . mupirocin ointment (BACTROBAN) 2 % Place 1 application on wound daily 22 g 0  . ONETOUCH VERIO test strip 1 each 2 (two) times daily.    . pregabalin (LYRICA) 25 MG capsule Take 50 mg by mouth 3 (three) times daily.     . rosuvastatin (CRESTOR) 5 MG tablet Take 5 mg by mouth daily.    . methocarbamol (ROBAXIN) 500 MG tablet Take 1 tablet (500 mg total) by mouth 4 (four) times daily. (Patient not taking: Reported on 05/03/2020) 20 tablet 0  . sulfamethoxazole-trimethoprim (BACTRIM DS) 800-160 MG tablet Take 1 tablet by mouth 2 (two) times daily. (Patient not taking: No sig reported) 14 tablet 0  . traMADol (ULTRAM) 50 MG tablet Take 1 tablet (50 mg total) by mouth every 6 (six) hours as needed. (Patient not taking: Reported on 05/03/2020) 20 tablet 0   No current facility-administered medications on file prior to visit.    There are no Patient Instructions on file for this visit. No follow-ups on file.   Georgiana Spinner, NP

## 2020-06-07 ENCOUNTER — Other Ambulatory Visit: Payer: Self-pay

## 2020-06-07 ENCOUNTER — Encounter (INDEPENDENT_AMBULATORY_CARE_PROVIDER_SITE_OTHER): Payer: Self-pay | Admitting: Vascular Surgery

## 2020-06-07 ENCOUNTER — Ambulatory Visit (INDEPENDENT_AMBULATORY_CARE_PROVIDER_SITE_OTHER): Payer: Medicare HMO | Admitting: Vascular Surgery

## 2020-06-07 VITALS — BP 111/71 | HR 76 | Resp 16

## 2020-06-07 DIAGNOSIS — E11622 Type 2 diabetes mellitus with other skin ulcer: Secondary | ICD-10-CM

## 2020-06-07 DIAGNOSIS — I10 Essential (primary) hypertension: Secondary | ICD-10-CM

## 2020-06-07 DIAGNOSIS — L97211 Non-pressure chronic ulcer of right calf limited to breakdown of skin: Secondary | ICD-10-CM

## 2020-06-07 DIAGNOSIS — R600 Localized edema: Secondary | ICD-10-CM | POA: Diagnosis not present

## 2020-06-07 DIAGNOSIS — L98499 Non-pressure chronic ulcer of skin of other sites with unspecified severity: Secondary | ICD-10-CM

## 2020-06-07 DIAGNOSIS — I890 Lymphedema, not elsewhere classified: Secondary | ICD-10-CM

## 2020-06-07 NOTE — Assessment & Plan Note (Signed)
Control is poor currently.  Has not been able to wear compression stockings regularly.

## 2020-06-07 NOTE — Assessment & Plan Note (Signed)
Control is poor right now.  Were going to get her in Unna boots to get the swelling under control and the skin healed.  See her back in 3 to 4 weeks.  Change the Unna boots weekly.

## 2020-06-07 NOTE — Assessment & Plan Note (Signed)
blood pressure control important in reducing the progression of atherosclerotic disease. On appropriate oral medications.  

## 2020-06-07 NOTE — Assessment & Plan Note (Signed)
blood glucose control important in reducing the progression of atherosclerotic disease. Also, involved in wound healing. On appropriate medications.  

## 2020-06-07 NOTE — Progress Notes (Signed)
MRN : [id number]  [name] is a 75 y.o. (August 20, 1945) female who presents with chief complaint of  Chief Complaint  Patient presents with  . Follow-up    6 month follow up  .  History of Present Illness: Patient returns today in follow up of her leg swelling.  She has developed blistering on both lower extremities and some small scabs on the left leg.  Her swelling is worse.  Her mobility is poor.  Her knees are very bad.  No fevers or chills.  No signs of systemic infection.  This has been a recurring issue for her.  She has not been able to keep her compression stockings on and off due to difficulties getting them on.  Current Outpatient Medications  Medication Sig Dispense Refill  . aspirin EC 81 MG tablet Take 81 mg by mouth daily.    . celecoxib (CELEBREX) 200 MG capsule Take 200 mg by mouth daily.    . clopidogrel (PLAVIX) 75 MG tablet Take 1 tablet (75 mg total) by mouth daily. 30 tablet 11  . furosemide (LASIX) 20 MG tablet Take 20 mg by mouth daily.    Marland Kitchen glipiZIDE (GLUCOTROL XL) 5 MG 24 hr tablet Take 5 mg by mouth 2 (two) times daily.    Marland Kitchen liraglutide (VICTOZA) 18 MG/3ML SOPN Inject into the skin.    Marland Kitchen lisinopril-hydrochlorothiazide (PRINZIDE,ZESTORETIC) 20-25 MG per tablet Take 1 tablet by mouth daily.    . metFORMIN (GLUCOPHAGE) 1000 MG tablet Take 1,000 mg by mouth 2 (two) times daily with a meal.    . metoprolol succinate (TOPROL-XL) 25 MG 24 hr tablet Take 1 tablet by mouth daily.    . mupirocin ointment (BACTROBAN) 2 % Place 1 application on wound daily 22 g 0  . ONETOUCH VERIO test strip 1 each 2 (two) times daily.    Marland Kitchen OZEMPIC, 0.25 OR 0.5 MG/DOSE, 2 MG/1.5ML SOPN Inject into the skin.    . pregabalin (LYRICA) 25 MG capsule Take 50 mg by mouth 3 (three) times daily.     . rosuvastatin (CRESTOR) 5 MG tablet Take 5 mg by mouth daily.    . methocarbamol (ROBAXIN) 500 MG tablet Take 1 tablet (500 mg total) by mouth 4 (four) times daily. (Patient not taking: No sig  reported) 20 tablet 0  . sulfamethoxazole-trimethoprim (BACTRIM DS) 800-160 MG tablet Take 1 tablet by mouth 2 (two) times daily. (Patient not taking: No sig reported) 14 tablet 0  . traMADol (ULTRAM) 50 MG tablet Take 1 tablet (50 mg total) by mouth every 6 (six) hours as needed. (Patient not taking: No sig reported) 20 tablet 0   No current facility-administered medications for this visit.    Past Medical History:  Diagnosis Date  . Anemia    as young child  . Arthritis    back, leg and foot  . Blood transfusion    1964  . Carpal tunnel syndrome   . Diabetes mellitus    oral medications  . Hypertension     Past Surgical History:  Procedure Laterality Date  . ABDOMINAL HYSTERECTOMY    . ANTERIOR CERVICAL DECOMP/DISCECTOMY FUSION  04/27/2011   Procedure: ANTERIOR CERVICAL DECOMPRESSION/DISCECTOMY FUSION 2 LEVELS;  Surgeon: Mariam Dollar, MD;  Location: [facility];  Service: Neurosurgery;  Laterality: N/A;  Anterior Cervical Decompression/ Discectomy Fusion Cervical Three-Four, Cervical Four-Five  . APPENDECTOMY    . KNEE ARTHROPLASTY     right  . PERIPHERAL VASCULAR CATHETERIZATION Left 10/29/2014  Procedure: Lower Extremity Angiography;  Surgeon: Annice Needy, MD;  Location: [facility];  Service: Cardiovascular;  Laterality: Left;  . PERIPHERAL VASCULAR CATHETERIZATION Left 10/29/2014   Procedure: Lower Extremity Intervention;  Surgeon: Annice Needy, MD;  Location: [facility];  Service: Cardiovascular;  Laterality: Left;  . PERIPHERAL VASCULAR CATHETERIZATION Left 03/11/2015   Procedure: Lower Extremity Angiography;  Surgeon: Annice Needy, MD;  Location: [facility];  Service: Cardiovascular;  Laterality: Left;  . PERIPHERAL VASCULAR CATHETERIZATION Left 03/11/2015   Procedure: Lower Extremity Intervention;  Surgeon: Annice Needy, MD;  Location: [facility];  Service: Cardiovascular;  Laterality: Left;     Social History   Tobacco Use  .  Smoking status: Never Smoker  . Smokeless tobacco: Never Used  Substance Use Topics  . Alcohol use: No  . Drug use: No      Family History  Problem Relation Age of Onset  . Cancer Mother   . Hypertension Mother   . Diabetes Paternal Grandmother      Allergies  Allergen Reactions  . Penicillins Shortness Of Breath and Swelling  . Cheese Itching  . Codeine Other (See Comments)    Ringing in ear TOLERATES Tramadol  . Strawberry (Diagnostic) Hives     REVIEW OF SYSTEMS (Negative unless checked)  Constitutional: [] Weight loss  [] Fever  [] Chills Cardiac: [] Chest pain   [] Chest pressure   [] Palpitations   [] Shortness of breath when laying flat   [] Shortness of breath at rest   [x] Shortness of breath with exertion. Vascular:  [x] Pain in legs with walking   [x] Pain in legs at rest   [] Pain in legs when laying flat   [] Claudication   [] Pain in feet when walking  [] Pain in feet at rest  [] Pain in feet when laying flat   [] History of DVT   [] Phlebitis   [x] Swelling in legs   [] Varicose veins   [x] Non-healing ulcers Pulmonary:   [] Uses home oxygen   [] Productive cough   [] Hemoptysis   [] Wheeze  [] COPD   [] Asthma Neurologic:  [] Dizziness  [] Blackouts   [] Seizures   [] History of stroke   [] History of TIA  [] Aphasia   [] Temporary blindness   [] Dysphagia   [] Weakness or numbness in arms   [] Weakness or numbness in legs Musculoskeletal:  [x] Arthritis   [] Joint swelling   [x] Joint pain   [] Low back pain Hematologic:  [] Easy bruising  [] Easy bleeding   [] Hypercoagulable state   [] Anemic   Gastrointestinal:  [] Blood in stool   [] Vomiting blood  [] Gastroesophageal reflux/heartburn   [] Abdominal pain Genitourinary:  [x] Chronic kidney disease   [] Difficult urination  [] Frequent urination  [] Burning with urination   [] Hematuria Skin:  [] Rashes   [x] Ulcers   [x] Wounds Psychological:  [] History of anxiety   []  History of major depression.  Physical Examination  BP 111/71 (BP Location: Right Arm)    Pulse 76   Resp 16  Gen:  WD/WN, NAD. obese Head: [place]/AT, No temporalis wasting. Ear/Nose/Throat: Hearing grossly intact, nares w/o erythema or drainage Eyes: Conjunctiva clear. Sclera non-icteric Neck: Supple.  Trachea midline Pulmonary:  Good air movement, no use of accessory muscles.  Cardiac: Irregular Vascular:  Vessel Right Left  Radial Palpable Palpable                          PT Not Palpable Not Palpable  DP Not Palpable Not Palpable   Gastrointestinal: soft, non-tender/non-distended. No guarding/reflex.  Musculoskeletal: M/S 5/5 throughout.  No deformity or atrophy.  Blistering of the skin bilaterally with some weeping and few scabs on the left lower extremity.  Woody induration is present.  3+ bilateral lower extremity edema. Neurologic: Sensation grossly intact in extremities.  Symmetrical.  Speech is fluent.  Psychiatric: Judgment intact, Mood & affect appropriate for pt's clinical situation. Dermatologic: Skin changes to both lower legs as above..       Labs No results found for this or any previous visit (from the past 2160 hour(s)).  Radiology No results found.  Assessment/Plan  Lower limb ulcer, calf, right, limited to breakdown of skin (HCC) With blistering skin bilaterally, 3 layer Unna boots were placed today and will be changed weekly.  Reassess in 3 to 4 weeks.  Bilateral lower extremity edema Control is poor right now.  Were going to get her in Unna boots to get the swelling under control and the skin healed.  See her back in 3 to 4 weeks.  Change the Unna boots weekly.  Diabetes (HCC) blood glucose control important in reducing the progression of atherosclerotic disease. Also, involved in wound healing. On appropriate medications.   Essential hypertension, benign blood pressure control important in reducing the progression of atherosclerotic disease. On appropriate oral medications.   Lymphedema Control is poor currently.  Has not been  able to wear compression stockings regularly.    Festus Barren, MD  06/07/2020 12:19 PM    This note was created with Dragon medical transcription system.  Any errors from dictation are purely unintentional

## 2020-06-07 NOTE — Assessment & Plan Note (Signed)
With blistering skin bilaterally, 3 layer Unna boots were placed today and will be changed weekly.  Reassess in 3 to 4 weeks.

## 2020-06-14 ENCOUNTER — Encounter (INDEPENDENT_AMBULATORY_CARE_PROVIDER_SITE_OTHER): Payer: Self-pay

## 2020-06-14 ENCOUNTER — Ambulatory Visit (INDEPENDENT_AMBULATORY_CARE_PROVIDER_SITE_OTHER): Payer: Medicare HMO | Admitting: Nurse Practitioner

## 2020-06-14 ENCOUNTER — Other Ambulatory Visit: Payer: Self-pay

## 2020-06-14 VITALS — BP 117/70 | HR 81 | Resp 16

## 2020-06-14 DIAGNOSIS — L97211 Non-pressure chronic ulcer of right calf limited to breakdown of skin: Secondary | ICD-10-CM

## 2020-06-14 NOTE — Progress Notes (Signed)
History of Present Illness  There is no documented history at this time  Assessments & Plan   There are no diagnoses linked to this encounter.    Additional instructions  Subjective:  Patient presents with venous ulcer of the Bilateral lower extremity.    Procedure:  3 layer unna wrap was placed Bilateral lower extremity.   Plan:   Follow up in one week.  

## 2020-06-16 ENCOUNTER — Encounter (INDEPENDENT_AMBULATORY_CARE_PROVIDER_SITE_OTHER): Payer: Self-pay | Admitting: Nurse Practitioner

## 2020-06-21 ENCOUNTER — Encounter (INDEPENDENT_AMBULATORY_CARE_PROVIDER_SITE_OTHER): Payer: Self-pay

## 2020-06-21 ENCOUNTER — Other Ambulatory Visit: Payer: Self-pay

## 2020-06-21 ENCOUNTER — Ambulatory Visit (INDEPENDENT_AMBULATORY_CARE_PROVIDER_SITE_OTHER): Payer: Medicare HMO | Admitting: Nurse Practitioner

## 2020-06-21 VITALS — BP 143/74 | HR 77 | Resp 16

## 2020-06-21 DIAGNOSIS — L97211 Non-pressure chronic ulcer of right calf limited to breakdown of skin: Secondary | ICD-10-CM

## 2020-06-21 NOTE — Progress Notes (Signed)
History of Present Illness  There is no documented history at this time  Assessments & Plan   There are no diagnoses linked to this encounter.    Additional instructions  Subjective:  Patient presents with venous ulcer of the Bilateral lower extremity.    Procedure:  3 layer unna wrap was placed Bilateral lower extremity.   Plan:   Follow up in one week.  

## 2020-06-28 ENCOUNTER — Ambulatory Visit (INDEPENDENT_AMBULATORY_CARE_PROVIDER_SITE_OTHER): Payer: Medicare HMO | Admitting: Nurse Practitioner

## 2020-06-28 ENCOUNTER — Other Ambulatory Visit: Payer: Self-pay

## 2020-06-28 ENCOUNTER — Encounter (INDEPENDENT_AMBULATORY_CARE_PROVIDER_SITE_OTHER): Payer: Self-pay

## 2020-06-28 VITALS — BP 150/71 | HR 72 | Resp 16

## 2020-06-28 DIAGNOSIS — L97211 Non-pressure chronic ulcer of right calf limited to breakdown of skin: Secondary | ICD-10-CM | POA: Diagnosis not present

## 2020-06-28 NOTE — Progress Notes (Signed)
History of Present Illness  There is no documented history at this time  Assessments & Plan   There are no diagnoses linked to this encounter.    Additional instructions  Subjective:  Patient presents with venous ulcer of the Bilateral lower extremity.    Procedure:  3 layer unna wrap was placed Bilateral lower extremity.   Plan:   Follow up in one week.  

## 2020-06-29 ENCOUNTER — Encounter (INDEPENDENT_AMBULATORY_CARE_PROVIDER_SITE_OTHER): Payer: Self-pay | Admitting: Nurse Practitioner

## 2020-07-05 ENCOUNTER — Ambulatory Visit (INDEPENDENT_AMBULATORY_CARE_PROVIDER_SITE_OTHER): Payer: Medicare HMO | Admitting: Nurse Practitioner

## 2020-07-05 ENCOUNTER — Encounter (INDEPENDENT_AMBULATORY_CARE_PROVIDER_SITE_OTHER): Payer: Self-pay | Admitting: Nurse Practitioner

## 2020-07-05 ENCOUNTER — Other Ambulatory Visit: Payer: Self-pay

## 2020-07-05 VITALS — BP 122/60 | HR 76 | Ht 65.0 in | Wt 289.0 lb

## 2020-07-05 DIAGNOSIS — L97211 Non-pressure chronic ulcer of right calf limited to breakdown of skin: Secondary | ICD-10-CM

## 2020-07-05 DIAGNOSIS — L98499 Non-pressure chronic ulcer of skin of other sites with unspecified severity: Secondary | ICD-10-CM | POA: Diagnosis not present

## 2020-07-05 DIAGNOSIS — I10 Essential (primary) hypertension: Secondary | ICD-10-CM

## 2020-07-05 DIAGNOSIS — E11622 Type 2 diabetes mellitus with other skin ulcer: Secondary | ICD-10-CM

## 2020-07-06 ENCOUNTER — Encounter (INDEPENDENT_AMBULATORY_CARE_PROVIDER_SITE_OTHER): Payer: Self-pay | Admitting: Nurse Practitioner

## 2020-07-06 NOTE — Progress Notes (Signed)
Subjective:    Patient ID: [name], female    DOB: August 21, 1945, 75 y.o.   MRN: [id number] Chief Complaint  Patient presents with  . Follow-up    F/u unna boot check    [name] is a 75 year old female that presents today for follow-up regards to lower extremity edema and ulceration.  Today the ulceration is mostly healed.  She still has some persistent swelling in her left foot.  The patient does not have any compression at home.  Her son notes that when she comes out of Unna wraps within a week or so the patient begins to swell and blister.  These blisters then turn into ulcerations.  She has tolerated the Unna wraps well.  Overall patient is doing well.   Review of Systems  Musculoskeletal: Positive for gait problem.  All other systems reviewed and are negative.      Objective:   Physical Exam Vitals reviewed.  Constitutional:      Appearance: She is obese.  HENT:     Head: Normocephalic.  Cardiovascular:     Rate and Rhythm: Normal rate.     Pulses: Normal pulses.  Pulmonary:     Effort: Pulmonary effort is normal.  Musculoskeletal:     Right lower leg: 2+ Edema present.     Left lower leg: 2+ Edema present.  Skin:    General: Skin is warm and dry.  Neurological:     Mental Status: She is alert and oriented to person, place, and time.  Psychiatric:        Mood and Affect: Mood normal.        Behavior: Behavior normal.        Thought Content: Thought content normal.        Judgment: Judgment normal.     BP 122/60   Pulse 76   Ht 5\' 5"  (1.651 m)   Wt 289 lb (131.1 kg)   BMI 48.09 kg/m   Past Medical History:  Diagnosis Date  . Anemia    as young child  . Arthritis    back, leg and foot  . Blood transfusion    1964  . Carpal tunnel syndrome   . Diabetes mellitus    oral medications  . Hypertension     Social History   Socioeconomic History  . Marital status: Married    Spouse name: Not on file  . Number of children: Not on file  . Years  of education: Not on file  . Highest education level: Not on file  Occupational History  . Not on file  Tobacco Use  . Smoking status: Never Smoker  . Smokeless tobacco: Never Used  Substance and Sexual Activity  . Alcohol use: No  . Drug use: No  . Sexual activity: Not on file  Other Topics Concern  . Not on file  Social History Narrative  . Not on file   Social Determinants of Health   Financial Resource Strain: Not on file  Food Insecurity: Not on file  Transportation Needs: Not on file  Physical Activity: Not on file  Stress: Not on file  Social Connections: Not on file  Intimate Partner Violence: Not on file    Past Surgical History:  Procedure Laterality Date  . ABDOMINAL HYSTERECTOMY    . ANTERIOR CERVICAL DECOMP/DISCECTOMY FUSION  04/27/2011   Procedure: ANTERIOR CERVICAL DECOMPRESSION/DISCECTOMY FUSION 2 LEVELS;  Surgeon: 06/27/2011, MD;  Location: [facility];  Service: Neurosurgery;  Laterality:  N/A;  Anterior Cervical Decompression/ Discectomy Fusion Cervical Three-Four, Cervical Four-Five  . APPENDECTOMY    . KNEE ARTHROPLASTY     right  . PERIPHERAL VASCULAR CATHETERIZATION Left 10/29/2014   Procedure: Lower Extremity Angiography;  Surgeon: Annice Needy, MD;  Location: [facility];  Service: Cardiovascular;  Laterality: Left;  . PERIPHERAL VASCULAR CATHETERIZATION Left 10/29/2014   Procedure: Lower Extremity Intervention;  Surgeon: Annice Needy, MD;  Location: [facility];  Service: Cardiovascular;  Laterality: Left;  . PERIPHERAL VASCULAR CATHETERIZATION Left 03/11/2015   Procedure: Lower Extremity Angiography;  Surgeon: Annice Needy, MD;  Location: [facility];  Service: Cardiovascular;  Laterality: Left;  . PERIPHERAL VASCULAR CATHETERIZATION Left 03/11/2015   Procedure: Lower Extremity Intervention;  Surgeon: Annice Needy, MD;  Location: [facility];  Service: Cardiovascular;  Laterality: Left;    Family History  Problem  Relation Age of Onset  . Cancer Mother   . Hypertension Mother   . Diabetes Paternal Grandmother     Allergies  Allergen Reactions  . Penicillins Shortness Of Breath and Swelling  . Cheese Itching  . Codeine Other (See Comments)    Ringing in ear TOLERATES Tramadol  . Strawberry (Diagnostic) Hives    CBC Latest Ref Rng & Units 06/09/2016 08/10/2013 04/22/2011  WBC 3.6 - 11.0 K/uL 2.3(L) 4.0 8.2  Hemoglobin 12.0 - 16.0 g/dL 11.8(L) 11.5(L) 12.4  Hematocrit 35.0 - 47.0 % 35.9 36.1 37.0  Platelets 150 - 440 K/uL 151 196 228      CMP     Component Value Date/Time   NA 139 06/09/2016 1832   NA 139 08/10/2013 1315   K 3.4 (L) 06/09/2016 1832   K 3.7 08/10/2013 1315   CL 100 (L) 06/09/2016 1832   CL 105 08/10/2013 1315   CO2 29 06/09/2016 1832   CO2 28 08/10/2013 1315   GLUCOSE 295 (H) 06/09/2016 1832   GLUCOSE 310 (H) 08/10/2013 1315   BUN 27 (H) 06/09/2016 1832   BUN 15 08/10/2013 1315   CREATININE 1.65 (H) 06/09/2016 1832   CREATININE 1.15 08/10/2013 1315   CALCIUM 8.3 (L) 06/09/2016 1832   CALCIUM 8.8 08/10/2013 1315   PROT 6.8 06/09/2016 1832   ALBUMIN 3.4 (L) 06/09/2016 1832   AST 41 06/09/2016 1832   ALT 28 06/09/2016 1832   ALKPHOS 73 06/09/2016 1832   BILITOT 0.4 06/09/2016 1832   GFRNONAA 30 (L) 06/09/2016 1832   GFRNONAA 49 (L) 08/10/2013 1315   GFRAA 35 (L) 06/09/2016 1832   GFRAA 56 (L) 08/10/2013 1315     No results found.     Assessment & Plan:   1. Lower limb ulcer, calf, right, limited to breakdown of skin (HCC) Patient's ulceration is mostly healed at this time.  However the patient does not have compression at home.  Typically when patient goes without compression it is noted that she begins to have blisters and swells again very quickly.  Therefore we will have the patient return to Unna wraps so that we can ensure no worsening swelling or blisters.  She will return in 4 weeks for reevaluation at which time we hope we are able to remove her from  wraps.  2. Type 2 diabetes mellitus with other skin ulcer, unspecified whether long term insulin use (HCC) Continue hypoglycemic medications as already ordered, these medications have been reviewed and there are no changes at this time.  Hgb A1C to be monitored as already arranged  by primary service   3. Essential hypertension, benign Continue antihypertensive medications as already ordered, these medications have been reviewed and there are no changes at this time.    Current Outpatient Medications on File Prior to Visit  Medication Sig Dispense Refill  . aspirin EC 81 MG tablet Take 81 mg by mouth daily.    . celecoxib (CELEBREX) 200 MG capsule Take 200 mg by mouth daily.    . clopidogrel (PLAVIX) 75 MG tablet Take 1 tablet (75 mg total) by mouth daily. 30 tablet 11  . furosemide (LASIX) 20 MG tablet Take 20 mg by mouth daily.    Marland Kitchen glipiZIDE (GLUCOTROL XL) 5 MG 24 hr tablet Take 5 mg by mouth 2 (two) times daily.    Marland Kitchen liraglutide (VICTOZA) 18 MG/3ML SOPN Inject into the skin.    Marland Kitchen lisinopril-hydrochlorothiazide (PRINZIDE,ZESTORETIC) 20-25 MG per tablet Take 1 tablet by mouth daily.    . metFORMIN (GLUCOPHAGE) 1000 MG tablet Take 1,000 mg by mouth 2 (two) times daily with a meal.    . methocarbamol (ROBAXIN) 500 MG tablet Take 1 tablet (500 mg total) by mouth 4 (four) times daily. 20 tablet 0  . metoprolol succinate (TOPROL-XL) 25 MG 24 hr tablet Take 1 tablet by mouth daily.    . mupirocin ointment (BACTROBAN) 2 % Place 1 application on wound daily 22 g 0  . ONETOUCH VERIO test strip 1 each 2 (two) times daily.    Marland Kitchen OZEMPIC, 0.25 OR 0.5 MG/DOSE, 2 MG/1.5ML SOPN Inject into the skin.    . pregabalin (LYRICA) 25 MG capsule Take 50 mg by mouth 3 (three) times daily.     . rosuvastatin (CRESTOR) 5 MG tablet Take 5 mg by mouth daily.    Marland Kitchen sulfamethoxazole-trimethoprim (BACTRIM DS) 800-160 MG tablet Take 1 tablet by mouth 2 (two) times daily. 14 tablet 0  . traMADol (ULTRAM) 50 MG tablet  Take 1 tablet (50 mg total) by mouth every 6 (six) hours as needed. 20 tablet 0   No current facility-administered medications on file prior to visit.    There are no Patient Instructions on file for this visit. No follow-ups on file.   Georgiana Spinner, NP

## 2020-07-12 ENCOUNTER — Encounter (INDEPENDENT_AMBULATORY_CARE_PROVIDER_SITE_OTHER): Payer: Self-pay

## 2020-07-12 ENCOUNTER — Ambulatory Visit (INDEPENDENT_AMBULATORY_CARE_PROVIDER_SITE_OTHER): Payer: Medicare HMO | Admitting: Nurse Practitioner

## 2020-07-12 ENCOUNTER — Other Ambulatory Visit: Payer: Self-pay

## 2020-07-12 VITALS — BP 158/69 | HR 78 | Resp 16

## 2020-07-12 DIAGNOSIS — L97211 Non-pressure chronic ulcer of right calf limited to breakdown of skin: Secondary | ICD-10-CM

## 2020-07-12 NOTE — Progress Notes (Signed)
History of Present Illness  There is no documented history at this time  Assessments & Plan   There are no diagnoses linked to this encounter.    Additional instructions  Subjective:  Patient presents with venous ulcer of the Bilateral lower extremity.    Procedure:  3 layer unna wrap was placed Bilateral lower extremity.   Plan:   Follow up in one week.  

## 2020-07-19 ENCOUNTER — Ambulatory Visit (INDEPENDENT_AMBULATORY_CARE_PROVIDER_SITE_OTHER): Payer: Medicare HMO | Admitting: Nurse Practitioner

## 2020-07-19 ENCOUNTER — Other Ambulatory Visit: Payer: Self-pay

## 2020-07-19 ENCOUNTER — Encounter (INDEPENDENT_AMBULATORY_CARE_PROVIDER_SITE_OTHER): Payer: Self-pay

## 2020-07-19 VITALS — BP 162/80 | HR 74 | Resp 16

## 2020-07-19 DIAGNOSIS — L97211 Non-pressure chronic ulcer of right calf limited to breakdown of skin: Secondary | ICD-10-CM

## 2020-07-19 NOTE — Progress Notes (Signed)
History of Present Illness  There is no documented history at this time  Assessments & Plan   There are no diagnoses linked to this encounter.    Additional instructions  Subjective:  Patient presents with venous ulcer of the Bilateral lower extremity.    Procedure:  3 layer unna wrap was placed Bilateral lower extremity.   Plan:   Follow up in one week.  

## 2020-07-22 ENCOUNTER — Encounter (INDEPENDENT_AMBULATORY_CARE_PROVIDER_SITE_OTHER): Payer: Self-pay | Admitting: Nurse Practitioner

## 2020-07-26 ENCOUNTER — Encounter (INDEPENDENT_AMBULATORY_CARE_PROVIDER_SITE_OTHER): Payer: Self-pay

## 2020-07-26 ENCOUNTER — Ambulatory Visit (INDEPENDENT_AMBULATORY_CARE_PROVIDER_SITE_OTHER): Payer: Medicare HMO | Admitting: Nurse Practitioner

## 2020-07-26 ENCOUNTER — Other Ambulatory Visit: Payer: Self-pay

## 2020-07-26 VITALS — BP 133/81 | HR 74 | Resp 16

## 2020-07-26 DIAGNOSIS — L97211 Non-pressure chronic ulcer of right calf limited to breakdown of skin: Secondary | ICD-10-CM

## 2020-07-26 NOTE — Progress Notes (Signed)
History of Present Illness  There is no documented history at this time  Assessments & Plan   There are no diagnoses linked to this encounter.    Additional instructions  Subjective:  Patient presents with venous ulcer of the Bilateral lower extremity.    Procedure:  3 layer unna wrap was placed Bilateral lower extremity.   Plan:   Follow up in one week.  

## 2020-07-27 ENCOUNTER — Encounter (INDEPENDENT_AMBULATORY_CARE_PROVIDER_SITE_OTHER): Payer: Self-pay | Admitting: Nurse Practitioner

## 2020-08-02 ENCOUNTER — Ambulatory Visit (INDEPENDENT_AMBULATORY_CARE_PROVIDER_SITE_OTHER): Payer: Medicare HMO | Admitting: Nurse Practitioner

## 2020-08-02 ENCOUNTER — Encounter (INDEPENDENT_AMBULATORY_CARE_PROVIDER_SITE_OTHER): Payer: Self-pay | Admitting: Nurse Practitioner

## 2020-08-02 ENCOUNTER — Other Ambulatory Visit: Payer: Self-pay

## 2020-08-02 VITALS — BP 123/69 | HR 80 | Resp 16

## 2020-08-02 DIAGNOSIS — L98499 Non-pressure chronic ulcer of skin of other sites with unspecified severity: Secondary | ICD-10-CM

## 2020-08-02 DIAGNOSIS — I10 Essential (primary) hypertension: Secondary | ICD-10-CM | POA: Diagnosis not present

## 2020-08-02 DIAGNOSIS — I890 Lymphedema, not elsewhere classified: Secondary | ICD-10-CM

## 2020-08-02 DIAGNOSIS — E11622 Type 2 diabetes mellitus with other skin ulcer: Secondary | ICD-10-CM | POA: Diagnosis not present

## 2020-08-02 DIAGNOSIS — I7025 Atherosclerosis of native arteries of other extremities with ulceration: Secondary | ICD-10-CM

## 2020-08-02 DIAGNOSIS — L97211 Non-pressure chronic ulcer of right calf limited to breakdown of skin: Secondary | ICD-10-CM

## 2020-08-02 NOTE — Progress Notes (Signed)
Subjective:    Patient ID: [name], female    DOB: [birth year], 75 y.o.   MRN: [id number] Chief Complaint  Patient presents with  . Follow-up    Unna wrap follow up    [name] is a 75 year old female that presents today for follow-up regards to lower extremity edema and ulceration.  Today the patient's ulceration has healed.  The swelling is essentially resolved.  The patient has obtain [place] wraps for transition.  The patient also elevates her lower extremities is much as possible.  The patient has been doing conservative measures consistently for months however she still continues to have issues with swelling.  She denies any fevers or chills.    Review of Systems  Cardiovascular:  Positive for leg swelling.  All other systems reviewed and are negative.     Objective:   Physical Exam Vitals reviewed.  Constitutional:      Appearance: She is obese.  HENT:     Head: Normocephalic.  Cardiovascular:     Rate and Rhythm: Normal rate.     Pulses: Normal pulses.  Pulmonary:     Effort: Pulmonary effort is normal.  Skin:    General: Skin is warm and dry.  Neurological:     Mental Status: She is alert and oriented to person, place, and time.  Psychiatric:        Mood and Affect: Mood normal.        Behavior: Behavior normal.        Thought Content: Thought content normal.        Judgment: Judgment normal.    BP 123/69 (BP Location: Left Arm)   Pulse 80   Resp 16   Past Medical History:  Diagnosis Date  . Anemia    as young child  . Arthritis    back, leg and foot  . Blood transfusion    1964  . Carpal tunnel syndrome   . Diabetes mellitus    oral medications  . Hypertension     Social History   Socioeconomic History  . Marital status: Married    Spouse name: Not on file  . Number of children: Not on file  . Years of education: Not on file  . Highest education level: Not on file  Occupational History  . Not on file  Tobacco Use  . Smoking  status: Never  . Smokeless tobacco: Never  Substance and Sexual Activity  . Alcohol use: No  . Drug use: No  . Sexual activity: Not on file  Other Topics Concern  . Not on file  Social History Narrative  . Not on file   Social Determinants of Health   Financial Resource Strain: Not on file  Food Insecurity: Not on file  Transportation Needs: Not on file  Physical Activity: Not on file  Stress: Not on file  Social Connections: Not on file  Intimate Partner Violence: Not on file    Past Surgical History:  Procedure Laterality Date  . ABDOMINAL HYSTERECTOMY    . ANTERIOR CERVICAL DECOMP/DISCECTOMY FUSION  04/27/2011   Procedure: ANTERIOR CERVICAL DECOMPRESSION/DISCECTOMY FUSION 2 LEVELS;  Surgeon: Mariam Dollar, MD;  Location: [facility];  Service: Neurosurgery;  Laterality: N/A;  Anterior Cervical Decompression/ Discectomy Fusion Cervical Three-Four, Cervical Four-Five  . APPENDECTOMY    . KNEE ARTHROPLASTY     right  . PERIPHERAL VASCULAR CATHETERIZATION Left 10/29/2014   Procedure: Lower Extremity Angiography;  Surgeon: Annice Needy, MD;  [facility]: [facility]  INVASIVE CV LAB;  Service: Cardiovascular;  Laterality: Left;  . PERIPHERAL VASCULAR CATHETERIZATION Left 10/29/2014   Procedure: Lower Extremity Intervention;  Surgeon: Annice Needy, MD;  Location: [facility];  Service: Cardiovascular;  Laterality: Left;  . PERIPHERAL VASCULAR CATHETERIZATION Left 03/11/2015   Procedure: Lower Extremity Angiography;  Surgeon: Annice Needy, MD;  Location: [facility];  Service: Cardiovascular;  Laterality: Left;  . PERIPHERAL VASCULAR CATHETERIZATION Left 03/11/2015   Procedure: Lower Extremity Intervention;  Surgeon: Annice Needy, MD;  Location: [facility];  Service: Cardiovascular;  Laterality: Left;    Family History  Problem Relation Age of Onset  . Cancer Mother   . Hypertension Mother   . Diabetes Paternal Grandmother     Allergies  Allergen Reactions  .  Penicillins Shortness Of Breath and Swelling  . Cheese Itching  . Codeine Other (See Comments)    Ringing in ear TOLERATES Tramadol  . Strawberry (Diagnostic) Hives    CBC Latest Ref Rng & Units 06/09/2016 08/10/2013 04/22/2011  WBC 3.6 - 11.0 K/uL 2.3(L) 4.0 8.2  Hemoglobin 12.0 - 16.0 g/dL 11.8(L) 11.5(L) 12.4  Hematocrit 35.0 - 47.0 % 35.9 36.1 37.0  Platelets 150 - 440 K/uL 151 196 228      CMP     Component Value Date/Time   NA 139 06/09/2016 1832   NA 139 08/10/2013 1315   K 3.4 (L) 06/09/2016 1832   K 3.7 08/10/2013 1315   CL 100 (L) 06/09/2016 1832   CL 105 08/10/2013 1315   CO2 29 06/09/2016 1832   CO2 28 08/10/2013 1315   GLUCOSE 295 (H) 06/09/2016 1832   GLUCOSE 310 (H) 08/10/2013 1315   BUN 27 (H) 06/09/2016 1832   BUN 15 08/10/2013 1315   CREATININE 1.65 (H) 06/09/2016 1832   CREATININE 1.15 08/10/2013 1315   CALCIUM 8.3 (L) 06/09/2016 1832   CALCIUM 8.8 08/10/2013 1315   PROT 6.8 06/09/2016 1832   ALBUMIN 3.4 (L) 06/09/2016 1832   AST 41 06/09/2016 1832   ALT 28 06/09/2016 1832   ALKPHOS 73 06/09/2016 1832   BILITOT 0.4 06/09/2016 1832   GFRNONAA 30 (L) 06/09/2016 1832   GFRNONAA 49 (L) 08/10/2013 1315   GFRAA 35 (L) 06/09/2016 1832   GFRAA 56 (L) 08/10/2013 1315     No results found.     Assessment & Plan:   1. Lower limb ulcer, calf, right, limited to breakdown of skin [facility]) This is currently resolved.  Patient will continue with conservative therapy as outlined below.  2. Type 2 diabetes mellitus with other skin ulcer, unspecified whether long term insulin use (HCC) Continue hypoglycemic medications as already ordered, these medications have been reviewed and there are no changes at this time.  Hgb A1C to be monitored as already arranged by primary service   3. Essential hypertension, benign Continue antihypertensive medications as already ordered, these medications have been reviewed and there are no changes at this time.   4.  Lymphedema Recommend:  No surgery or intervention at this point in time.    I have reviewed my previous discussion with the patient regarding swelling and why it causes symptoms.  Patient will continue wearing graduated compression stockings class 1 (20-30 mmHg) on a daily basis. The patient will  beginning wearing the stockings first thing in the morning and removing them in the evening. The patient is instructed specifically not to sleep in the stockings.    In addition, behavioral modification  including several periods of elevation of the lower extremities during the day will be continued.  This was reviewed with the patient during the initial visit.  The patient will also continue routine exercise, especially walking on a daily basis as was discussed during the initial visit.    Despite conservative treatments of 4 weeks including graduated compression therapy class 1 and behavioral modification including exercise and elevation the patient  has not obtained adequate control of the lymphedema.  The patient still has stage 3 lymphedema and therefore, I believe that a lymph pump should be added to improve the control of the patient's lymphedema.  Additionally, a lymph pump is warranted because it will reduce the risk of cellulitis and ulceration in the future.  Patient should follow-up in 3 months   5. Atherosclerosis of native arteries of the extremities with ulceration (HCC) The patient does have known PAD.  It has been a little over a year since we checked her ABIs.  We will check her ABIs when she returns in 3 months.  Current Outpatient Medications on File Prior to Visit  Medication Sig Dispense Refill  . aspirin EC 81 MG tablet Take 81 mg by mouth daily.    . celecoxib (CELEBREX) 200 MG capsule Take 200 mg by mouth daily.    . clopidogrel (PLAVIX) 75 MG tablet Take 1 tablet (75 mg total) by mouth daily. 30 tablet 11  . furosemide (LASIX) 20 MG tablet Take 20 mg by mouth daily.    Marland Kitchen  glipiZIDE (GLUCOTROL XL) 5 MG 24 hr tablet Take 5 mg by mouth 2 (two) times daily.    Marland Kitchen liraglutide (VICTOZA) 18 MG/3ML SOPN Inject into the skin.    Marland Kitchen lisinopril-hydrochlorothiazide (PRINZIDE,ZESTORETIC) 20-25 MG per tablet Take 1 tablet by mouth daily.    . metFORMIN (GLUCOPHAGE) 1000 MG tablet Take 1,000 mg by mouth 2 (two) times daily with a meal.    . methocarbamol (ROBAXIN) 500 MG tablet Take 1 tablet (500 mg total) by mouth 4 (four) times daily. 20 tablet 0  . metoprolol succinate (TOPROL-XL) 25 MG 24 hr tablet Take 1 tablet by mouth daily.    . mupirocin ointment (BACTROBAN) 2 % Place 1 application on wound daily 22 g 0  . ONETOUCH VERIO test strip 1 each 2 (two) times daily.    Marland Kitchen OZEMPIC, 0.25 OR 0.5 MG/DOSE, 2 MG/1.5ML SOPN Inject into the skin.    . pregabalin (LYRICA) 25 MG capsule Take 50 mg by mouth 3 (three) times daily.     . rosuvastatin (CRESTOR) 5 MG tablet Take 5 mg by mouth daily.    Marland Kitchen sulfamethoxazole-trimethoprim (BACTRIM DS) 800-160 MG tablet Take 1 tablet by mouth 2 (two) times daily. (Patient not taking: No sig reported) 14 tablet 0  . traMADol (ULTRAM) 50 MG tablet Take 1 tablet (50 mg total) by mouth every 6 (six) hours as needed. (Patient not taking: Reported on 08/02/2020) 20 tablet 0   No current facility-administered medications on file prior to visit.    There are no Patient Instructions on file for this visit. No follow-ups on file.   Georgiana Spinner, NP

## 2020-09-20 ENCOUNTER — Ambulatory Visit (INDEPENDENT_AMBULATORY_CARE_PROVIDER_SITE_OTHER): Payer: Medicare HMO | Admitting: Nurse Practitioner

## 2020-09-20 ENCOUNTER — Other Ambulatory Visit: Payer: Self-pay

## 2020-09-20 VITALS — BP 135/77 | HR 85 | Resp 16

## 2020-09-20 DIAGNOSIS — L97211 Non-pressure chronic ulcer of right calf limited to breakdown of skin: Secondary | ICD-10-CM

## 2020-09-20 NOTE — Progress Notes (Signed)
History of Present Illness  There is no documented history at this time  Assessments & Plan   There are no diagnoses linked to this encounter.    Additional instructions  Subjective:  Patient presents with venous ulcer of the Bilateral lower extremity.    Procedure:  3 layer unna wrap was placed Bilateral lower extremity.   Plan:   Follow up in one week.  

## 2020-09-27 ENCOUNTER — Other Ambulatory Visit: Payer: Self-pay

## 2020-09-27 ENCOUNTER — Encounter (INDEPENDENT_AMBULATORY_CARE_PROVIDER_SITE_OTHER): Payer: Self-pay

## 2020-09-27 ENCOUNTER — Ambulatory Visit (INDEPENDENT_AMBULATORY_CARE_PROVIDER_SITE_OTHER): Payer: Medicare HMO | Admitting: Nurse Practitioner

## 2020-09-27 VITALS — BP 156/77 | HR 68 | Resp 16

## 2020-09-27 DIAGNOSIS — L97211 Non-pressure chronic ulcer of right calf limited to breakdown of skin: Secondary | ICD-10-CM

## 2020-09-27 NOTE — Progress Notes (Signed)
History of Present Illness  There is no documented history at this time  Assessments & Plan   There are no diagnoses linked to this encounter.    Additional instructions  Subjective:  Patient presents with venous ulcer of the Bilateral lower extremity.    Procedure:  3 layer unna wrap was placed Bilateral lower extremity.   Plan:   Follow up in one week.  

## 2020-09-28 ENCOUNTER — Encounter (INDEPENDENT_AMBULATORY_CARE_PROVIDER_SITE_OTHER): Payer: Self-pay | Admitting: Nurse Practitioner

## 2020-09-29 ENCOUNTER — Encounter (INDEPENDENT_AMBULATORY_CARE_PROVIDER_SITE_OTHER): Payer: Self-pay | Admitting: Nurse Practitioner

## 2020-10-04 ENCOUNTER — Other Ambulatory Visit: Payer: Self-pay

## 2020-10-04 ENCOUNTER — Encounter (INDEPENDENT_AMBULATORY_CARE_PROVIDER_SITE_OTHER): Payer: Self-pay

## 2020-10-04 ENCOUNTER — Ambulatory Visit (INDEPENDENT_AMBULATORY_CARE_PROVIDER_SITE_OTHER): Payer: Medicare HMO | Admitting: Nurse Practitioner

## 2020-10-04 VITALS — BP 146/78 | HR 68 | Resp 16

## 2020-10-04 DIAGNOSIS — L97211 Non-pressure chronic ulcer of right calf limited to breakdown of skin: Secondary | ICD-10-CM | POA: Diagnosis not present

## 2020-10-04 MED ORDER — SULFAMETHOXAZOLE-TRIMETHOPRIM 800-160 MG PO TABS: 1.0000 | ORAL_TABLET | Freq: Two times a day (BID) | ORAL | 0 refills | Status: DC

## 2020-10-04 NOTE — Progress Notes (Signed)
History of Present Illness  There is no documented history at this time  Assessments & Plan   There are no diagnoses linked to this encounter.    Additional instructions  Subjective:  Patient presents with venous ulcer of the Bilateral lower extremity.    Procedure:  3 layer unna wrap was placed Bilateral lower extremity.   Plan:   Follow up in one week.  

## 2020-10-11 ENCOUNTER — Ambulatory Visit (INDEPENDENT_AMBULATORY_CARE_PROVIDER_SITE_OTHER): Payer: Medicare HMO | Admitting: Nurse Practitioner

## 2020-10-11 ENCOUNTER — Other Ambulatory Visit: Payer: Self-pay

## 2020-10-11 VITALS — BP 150/71 | HR 70 | Ht 67.0 in | Wt 295.0 lb

## 2020-10-11 DIAGNOSIS — E11622 Type 2 diabetes mellitus with other skin ulcer: Secondary | ICD-10-CM

## 2020-10-11 DIAGNOSIS — I10 Essential (primary) hypertension: Secondary | ICD-10-CM | POA: Diagnosis not present

## 2020-10-11 DIAGNOSIS — L98499 Non-pressure chronic ulcer of skin of other sites with unspecified severity: Secondary | ICD-10-CM | POA: Diagnosis not present

## 2020-10-11 DIAGNOSIS — L97321 Non-pressure chronic ulcer of left ankle limited to breakdown of skin: Secondary | ICD-10-CM | POA: Diagnosis not present

## 2020-10-12 ENCOUNTER — Encounter (INDEPENDENT_AMBULATORY_CARE_PROVIDER_SITE_OTHER): Payer: Self-pay | Admitting: Nurse Practitioner

## 2020-10-12 NOTE — Progress Notes (Signed)
Subjective:    Patient ID: [name], female    DOB: 04/19/1945, 75 y.o.   MRN: [id number] Chief Complaint  Patient presents with   Follow-up    4wk  Bil una boot check    [name] is a 75 year old female that presents to the office today for evaluation of a left lower extremity wound.  Today the wound has no evidence of infection.  The patient has been tolerating the Unna wraps well.  She has been in wraps for several weeks at this point.  The wound is very shallow and has good granulation tissue.  She denies any fevers or chills.  The patient's only complaint is that after several days the wraps feel somewhat dry.  She has also been elevating her lower extremities is much as possible.   Review of Systems  Skin:  Positive for wound.  All other systems reviewed and are negative.     Objective:   Physical Exam Vitals reviewed.  HENT:     Head: Normocephalic.  Cardiovascular:     Rate and Rhythm: Normal rate.     Pulses: Normal pulses.  Pulmonary:     Effort: Pulmonary effort is normal.  Musculoskeletal:     Right lower leg: Edema present.     Left lower leg: Edema present.  Skin:    General: Skin is warm and dry.     Findings: Wound present.  Neurological:     Mental Status: She is alert and oriented to person, place, and time.  Psychiatric:        Mood and Affect: Mood normal.        Behavior: Behavior normal.        Thought Content: Thought content normal.        Judgment: Judgment normal.    BP (!) 150/71   Pulse 70   Ht 5\' 7"  (1.702 m)   Wt 295 lb (133.8 kg)   BMI 46.20 kg/m   Past Medical History:  Diagnosis Date   Anemia    as young child   Arthritis    back, leg and foot   Blood transfusion    1964   Carpal tunnel syndrome    Diabetes mellitus    oral medications   Hypertension     Social History   Socioeconomic History   Marital status: Married    Spouse name: Not on file   Number of children: Not on file   Years of education: Not on  file   Highest education level: Not on file  Occupational History   Not on file  Tobacco Use   Smoking status: Never   Smokeless tobacco: Never  Substance and Sexual Activity   Alcohol use: No   Drug use: No   Sexual activity: Not on file  Other Topics Concern   Not on file  Social History Narrative   Not on file   Social Determinants of Health   Financial Resource Strain: Not on file  Food Insecurity: Not on file  Transportation Needs: Not on file  Physical Activity: Not on file  Stress: Not on file  Social Connections: Not on file  Intimate Partner Violence: Not on file    Past Surgical History:  Procedure Laterality Date   ABDOMINAL HYSTERECTOMY     ANTERIOR CERVICAL DECOMP/DISCECTOMY FUSION  04/27/2011   Procedure: ANTERIOR CERVICAL DECOMPRESSION/DISCECTOMY FUSION 2 LEVELS;  Surgeon: 06/27/2011, MD;  Location: [facility];  Service: Neurosurgery;  Laterality: N/A;  Anterior Cervical Decompression/ Discectomy Fusion Cervical Three-Four, Cervical Four-Five   APPENDECTOMY     KNEE ARTHROPLASTY     right   PERIPHERAL VASCULAR CATHETERIZATION Left 10/29/2014   Procedure: Lower Extremity Angiography;  Surgeon: Annice Needy, MD;  Location: [facility];  Service: Cardiovascular;  Laterality: Left;   PERIPHERAL VASCULAR CATHETERIZATION Left 10/29/2014   Procedure: Lower Extremity Intervention;  Surgeon: Annice Needy, MD;  Location: [facility];  Service: Cardiovascular;  Laterality: Left;   PERIPHERAL VASCULAR CATHETERIZATION Left 03/11/2015   Procedure: Lower Extremity Angiography;  Surgeon: Annice Needy, MD;  Location: [facility];  Service: Cardiovascular;  Laterality: Left;   PERIPHERAL VASCULAR CATHETERIZATION Left 03/11/2015   Procedure: Lower Extremity Intervention;  Surgeon: Annice Needy, MD;  Location: [facility];  Service: Cardiovascular;  Laterality: Left;    Family History  Problem Relation Age of Onset   Cancer Mother     Hypertension Mother    Diabetes Paternal Grandmother     Allergies  Allergen Reactions   Penicillins Shortness Of Breath and Swelling   Cheese Itching   Codeine Other (See Comments)    Ringing in ear TOLERATES Tramadol Other reaction(s): Other (See Comments) RINGING IN THE EARS Ringing in ear TOLERATES Tramadol   Strawberry (Diagnostic) Hives   Strawberry Extract Hives    CBC Latest Ref Rng & Units 06/09/2016 08/10/2013 04/22/2011  WBC 3.6 - 11.0 K/uL 2.3(L) 4.0 8.2  Hemoglobin 12.0 - 16.0 g/dL 11.8(L) 11.5(L) 12.4  Hematocrit 35.0 - 47.0 % 35.9 36.1 37.0  Platelets 150 - 440 K/uL 151 196 228      CMP     Component Value Date/Time   NA 139 06/09/2016 1832   NA 139 08/10/2013 1315   K 3.4 (L) 06/09/2016 1832   K 3.7 08/10/2013 1315   CL 100 (L) 06/09/2016 1832   CL 105 08/10/2013 1315   CO2 29 06/09/2016 1832   CO2 28 08/10/2013 1315   GLUCOSE 295 (H) 06/09/2016 1832   GLUCOSE 310 (H) 08/10/2013 1315   BUN 27 (H) 06/09/2016 1832   BUN 15 08/10/2013 1315   CREATININE 1.65 (H) 06/09/2016 1832   CREATININE 1.15 08/10/2013 1315   CALCIUM 8.3 (L) 06/09/2016 1832   CALCIUM 8.8 08/10/2013 1315   PROT 6.8 06/09/2016 1832   ALBUMIN 3.4 (L) 06/09/2016 1832   AST 41 06/09/2016 1832   ALT 28 06/09/2016 1832   ALKPHOS 73 06/09/2016 1832   BILITOT 0.4 06/09/2016 1832   GFRNONAA 30 (L) 06/09/2016 1832   GFRNONAA 49 (L) 08/10/2013 1315   GFRAA 35 (L) 06/09/2016 1832   GFRAA 56 (L) 08/10/2013 1315     No results found.     Assessment & Plan:   1. Lower limb ulcer, ankle, left, limited to breakdown of skin (HCC) The patient's wounds are improving however they are still not quite healed.  We will have the patient remain in Unna wraps to be changed on a weekly basis pending drainage.  The patient return to the office in 4 weeks to evaluate progress.  2. Essential hypertension, benign Continue antihypertensive medications as already ordered, these medications have been  reviewed and there are no changes at this time.   3. Type 2 diabetes mellitus with other skin ulcer, unspecified whether long term insulin use (HCC) Continue hypoglycemic medications as already ordered, these medications have been reviewed and there are no changes at this time.  Hgb A1C to be  monitored as already arranged by primary service    Current Outpatient Medications on File Prior to Visit  Medication Sig Dispense Refill   aspirin EC 81 MG tablet Take 81 mg by mouth daily.     celecoxib (CELEBREX) 200 MG capsule Take 200 mg by mouth daily.     clopidogrel (PLAVIX) 75 MG tablet Take 1 tablet (75 mg total) by mouth daily. 30 tablet 11   furosemide (LASIX) 20 MG tablet Take 20 mg by mouth daily.     glipiZIDE (GLUCOTROL XL) 5 MG 24 hr tablet Take 5 mg by mouth 2 (two) times daily.     liraglutide (VICTOZA) 18 MG/3ML SOPN Inject into the skin.     lisinopril (ZESTRIL) 10 MG tablet Take 10 mg by mouth daily.     lisinopril-hydrochlorothiazide (PRINZIDE,ZESTORETIC) 20-25 MG per tablet Take 1 tablet by mouth daily.     metFORMIN (GLUCOPHAGE) 1000 MG tablet Take 1,000 mg by mouth 2 (two) times daily with a meal.     methocarbamol (ROBAXIN) 500 MG tablet Take 1 tablet (500 mg total) by mouth 4 (four) times daily. 20 tablet 0   metoprolol succinate (TOPROL-XL) 25 MG 24 hr tablet Take 1 tablet by mouth daily.     mupirocin ointment (BACTROBAN) 2 % Place 1 application on wound daily 22 g 0   ONETOUCH VERIO test strip 1 each 2 (two) times daily.     OZEMPIC, 0.25 OR 0.5 MG/DOSE, 2 MG/1.5ML SOPN Inject into the skin.     pregabalin (LYRICA) 25 MG capsule Take 50 mg by mouth 3 (three) times daily.      rosuvastatin (CRESTOR) 5 MG tablet Take 5 mg by mouth daily.     sulfamethoxazole-trimethoprim (BACTRIM DS) 800-160 MG tablet Take 1 tablet by mouth 2 (two) times daily. 20 tablet 0   traMADol (ULTRAM) 50 MG tablet Take 1 tablet (50 mg total) by mouth every 6 (six) hours as needed. 20 tablet 0    No current facility-administered medications on file prior to visit.    There are no Patient Instructions on file for this visit. No follow-ups on file.   Georgiana Spinner, NP

## 2020-10-18 ENCOUNTER — Other Ambulatory Visit: Payer: Self-pay

## 2020-10-18 ENCOUNTER — Ambulatory Visit (INDEPENDENT_AMBULATORY_CARE_PROVIDER_SITE_OTHER): Payer: Medicare HMO | Admitting: Nurse Practitioner

## 2020-10-18 ENCOUNTER — Encounter (INDEPENDENT_AMBULATORY_CARE_PROVIDER_SITE_OTHER): Payer: Self-pay

## 2020-10-18 VITALS — BP 142/63 | HR 63 | Resp 16

## 2020-10-18 DIAGNOSIS — L97321 Non-pressure chronic ulcer of left ankle limited to breakdown of skin: Secondary | ICD-10-CM

## 2020-10-18 NOTE — Progress Notes (Signed)
History of Present Illness  There is no documented history at this time  Assessments & Plan   There are no diagnoses linked to this encounter.    Additional instructions  Subjective:  Patient presents with venous ulcer of the Bilateral lower extremity.    Procedure:  3 layer unna wrap was placed Bilateral lower extremity.   Plan:   Follow up in one week.  

## 2020-10-25 ENCOUNTER — Ambulatory Visit (INDEPENDENT_AMBULATORY_CARE_PROVIDER_SITE_OTHER): Payer: Medicare HMO | Admitting: Nurse Practitioner

## 2020-10-25 ENCOUNTER — Encounter (INDEPENDENT_AMBULATORY_CARE_PROVIDER_SITE_OTHER): Payer: Self-pay

## 2020-10-25 ENCOUNTER — Other Ambulatory Visit: Payer: Self-pay

## 2020-10-25 VITALS — BP 146/79 | HR 61 | Resp 16

## 2020-10-25 DIAGNOSIS — L97321 Non-pressure chronic ulcer of left ankle limited to breakdown of skin: Secondary | ICD-10-CM | POA: Diagnosis not present

## 2020-10-25 NOTE — Progress Notes (Signed)
History of Present Illness  There is no documented history at this time  Assessments & Plan   There are no diagnoses linked to this encounter.    Additional instructions  Subjective:  Patient presents with venous ulcer of the Bilateral lower extremity.    Procedure:  3 layer unna wrap was placed Bilateral lower extremity.   Plan:   Follow up in one week.  

## 2020-11-01 ENCOUNTER — Ambulatory Visit (INDEPENDENT_AMBULATORY_CARE_PROVIDER_SITE_OTHER): Payer: Medicare HMO | Admitting: Nurse Practitioner

## 2020-11-01 ENCOUNTER — Other Ambulatory Visit: Payer: Self-pay

## 2020-11-01 ENCOUNTER — Encounter (INDEPENDENT_AMBULATORY_CARE_PROVIDER_SITE_OTHER): Payer: Medicare HMO

## 2020-11-01 ENCOUNTER — Encounter (INDEPENDENT_AMBULATORY_CARE_PROVIDER_SITE_OTHER): Payer: Self-pay

## 2020-11-01 VITALS — BP 137/78 | HR 68 | Resp 16

## 2020-11-01 DIAGNOSIS — L97321 Non-pressure chronic ulcer of left ankle limited to breakdown of skin: Secondary | ICD-10-CM | POA: Diagnosis not present

## 2020-11-01 NOTE — Progress Notes (Signed)
History of Present Illness  There is no documented history at this time  Assessments & Plan   There are no diagnoses linked to this encounter.    Additional instructions  Subjective:  Patient presents with venous ulcer of the Bilateral lower extremity.    Procedure:  3 layer unna wrap was placed Bilateral lower extremity.   Plan:   Follow up in one week.  

## 2020-11-04 ENCOUNTER — Encounter (INDEPENDENT_AMBULATORY_CARE_PROVIDER_SITE_OTHER): Payer: Self-pay | Admitting: Nurse Practitioner

## 2020-11-08 ENCOUNTER — Other Ambulatory Visit: Payer: Self-pay

## 2020-11-08 ENCOUNTER — Encounter (INDEPENDENT_AMBULATORY_CARE_PROVIDER_SITE_OTHER): Payer: Medicare HMO

## 2020-11-08 ENCOUNTER — Ambulatory Visit (INDEPENDENT_AMBULATORY_CARE_PROVIDER_SITE_OTHER): Payer: Medicare HMO | Admitting: Nurse Practitioner

## 2020-11-08 DIAGNOSIS — L97321 Non-pressure chronic ulcer of left ankle limited to breakdown of skin: Secondary | ICD-10-CM

## 2020-11-08 NOTE — Progress Notes (Signed)
Patient came out of unna wraps today. Patient will keep upcoming appointment for 10/3020.I advise patient to wear compression stockings and elevate above heart level.

## 2020-11-11 ENCOUNTER — Encounter (INDEPENDENT_AMBULATORY_CARE_PROVIDER_SITE_OTHER): Payer: Self-pay | Admitting: Nurse Practitioner

## 2020-11-13 ENCOUNTER — Other Ambulatory Visit (INDEPENDENT_AMBULATORY_CARE_PROVIDER_SITE_OTHER): Payer: Self-pay | Admitting: Nurse Practitioner

## 2020-11-13 DIAGNOSIS — L97329 Non-pressure chronic ulcer of left ankle with unspecified severity: Secondary | ICD-10-CM

## 2020-11-15 ENCOUNTER — Encounter (INDEPENDENT_AMBULATORY_CARE_PROVIDER_SITE_OTHER): Payer: Medicare HMO

## 2020-11-15 ENCOUNTER — Ambulatory Visit (INDEPENDENT_AMBULATORY_CARE_PROVIDER_SITE_OTHER): Payer: Medicare HMO | Admitting: Nurse Practitioner

## 2020-11-22 ENCOUNTER — Ambulatory Visit (INDEPENDENT_AMBULATORY_CARE_PROVIDER_SITE_OTHER): Payer: Medicare HMO | Admitting: Nurse Practitioner

## 2020-11-22 ENCOUNTER — Encounter (INDEPENDENT_AMBULATORY_CARE_PROVIDER_SITE_OTHER): Payer: Self-pay | Admitting: Nurse Practitioner

## 2020-11-22 ENCOUNTER — Ambulatory Visit (INDEPENDENT_AMBULATORY_CARE_PROVIDER_SITE_OTHER): Payer: Medicare HMO

## 2020-11-22 ENCOUNTER — Other Ambulatory Visit: Payer: Self-pay

## 2020-11-22 VITALS — BP 150/85 | HR 91 | Resp 16

## 2020-11-22 DIAGNOSIS — L03119 Cellulitis of unspecified part of limb: Secondary | ICD-10-CM | POA: Diagnosis not present

## 2020-11-22 DIAGNOSIS — L97329 Non-pressure chronic ulcer of left ankle with unspecified severity: Secondary | ICD-10-CM | POA: Diagnosis not present

## 2020-11-22 DIAGNOSIS — I10 Essential (primary) hypertension: Secondary | ICD-10-CM

## 2020-11-23 ENCOUNTER — Encounter (INDEPENDENT_AMBULATORY_CARE_PROVIDER_SITE_OTHER): Payer: Self-pay | Admitting: Nurse Practitioner

## 2020-11-23 MED ORDER — TRAMADOL HCL 50 MG PO TABS: 50.0000 mg | ORAL_TABLET | Freq: Four times a day (QID) | ORAL | 0 refills | Status: DC | PRN

## 2020-11-23 MED ORDER — SULFAMETHOXAZOLE-TRIMETHOPRIM 800-160 MG PO TABS: 1.0000 | ORAL_TABLET | Freq: Two times a day (BID) | ORAL | 0 refills | Status: DC

## 2020-11-23 NOTE — Progress Notes (Signed)
Subjective:    Patient ID: [name], female    DOB: [birth year], 75 y.o.   MRN: [id number] Chief Complaint  Patient presents with   Follow-up    Ultrasound follow up    [name] is a 75 year old female that returns to the office for followup evaluation regarding leg swelling.  The swelling has persisted and the pain associated with swelling continues. There have been a recent development of some blisters and superficial wounds.  Since the previous visit the patient has been wearing graduated compression stockings and has noted little if any improvement in the lymphedema. The patient has been using compression routinely morning until night.  The patient also states elevation during the day and exercise is being done too.    Today noninvasive studies show an ABI of 1.18 on the right and 1.18 on the left.  The patient has normal TBI's.  She has triphasic tibial artery waveforms with good toe waveforms.   Review of Systems  Cardiovascular:  Positive for leg swelling.  Skin:  Positive for wound.  All other systems reviewed and are negative.     Objective:   Physical Exam Vitals reviewed.  Constitutional:      Appearance: She is obese.  HENT:     Head: Normocephalic.  Cardiovascular:     Rate and Rhythm: Normal rate.     Pulses: Decreased pulses.  Pulmonary:     Effort: Pulmonary effort is normal.  Skin:    General: Skin is warm and dry.     Findings: Wound present.  Neurological:     Mental Status: She is alert and oriented to person, place, and time.     Motor: Weakness present.  Psychiatric:        Mood and Affect: Mood normal.        Behavior: Behavior normal.        Thought Content: Thought content normal.        Judgment: Judgment normal.    BP (!) 150/85 (BP Location: Right Arm)   Pulse 91   Resp 16   Past Medical History:  Diagnosis Date   Anemia    as young child   Arthritis    back, leg and foot   Blood transfusion    1964   Carpal tunnel  syndrome    Diabetes mellitus    oral medications   Hypertension     Social History   Socioeconomic History   Marital status: Married    Spouse name: Not on file   Number of children: Not on file   Years of education: Not on file   Highest education level: Not on file  Occupational History   Not on file  Tobacco Use   Smoking status: Never   Smokeless tobacco: Never  Substance and Sexual Activity   Alcohol use: No   Drug use: No   Sexual activity: Not on file  Other Topics Concern   Not on file  Social History Narrative   Not on file   Social Determinants of Health   Financial Resource Strain: Not on file  Food Insecurity: Not on file  Transportation Needs: Not on file  Physical Activity: Not on file  Stress: Not on file  Social Connections: Not on file  Intimate Partner Violence: Not on file    Past Surgical History:  Procedure Laterality Date   ABDOMINAL HYSTERECTOMY     ANTERIOR CERVICAL DECOMP/DISCECTOMY FUSION  04/27/2011   Procedure: ANTERIOR CERVICAL DECOMPRESSION/DISCECTOMY FUSION  2 LEVELS;  Surgeon: Mariam Dollar, MD;  Location: [facility];  Service: Neurosurgery;  Laterality: N/A;  Anterior Cervical Decompression/ Discectomy Fusion Cervical Three-Four, Cervical Four-Five   APPENDECTOMY     KNEE ARTHROPLASTY     right   PERIPHERAL VASCULAR CATHETERIZATION Left 10/29/2014   Procedure: Lower Extremity Angiography;  Surgeon: Annice Needy, MD;  Location: [facility];  Service: Cardiovascular;  Laterality: Left;   PERIPHERAL VASCULAR CATHETERIZATION Left 10/29/2014   Procedure: Lower Extremity Intervention;  Surgeon: Annice Needy, MD;  Location: [facility];  Service: Cardiovascular;  Laterality: Left;   PERIPHERAL VASCULAR CATHETERIZATION Left 03/11/2015   Procedure: Lower Extremity Angiography;  Surgeon: Annice Needy, MD;  Location: [facility];  Service: Cardiovascular;  Laterality: Left;   PERIPHERAL VASCULAR CATHETERIZATION Left  03/11/2015   Procedure: Lower Extremity Intervention;  Surgeon: Annice Needy, MD;  Location: [facility];  Service: Cardiovascular;  Laterality: Left;    Family History  Problem Relation Age of Onset   Cancer Mother    Hypertension Mother    Diabetes Paternal Grandmother     Allergies  Allergen Reactions   Penicillins Shortness Of Breath and Swelling   Cheese Itching   Codeine Other (See Comments)    Ringing in ear TOLERATES Tramadol Other reaction(s): Other (See Comments) RINGING IN THE EARS Ringing in ear TOLERATES Tramadol   Strawberry (Diagnostic) Hives   Strawberry Extract Hives    CBC Latest Ref Rng & Units 06/09/2016 08/10/2013 04/22/2011  WBC 3.6 - 11.0 K/uL 2.3(L) 4.0 8.2  Hemoglobin 12.0 - 16.0 g/dL 11.8(L) 11.5(L) 12.4  Hematocrit 35.0 - 47.0 % 35.9 36.1 37.0  Platelets 150 - 440 K/uL 151 196 228      CMP     Component Value Date/Time   NA 139 06/09/2016 1832   NA 139 08/10/2013 1315   K 3.4 (L) 06/09/2016 1832   K 3.7 08/10/2013 1315   CL 100 (L) 06/09/2016 1832   CL 105 08/10/2013 1315   CO2 29 06/09/2016 1832   CO2 28 08/10/2013 1315   GLUCOSE 295 (H) 06/09/2016 1832   GLUCOSE 310 (H) 08/10/2013 1315   BUN 27 (H) 06/09/2016 1832   BUN 15 08/10/2013 1315   CREATININE 1.65 (H) 06/09/2016 1832   CREATININE 1.15 08/10/2013 1315   CALCIUM 8.3 (L) 06/09/2016 1832   CALCIUM 8.8 08/10/2013 1315   PROT 6.8 06/09/2016 1832   ALBUMIN 3.4 (L) 06/09/2016 1832   AST 41 06/09/2016 1832   ALT 28 06/09/2016 1832   ALKPHOS 73 06/09/2016 1832   BILITOT 0.4 06/09/2016 1832   GFRNONAA 30 (L) 06/09/2016 1832   GFRNONAA 49 (L) 08/10/2013 1315   GFRAA 35 (L) 06/09/2016 1832   GFRAA 56 (L) 08/10/2013 1315     No results found.     Assessment & Plan:   1. Cellulitis of lower extremity, unspecified laterality The patient has noted cellulitis.  Will prescribe Bactrim as well as tramadol for the pain. - traMADol (ULTRAM) 50 MG tablet; Take 1 tablet (50 mg  total) by mouth every 6 (six) hours as needed.  Dispense: 20 tablet; Refill: 0  2. Essential hypertension Continue antihypertensive medications as already ordered, these medications have been reviewed and there are no changes at this time.   3. Lower limb ulcer, ankle, left, with unspecified severity (HCC) No surgery or intervention at this point in time.    I have had a long discussion  with the patient regarding venous insufficiency and why it  causes symptoms, specifically venous ulceration . I have discussed with the patient the chronic skin changes that accompany venous insufficiency and the long term sequela such as infection and recurring  ulceration.  Patient will be placed in [employer] which will be changed weekly drainage permitting.  In addition, behavioral modification including several periods of elevation of the lower extremities during the day will be continued. Achieving a position with the ankles at heart level was stressed to the patient  The patient is instructed to begin routine exercise, especially walking on a daily basis  The patient will follow up in 4 weeks for evaluation of progress with endurance.   Current Outpatient Medications on File Prior to Visit  Medication Sig Dispense Refill   aspirin EC 81 MG tablet Take 81 mg by mouth daily.     celecoxib (CELEBREX) 200 MG capsule Take 200 mg by mouth daily.     clopidogrel (PLAVIX) 75 MG tablet Take 1 tablet (75 mg total) by mouth daily. 30 tablet 11   furosemide (LASIX) 20 MG tablet Take 20 mg by mouth daily.     glipiZIDE (GLUCOTROL XL) 5 MG 24 hr tablet Take 5 mg by mouth 2 (two) times daily.     lisinopril (ZESTRIL) 10 MG tablet Take 10 mg by mouth daily.     lisinopril (ZESTRIL) 10 MG tablet Take 10 mg by mouth daily.     metFORMIN (GLUCOPHAGE) 1000 MG tablet Take 1,000 mg by mouth 2 (two) times daily with a meal.     methocarbamol (ROBAXIN) 500 MG tablet Take 1 tablet (500 mg total) by mouth 4 (four) times  daily. 20 tablet 0   metoprolol succinate (TOPROL-XL) 25 MG 24 hr tablet Take 1 tablet by mouth daily.     mupirocin ointment (BACTROBAN) 2 % Place 1 application on wound daily 22 g 0   ONETOUCH VERIO test strip 1 each 2 (two) times daily.     OZEMPIC, 0.25 OR 0.5 MG/DOSE, 2 MG/1.5ML SOPN Inject into the skin.     pregabalin (LYRICA) 25 MG capsule Take 50 mg by mouth 3 (three) times daily.      rosuvastatin (CRESTOR) 5 MG tablet Take 5 mg by mouth daily.     liraglutide (VICTOZA) 18 MG/3ML SOPN Inject into the skin. (Patient not taking: Reported on 11/22/2020)     lisinopril-hydrochlorothiazide (PRINZIDE,ZESTORETIC) 20-25 MG per tablet Take 1 tablet by mouth daily. (Patient not taking: Reported on 11/22/2020)     No current facility-administered medications on file prior to visit.    There are no Patient Instructions on file for this visit. No follow-ups on file.   Georgiana Spinner, NP

## 2020-11-29 ENCOUNTER — Ambulatory Visit (INDEPENDENT_AMBULATORY_CARE_PROVIDER_SITE_OTHER): Payer: Medicare HMO | Admitting: Nurse Practitioner

## 2020-11-29 ENCOUNTER — Other Ambulatory Visit: Payer: Self-pay

## 2020-11-29 ENCOUNTER — Encounter (INDEPENDENT_AMBULATORY_CARE_PROVIDER_SITE_OTHER): Payer: Self-pay | Admitting: Nurse Practitioner

## 2020-11-29 VITALS — BP 145/72 | HR 74 | Resp 16

## 2020-11-29 DIAGNOSIS — L97321 Non-pressure chronic ulcer of left ankle limited to breakdown of skin: Secondary | ICD-10-CM | POA: Diagnosis not present

## 2020-11-29 DIAGNOSIS — L97329 Non-pressure chronic ulcer of left ankle with unspecified severity: Secondary | ICD-10-CM | POA: Diagnosis not present

## 2020-11-29 NOTE — Progress Notes (Signed)
History of Present Illness  There is no documented history at this time  Assessments & Plan   There are no diagnoses linked to this encounter.    Additional instructions  Subjective:  Patient presents with venous ulcer of the Bilateral lower extremity.    Procedure:  3 layer unna wrap was placed Bilateral lower extremity.   Plan:   Follow up in one week.  

## 2020-11-30 ENCOUNTER — Encounter (INDEPENDENT_AMBULATORY_CARE_PROVIDER_SITE_OTHER): Payer: Self-pay | Admitting: Nurse Practitioner

## 2020-12-04 ENCOUNTER — Other Ambulatory Visit: Payer: Self-pay | Admitting: Nephrology

## 2020-12-04 ENCOUNTER — Other Ambulatory Visit (HOSPITAL_COMMUNITY): Payer: Self-pay | Admitting: Nephrology

## 2020-12-04 DIAGNOSIS — N1832 Chronic kidney disease, stage 3b: Secondary | ICD-10-CM | POA: Insufficient documentation

## 2020-12-04 DIAGNOSIS — E7800 Pure hypercholesterolemia, unspecified: Secondary | ICD-10-CM | POA: Insufficient documentation

## 2020-12-04 DIAGNOSIS — R609 Edema, unspecified: Secondary | ICD-10-CM | POA: Insufficient documentation

## 2020-12-04 DIAGNOSIS — E663 Overweight: Secondary | ICD-10-CM | POA: Insufficient documentation

## 2020-12-06 ENCOUNTER — Encounter (INDEPENDENT_AMBULATORY_CARE_PROVIDER_SITE_OTHER): Payer: Self-pay | Admitting: Nurse Practitioner

## 2020-12-06 ENCOUNTER — Ambulatory Visit (INDEPENDENT_AMBULATORY_CARE_PROVIDER_SITE_OTHER): Payer: Medicare HMO | Admitting: Nurse Practitioner

## 2020-12-06 ENCOUNTER — Other Ambulatory Visit: Payer: Self-pay

## 2020-12-06 VITALS — BP 149/74 | HR 71 | Resp 16

## 2020-12-06 DIAGNOSIS — L97321 Non-pressure chronic ulcer of left ankle limited to breakdown of skin: Secondary | ICD-10-CM

## 2020-12-06 DIAGNOSIS — L97329 Non-pressure chronic ulcer of left ankle with unspecified severity: Secondary | ICD-10-CM | POA: Diagnosis not present

## 2020-12-06 NOTE — Progress Notes (Signed)
History of Present Illness  There is no documented history at this time  Assessments & Plan   There are no diagnoses linked to this encounter.    Additional instructions  Subjective:  Patient presents with venous ulcer of the Bilateral lower extremity.    Procedure:  3 layer unna wrap was placed Bilateral lower extremity.   Plan:   Follow up in one week.  

## 2020-12-13 ENCOUNTER — Telehealth (INDEPENDENT_AMBULATORY_CARE_PROVIDER_SITE_OTHER): Payer: Self-pay

## 2020-12-13 ENCOUNTER — Ambulatory Visit (INDEPENDENT_AMBULATORY_CARE_PROVIDER_SITE_OTHER): Payer: Medicare HMO | Admitting: Nurse Practitioner

## 2020-12-13 ENCOUNTER — Encounter (INDEPENDENT_AMBULATORY_CARE_PROVIDER_SITE_OTHER): Payer: Medicare HMO

## 2020-12-13 ENCOUNTER — Other Ambulatory Visit: Payer: Self-pay

## 2020-12-13 ENCOUNTER — Encounter (INDEPENDENT_AMBULATORY_CARE_PROVIDER_SITE_OTHER): Payer: Self-pay

## 2020-12-13 VITALS — BP 128/76 | HR 69 | Resp 16

## 2020-12-13 DIAGNOSIS — L97329 Non-pressure chronic ulcer of left ankle with unspecified severity: Secondary | ICD-10-CM | POA: Diagnosis not present

## 2020-12-13 DIAGNOSIS — L97321 Non-pressure chronic ulcer of left ankle limited to breakdown of skin: Secondary | ICD-10-CM

## 2020-12-13 NOTE — Progress Notes (Signed)
History of Present Illness  There is no documented history at this time  Assessments & Plan   There are no diagnoses linked to this encounter.    Additional instructions  Subjective:  Patient presents with venous ulcer of the Bilateral lower extremity.    Procedure:  3 layer unna wrap was placed Bilateral lower extremity.   Plan:   Follow up in one week.  

## 2020-12-13 NOTE — Telephone Encounter (Signed)
Ciprofloxacin 500mg  take one tablet by mouth twice a day #20 was called into [facility] pharmacy.

## 2020-12-15 ENCOUNTER — Encounter (INDEPENDENT_AMBULATORY_CARE_PROVIDER_SITE_OTHER): Payer: Self-pay | Admitting: Nurse Practitioner

## 2020-12-17 ENCOUNTER — Other Ambulatory Visit: Payer: Self-pay

## 2020-12-17 ENCOUNTER — Ambulatory Visit
Admission: RE | Admit: 2020-12-17 | Discharge: 2020-12-17 | Disposition: A | Discharge status: Home / Self Care | Payer: Medicare HMO | Source: Ambulatory Visit | Attending: Nephrology | Admitting: Nephrology

## 2020-12-17 DIAGNOSIS — N1832 Chronic kidney disease, stage 3b: Secondary | ICD-10-CM | POA: Diagnosis not present

## 2020-12-18 ENCOUNTER — Encounter (INDEPENDENT_AMBULATORY_CARE_PROVIDER_SITE_OTHER): Payer: Self-pay

## 2020-12-18 ENCOUNTER — Ambulatory Visit (INDEPENDENT_AMBULATORY_CARE_PROVIDER_SITE_OTHER): Payer: Medicare HMO | Admitting: Nurse Practitioner

## 2020-12-18 ENCOUNTER — Other Ambulatory Visit (INDEPENDENT_AMBULATORY_CARE_PROVIDER_SITE_OTHER): Payer: Self-pay | Admitting: Nurse Practitioner

## 2020-12-18 VITALS — BP 150/80 | HR 71 | Resp 16

## 2020-12-18 DIAGNOSIS — L97321 Non-pressure chronic ulcer of left ankle limited to breakdown of skin: Secondary | ICD-10-CM

## 2020-12-18 DIAGNOSIS — L97329 Non-pressure chronic ulcer of left ankle with unspecified severity: Secondary | ICD-10-CM | POA: Diagnosis not present

## 2020-12-18 NOTE — Progress Notes (Signed)
History of Present Illness  There is no documented history at this time  Assessments & Plan   There are no diagnoses linked to this encounter.    Additional instructions  Subjective:  Patient presents with venous ulcer of the Bilateral lower extremity.    Procedure:  3 layer unna wrap was placed Bilateral lower extremity.   Plan:   Follow up in one week.  

## 2020-12-20 ENCOUNTER — Ambulatory Visit (INDEPENDENT_AMBULATORY_CARE_PROVIDER_SITE_OTHER): Payer: Medicare HMO | Admitting: Nurse Practitioner

## 2020-12-23 ENCOUNTER — Encounter (INDEPENDENT_AMBULATORY_CARE_PROVIDER_SITE_OTHER): Payer: Self-pay | Admitting: Nurse Practitioner

## 2020-12-26 LAB — AEROBIC CULTURE

## 2020-12-30 ENCOUNTER — Ambulatory Visit (INDEPENDENT_AMBULATORY_CARE_PROVIDER_SITE_OTHER): Payer: Medicare HMO | Admitting: Nurse Practitioner

## 2020-12-30 ENCOUNTER — Encounter (INDEPENDENT_AMBULATORY_CARE_PROVIDER_SITE_OTHER): Payer: Self-pay | Admitting: Nurse Practitioner

## 2020-12-30 ENCOUNTER — Other Ambulatory Visit: Payer: Self-pay

## 2020-12-30 VITALS — BP 163/70 | HR 69 | Resp 16

## 2020-12-30 DIAGNOSIS — L97329 Non-pressure chronic ulcer of left ankle with unspecified severity: Secondary | ICD-10-CM | POA: Diagnosis not present

## 2020-12-30 DIAGNOSIS — L03119 Cellulitis of unspecified part of limb: Secondary | ICD-10-CM

## 2020-12-30 DIAGNOSIS — I10 Essential (primary) hypertension: Secondary | ICD-10-CM

## 2020-12-31 MED ORDER — CIPROFLOXACIN HCL 500 MG PO TABS
500.0000 mg | ORAL_TABLET | Freq: Two times a day (BID) | ORAL | 0 refills | Status: DC
Start: 2020-12-31 — End: 2022-07-28

## 2021-01-03 ENCOUNTER — Encounter (INDEPENDENT_AMBULATORY_CARE_PROVIDER_SITE_OTHER): Payer: Self-pay

## 2021-01-03 ENCOUNTER — Other Ambulatory Visit: Payer: Self-pay

## 2021-01-03 ENCOUNTER — Ambulatory Visit (INDEPENDENT_AMBULATORY_CARE_PROVIDER_SITE_OTHER): Payer: Medicare HMO | Admitting: Nurse Practitioner

## 2021-01-03 VITALS — BP 145/83 | HR 76 | Resp 16

## 2021-01-03 DIAGNOSIS — L97329 Non-pressure chronic ulcer of left ankle with unspecified severity: Secondary | ICD-10-CM | POA: Diagnosis not present

## 2021-01-03 NOTE — Progress Notes (Signed)
History of Present Illness  There is no documented history at this time  Assessments & Plan   There are no diagnoses linked to this encounter.    Additional instructions  Subjective:  Patient presents with venous ulcer of the Bilateral lower extremity.Bilateral    Procedure:  3 layer unna wrap was placed Bilateral lower extremity.   Plan:   Follow up in one week.

## 2021-01-05 ENCOUNTER — Encounter (INDEPENDENT_AMBULATORY_CARE_PROVIDER_SITE_OTHER): Payer: Self-pay | Admitting: Nurse Practitioner

## 2021-01-05 NOTE — Progress Notes (Signed)
Subjective:    Patient ID: [name], female    DOB: 02/02/1946, 75 y.o.   MRN: [id number] Chief Complaint  Patient presents with   Follow-up    4wk unna check    Synae Ducre is a 75 year old female that presents today for evaluation of lymphedema as well as lower extremity wounds and ulcerations.  The ulcerations have improved and the drainage has significantly decreased.  The pain is also improved.  Overall she is progressing well.   Review of Systems  Cardiovascular:  Positive for leg swelling.  Skin:  Positive for wound.  All other systems reviewed and are negative.     Objective:   Physical Exam Vitals reviewed.  HENT:     Head: Normocephalic.  Cardiovascular:     Rate and Rhythm: Normal rate.     Pulses: Normal pulses.  Pulmonary:     Effort: Pulmonary effort is normal.  Skin:    General: Skin is warm and dry.  Neurological:     Mental Status: She is alert and oriented to person, place, and time.  Psychiatric:        Mood and Affect: Mood normal.        Behavior: Behavior normal.        Thought Content: Thought content normal.        Judgment: Judgment normal.    BP (!) 163/70 (BP Location: Left Arm)   Pulse 69   Resp 16   Past Medical History:  Diagnosis Date   Anemia    as young child   Arthritis    back, leg and foot   Blood transfusion    1964   Carpal tunnel syndrome    Diabetes mellitus    oral medications   Hypertension     Social History   Socioeconomic History   Marital status: Married    Spouse name: Not on file   Number of children: Not on file   Years of education: Not on file   Highest education level: Not on file  Occupational History   Not on file  Tobacco Use   Smoking status: Never   Smokeless tobacco: Never  Substance and Sexual Activity   Alcohol use: No   Drug use: No   Sexual activity: Not on file  Other Topics Concern   Not on file  Social History Narrative   Not on file   Social Determinants of Health    Financial Resource Strain: Not on file  Food Insecurity: Not on file  Transportation Needs: Not on file  Physical Activity: Not on file  Stress: Not on file  Social Connections: Not on file  Intimate Partner Violence: Not on file    Past Surgical History:  Procedure Laterality Date   ABDOMINAL HYSTERECTOMY     ANTERIOR CERVICAL DECOMP/DISCECTOMY FUSION  04/27/2011   Procedure: ANTERIOR CERVICAL DECOMPRESSION/DISCECTOMY FUSION 2 LEVELS;  Surgeon: Elaina Hoops, MD;  Location: [facility];  Service: Neurosurgery;  Laterality: N/A;  Anterior Cervical Decompression/ Discectomy Fusion Cervical Three-Four, Cervical Four-Five   APPENDECTOMY     KNEE ARTHROPLASTY     right   PERIPHERAL VASCULAR CATHETERIZATION Left 10/29/2014   Procedure: Lower Extremity Angiography;  Surgeon: Algernon Huxley, MD;  Location: [facility];  Service: Cardiovascular;  Laterality: Left;   PERIPHERAL VASCULAR CATHETERIZATION Left 10/29/2014   Procedure: Lower Extremity Intervention;  Surgeon: Algernon Huxley, MD;  Location: [facility];  Service: Cardiovascular;  Laterality: Left;  PERIPHERAL VASCULAR CATHETERIZATION Left 03/11/2015   Procedure: Lower Extremity Angiography;  Surgeon: Algernon Huxley, MD;  Location: [facility];  Service: Cardiovascular;  Laterality: Left;   PERIPHERAL VASCULAR CATHETERIZATION Left 03/11/2015   Procedure: Lower Extremity Intervention;  Surgeon: Algernon Huxley, MD;  Location: [facility];  Service: Cardiovascular;  Laterality: Left;    Family History  Problem Relation Age of Onset   Cancer Mother    Hypertension Mother    Diabetes Paternal Grandmother     Allergies  Allergen Reactions   Penicillins Shortness Of Breath and Swelling   Cheese Itching   Codeine Other (See Comments)    Ringing in ear TOLERATES Tramadol Other reaction(s): Other (See Comments) RINGING IN THE EARS Ringing in ear TOLERATES Tramadol   Strawberry (Diagnostic) Hives   Strawberry  Extract Hives    CBC Latest Ref Rng & Units 06/09/2016 08/10/2013 04/22/2011  WBC 3.6 - 11.0 K/uL 2.3(L) 4.0 8.2  Hemoglobin 12.0 - 16.0 g/dL 11.8(L) 11.5(L) 12.4  Hematocrit 35.0 - 47.0 % 35.9 36.1 37.0  Platelets 150 - 440 K/uL 151 196 228      CMP     Component Value Date/Time   NA 139 06/09/2016 1832   NA 139 08/10/2013 1315   K 3.4 (L) 06/09/2016 1832   K 3.7 08/10/2013 1315   CL 100 (L) 06/09/2016 1832   CL 105 08/10/2013 1315   CO2 29 06/09/2016 1832   CO2 28 08/10/2013 1315   GLUCOSE 295 (H) 06/09/2016 1832   GLUCOSE 310 (H) 08/10/2013 1315   BUN 27 (H) 06/09/2016 1832   BUN 15 08/10/2013 1315   CREATININE 1.65 (H) 06/09/2016 1832   CREATININE 1.15 08/10/2013 1315   CALCIUM 8.3 (L) 06/09/2016 1832   CALCIUM 8.8 08/10/2013 1315   PROT 6.8 06/09/2016 1832   ALBUMIN 3.4 (L) 06/09/2016 1832   AST 41 06/09/2016 1832   ALT 28 06/09/2016 1832   ALKPHOS 73 06/09/2016 1832   BILITOT 0.4 06/09/2016 1832   GFRNONAA 30 (L) 06/09/2016 1832   GFRNONAA 49 (L) 08/10/2013 1315   GFRAA 35 (L) 06/09/2016 1832   GFRAA 56 (L) 08/10/2013 1315     VAS [place] ABI WITH/WO TBI  Result Date: 12/03/2020  LOWER EXTREMITY DOPPLER STUDY Patient Name:  LARON PIQUE  Date of Exam:   11/22/2020 Medical Rec #: [id number]     Accession #:    [id number] Date of Birth: 1946/01/10     Patient Gender: F Patient Age:   34 years Exam Location:  [facility] Procedure:      VAS [place] ABI WITH/WO TBI Referring Phys: Eulogio Ditch --------------------------------------------------------------------------------  Indications: Ulceration, and peripheral artery disease.  Comparison Study: 06/20/2019 Performing Technologist: Concha Norway RVT  Examination Guidelines: A complete evaluation includes at minimum, Doppler waveform signals and systolic blood pressure reading at the level of bilateral brachial, anterior tibial, and posterior tibial arteries, when vessel segments are accessible. Bilateral testing is  considered an integral part of a complete examination. Photoelectric Plethysmograph (PPG) waveforms and toe systolic pressure readings are included as required and additional duplex testing as needed. Limited examinations for reoccurring indications may be performed as noted.  ABI Findings: +---------+------------------+-----+---------+--------+ Right    Rt Pressure (mmHg)IndexWaveform Comment  +---------+------------------+-----+---------+--------+ Brachial 140                                      +---------+------------------+-----+---------+--------+  ATA      153                    triphasic1.16     +---------+------------------+-----+---------+--------+ PTA      165               1.18 triphasic         +---------+------------------+-----+---------+--------+ Great Toe148               1.06 Normal            +---------+------------------+-----+---------+--------+ +---------+------------------+-----+---------+-------+ Left     Lt Pressure (mmHg)IndexWaveform Comment +---------+------------------+-----+---------+-------+ ATA      141                    triphasic1.01    +---------+------------------+-----+---------+-------+ PTA      166               1.19 triphasic1.18    +---------+------------------+-----+---------+-------+ Larene [place]               0.84 Normal           +---------+------------------+-----+---------+-------+ +-------+-----------+-----------+------------+------------+ ABI/TBIToday's ABIToday's TBIPrevious ABIPrevious TBI +-------+-----------+-----------+------------+------------+ Right  1.18       1.06       .83         .85          +-------+-----------+-----------+------------+------------+ Left   1.18       .84        1.21        1.27         +-------+-----------+-----------+------------+------------+ Right ABIs appear increased compared to prior study on 06/30/2019.  Summary: Right: Resting right ankle-brachial index is  within normal range. No evidence of significant right lower extremity arterial disease. The right toe-brachial index is normal. Left: Resting left ankle-brachial index is within normal range. No evidence of significant left lower extremity arterial disease. The left toe-brachial index is normal.  *See table(s) above for measurements and observations.  Electronically signed by Festus Barren MD on 12/03/2020 at 9:07:54 AM.    Final        Assessment & Plan:   1. Lower limb ulcer, ankle, left, with unspecified severity (HCC) The ulceration is improving.  We will continue to place the patient in Unna wraps.  We will change them on a weekly basis with reevaluation in 4 weeks.  2. Cellulitis of lower extremity, unspecified laterality Patient still has some drainage ongoing from her wounds and ulcerations.  Current wound culture and sensitivity show that the current antibiotic is effective for treatment. 3. Essential hypertension Continue antihypertensive medications as already ordered, these medications have been reviewed and there are no changes at this time.    Current Outpatient Medications on File Prior to Visit  Medication Sig Dispense Refill   Ascorbic Acid 500 MG CHEW Chew by mouth.     aspirin EC 81 MG tablet Take 81 mg by mouth daily.     celecoxib (CELEBREX) 200 MG capsule Take 200 mg by mouth daily.     clopidogrel (PLAVIX) 75 MG tablet Take 1 tablet (75 mg total) by mouth daily. 30 tablet 11   cyanocobalamin 1000 MCG tablet Take by mouth.     furosemide (LASIX) 20 MG tablet Take 20 mg by mouth daily.     glipiZIDE (GLUCOTROL XL) 5 MG 24 hr tablet Take 5 mg by mouth 2 (two) times daily.     lisinopril (ZESTRIL) 10 MG tablet Take 10 mg by mouth daily.  lisinopril (ZESTRIL) 10 MG tablet Take 10 mg by mouth daily.     metFORMIN (GLUCOPHAGE) 1000 MG tablet Take 1,000 mg by mouth 2 (two) times daily with a meal.     methocarbamol (ROBAXIN) 500 MG tablet Take 1 tablet (500 mg total) by mouth  4 (four) times daily. 20 tablet 0   metoprolol succinate (TOPROL-XL) 25 MG 24 hr tablet Take 1 tablet by mouth daily.     mupirocin ointment (BACTROBAN) 2 % Place 1 application on wound daily 22 g 0   ONETOUCH VERIO test strip 1 each 2 (two) times daily.     OZEMPIC, 0.25 OR 0.5 MG/DOSE, 2 MG/1.5ML SOPN Inject into the skin.     pregabalin (LYRICA) 25 MG capsule Take 50 mg by mouth 3 (three) times daily.      rosuvastatin (CRESTOR) 5 MG tablet Take 5 mg by mouth daily.     traMADol (ULTRAM) 50 MG tablet Take 1 tablet (50 mg total) by mouth every 6 (six) hours as needed. 20 tablet 0   liraglutide (VICTOZA) 18 MG/3ML SOPN Inject into the skin. (Patient not taking: Reported on 11/22/2020)     lisinopril-hydrochlorothiazide (PRINZIDE,ZESTORETIC) 20-25 MG per tablet Take 1 tablet by mouth daily. (Patient not taking: Reported on 11/22/2020)     sulfamethoxazole-trimethoprim (BACTRIM DS) 800-160 MG tablet Take 1 tablet by mouth 2 (two) times daily. (Patient not taking: Reported on 12/13/2020) 20 tablet 0   No current facility-administered medications on file prior to visit.    There are no Patient Instructions on file for this visit. No follow-ups on file.   Kris Hartmann, NP

## 2021-01-06 ENCOUNTER — Encounter (INDEPENDENT_AMBULATORY_CARE_PROVIDER_SITE_OTHER): Payer: Medicare HMO

## 2021-01-08 ENCOUNTER — Ambulatory Visit (INDEPENDENT_AMBULATORY_CARE_PROVIDER_SITE_OTHER): Payer: Medicare HMO | Admitting: Nurse Practitioner

## 2021-01-08 ENCOUNTER — Other Ambulatory Visit: Payer: Self-pay

## 2021-01-08 ENCOUNTER — Encounter (INDEPENDENT_AMBULATORY_CARE_PROVIDER_SITE_OTHER): Payer: Self-pay

## 2021-01-08 VITALS — BP 131/77 | HR 76 | Resp 16

## 2021-01-08 DIAGNOSIS — L97329 Non-pressure chronic ulcer of left ankle with unspecified severity: Secondary | ICD-10-CM | POA: Diagnosis not present

## 2021-01-08 NOTE — Progress Notes (Signed)
History of Present Illness  There is no documented history at this time  Assessments & Plan   There are no diagnoses linked to this encounter.    Additional instructions  Subjective:  Patient presents with venous ulcer of the Bilateral lower extremity.    Procedure:  3 layer unna wrap was placed Bilateral lower extremity.   Plan:   Follow up in one week.  

## 2021-01-10 ENCOUNTER — Encounter (INDEPENDENT_AMBULATORY_CARE_PROVIDER_SITE_OTHER): Payer: Medicare HMO

## 2021-01-12 ENCOUNTER — Encounter (INDEPENDENT_AMBULATORY_CARE_PROVIDER_SITE_OTHER): Payer: Self-pay | Admitting: Nurse Practitioner

## 2021-01-13 ENCOUNTER — Encounter (INDEPENDENT_AMBULATORY_CARE_PROVIDER_SITE_OTHER): Payer: Medicare HMO

## 2021-01-17 ENCOUNTER — Other Ambulatory Visit: Payer: Self-pay

## 2021-01-17 ENCOUNTER — Ambulatory Visit (INDEPENDENT_AMBULATORY_CARE_PROVIDER_SITE_OTHER): Payer: Medicare HMO | Admitting: Nurse Practitioner

## 2021-01-17 ENCOUNTER — Encounter: Payer: Medicare HMO | Attending: Physician Assistant | Admitting: Physician Assistant

## 2021-01-17 ENCOUNTER — Encounter (INDEPENDENT_AMBULATORY_CARE_PROVIDER_SITE_OTHER): Payer: Self-pay | Admitting: Nurse Practitioner

## 2021-01-17 VITALS — BP 130/72 | HR 69 | Resp 16

## 2021-01-17 DIAGNOSIS — E119 Type 2 diabetes mellitus without complications: Secondary | ICD-10-CM | POA: Diagnosis not present

## 2021-01-17 DIAGNOSIS — I872 Venous insufficiency (chronic) (peripheral): Secondary | ICD-10-CM | POA: Diagnosis present

## 2021-01-17 DIAGNOSIS — L97329 Non-pressure chronic ulcer of left ankle with unspecified severity: Secondary | ICD-10-CM

## 2021-01-17 DIAGNOSIS — L608 Other nail disorders: Secondary | ICD-10-CM | POA: Diagnosis not present

## 2021-01-17 NOTE — Progress Notes (Signed)
History of Present Illness  There is no documented history at this time  Assessments & Plan   There are no diagnoses linked to this encounter.    Additional instructions  Subjective:  Patient presents with venous ulcer of the Bilateral lower extremity.    Procedure:  3 layer unna wrap was placed Bilateral lower extremity.   Plan:   Follow up in one week.  

## 2021-01-17 NOTE — Progress Notes (Signed)
[name] ([id number]) Visit Report for 01/17/2021 Allergy List Details Patient Name: [name], [name] Date of Service: 01/17/2021 8:45 AM Medical Record Number: [id number] Patient Account Number: [id number] Date of Birth/Sex: 12-21-1945 (75 y.o. F) Treating RN: Yevonne Pax Primary Care Bryndle Corredor: Sherrie Mustache Other Clinician: Referring Kaleyah Labreck: Sherrie Mustache Treating Iridiana Fonner/Extender: Allen Derry Weeks in Treatment: 0 Allergies Active Allergies codeine penicillin Iodinated Contrast Media - IV Dye Allergy Notes Electronic Signature(s) Signed: 01/17/2021 4:25:56 PM By: Yevonne Pax RN Entered By: Yevonne Pax on 01/17/2021 09:20:09 STASSI, FADELY ([id number]) -------------------------------------------------------------------------------- Arrival Information Details Patient Name: [name] Date of Service: 01/17/2021 8:45 AM Medical Record Number: [id number] Patient Account Number: [id number] Date of Birth/Sex: [birth year] (75 y.o. F) Treating RN: Yevonne Pax Primary Care Shelvy Perazzo: Sherrie Mustache Other Clinician: Referring Chip Canepa: Sherrie Mustache Treating Korrina Zern/Extender: Rowan Blase in Treatment: 0 Visit Information Patient Arrived: Wheel Chair Arrival Time: 09:18 Accompanied By: son Transfer Assistance: None Patient Identification Verified: Yes Secondary Verification Process Completed: Yes Patient Requires Transmission-Based Precautions: No Patient Has Alerts: No History Since Last Visit All ordered tests and consults were completed: No Added or deleted any medications: No Any new allergies or adverse reactions: No Had a fall or experienced change in activities of daily living that may affect risk of falls: No Signs or symptoms of abuse/neglect since last visito No Hospitalized since last visit: No Implantable device outside of the clinic excluding cellular tissue based products placed in the center since last visit: No Has Dressing in Place as  Prescribed: Yes Has Compression in Place as Prescribed: Yes Electronic Signature(s) Signed: 01/17/2021 4:25:56 PM By: Yevonne Pax RN Entered By: Yevonne Pax on 01/17/2021 09:18:42 Foti, [name] ([id number]) -------------------------------------------------------------------------------- Clinic Level of Care Assessment Details Patient Name: [name] Date of Service: 01/17/2021 8:45 AM Medical Record Number: [id number] Patient Account Number: [id number] Date of Birth/Sex: 04/01/1945 (75 y.o. F) Treating RN: Yevonne Pax Primary Care Namiko Pritts: Sherrie Mustache Other Clinician: Referring Annarose Ouellet: Sherrie Mustache Treating Olyn Landstrom/Extender: Rowan Blase in Treatment: 0 Clinic Level of Care Assessment Items TOOL 4 Quantity Score X - Use when only an EandM is performed on FOLLOW-UP visit 1 0 ASSESSMENTS - Nursing Assessment / Reassessment X - Reassessment of Co-morbidities (includes updates in patient status) 1 10 X- 1 5 Reassessment of Adherence to Treatment Plan ASSESSMENTS - Wound and Skin Assessment / Reassessment []  - Simple Wound Assessment / Reassessment - one wound 0 []  - 0 Complex Wound Assessment / Reassessment - multiple wounds []  - 0 Dermatologic / Skin Assessment (not related to wound area) ASSESSMENTS - Focused Assessment []  - Circumferential Edema Measurements - multi extremities 0 []  - 0 Nutritional Assessment / Counseling / Intervention []  - 0 Lower Extremity Assessment (monofilament, tuning fork, pulses) []  - 0 Peripheral Arterial Disease Assessment (using hand held doppler) ASSESSMENTS - Ostomy and/or Continence Assessment and Care []  - Incontinence Assessment and Management 0 []  - 0 Ostomy Care Assessment and Management (repouching, etc.) PROCESS - Coordination of Care X - Simple Patient / Family Education for ongoing care 1 15 []  - 0 Complex (extensive) Patient / Family Education for ongoing care X- 1 10 Staff obtains , Records, Test Results  / Process Orders []  - 0 Staff telephones HHA, Nursing Homes / Clarify orders / etc []  - 0 Routine Transfer to another Facility (non-emergent condition) []  - 0 Routine Hospital Admission (non-emergent condition) X- 1 15 New Admissions / / Ordering NPWT, Apligraf, etc. []  - 0 Emergency Hospital  Admission (emergent condition) []  - 0 Simple Discharge Coordination []  - 0 Complex (extensive) Discharge Coordination PROCESS - Special Needs []  - Pediatric / Minor Patient Management 0 []  - 0 Isolation Patient Management []  - 0 Hearing / Language / Visual special needs []  - 0 Assessment of Community assistance (transportation, D/C planning, etc.) []  - 0 Additional assistance / Altered mentation []  - 0 Support Surface(s) Assessment (bed, cushion, seat, etc.) INTERVENTIONS - Wound Cleansing / Measurement Gowan, Krithika N. ( ) []  - 0 Simple Wound Cleansing - one wound []  - 0 Complex Wound Cleansing - multiple wounds []  - 0 Wound Imaging (photographs - any number of wounds) []  - 0 Wound Tracing (instead of photographs) []  - 0 Simple Wound Measurement - one wound []  - 0 Complex Wound Measurement - multiple wounds INTERVENTIONS - Wound Dressings []  - Small Wound Dressing one or multiple wounds 0 []  - 0 Medium Wound Dressing one or multiple wounds []  - 0 Large Wound Dressing one or multiple wounds []  - 0 Application of Medications - topical []  - 0 Application of Medications - injection INTERVENTIONS - Miscellaneous []  - External ear exam 0 []  - 0 Specimen Collection (cultures, biopsies, blood, body fluids, etc.) []  - 0 Specimen(s) / Culture(s) sent or taken to Lab for analysis []  - 0 Patient Transfer (multiple staff / / Similar devices) []  - 0 Simple Staple / Suture removal (25 or less) []  - 0 Complex Staple / Suture removal (26 or more) []  - 0 Hypo / Hyperglycemic Management (close monitor of Blood Glucose) []  - 0 Ankle /  Brachial Index (ABI) - do not check if billed separately X- 1 5 Vital Signs Has the patient been seen at the hospital within the last three years: Yes Total Score: 60 Level Of Care: New/Established - Level 2 Electronic Signature(s) Signed: 01/17/2021 4:25:56 PM By: RN Entered By: on 01/17/2021 10:00:56 Buss, ( ) -------------------------------------------------------------------------------- Encounter Discharge Information Details Patient Name: [id number] Date of Service: 01/17/2021 8:45 AM Medical Record Number: Patient Account Number: Date of Birth/Sex: 11-24-1945 (75 y.o. F) Treating RN: Primary Care Natasha Paulson: Other Clinician: Referring Dora Clauss: Treating Tatiyanna Lashley/Extender: in Treatment: 0 Encounter Discharge Information Items Discharge Condition: Stable Ambulatory Status: Wheelchair Discharge Destination: Home Transportation: Private Auto Accompanied By: son Schedule Follow-up Appointment: Yes Clinical Summary of Care: Patient Declined Electronic Signature(s) Signed: 01/17/2021 4:25:56 PM By: RN Entered By: on 01/17/2021 10:02:07 ( ) -------------------------------------------------------------------------------- Lower Extremity Assessment Details Patient Name: Nurse, adult Date of Service: 01/17/2021 8:45 AM Medical Record Number: Patient Account Number: Date of Birth/Sex: 06/29/1945 (75 y.o. F) Treating RN: Yevonne Pax Primary Care Rodneshia Greenhouse: Yevonne Pax Other Clinician: Referring Raffaella Edison: 14/03/2020 Treating Marlisa Caridi/Extender: [name] Weeks in Treatment: 0 Electronic Signature(s) Signed: 01/17/2021 4:25:56 PM By: [name] [name] RN Entered By: 14/03/2020 on 01/17/2021 09:31:28 AHONESTY, WOODFIN  (06/12/1945) -------------------------------------------------------------------------------- Multi Wound Chart Details Patient Name: 01-20-1975 Date of Service: 01/17/2021 8:45 AM Medical Record Number: Sherrie Mustache Patient Account Number: Sherrie Mustache Date of Birth/Sex: October 26, 1945 (75 y.o. F) Treating RN: Yevonne Pax Primary Care Lamarius Dirr: Yevonne Pax Other Clinician: Referring Kiptyn Rafuse: 14/03/2020 Treating Ceylin Dreibelbis/Extender: [name] in Treatment: 0 Vital Signs Height(in): 65 Pulse(bpm): 72 Weight(lbs): 296 Blood Pressure(mmHg): 177/97 Body Mass Index(BMI): 49 Temperature(F): 97.9 Respiratory Rate(breaths/min): 18 Wound Assessments Treatment Notes Electronic Signature(s) Signed: 01/17/2021 4:25:56 PM By: [name],  Lyla Son RN Entered By: Yevonne Pax on 01/17/2021 09:59:15 Fresquez, [name] ([id number]) -------------------------------------------------------------------------------- Multi-Disciplinary Care Plan Details Patient Name: ANNIYA, WHITERS Date of Service: 01/17/2021 8:45 AM Medical Record Number: [id number] Patient Account Number: [id number] Date of Birth/Sex: 01-24-1946 (75 y.o. F) Treating RN: Yevonne Pax Primary Care Kavitha Lansdale: Sherrie Mustache Other Clinician: Referring Anaiz Qazi: Sherrie Mustache Treating Yale Golla/Extender: Allen Derry Weeks in Treatment: 0 Active Inactive Electronic Signature(s) Signed: 01/17/2021 4:25:56 PM By: Yevonne Pax RN Entered By: Yevonne Pax on 01/17/2021 09:58:55 Strohmeyer, [name] ([id number]) -------------------------------------------------------------------------------- Pain Assessment Details Patient Name: [name] Date of Service: 01/17/2021 8:45 AM Medical Record Number: [id number] Patient Account Number: [id number] Date of Birth/Sex: May 17, 1945 (75 y.o. F) Treating RN: Yevonne Pax Primary Care Kishaun Erekson: Sherrie Mustache Other Clinician: Referring Tamberlyn Midgley: Sherrie Mustache Treating Latasha Buczkowski/Extender: Rowan Blase in  Treatment: 0 Active Problems Location of Pain Severity and Description of Pain Patient Has Paino No Site Locations Pain Management and Medication Current Pain Management: Electronic Signature(s) Signed: 01/17/2021 4:25:56 PM By: Yevonne Pax RN Entered By: Yevonne Pax on 01/17/2021 09:18:51 Hajduk, [name] ([id number]) -------------------------------------------------------------------------------- Patient/Caregiver Education Details Patient Name: [name] Date of Service: 01/17/2021 8:45 AM Medical Record Number: [id number] Patient Account Number: [id number] Date of Birth/Gender: January 25, 1946 (75 y.o. F) Treating RN: Yevonne Pax Primary Care Physician: Sherrie Mustache Other Clinician: Referring Physician: Sherrie Mustache Treating Physician/Extender: Rowan Blase in Treatment: 0 Education Assessment Education Provided To: Patient Education Topics Provided Wound/Skin Impairment: Methods: Explain/Verbal Responses: State content correctly Electronic Signature(s) Signed: 01/17/2021 4:25:56 PM By: Yevonne Pax RN Entered By: Yevonne Pax on 01/17/2021 10:01:19 KARLEI, WALDO ([id number]) -------------------------------------------------------------------------------- Vitals Details Patient Name: [name] Date of Service: 01/17/2021 8:45 AM Medical Record Number: [id number] Patient Account Number: [id number] Date of Birth/Sex: 1946-02-01 (75 y.o. F) Treating RN: Yevonne Pax Primary Care Oberon Hehir: Sherrie Mustache Other Clinician: Referring Maeley Matton: Sherrie Mustache Treating Aniak Cohick/Extender: Rowan Blase in Treatment: 0 Vital Signs Time Taken: 09:18 Temperature (F): 97.9 Height (in): 65 Pulse (bpm): 72 Source: Stated Respiratory Rate (breaths/min): 18 Weight (lbs): 296 Blood Pressure (mmHg): 177/97 Source: Stated Reference Range: 80 - 120 mg / dl Body Mass Index (BMI): 49.3 Electronic Signature(s) Signed: 01/17/2021 4:25:56 PM By: Yevonne Pax RN Entered  By: Yevonne Pax on 01/17/2021 09:19:30

## 2021-01-17 NOTE — Progress Notes (Signed)
ANIQA, HARE ([id number]) Visit Report for 01/17/2021 Abuse/Suicide Risk Screen Details Patient Name: [name], [name] Date of Service: 01/17/2021 8:45 AM Medical Record Number: [id number] Patient Account Number: [id number] Date of Birth/Sex: 1945/04/22 (75 y.o. F) Treating RN: Yevonne Pax Primary Care Cheyenna Pankowski: Sherrie Mustache Other Clinician: Referring Zahra Peffley: Sherrie Mustache Treating Emmett Bracknell/Extender: Rowan Blase in Treatment: 0 Abuse/Suicide Risk Screen Items Answer ABUSE RISK SCREEN: Has anyone close to you tried to hurt or harm you recentlyo No Do you feel uncomfortable with anyone in your familyo No Has anyone forced you do things that you didnot want to doo No Electronic Signature(s) Signed: 01/17/2021 4:25:56 PM By: Yevonne Pax RN Entered By: Yevonne Pax on 01/17/2021 09:21:30 Hartel, [name] ([id number]) -------------------------------------------------------------------------------- Activities of Daily Living Details Patient Name: [name] Date of Service: 01/17/2021 8:45 AM Medical Record Number: [id number] Patient Account Number: [id number] Date of Birth/Sex: 08/27/1945 (75 y.o. F) Treating RN: Yevonne Pax Primary Care Mitchael Luckey: Sherrie Mustache Other Clinician: Referring Dasja Brase: Sherrie Mustache Treating Kameelah Minish/Extender: Rowan Blase in Treatment: 0 Activities of Daily Living Items Answer Activities of Daily Living (Please select one for each item) Drive Automobile Completely Able Take Medications Completely Able Use Telephone Completely Able Care for Appearance Completely Able Use Toilet Completely Able Bath / Shower Completely Able Dress Self Completely Able Feed Self Completely Able Walk Completely Able Get In / Out Bed Completely Able Housework Completely Able Prepare Meals Completely Able Handle Money Completely Able Shop for Self Completely Able Electronic Signature(s) Signed: 01/17/2021 4:25:56 PM By: Yevonne Pax RN Entered By: Yevonne Pax on 01/17/2021 09:22:05 [name] ([id number]) -------------------------------------------------------------------------------- Education Screening Details Patient Name: [name] Date of Service: 01/17/2021 8:45 AM Medical Record Number: [id number] Patient Account Number: [id number] Date of Birth/Sex: 1945/12/28 (75 y.o. F) Treating RN: Yevonne Pax Primary Care Jasline Buskirk: Sherrie Mustache Other Clinician: Referring Domenik Trice: Sherrie Mustache Treating Julianna Vanwagner/Extender: Rowan Blase in Treatment: 0 Primary Learner Assessed: Patient Learning Preferences/Education Level/Primary Language Learning Preference: Explanation Highest Education Level: High School Preferred Language: English Cognitive Barrier Language Barrier: No Translator Needed: No Memory Deficit: No Emotional Barrier: No Cultural/Religious Beliefs Affecting Medical Care: No Physical Barrier Impaired Vision: No Impaired Hearing: No Decreased Hand dexterity: No Knowledge/Comprehension Knowledge Level: Medium Comprehension Level: Medium Ability to understand written instructions: High Ability to understand verbal instructions: High Motivation Anxiety Level: Anxious Cooperation: Cooperative Education Importance: Denies Need Interest in Health Problems: Uninterested Perception: Coherent Willingness to Engage in Self-Management Medium Activities: Readiness to Engage in Self-Management Medium Activities: Electronic Signature(s) Signed: 01/17/2021 4:25:56 PM By: Yevonne Pax RN Entered By: Yevonne Pax on 01/17/2021 09:22:47 [name] ([id number]) -------------------------------------------------------------------------------- Fall Risk Assessment Details Patient Name: [name] Date of Service: 01/17/2021 8:45 AM Medical Record Number: [id number] Patient Account Number: [id number] Date of Birth/Sex: [birth year] (75 y.o. F) Treating RN: Yevonne Pax Primary Care Ellena Kamen: Sherrie Mustache Other  Clinician: Referring Jaylea Plourde: Sherrie Mustache Treating Lael Pilch/Extender: Rowan Blase in Treatment: 0 Fall Risk Assessment Items Have you had 2 or more falls in the last 12 monthso 0 No Have you had any fall that resulted in injury in the last 12 monthso 0 No FALLS RISK SCREEN History of falling - immediate or within 3 months 0 No Secondary diagnosis (Do you have 2 or more medical diagnoseso) 0 No Ambulatory aid None/bed rest/wheelchair/nurse 0 No Crutches/cane/walker 0 No Furniture 0 No Intravenous therapy Access/Saline/Heparin Lock 0 No Gait/Transferring Normal/ bed rest/ wheelchair 0 No Weak (short steps with or without  shuffle, stooped but able to lift head while walking, may 0 No seek support from furniture) Impaired (short steps with shuffle, may have difficulty arising from chair, head down, impaired 0 No balance) Mental Status Oriented to own ability 0 No Electronic Signature(s) Signed: 01/17/2021 4:25:56 PM By: Yevonne Pax RN Entered By: Yevonne Pax on 01/17/2021 09:22:57 Neis, [name] ([id number]) -------------------------------------------------------------------------------- Foot Assessment Details Patient Name: [name] Date of Service: 01/17/2021 8:45 AM Medical Record Number: [id number] Patient Account Number: [id number] Date of Birth/Sex: May 14, 1945 (75 y.o. F) Treating RN: Yevonne Pax Primary Care Zemira Zehring: Sherrie Mustache Other Clinician: Referring Larin Depaoli: Sherrie Mustache Treating Sakara Lehtinen/Extender: Rowan Blase in Treatment: 0 Foot Assessment Items Site Locations + = Sensation present, - = Sensation absent, C = Callus, U = Ulcer R = Redness, W = Warmth, M = Maceration, PU = Pre-ulcerative lesion F = Fissure, S = Swelling, D = Dryness Assessment Right: Left: Other Deformity: No No Prior Foot Ulcer: No No Prior Amputation: No No Charcot Joint: No No Ambulatory Status: Ambulatory Without Help Gait: Unsteady Electronic  Signature(s) Signed: 01/17/2021 4:25:56 PM By: Yevonne Pax RN Entered By: Yevonne Pax on 01/17/2021 09:31:13 Lorman, [name] ([id number]) -------------------------------------------------------------------------------- Nutrition Risk Screening Details Patient Name: [name] Date of Service: 01/17/2021 8:45 AM Medical Record Number: [id number] Patient Account Number: [id number] Date of Birth/Sex: [birth year] (75 y.o. F) Treating RN: Yevonne Pax Primary Care Saraia Platner: Sherrie Mustache Other Clinician: Referring Dacari Beckstrand: Sherrie Mustache Treating Kenishia Plack/Extender: Rowan Blase in Treatment: 0 Height (in): 65 Weight (lbs): 296 Body Mass Index (BMI): 49.3 Nutrition Risk Screening Items Score Screening NUTRITION RISK SCREEN: I have an illness or condition that made me change the kind and/or amount of food I eat 0 No I eat fewer than two meals per day 0 No I eat few fruits and vegetables, or milk products 0 No I have three or more drinks of beer, liquor or wine almost every day 0 No I have tooth or mouth problems that make it hard for me to eat 0 No I don't always have enough money to buy the food I need 0 No I eat alone most of the time 0 No I take three or more different prescribed or over-the-counter drugs a day 1 Yes Without wanting to, I have lost or gained 10 pounds in the last six months 0 No I am not always physically able to shop, cook and/or feed myself 0 No Nutrition Protocols Good Risk Protocol 0 No interventions needed Moderate Risk Protocol High Risk Proctocol Risk Level: Good Risk Score: 1 Electronic Signature(s) Signed: 01/17/2021 4:25:56 PM By: Yevonne Pax RN Entered By: Yevonne Pax on 01/17/2021 09:23:15

## 2021-01-18 NOTE — Progress Notes (Signed)
[name], [name] ([id number]) Visit Report for 01/17/2021 Chief Complaint Document Details Patient Name: [name], [name] Date of Service: 01/17/2021 8:45 AM Medical Record Number: [id number] Patient Account Number: [id number] Date of Birth/Sex: 1945/02/21 (75 y.o. F) Treating RN: Carlene Coria Primary Care Provider: Casilda Carls Other Clinician: Referring Provider: Casilda Carls Treating Provider/Extender: Skipper Cliche in Treatment: 0 Information Obtained from: Patient Chief Complaint Right great toenail avulsion Electronic Signature(s) Signed: 01/17/2021 9:56:31 AM By: Worthy Keeler PA-C Entered By: Worthy Keeler on 01/17/2021 09:56:31 [name], [name] ([id number]) -------------------------------------------------------------------------------- HPI Details Patient Name: [name] Date of Service: 01/17/2021 8:45 AM Medical Record Number: [id number] Patient Account Number: [id number] Date of Birth/Sex: 07-Aug-1945 (75 y.o. F) Treating RN: Carlene Coria Primary Care Provider: Casilda Carls Other Clinician: Referring Provider: Casilda Carls Treating Provider/Extender: Skipper Cliche in Treatment: 0 History of Present Illness Location: left lower extremity Duration: left lower extremity ulcers have been present for approximately 2 weeks prior to initial evaluation at the wound center (blisters formed approx 3 weeks ago) and the left forearm for less than 1 week Context: the left lower extremity ulcers are secondary to edema and spontaneous blister formation HPI Description: Pleasant 75 year old with diabetes (no hemoglobin A1c available) and severe peripheral vascular disease. She reports a prior left lower extremity stent. Records unavailable. Underwent multiple level angioplasty of left lower extremity 10/29/2014 by Dr. Lucky Cowboy. Developed left calf ulcerations in late December 2016. Denies any trauma. Seen by her PCP. Bactrim prescribed. Referred to the wound clinic. Biopsy  culture 02/20/2015 grew methicillin sensitive staph aureus, sensitive to Bactrim. Performing dressing changes with Prisma. Using a Tubigrip for edema control. Scheduled to see Dr. Lucky Cowboy in follow-up 02/25/2015. Cancelled because of weather. Rescheduled for 03/06/2015. She returns to clinic for follow-up and is without new complaints. Pain improved. No ischemic rest pain. No fever or chills. Minimal drainage. 03/13/2015 -- records obtained from [facility] shows that she was seen by Dr. dew on 03/12/2015 and given tramadol and other symptomatic treatment. Recent ABI done showed right to be 0.94 and left to be 0.47 a left lower extremity arterial duplex was notable for an occluded stent and distal ATA. Recommendations were for left lower extremity angiogram with intervention. on 03/11/2015 she was taken up for a aortogram with angioplasty of the left posterior tibial artery and tibioperoneal trunk, above-knee popliteal artery and almost entire SFA, stent placement to the SFA and popliteal artery. 03/20/15 her arterial interventions noted. The patient states her left leg feels better. He ischemic wound on her left anterior leg. She has been using Santyl 04/09/15; the patient arrives back today. 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 dew. I was concerned about her last week as she had weeping edema fluid and areas of threatened ulceration around her wound. I ordered a silver alginate, Profore light wrap. She comes back today with an Haematologist on apparently applied by Dr. Bunnie Domino office. Her edema is under much better control and the weeping area here last week surrounding her major wound has resolved. She does have another open area which is small and superficial. I'm not sure I would put an Unna boot on this lady's degree of arterial insufficiency. 04/16/15; her original small wound appears to be healthy with good granulation her measurements  are improved. She has a small area underneath this. Both underwent selective debridement since surface slough 04/23/15; the original wound has filled in there is healthy granulation  here. The small area underneath this is healed. No debridement was required 04/30/15; the wound no longer has any depth. no Debridement required 05/07/15; the wound appears to be stable. Advancing epithelialization. Decrease wound volume. No debridement is required. She has known arterial disease status post stent placement to the SFA and popliteal artery. 05/14/15 unfortunately the wound is not really decreased much this week she still has a rim of epithelialization. The wound bed looks stable nevertheless I did a surgical debridement and there is some adherent surface slough. She has known PAD however the blood supply appears to be adequate status post stent to the SFA and popliteal and popliteal artery 05/21/15 wound is healthier than last week. No debridement is necessary. Known PAD status post stent to the SFA and popliteal artery 05/28/15; wound is no different from last week and in fact looks "stalled". Fairly we put Iodoflex on him last week however home health used Prisma 06/04/15 if anything the wound area appears to be larger. 06/11/15 switch to silver alginate last week and this seems to have helped. There is a thin layer of epithelialization only visible under the light 06/18/15; the patient had her leg traumatized when a grandchild fell over her leg at a party last weekend. Her original wound is just about closed very tiny however she has a new larger superficial wound just distal to the original wound. No evidence of infection 06/25/15; both of the wounds on her left leg including the chronic wound we have been treating her for a prolonged period of time now, and the traumatic wound from last week both are fully epithelialized. She apparently had a slip this week and managed to lift her nail off her right  great toe. 07/02/15; patient's legs are carefully inspected. Her original wound on the left medial leg is completely healed. Traumatic area on her right great toenail from last week is also healed. She has combined venous and arterial insufficiency we have obtained her juxtalite stockings bilaterally. 07/16/15 this is a patient we recently discharged. She states that the juxtalite stocking cause irritation and blistering on her anterior left leg. She is not therefore used them in the last 6 days 07/23/15; once again the patient has no open area here. Her edema is well controlled. She is very reluctant to try the juxtalite's again because she attributes this to the blistering on her left anterior leg. Both the medial original wound, and the blister on the anterior left leg give healed === 05/19/16- the patient is here for initial evaluation of her left lower extremity ulcers and a burn to the left forearm. She states that upon discharge last year she was given juxta light compression wraps, she could not tolerate those and was given compression stockings which she recently converted to compression socks. She states that the compression socks are "medium" and is unable to articulate the exact millimeters of mercury. She states that the left lower extremity developed spontaneous blisters which eventually ulcerated 2 weeks ago. She has not been wearing compression stockings or socks to either extremity since this occurred. She states that she burned her left forearm on a hot pan while cooking for [employer]. She has been applying no topical agents to either. She is a diabetic, she is unaware of her recent A1c but states her morning glucose was 197. She has a follow-up with her PCP, Dr.Jadali this Friday. She has known mixed arterial and venous disease. 05/27/16; the patient has a burn suffered on her left  arm while moving pots on her stove. She has been using hydrogel to this. She also has venous  insufficiency wounds on the left lower extremity. These apparently occurred while she was wearing some form of compression although I'm really not sure how aggressive the compression was. I suspect this was inadequate to maintain her edema control. Nevertheless wasn't really clear to me after talking to her today. [name], [name] ([id number]) 06/03/16; the patient's burn injury on her left arm is fully resolved. Venous insufficiency wound in the left lower extremity also looks considerably better under compression. In discussion today and is not really clear what stocking she had at home. She did have juxtalite stockings but this caused a severe contact dermatitis type rash therefore she is not eligible for this which is unfortunate 06/12/16 Patient appears to be doing very well in her leftover study wounds have completely healed on evaluation today. She has been tolerating her compression at home although we do have a two layer compression hose that has been ordered and will be applied going forward today. She is pleased with how this progress. 06/26/2016 -- the patient has been using her dual layer compression stockings as done well with this. We spent some time discussing the right way to use it the timing to remove it and have discussed elevation and exercise in great detail. She will also follow-up with Dr. Lucky Cowboy has an appointment to see her in the first week of June Readmission: 01/17/2021 upon evaluation today patient appears to present for reevaluation at this point regarding a wound on her right great toe she tells me. With that being said there it actually does not appear to be a wound here it does appear that she had an area on her right great toe where the nail got pulled off but is not even bleeding there is no exposure of underlying tissue and there is no evidence of infection. The biggest thing here I think is that the patient probably needs to be seen by podiatry for nail care in general I  believe fungal infection of the nails is a primary concern here as well. She is seeing vein and vascular for her legs were not doing anything with that at this point they have been wrapping her with Unna boots and I discussed with her and the son getting some type of Velcro wrap in order to help with compression control long-term going forward. Electronic Signature(s) Signed: 01/17/2021 5:36:58 PM By: Worthy Keeler PA-C Entered By: Worthy Keeler on 01/17/2021 17:36:58 Alfredo, [name] ([id number]) -------------------------------------------------------------------------------- Physical Exam Details Patient Name: [name] Date of Service: 01/17/2021 8:45 AM Medical Record Number: [id number] Patient Account Number: [id number] Date of Birth/Sex: Dec 23, 1945 (75 y.o. F) Treating RN: Carlene Coria Primary Care Provider: Casilda Carls Other Clinician: Referring Provider: Casilda Carls Treating Provider/Extender: Skipper Cliche in Treatment: 0 Constitutional patient is hypertensive.. pulse regular and within target range for patient.Marland Kitchen respirations regular, non-labored and within target range for patient.Marland Kitchen temperature within target range for patient.. Well-nourished and well-hydrated in no acute distress. Eyes conjunctiva clear no eyelid edema noted. pupils equal round and reactive to light and accommodation. Ears, Nose, Mouth, and Throat no gross abnormality of ear auricles or external auditory canals. normal hearing noted during conversation. mucus membranes moist. Respiratory normal breathing without difficulty. Cardiovascular 2+ dorsalis pedis/posterior tibialis pulses. 1+ pitting edema of the bilateral lower extremities. Musculoskeletal normal gait and posture. no significant deformity or arthritic changes, no loss or range of  motion, no clubbing. Psychiatric this patient is able to make decisions and demonstrates good insight into disease process. Alert and Oriented x 3. pleasant  and cooperative. Notes Upon inspection again the patient has no open wounds at this point that we could visualize. She does have an issue with the right great toenail having been torn off and I think there are several other toenails that are at risk of this if she does not get them trimmed in short order. I do believe she should be seen by podiatry as soon as possible and I recommended a referral to [facility] to see if they can help her out in this regard. Electronic Signature(s) Signed: 01/17/2021 5:37:33 PM By: Lenda Kelp PA-C Entered By: Lenda Kelp on 01/17/2021 17:37:32 Strnad, [name] ([id number]) -------------------------------------------------------------------------------- Physician Orders Details Patient Name: [name] Date of Service: 01/17/2021 8:45 AM Medical Record Number: [id number] Patient Account Number: [id number] Date of Birth/Sex: 12-28-1945 (75 y.o. F) Treating RN: Yevonne Pax Primary Care Provider: Sherrie Mustache Other Clinician: Referring Provider: Sherrie Mustache Treating Provider/Extender: Rowan Blase in Treatment: 0 Verbal / Phone Orders: No Diagnosis Coding ICD-10 Coding Code Description I87.2 Venous insufficiency (chronic) (peripheral) E11.621 Type 2 diabetes mellitus with foot ulcer L60.8 Other nail disorders Discharge From [facility] o Consult Only Consults o Podiatry - ref: for nail care - (ICD10 L60.8 - Other nail disorders) Electronic Signature(s) Signed: 01/17/2021 4:25:56 PM By: Yevonne Pax RN Signed: 01/17/2021 5:38:58 PM By: Lenda Kelp PA-C Entered By: Yevonne Pax on 01/17/2021 10:00:30 LASHANE, WHELPLEY ([id number]) -------------------------------------------------------------------------------- Problem List Details Patient Name: [name] Date of Service: 01/17/2021 8:45 AM Medical Record Number: [id number] Patient Account Number: [id number] Date of Birth/Sex: 1945-06-07 (75 y.o. F) Treating RN: Yevonne Pax Primary Care Provider: Sherrie Mustache Other Clinician: Referring Provider: Sherrie Mustache Treating Provider/Extender: Allen Derry Weeks in Treatment: 0 Active Problems ICD-10 Encounter Code Description Active Date MDM Diagnosis I87.2 Venous insufficiency (chronic) (peripheral) 01/17/2021 No Yes E11.621 Type 2 diabetes mellitus with foot ulcer 01/17/2021 No Yes L60.8 Other nail disorders 01/17/2021 No Yes Inactive Problems Resolved Problems Electronic Signature(s) Signed: 01/17/2021 9:54:57 AM By: Lenda Kelp PA-C Entered By: Lenda Kelp on 01/17/2021 09:54:57 Kiesler, [name] ([id number]) -------------------------------------------------------------------------------- Progress Note/History and Physical Details Patient Name: [name] Date of Service: 01/17/2021 8:45 AM Medical Record Number: [id number] Patient Account Number: [id number] Date of Birth/Sex: 08/04/1945 (75 y.o. F) Treating RN: Yevonne Pax Primary Care Provider: Sherrie Mustache Other Clinician: Referring Provider: Sherrie Mustache Treating Provider/Extender: Rowan Blase in Treatment: 0 Subjective Chief Complaint Information obtained from Patient Right great toenail avulsion History of Present Illness (HPI) The following HPI elements were documented for the patient's wound: Location: left lower extremity Duration: left lower extremity ulcers have been present for approximately 2 weeks prior to initial evaluation at the wound center (blisters formed approx 3 weeks ago) and the left forearm for less than 1 week Context: the left lower extremity ulcers are secondary to edema and spontaneous blister formation Pleasant 75 year old with diabetes (no hemoglobin A1c available) and severe peripheral vascular disease. She reports a prior left lower extremity stent. Records unavailable. Underwent multiple level angioplasty of left lower extremity 10/29/2014 by Dr. Wyn Quaker. Developed left calf ulcerations in late  December 2016. Denies any trauma. Seen by her PCP. Bactrim prescribed. Referred to the wound clinic. Biopsy culture 02/20/2015 grew methicillin sensitive staph aureus, sensitive to Bactrim. Performing dressing changes with Prisma. Using a Tubigrip for  edema control. Scheduled to see Dr. Lucky Cowboy in follow-up 02/25/2015. Cancelled because of weather. Rescheduled for 03/06/2015. She returns to clinic for follow-up and is without new complaints. Pain improved. No ischemic rest pain. No fever or chills. Minimal drainage. 03/13/2015 -- records obtained from [facility] shows that she was seen by Dr. dew on 03/12/2015 and given tramadol and other symptomatic treatment. Recent ABI done showed right to be 0.94 and left to be 0.47 a left lower extremity arterial duplex was notable for an occluded stent and distal ATA. Recommendations were for left lower extremity angiogram with intervention. on 03/11/2015 she was taken up for a aortogram with angioplasty of the left posterior tibial artery and tibioperoneal trunk, above-knee popliteal artery and almost entire SFA, stent placement to the SFA and popliteal artery. 03/20/15 her arterial interventions noted. The patient states her left leg feels better. He ischemic wound on her left anterior leg. She has been using Santyl 04/09/15; the patient arrives back today. 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 dew. I was concerned about her last week as she had weeping edema fluid and areas of threatened ulceration around her wound. I ordered a silver alginate, Profore light wrap. She comes back today with an Haematologist on apparently applied by Dr. Bunnie Domino office. Her edema is under much better control and the weeping area here last week surrounding her major wound has resolved. She does have another open area which is small and superficial. I'm not sure I would put an Unna boot on this lady's degree of arterial  insufficiency. 04/16/15; her original small wound appears to be healthy with good granulation her measurements are improved. She has a small area underneath this. Both underwent selective debridement since surface slough 04/23/15; the original wound has filled in there is healthy granulation here. The small area underneath this is healed. No debridement was required 04/30/15; the wound no longer has any depth. no Debridement required 05/07/15; the wound appears to be stable. Advancing epithelialization. Decrease wound volume. No debridement is required. She has known arterial disease status post stent placement to the SFA and popliteal artery. 05/14/15 unfortunately the wound is not really decreased much this week she still has a rim of epithelialization. The wound bed looks stable nevertheless I did a surgical debridement and there is some adherent surface slough. She has known PAD however the blood supply appears to be adequate status post stent to the SFA and popliteal and popliteal artery 05/21/15 wound is healthier than last week. No debridement is necessary. Known PAD status post stent to the SFA and popliteal artery 05/28/15; wound is no different from last week and in fact looks "stalled". Fairly we put Iodoflex on him last week however home health used Prisma 06/04/15 if anything the wound area appears to be larger. 06/11/15 switch to silver alginate last week and this seems to have helped. There is a thin layer of epithelialization only visible under the light 06/18/15; the patient had her leg traumatized when a grandchild fell over her leg at a party last weekend. Her original wound is just about closed very tiny however she has a new larger superficial wound just distal to the original wound. No evidence of infection 06/25/15; both of the wounds on her left leg including the chronic wound we have been treating her for a prolonged period of time now, and the traumatic wound from last week both are  fully epithelialized. She apparently had a  slip this week and managed to lift her nail off her right great toe. 07/02/15; patient's legs are carefully inspected. Her original wound on the left medial leg is completely healed. Traumatic area on her right great toenail from last week is also healed. She has combined venous and arterial insufficiency we have obtained her juxtalite stockings bilaterally. 07/16/15 this is a patient we recently discharged. She states that the juxtalite stocking cause irritation and blistering on her anterior left leg. She is not therefore used them in the last 6 days 07/23/15; once again the patient has no open area here. Her edema is well controlled. She is very reluctant to try the juxtalite's again because she attributes this to the blistering on her left anterior leg. Both the medial original wound, and the blister on the anterior left leg give healed === 05/19/16- the patient is here for initial evaluation of her left lower extremity ulcers and a burn to the left forearm. She states that upon discharge last year she was given juxta light compression wraps, she could not tolerate those and was given compression stockings which she recently converted to compression socks. She states that the compression socks are "medium" and is unable to articulate the exact millimeters of mercury. She states that the left lower extremity developed spontaneous blisters which eventually ulcerated 2 weeks ago. She has not been wearing compression stockings or socks to either extremity since this occurred. She states that she burned her left forearm on a hot pan while cooking for [employer]. ([id number]) Easter dinner. She has been applying no topical agents to either. She is a diabetic, she is unaware of her recent A1c but states her morning glucose was 197. She has a follow-up with her PCP, Dr.Jadali this Friday. She has known mixed arterial and venous disease. 05/27/16; the patient has a  burn suffered on her left arm while moving pots on her stove. She has been using hydrogel to this. She also has venous insufficiency wounds on the left lower extremity. These apparently occurred while she was wearing some form of compression although I'm really not sure how aggressive the compression was. I suspect this was inadequate to maintain her edema control. Nevertheless wasn't really clear to me after talking to her today. 06/03/16; the patient's burn injury on her left arm is fully resolved. Venous insufficiency wound in the left lower extremity also looks considerably better under compression. In discussion today and is not really clear what stocking she had at home. She did have juxtalite stockings but this caused a severe contact dermatitis type rash therefore she is not eligible for this which is unfortunate 06/12/16 Patient appears to be doing very well in her leftover study wounds have completely healed on evaluation today. She has been tolerating her compression at home although we do have a two layer compression hose that has been ordered and will be applied going forward today. She is pleased with how this progress. 06/26/2016 -- the patient has been using her dual layer compression stockings as done well with this. We spent some time discussing the right way to use it the timing to remove it and have discussed elevation and exercise in great detail. She will also follow-up with Dr. Lucky Cowboy has an appointment to see her in the first week of June Readmission: 01/17/2021 upon evaluation today patient appears to present for reevaluation at this point regarding a wound on her right great toe she tells me. With that being said there it actually  does not appear to be a wound here it does appear that she had an area on her right great toe where the nail got pulled off but is not even bleeding there is no exposure of underlying tissue and there is no evidence of infection. The biggest thing here  I think is that the patient probably needs to be seen by podiatry for nail care in general I believe fungal infection of the nails is a primary concern here as well. She is seeing vein and vascular for her legs were not doing anything with that at this point they have been wrapping her with Unna boots and I discussed with her and the son getting some type of Velcro wrap in order to help with compression control long-term going forward. Patient History Information obtained from Patient. Allergies codeine, penicillin, Iodinated Contrast Media - IV Dye Social History Never smoker, Marital Status - Married, Alcohol Use - Never, Drug Use - No History, Caffeine Use - Never. Medical History Eyes Denies history of Cataracts, Glaucoma, Optic Neuritis Ear/Nose/Mouth/Throat Denies history of Chronic sinus problems/congestion, Middle ear problems Hematologic/Lymphatic Denies history of Anemia, Hemophilia, Human Immunodeficiency Virus, Lymphedema, Sickle Cell Disease Respiratory Denies history of Aspiration, Asthma, Chronic Obstructive Pulmonary Disease (COPD), Pneumothorax, Sleep Apnea, Tuberculosis Cardiovascular Patient has history of Hypertension, Peripheral Arterial Disease Denies history of Peripheral Venous Disease Gastrointestinal Denies history of Cirrhosis , Colitis, Crohn s, Hepatitis A, Hepatitis B, Hepatitis C Endocrine Patient has history of Type II Diabetes Genitourinary Denies history of End Stage Renal Disease Immunological Denies history of Lupus Erythematosus, Raynaud s, Scleroderma Musculoskeletal Patient has history of Osteoarthritis Denies history of Gout, Rheumatoid Arthritis, Osteomyelitis Neurologic Patient has history of Neuropathy Denies history of Dementia, Quadriplegia, Paraplegia, Seizure Disorder Oncologic Denies history of Received Chemotherapy, Received Radiation Patient is treated with Oral Agents. Blood sugar is tested. [address] [place], [place]  ([id number]) Constitutional patient is hypertensive.. pulse regular and within target range for patient.Marland Kitchen respirations regular, non-labored and within target range for patient.Marland Kitchen temperature within target range for patient.. Well-nourished and well-hydrated in no acute distress. Vitals Time Taken: 9:18 AM, Height: 65 in, Source: Stated, Weight: 296 lbs, Source: Stated, BMI: 49.3, Temperature: 97.9 F, Pulse: 72 bpm, Respiratory Rate: 18 breaths/min, Blood Pressure: 177/97 mmHg. Eyes conjunctiva clear no eyelid edema noted. pupils equal round and reactive to light and accommodation. Ears, Nose, Mouth, and Throat no gross abnormality of ear auricles or external auditory canals. normal hearing noted during conversation. mucus membranes moist. Respiratory normal breathing without difficulty. Cardiovascular 2+ dorsalis pedis/posterior tibialis pulses. 1+ pitting edema of the bilateral lower extremities. Musculoskeletal normal gait and posture. no significant deformity or arthritic changes, no loss or range of motion, no clubbing. Psychiatric this patient is able to make decisions and demonstrates good insight into disease process. Alert and Oriented x 3. pleasant and cooperative. General Notes: Upon inspection again the patient has no open wounds at this point that we could visualize. She does have an issue with the right great toenail having been torn off and I think there are several other toenails that are at risk of this if she does not get them trimmed in short order. I do believe she should be seen by podiatry as soon as possible and I recommended a referral to [facility] to see if they can help her out in this regard. Assessment Active Problems ICD-10 Venous insufficiency (chronic) (peripheral) Type 2 diabetes mellitus with foot ulcer Other nail disorders Plan Discharge From [facility]:  Consult Only Consults ordered were: Podiatry - ref: for nail care 1. I would recommend  referral to [facility] for nail care I think that is the main thing she needs she has no open wounds at this point. 2. I am also can recommend that the patient continue to elevate her legs I did give her information for juxta lite compression wraps that can be ordered once they get sizing figured out and that is something I sent with him today for the vascular clinic to look at as well I like the juxta lites because they can make sure they get it tight enough when you put it on so there is no confusion in that regard. We will see the patient back for follow-up visit as needed. Electronic Signature(s) Signed: 01/17/2021 5:38:10 PM By: Lenda KelpStone III, Dalphine Cowie PA-C Entered By: Lenda KelpStone III, Natanya Holecek on 01/17/2021 17:38:09 Hurless, [name] [name] ([id number]) -------------------------------------------------------------------------------- ROS/PFSH Details Patient Name: [name] InfanteWOODS, [name] Date of Service: 01/17/2021 8:45 AM Medical Record Number: [id number] Patient Account Number: [id number] Date of Birth/Sex: December 11, 1945 (75 y.o. F) Treating RN: Yevonne PaxEpps, Carrie Primary Care Provider: Sherrie MustacheJadali, Fayegh Other Clinician: Referring Provider: Sherrie MustacheJadali, Fayegh Treating Provider/Extender: Rowan BlaseStone, Cyrene Gharibian Weeks in Treatment: 0 Label Progress Note Print Version as History and Physical for this encounter Information Obtained From Patient Eyes Medical History: Negative for: Cataracts; Glaucoma; Optic Neuritis Ear/Nose/Mouth/Throat Medical History: Negative for: Chronic sinus problems/congestion; Middle ear problems Hematologic/Lymphatic Medical History: Negative for: Anemia; Hemophilia; Human Immunodeficiency Virus; Lymphedema; Sickle Cell Disease Respiratory Medical History: Negative for: Aspiration; Asthma; Chronic Obstructive Pulmonary Disease (COPD); Pneumothorax; Sleep Apnea; Tuberculosis Cardiovascular Medical History: Positive for: Hypertension; Peripheral Arterial Disease Negative for: Peripheral Venous  Disease Gastrointestinal Medical History: Negative for: Cirrhosis ; Colitis; Crohnos; Hepatitis A; Hepatitis B; Hepatitis C Endocrine Medical History: Positive for: Type II Diabetes Time with diabetes: 10 years Treated with: Oral agents Blood sugar tested every day: Yes Tested : qd Genitourinary Medical History: Negative for: End Stage Renal Disease Immunological Medical History: Negative for: Lupus Erythematosus; Raynaudos; Scleroderma Musculoskeletal Medical History: Positive for: Osteoarthritis Negative for: Gout; Rheumatoid Arthritis; Osteomyelitis Laffoon, [name] ([id number]) Neurologic Medical History: Positive for: Neuropathy Negative for: Dementia; Quadriplegia; Paraplegia; Seizure Disorder Oncologic Medical History: Negative for: Received Chemotherapy; Received Radiation Immunizations Pneumococcal Vaccine: Received Pneumococcal Vaccination: No Implantable Devices No devices added Family and Social History Never smoker; Marital Status - Married; Alcohol Use: Never; Drug Use: No History; Caffeine Use: Never; Financial Concerns: No; Food, Clothing or Shelter Needs: No; Support System Lacking: No; Transportation Concerns: No Electronic Signature(s) Signed: 01/17/2021 4:25:56 PM By: Yevonne PaxEpps, Carrie RN Signed: 01/17/2021 5:38:58 PM By: Lenda KelpStone III, Makaylia Hewett PA-C Entered By: Yevonne PaxEpps, Carrie on 01/17/2021 09:21:21 [name] InfanteWOODS, Shanan N. ([id number]) -------------------------------------------------------------------------------- SuperBill Details Patient Name: [name] InfanteWOODS, Myrtle N. Date of Service: 01/17/2021 Medical Record Number: [id number] Patient Account Number: [id number] Date of Birth/Sex: December 11, 1945 (75 y.o. F) Treating RN: Yevonne PaxEpps, Carrie Primary Care Provider: Sherrie MustacheJadali, Fayegh Other Clinician: Referring Provider: Sherrie MustacheJadali, Fayegh Treating Provider/Extender: Rowan BlaseStone, Kaelea Gathright Weeks in Treatment: 0 Diagnosis Coding ICD-10 Codes Code Description I87.2 Venous insufficiency (chronic)  (peripheral) E11.621 Type 2 diabetes mellitus with foot ulcer L60.8 Other nail disorders Facility Procedures CPT4 Code: 2440102776100137 Description: [id number] - WOUND CARE VISIT-LEV 2 EST PT Modifier: Quantity: 1 Physician Procedures CPT4 Code: 44034746770465 Description: WC PHYS LEVEL 3 o NEW PT Modifier: Quantity: 1 CPT4 Code: Description: ICD-10 Diagnosis Description I87.2 Venous insufficiency (chronic) (peripheral) E11.621 Type 2 diabetes mellitus with foot ulcer L60.8 Other nail disorders Modifier: Quantity: Electronic Signature(s) Signed: 01/17/2021 5:38:39  PM By: Worthy Keeler PA-C Previous Signature: 01/17/2021 4:25:56 PM Version By: Carlene Coria RN Entered By: Worthy Keeler on 01/17/2021 17:38:39

## 2021-01-20 ENCOUNTER — Encounter (INDEPENDENT_AMBULATORY_CARE_PROVIDER_SITE_OTHER): Payer: Medicare HMO

## 2021-01-24 ENCOUNTER — Other Ambulatory Visit: Payer: Self-pay

## 2021-01-24 ENCOUNTER — Ambulatory Visit (INDEPENDENT_AMBULATORY_CARE_PROVIDER_SITE_OTHER): Payer: Medicare HMO | Admitting: Nurse Practitioner

## 2021-01-24 ENCOUNTER — Encounter (INDEPENDENT_AMBULATORY_CARE_PROVIDER_SITE_OTHER): Payer: Self-pay | Admitting: Nurse Practitioner

## 2021-01-24 VITALS — BP 163/80 | HR 68 | Resp 16

## 2021-01-24 DIAGNOSIS — L97329 Non-pressure chronic ulcer of left ankle with unspecified severity: Secondary | ICD-10-CM

## 2021-01-24 DIAGNOSIS — I10 Essential (primary) hypertension: Secondary | ICD-10-CM | POA: Diagnosis not present

## 2021-01-24 DIAGNOSIS — E11622 Type 2 diabetes mellitus with other skin ulcer: Secondary | ICD-10-CM | POA: Diagnosis not present

## 2021-01-24 DIAGNOSIS — L98499 Non-pressure chronic ulcer of skin of other sites with unspecified severity: Secondary | ICD-10-CM | POA: Diagnosis not present

## 2021-01-25 ENCOUNTER — Encounter (INDEPENDENT_AMBULATORY_CARE_PROVIDER_SITE_OTHER): Payer: Self-pay | Admitting: Nurse Practitioner

## 2021-01-25 NOTE — Progress Notes (Signed)
Subjective:    Patient ID: [name], female    DOB: 1945/06/05, 75 y.o.   MRN: [id number] Chief Complaint  Patient presents with   Follow-up    4wk unna boot check     [name] is a 75 year old female that presents today for evaluation of her lower extremities after being in wraps for several weeks due to swelling in lower extremity ulceration.  Today the swelling is greatly controlled and the ulceration has resolved.  The patient has ordered some compression wraps and is awaiting their arrival.  Overall her legs are much improved.   Review of Systems  Cardiovascular:  Positive for leg swelling.  All other systems reviewed and are negative.     Objective:   Physical Exam Vitals reviewed.  HENT:     Head: Normocephalic.  Cardiovascular:     Rate and Rhythm: Normal rate.     Pulses: Normal pulses.  Pulmonary:     Effort: Pulmonary effort is normal.  Skin:    General: Skin is warm and dry.  Neurological:     Mental Status: She is alert and oriented to person, place, and time.     Motor: Weakness present.     Gait: Gait abnormal.  Psychiatric:        Mood and Affect: Mood normal.        Behavior: Behavior normal.        Thought Content: Thought content normal.        Judgment: Judgment normal.    BP (!) 163/80 (BP Location: Right Arm)   Pulse 68   Resp 16   Past Medical History:  Diagnosis Date   Anemia    as young child   Arthritis    back, leg and foot   Blood transfusion    1964   Carpal tunnel syndrome    Diabetes mellitus    oral medications   Hypertension     Social History   Socioeconomic History   Marital status: Married    Spouse name: Not on file   Number of children: Not on file   Years of education: Not on file   Highest education level: Not on file  Occupational History   Not on file  Tobacco Use   Smoking status: Never   Smokeless tobacco: Never  Substance and Sexual Activity   Alcohol use: No   Drug use: No   Sexual  activity: Not on file  Other Topics Concern   Not on file  Social History Narrative   Not on file   Social Determinants of Health   Financial Resource Strain: Not on file  Food Insecurity: Not on file  Transportation Needs: Not on file  Physical Activity: Not on file  Stress: Not on file  Social Connections: Not on file  Intimate Partner Violence: Not on file    Past Surgical History:  Procedure Laterality Date   ABDOMINAL HYSTERECTOMY     ANTERIOR CERVICAL DECOMP/DISCECTOMY FUSION  04/27/2011   Procedure: ANTERIOR CERVICAL DECOMPRESSION/DISCECTOMY FUSION 2 LEVELS;  Surgeon: Elaina Hoops, MD;  Location: [place] [facility];  Service: Neurosurgery;  Laterality: N/A;  Anterior Cervical Decompression/ Discectomy Fusion Cervical Three-Four, Cervical Four-Five   APPENDECTOMY     KNEE ARTHROPLASTY     right   PERIPHERAL VASCULAR CATHETERIZATION Left 10/29/2014   Procedure: Lower Extremity Angiography;  Surgeon: Algernon Huxley, MD;  Location: [place] [facility];  Service: Cardiovascular;  Laterality: Left;   PERIPHERAL VASCULAR CATHETERIZATION  Left 10/29/2014   Procedure: Lower Extremity Intervention;  Surgeon: Annice Needy, MD;  Location: [facility];  Service: Cardiovascular;  Laterality: Left;   PERIPHERAL VASCULAR CATHETERIZATION Left 03/11/2015   Procedure: Lower Extremity Angiography;  Surgeon: Annice Needy, MD;  Location: [facility];  Service: Cardiovascular;  Laterality: Left;   PERIPHERAL VASCULAR CATHETERIZATION Left 03/11/2015   Procedure: Lower Extremity Intervention;  Surgeon: Annice Needy, MD;  Location: [facility];  Service: Cardiovascular;  Laterality: Left;    Family History  Problem Relation Age of Onset   Cancer Mother    Hypertension Mother    Diabetes Paternal Grandmother     Allergies  Allergen Reactions   Penicillins Shortness Of Breath and Swelling   Cheese Itching   Codeine Other (See Comments)    Ringing in ear TOLERATES Tramadol Other  reaction(s): Other (See Comments) RINGING IN THE EARS Ringing in ear TOLERATES Tramadol   Strawberry (Diagnostic) Hives   Strawberry Extract Hives    CBC Latest Ref Rng & Units 06/09/2016 08/10/2013 04/22/2011  WBC 3.6 - 11.0 K/uL 2.3(L) 4.0 8.2  Hemoglobin 12.0 - 16.0 g/dL 11.8(L) 11.5(L) 12.4  Hematocrit 35.0 - 47.0 % 35.9 36.1 37.0  Platelets 150 - 440 K/uL 151 196 228      CMP     Component Value Date/Time   NA 139 06/09/2016 1832   NA 139 08/10/2013 1315   K 3.4 (L) 06/09/2016 1832   K 3.7 08/10/2013 1315   CL 100 (L) 06/09/2016 1832   CL 105 08/10/2013 1315   CO2 29 06/09/2016 1832   CO2 28 08/10/2013 1315   GLUCOSE 295 (H) 06/09/2016 1832   GLUCOSE 310 (H) 08/10/2013 1315   BUN 27 (H) 06/09/2016 1832   BUN 15 08/10/2013 1315   CREATININE 1.65 (H) 06/09/2016 1832   CREATININE 1.15 08/10/2013 1315   CALCIUM 8.3 (L) 06/09/2016 1832   CALCIUM 8.8 08/10/2013 1315   PROT 6.8 06/09/2016 1832   ALBUMIN 3.4 (L) 06/09/2016 1832   AST 41 06/09/2016 1832   ALT 28 06/09/2016 1832   ALKPHOS 73 06/09/2016 1832   BILITOT 0.4 06/09/2016 1832   GFRNONAA 30 (L) 06/09/2016 1832   GFRNONAA 49 (L) 08/10/2013 1315   GFRAA 35 (L) 06/09/2016 1832   GFRAA 56 (L) 08/10/2013 1315     No results found.     Assessment & Plan:   1. Lower limb ulcer, ankle, left, with unspecified severity (HCC) The patient will be getting compression wraps to use at home.  We will have the patient remain in Unna wraps for 2 more weeks until she can receive her compression wraps.  At that point time we can remove the patient and have her follow-up in 3 months to evaluate her lower extremity edema.  2. Essential hypertension Continue antihypertensive medications as already ordered, these medications have been reviewed and there are no changes at this time.   3. Type 2 diabetes mellitus with other skin ulcer, unspecified whether long term insulin use (HCC) Continue hypoglycemic medications as already  ordered, these medications have been reviewed and there are no changes at this time.  Hgb A1C to be monitored as already arranged by primary service    Current Outpatient Medications on File Prior to Visit  Medication Sig Dispense Refill   Ascorbic Acid 500 MG CHEW Chew by mouth.     aspirin EC 81 MG tablet Take 81 mg by mouth daily.  celecoxib (CELEBREX) 200 MG capsule Take 200 mg by mouth daily.     clopidogrel (PLAVIX) 75 MG tablet Take 1 tablet (75 mg total) by mouth daily. 30 tablet 11   cyanocobalamin 1000 MCG tablet Take by mouth.     furosemide (LASIX) 20 MG tablet Take 20 mg by mouth daily.     glipiZIDE (GLUCOTROL XL) 5 MG 24 hr tablet Take 5 mg by mouth 2 (two) times daily.     lisinopril (ZESTRIL) 10 MG tablet Take 10 mg by mouth daily.     lisinopril (ZESTRIL) 10 MG tablet Take 10 mg by mouth daily.     metFORMIN (GLUCOPHAGE) 1000 MG tablet Take 1,000 mg by mouth 2 (two) times daily with a meal.     methocarbamol (ROBAXIN) 500 MG tablet Take 1 tablet (500 mg total) by mouth 4 (four) times daily. 20 tablet 0   metoprolol succinate (TOPROL-XL) 25 MG 24 hr tablet Take 1 tablet by mouth daily.     mupirocin ointment (BACTROBAN) 2 % Place 1 application on wound daily 22 g 0   ONETOUCH VERIO test strip 1 each 2 (two) times daily.     OZEMPIC, 0.25 OR 0.5 MG/DOSE, 2 MG/1.5ML SOPN Inject into the skin.     pregabalin (LYRICA) 25 MG capsule Take 50 mg by mouth 3 (three) times daily.      rosuvastatin (CRESTOR) 5 MG tablet Take 5 mg by mouth daily.     traMADol (ULTRAM) 50 MG tablet Take 1 tablet (50 mg total) by mouth every 6 (six) hours as needed. 20 tablet 0   ciprofloxacin (CIPRO) 500 MG tablet Take 1 tablet (500 mg total) by mouth 2 (two) times daily. (Patient not taking: Reported on 01/08/2021) 10 tablet 0   liraglutide (VICTOZA) 18 MG/3ML SOPN Inject into the skin. (Patient not taking: Reported on 11/22/2020)     lisinopril-hydrochlorothiazide (PRINZIDE,ZESTORETIC) 20-25 MG per  tablet Take 1 tablet by mouth daily. (Patient not taking: Reported on 11/22/2020)     sulfamethoxazole-trimethoprim (BACTRIM DS) 800-160 MG tablet Take 1 tablet by mouth 2 (two) times daily. (Patient not taking: Reported on 12/13/2020) 20 tablet 0   No current facility-administered medications on file prior to visit.    There are no Patient Instructions on file for this visit. No follow-ups on file.   Kris Hartmann, NP

## 2021-01-27 ENCOUNTER — Ambulatory Visit (INDEPENDENT_AMBULATORY_CARE_PROVIDER_SITE_OTHER): Payer: Medicare HMO | Admitting: Nurse Practitioner

## 2021-01-31 ENCOUNTER — Ambulatory Visit (INDEPENDENT_AMBULATORY_CARE_PROVIDER_SITE_OTHER): Payer: Medicare HMO | Admitting: Nurse Practitioner

## 2021-01-31 ENCOUNTER — Other Ambulatory Visit: Payer: Self-pay

## 2021-01-31 ENCOUNTER — Encounter (INDEPENDENT_AMBULATORY_CARE_PROVIDER_SITE_OTHER): Payer: Self-pay

## 2021-01-31 VITALS — BP 171/67 | HR 69 | Resp 16

## 2021-01-31 DIAGNOSIS — L97211 Non-pressure chronic ulcer of right calf limited to breakdown of skin: Secondary | ICD-10-CM

## 2021-01-31 NOTE — Progress Notes (Signed)
History of Present Illness  There is no documented history at this time  Assessments & Plan   There are no diagnoses linked to this encounter.    Additional instructions  Subjective:  Patient presents with venous ulcer of the Bilateral lower extremity.    Procedure:  3 layer unna wrap was placed Bilateral lower extremity.   Plan:   Follow up in one week.  

## 2021-02-02 ENCOUNTER — Encounter (INDEPENDENT_AMBULATORY_CARE_PROVIDER_SITE_OTHER): Payer: Self-pay | Admitting: Nurse Practitioner

## 2021-02-07 ENCOUNTER — Encounter (INDEPENDENT_AMBULATORY_CARE_PROVIDER_SITE_OTHER): Payer: Medicare HMO

## 2021-02-18 ENCOUNTER — Ambulatory Visit (INDEPENDENT_AMBULATORY_CARE_PROVIDER_SITE_OTHER): Payer: Medicare HMO | Admitting: Nurse Practitioner

## 2021-02-18 ENCOUNTER — Encounter (INDEPENDENT_AMBULATORY_CARE_PROVIDER_SITE_OTHER): Payer: Self-pay | Admitting: Nurse Practitioner

## 2021-02-18 ENCOUNTER — Telehealth (INDEPENDENT_AMBULATORY_CARE_PROVIDER_SITE_OTHER): Payer: Self-pay

## 2021-02-18 VITALS — BP 147/73 | HR 72 | Resp 16

## 2021-02-18 DIAGNOSIS — L97211 Non-pressure chronic ulcer of right calf limited to breakdown of skin: Secondary | ICD-10-CM | POA: Diagnosis not present

## 2021-02-18 MED ORDER — SULFAMETHOXAZOLE-TRIMETHOPRIM 800-160 MG PO TABS: 1.0000 | ORAL_TABLET | Freq: Two times a day (BID) | ORAL | 0 refills | Status: DC

## 2021-02-18 NOTE — Telephone Encounter (Signed)
Patient called in stating she has been speaking closely with [name] about her leg, she said that her leg is now leaking and wanted to know if she can come in for an appointment today.

## 2021-02-18 NOTE — Telephone Encounter (Signed)
Bring patient in today at 2pm

## 2021-02-18 NOTE — Progress Notes (Signed)
History of Present Illness  There is no documented history at this time  Assessments & Plan   There are no diagnoses linked to this encounter.    Additional instructions  Subjective:  Patient presents with venous ulcer of the Bilateral lower extremity.    Procedure:  3 layer unna wrap was placed Bilateral lower extremity.   Plan:   Follow up in one week.  

## 2021-02-26 ENCOUNTER — Encounter (INDEPENDENT_AMBULATORY_CARE_PROVIDER_SITE_OTHER): Payer: Self-pay | Admitting: Nurse Practitioner

## 2021-02-26 ENCOUNTER — Ambulatory Visit (INDEPENDENT_AMBULATORY_CARE_PROVIDER_SITE_OTHER): Payer: Medicare HMO | Admitting: Nurse Practitioner

## 2021-02-26 ENCOUNTER — Other Ambulatory Visit: Payer: Self-pay

## 2021-02-26 VITALS — BP 130/75 | HR 68 | Resp 16

## 2021-02-26 DIAGNOSIS — I890 Lymphedema, not elsewhere classified: Secondary | ICD-10-CM

## 2021-02-26 NOTE — Progress Notes (Signed)
History of Present Illness  There is no documented history at this time  Assessments & Plan   There are no diagnoses linked to this encounter.    Additional instructions  Subjective:  Patient presents with venous ulcer of the Bilateral lower extremity.    Procedure:  3 layer unna wrap was placed Bilateral lower extremity.   Plan:   Follow up in one week.  

## 2021-03-02 ENCOUNTER — Encounter (INDEPENDENT_AMBULATORY_CARE_PROVIDER_SITE_OTHER): Payer: Self-pay | Admitting: Nurse Practitioner

## 2021-03-05 ENCOUNTER — Ambulatory Visit (INDEPENDENT_AMBULATORY_CARE_PROVIDER_SITE_OTHER): Payer: Medicare HMO | Admitting: Nurse Practitioner

## 2021-03-05 ENCOUNTER — Other Ambulatory Visit: Payer: Self-pay

## 2021-03-05 VITALS — BP 201/91 | HR 79 | Resp 17 | Ht 65.0 in | Wt 295.0 lb

## 2021-03-05 DIAGNOSIS — L97211 Non-pressure chronic ulcer of right calf limited to breakdown of skin: Secondary | ICD-10-CM | POA: Diagnosis not present

## 2021-03-05 NOTE — Progress Notes (Signed)
History of Present Illness  There is no documented history at this time  Assessments & Plan   There are no diagnoses linked to this encounter.    Additional instructions  Subjective:  Patient presents with venous ulcer of the Bilateral lower extremity.    Procedure:  3 layer unna wrap was placed Bilateral lower extremity.   Plan:   Follow up in one week.  

## 2021-03-09 ENCOUNTER — Encounter (INDEPENDENT_AMBULATORY_CARE_PROVIDER_SITE_OTHER): Payer: Self-pay | Admitting: Nurse Practitioner

## 2021-03-12 ENCOUNTER — Ambulatory Visit (INDEPENDENT_AMBULATORY_CARE_PROVIDER_SITE_OTHER): Payer: Medicare HMO | Admitting: Nurse Practitioner

## 2021-03-12 ENCOUNTER — Encounter (INDEPENDENT_AMBULATORY_CARE_PROVIDER_SITE_OTHER): Payer: Self-pay

## 2021-03-12 ENCOUNTER — Other Ambulatory Visit: Payer: Self-pay

## 2021-03-12 VITALS — BP 172/71 | HR 71 | Resp 16

## 2021-03-12 DIAGNOSIS — L97211 Non-pressure chronic ulcer of right calf limited to breakdown of skin: Secondary | ICD-10-CM | POA: Diagnosis not present

## 2021-03-12 NOTE — Progress Notes (Signed)
History of Present Illness  There is no documented history at this time  Assessments & Plan   There are no diagnoses linked to this encounter.    Additional instructions  Subjective:  Patient presents with venous ulcer of the Bilateral lower extremity.    Procedure:  3 layer unna wrap was placed Bilateral lower extremity.   Plan:   Follow up in one week.  

## 2021-03-17 ENCOUNTER — Encounter (INDEPENDENT_AMBULATORY_CARE_PROVIDER_SITE_OTHER): Payer: Self-pay | Admitting: Nurse Practitioner

## 2021-03-19 ENCOUNTER — Other Ambulatory Visit: Payer: Self-pay

## 2021-03-19 ENCOUNTER — Encounter (INDEPENDENT_AMBULATORY_CARE_PROVIDER_SITE_OTHER): Payer: Self-pay

## 2021-03-19 ENCOUNTER — Ambulatory Visit (INDEPENDENT_AMBULATORY_CARE_PROVIDER_SITE_OTHER): Payer: Medicare HMO | Admitting: Nurse Practitioner

## 2021-03-19 VITALS — BP 129/67 | HR 69 | Resp 16

## 2021-03-19 DIAGNOSIS — I890 Lymphedema, not elsewhere classified: Secondary | ICD-10-CM | POA: Diagnosis not present

## 2021-03-19 NOTE — Progress Notes (Signed)
History of Present Illness  There is no documented history at this time  Assessments & Plan   There are no diagnoses linked to this encounter.    Additional instructions  Subjective:  Patient presents with venous ulcer of the Bilateral lower extremity.    Procedure:  3 layer unna wrap was placed Bilateral lower extremity.   Plan:   Follow up in one week.  

## 2021-03-23 ENCOUNTER — Encounter (INDEPENDENT_AMBULATORY_CARE_PROVIDER_SITE_OTHER): Payer: Self-pay | Admitting: Nurse Practitioner

## 2021-03-26 ENCOUNTER — Ambulatory Visit (INDEPENDENT_AMBULATORY_CARE_PROVIDER_SITE_OTHER): Payer: Medicare HMO | Admitting: Nurse Practitioner

## 2021-03-26 ENCOUNTER — Other Ambulatory Visit: Payer: Self-pay

## 2021-03-26 ENCOUNTER — Encounter (INDEPENDENT_AMBULATORY_CARE_PROVIDER_SITE_OTHER): Payer: Self-pay | Admitting: Nurse Practitioner

## 2021-03-26 VITALS — BP 137/82 | HR 72 | Resp 16

## 2021-03-26 DIAGNOSIS — I890 Lymphedema, not elsewhere classified: Secondary | ICD-10-CM | POA: Diagnosis not present

## 2021-03-26 DIAGNOSIS — I10 Essential (primary) hypertension: Secondary | ICD-10-CM

## 2021-03-31 ENCOUNTER — Encounter (INDEPENDENT_AMBULATORY_CARE_PROVIDER_SITE_OTHER): Payer: Self-pay | Admitting: Nurse Practitioner

## 2021-03-31 NOTE — Progress Notes (Signed)
Subjective:    Patient ID: [name], female    DOB: 1945/08/05, 76 y.o.   MRN: [id number] Chief Complaint  Patient presents with   Follow-up    4 week unna check    The patient returns to the office for followup evaluation regarding leg swelling.  The swelling has improved quite a bit and the pain associated with swelling has decreased substantially. There have not been any interval development of a ulcerations or wounds.  Since the previous visit the patient has been wearing Unna boots and has noted little significant improvement in the lymphedema. The patient has been using compression routinely morning until night.  The patient also states elevation during the day and exercise is being done too.        Review of Systems  Cardiovascular:  Positive for leg swelling.  All other systems reviewed and are negative.     Objective:   Physical Exam Vitals reviewed.  HENT:     Head: Normocephalic.  Cardiovascular:     Rate and Rhythm: Normal rate.     Pulses: Normal pulses.  Pulmonary:     Effort: Pulmonary effort is normal.  Musculoskeletal:     Right lower leg: Edema present.     Left lower leg: Edema present.  Skin:    General: Skin is warm and dry.  Neurological:     Mental Status: She is alert and oriented to person, place, and time.  Psychiatric:        Mood and Affect: Mood normal.        Behavior: Behavior normal.        Thought Content: Thought content normal.        Judgment: Judgment normal.    BP 137/82 (BP Location: Right Arm)    Pulse 72    Resp 16   Past Medical History:  Diagnosis Date   Anemia    as young child   Arthritis    back, leg and foot   Blood transfusion    1964   Carpal tunnel syndrome    Diabetes mellitus    oral medications   Hypertension     Social History   Socioeconomic History   Marital status: Married    Spouse name: Not on file   Number of children: Not on file   Years of education: Not on file   Highest  education level: Not on file  Occupational History   Not on file  Tobacco Use   Smoking status: Never   Smokeless tobacco: Never  Substance and Sexual Activity   Alcohol use: No   Drug use: No   Sexual activity: Not on file  Other Topics Concern   Not on file  Social History Narrative   Not on file   Social Determinants of Health   Financial Resource Strain: Not on file  Food Insecurity: Not on file  Transportation Needs: Not on file  Physical Activity: Not on file  Stress: Not on file  Social Connections: Not on file  Intimate Partner Violence: Not on file    Past Surgical History:  Procedure Laterality Date   ABDOMINAL HYSTERECTOMY     ANTERIOR CERVICAL DECOMP/DISCECTOMY FUSION  04/27/2011   Procedure: ANTERIOR CERVICAL DECOMPRESSION/DISCECTOMY FUSION 2 LEVELS;  Surgeon: Elaina Hoops, MD;  Location: [facility];  Service: Neurosurgery;  Laterality: N/A;  Anterior Cervical Decompression/ Discectomy Fusion Cervical Three-Four, Cervical Four-Five   APPENDECTOMY     KNEE ARTHROPLASTY  right   PERIPHERAL VASCULAR CATHETERIZATION Left 10/29/2014   Procedure: Lower Extremity Angiography;  Surgeon: Algernon Huxley, MD;  Location: [facility];  Service: Cardiovascular;  Laterality: Left;   PERIPHERAL VASCULAR CATHETERIZATION Left 10/29/2014   Procedure: Lower Extremity Intervention;  Surgeon: Algernon Huxley, MD;  Location: [facility];  Service: Cardiovascular;  Laterality: Left;   PERIPHERAL VASCULAR CATHETERIZATION Left 03/11/2015   Procedure: Lower Extremity Angiography;  Surgeon: Algernon Huxley, MD;  Location: [facility];  Service: Cardiovascular;  Laterality: Left;   PERIPHERAL VASCULAR CATHETERIZATION Left 03/11/2015   Procedure: Lower Extremity Intervention;  Surgeon: Algernon Huxley, MD;  Location: [facility];  Service: Cardiovascular;  Laterality: Left;    Family History  Problem Relation Age of Onset   Cancer Mother    Hypertension Mother     Diabetes Paternal Grandmother     Allergies  Allergen Reactions   Penicillins Shortness Of Breath and Swelling   Cheese Itching   Codeine Other (See Comments)    Ringing in ear TOLERATES Tramadol Other reaction(s): Other (See Comments) RINGING IN THE EARS Ringing in ear TOLERATES Tramadol   Strawberry (Diagnostic) Hives   Strawberry Extract Hives    CBC Latest Ref Rng & Units 06/09/2016 08/10/2013 04/22/2011  WBC 3.6 - 11.0 K/uL 2.3(L) 4.0 8.2  Hemoglobin 12.0 - 16.0 g/dL 11.8(L) 11.5(L) 12.4  Hematocrit 35.0 - 47.0 % 35.9 36.1 37.0  Platelets 150 - 440 K/uL 151 196 228      CMP     Component Value Date/Time   NA 139 06/09/2016 1832   NA 139 08/10/2013 1315   K 3.4 (L) 06/09/2016 1832   K 3.7 08/10/2013 1315   CL 100 (L) 06/09/2016 1832   CL 105 08/10/2013 1315   CO2 29 06/09/2016 1832   CO2 28 08/10/2013 1315   GLUCOSE 295 (H) 06/09/2016 1832   GLUCOSE 310 (H) 08/10/2013 1315   BUN 27 (H) 06/09/2016 1832   BUN 15 08/10/2013 1315   CREATININE 1.65 (H) 06/09/2016 1832   CREATININE 1.15 08/10/2013 1315   CALCIUM 8.3 (L) 06/09/2016 1832   CALCIUM 8.8 08/10/2013 1315   PROT 6.8 06/09/2016 1832   ALBUMIN 3.4 (L) 06/09/2016 1832   AST 41 06/09/2016 1832   ALT 28 06/09/2016 1832   ALKPHOS 73 06/09/2016 1832   BILITOT 0.4 06/09/2016 1832   GFRNONAA 30 (L) 06/09/2016 1832   GFRNONAA 49 (L) 08/10/2013 1315   GFRAA 35 (L) 06/09/2016 1832   GFRAA 56 (L) 08/10/2013 1315     No results found.     Assessment & Plan:   1. Lymphedema The patient swelling is doing well.  She will come out of the Unna wraps and transition back into her medical grade compression wraps.  She will also continue to elevate her lower extremities as well as engage in activity as tolerated.  We will have her return in 3 months or sooner if issues arise.  2. Benign essential hypertension Continue antihypertensive medications as already ordered, these medications have been reviewed and there are no  changes at this time.    Current Outpatient Medications on File Prior to Visit  Medication Sig Dispense Refill   Ascorbic Acid 500 MG CHEW Chew by mouth.     aspirin EC 81 MG tablet Take 81 mg by mouth daily.     celecoxib (CELEBREX) 200 MG capsule Take 200 mg by mouth daily.     ciprofloxacin (CIPRO)  500 MG tablet Take 1 tablet (500 mg total) by mouth 2 (two) times daily. (Patient not taking: Reported on 01/08/2021) 10 tablet 0   clopidogrel (PLAVIX) 75 MG tablet Take 1 tablet (75 mg total) by mouth daily. 30 tablet 11   cyanocobalamin 1000 MCG tablet Take by mouth.     furosemide (LASIX) 20 MG tablet Take 20 mg by mouth daily.     glipiZIDE (GLUCOTROL XL) 5 MG 24 hr tablet Take 5 mg by mouth 2 (two) times daily.     liraglutide (VICTOZA) 18 MG/3ML SOPN Inject into the skin. (Patient not taking: Reported on 11/22/2020)     lisinopril (ZESTRIL) 10 MG tablet Take 10 mg by mouth daily.     lisinopril (ZESTRIL) 10 MG tablet Take 10 mg by mouth daily.     lisinopril-hydrochlorothiazide (PRINZIDE,ZESTORETIC) 20-25 MG per tablet Take 1 tablet by mouth daily. (Patient not taking: Reported on 11/22/2020)     metFORMIN (GLUCOPHAGE) 1000 MG tablet Take 1,000 mg by mouth 2 (two) times daily with a meal.     methocarbamol (ROBAXIN) 500 MG tablet Take 1 tablet (500 mg total) by mouth 4 (four) times daily. 20 tablet 0   metoprolol succinate (TOPROL-XL) 25 MG 24 hr tablet Take 1 tablet by mouth daily.     mupirocin ointment (BACTROBAN) 2 % Place 1 application on wound daily 22 g 0   ONETOUCH VERIO test strip 1 each 2 (two) times daily.     OZEMPIC, 0.25 OR 0.5 MG/DOSE, 2 MG/1.5ML SOPN Inject into the skin.     pregabalin (LYRICA) 25 MG capsule Take 50 mg by mouth 3 (three) times daily.      rosuvastatin (CRESTOR) 5 MG tablet Take 5 mg by mouth daily.     sulfamethoxazole-trimethoprim (BACTRIM DS) 800-160 MG tablet Take 1 tablet by mouth 2 (two) times daily. (Patient not taking: Reported on 03/19/2021) 20  tablet 0   traMADol (ULTRAM) 50 MG tablet Take 1 tablet (50 mg total) by mouth every 6 (six) hours as needed. 20 tablet 0   No current facility-administered medications on file prior to visit.    There are no Patient Instructions on file for this visit. No follow-ups on file.   Kris Hartmann, NP

## 2021-04-25 ENCOUNTER — Ambulatory Visit (INDEPENDENT_AMBULATORY_CARE_PROVIDER_SITE_OTHER): Payer: Medicare HMO | Admitting: Nurse Practitioner

## 2021-04-25 ENCOUNTER — Encounter (INDEPENDENT_AMBULATORY_CARE_PROVIDER_SITE_OTHER): Payer: Self-pay | Admitting: Nurse Practitioner

## 2021-04-25 ENCOUNTER — Other Ambulatory Visit: Payer: Self-pay

## 2021-04-25 VITALS — BP 146/67 | HR 77 | Resp 16

## 2021-04-25 DIAGNOSIS — I10 Essential (primary) hypertension: Secondary | ICD-10-CM | POA: Diagnosis not present

## 2021-04-25 DIAGNOSIS — I890 Lymphedema, not elsewhere classified: Secondary | ICD-10-CM | POA: Diagnosis not present

## 2021-05-04 ENCOUNTER — Encounter (INDEPENDENT_AMBULATORY_CARE_PROVIDER_SITE_OTHER): Payer: Self-pay | Admitting: Nurse Practitioner

## 2021-05-04 NOTE — Progress Notes (Signed)
? ?Subjective:  ? ? Patient ID: [name], female    DOB: Jul 12, 1945, 76 y.o.   MRN: [id number] ?Chief Complaint  ?Patient presents with  ? Follow-up  ?  3 month follow up  ? ? ?Patient presents today for follow-up evaluation of her lymphedema.  The patient notes that she has been wearing her compression wraps daily and has been keeping her legs under good control.  She does not note any pain discomfort or swelling.  Overall she notes that her legs are doing well. ? ? ?Review of Systems  ?Cardiovascular:  Positive for leg swelling.  ?Neurological:  Positive for weakness.  ?All other systems reviewed and are negative. ? ?   ?Objective:  ? Physical Exam ?Vitals reviewed.  ?Cardiovascular:  ?   Rate and Rhythm: Normal rate.  ?Pulmonary:  ?   Effort: Pulmonary effort is normal.  ?Musculoskeletal:  ?   Right lower leg: Edema present.  ?   Left lower leg: Edema present.  ?Neurological:  ?   Mental Status: She is alert and oriented to person, place, and time.  ?   Motor: Weakness present.  ?   Gait: Gait abnormal.  ?Psychiatric:     ?   Mood and Affect: Mood normal.     ?   Behavior: Behavior normal.     ?   Thought Content: Thought content normal.     ?   Judgment: Judgment normal.  ? ? ?BP (!) 146/67 (BP Location: Left Arm)   Pulse 77   Resp 16  ? ?Past Medical History:  ?Diagnosis Date  ? Anemia   ? as young child  ? Arthritis   ? back, leg and foot  ? Blood transfusion   ? 1964  ? Carpal tunnel syndrome   ? Diabetes mellitus   ? oral medications  ? Hypertension   ? ? ?Social History  ? ?Socioeconomic History  ? Marital status: Married  ?  Spouse name: Not on file  ? Number of children: Not on file  ? Years of education: Not on file  ? Highest education level: Not on file  ?Occupational History  ? Not on file  ?Tobacco Use  ? Smoking status: Never  ? Smokeless tobacco: Never  ?Substance and Sexual Activity  ? Alcohol use: No  ? Drug use: No  ? Sexual activity: Not on file  ?Other Topics Concern  ? Not on file   ?Social History Narrative  ? Not on file  ? ?Social Determinants of Health  ? ?Financial Resource Strain: Not on file  ?Food Insecurity: Not on file  ?Transportation Needs: Not on file  ?Physical Activity: Not on file  ?Stress: Not on file  ?Social Connections: Not on file  ?Intimate Partner Violence: Not on file  ? ? ?Past Surgical History:  ?Procedure Laterality Date  ? ABDOMINAL HYSTERECTOMY    ? ANTERIOR CERVICAL DECOMP/DISCECTOMY FUSION  04/27/2011  ? Procedure: ANTERIOR CERVICAL DECOMPRESSION/DISCECTOMY FUSION 2 LEVELS;  Surgeon: Mariam DollarGary P Cram, MD;  Location: [facility];  Service: Neurosurgery;  Laterality: N/A;  Anterior Cervical Decompression/ Discectomy Fusion Cervical Three-Four, Cervical Four-Five  ? APPENDECTOMY    ? KNEE ARTHROPLASTY    ? right  ? PERIPHERAL VASCULAR CATHETERIZATION Left 10/29/2014  ? Procedure: Lower Extremity Angiography;  Surgeon: Annice NeedyJason S Dew, MD;  Location: [facility];  Service: Cardiovascular;  Laterality: Left;  ? PERIPHERAL VASCULAR CATHETERIZATION Left 10/29/2014  ? Procedure: Lower Extremity Intervention;  Surgeon: Barbara CowerJason  Driscilla Grammes, MD;  Location: [facility];  Service: Cardiovascular;  Laterality: Left;  ? PERIPHERAL VASCULAR CATHETERIZATION Left 03/11/2015  ? Procedure: Lower Extremity Angiography;  Surgeon: Annice Needy, MD;  Location: [facility];  Service: Cardiovascular;  Laterality: Left;  ? PERIPHERAL VASCULAR CATHETERIZATION Left 03/11/2015  ? Procedure: Lower Extremity Intervention;  Surgeon: Annice Needy, MD;  Location: [facility];  Service: Cardiovascular;  Laterality: Left;  ? ? ?Family History  ?Problem Relation Age of Onset  ? Cancer Mother   ? Hypertension Mother   ? Diabetes Paternal Grandmother   ? ? ?Allergies  ?Allergen Reactions  ? Penicillins Shortness Of Breath and Swelling  ? Cheese Itching  ? Codeine Other (See Comments)  ?  Ringing in ear TOLERATES Tramadol ?Other reaction(s): Other (See Comments) ?RINGING IN THE  EARS ?Ringing in ear TOLERATES Tramadol  ? Strawberry (Diagnostic) Hives  ? Strawberry Extract Hives  ? ? ?CBC Latest Ref Rng & Units 06/09/2016 08/10/2013 04/22/2011  ?WBC 3.6 - 11.0 K/uL 2.3(L) 4.0 8.2  ?Hemoglobin 12.0 - 16.0 g/dL 11.8(L) 11.5(L) 12.4  ?Hematocrit 35.0 - 47.0 % 35.9 36.1 37.0  ?Platelets 150 - 440 K/uL 151 196 228  ? ? ? ? ?CMP  ?   ?Component Value Date/Time  ? NA 139 06/09/2016 1832  ? NA 139 08/10/2013 1315  ? K 3.4 (L) 06/09/2016 1832  ? K 3.7 08/10/2013 1315  ? CL 100 (L) 06/09/2016 1832  ? CL 105 08/10/2013 1315  ? CO2 29 06/09/2016 1832  ? CO2 28 08/10/2013 1315  ? GLUCOSE 295 (H) 06/09/2016 1832  ? GLUCOSE 310 (H) 08/10/2013 1315  ? BUN 27 (H) 06/09/2016 1832  ? BUN 15 08/10/2013 1315  ? CREATININE 1.65 (H) 06/09/2016 1832  ? CREATININE 1.15 08/10/2013 1315  ? CALCIUM 8.3 (L) 06/09/2016 1832  ? CALCIUM 8.8 08/10/2013 1315  ? PROT 6.8 06/09/2016 1832  ? ALBUMIN 3.4 (L) 06/09/2016 1832  ? AST 41 06/09/2016 1832  ? ALT 28 06/09/2016 1832  ? ALKPHOS 73 06/09/2016 1832  ? BILITOT 0.4 06/09/2016 1832  ? GFRNONAA 30 (L) 06/09/2016 1832  ? GFRNONAA 49 (L) 08/10/2013 1315  ? GFRAA 35 (L) 06/09/2016 1832  ? GFRAA 56 (L) 08/10/2013 1315  ? ? ? ?No results found. ? ?   ?Assessment & Plan:  ? ?1. Lymphedema ?The patient has been wearing her compression wraps diligently and the swelling is under good control.  There are no open wounds or ulcerations.  Patient will continue with conservative therapy including compression wraps, elevation and activity as tolerated.  We will see the patient back in 3 months or sooner if issues arise. ? ?2. Essential hypertension ?Continue antihypertensive medications as already ordered, these medications have been reviewed and there are no changes at this time.  ? ? ?Current Outpatient Medications on File Prior to Visit  ?Medication Sig Dispense Refill  ? Ascorbic Acid 500 MG CHEW Chew by mouth.    ? aspirin EC 81 MG tablet Take 81 mg by mouth daily.    ? celecoxib (CELEBREX)  200 MG capsule Take 200 mg by mouth daily.    ? clopidogrel (PLAVIX) 75 MG tablet Take 1 tablet (75 mg total) by mouth daily. 30 tablet 11  ? cyanocobalamin 1000 MCG tablet Take by mouth.    ? furosemide (LASIX) 20 MG tablet Take 20 mg by mouth daily.    ? glipiZIDE (GLUCOTROL XL) 5 MG 24 hr tablet  Take 5 mg by mouth 2 (two) times daily.    ? lisinopril (ZESTRIL) 10 MG tablet Take 10 mg by mouth daily.    ? lisinopril (ZESTRIL) 10 MG tablet Take 10 mg by mouth daily.    ? metFORMIN (GLUCOPHAGE) 1000 MG tablet Take 1,000 mg by mouth 2 (two) times daily with a meal.    ? methocarbamol (ROBAXIN) 500 MG tablet Take 1 tablet (500 mg total) by mouth 4 (four) times daily. 20 tablet 0  ? metoprolol succinate (TOPROL-XL) 25 MG 24 hr tablet Take 1 tablet by mouth daily.    ? mupirocin ointment (BACTROBAN) 2 % Place 1 application on wound daily 22 g 0  ? ONETOUCH VERIO test strip 1 each 2 (two) times daily.    ? OZEMPIC, 0.25 OR 0.5 MG/DOSE, 2 MG/1.5ML SOPN Inject into the skin.    ? pregabalin (LYRICA) 25 MG capsule Take 50 mg by mouth 3 (three) times daily.     ? rosuvastatin (CRESTOR) 5 MG tablet Take 5 mg by mouth daily.    ? traMADol (ULTRAM) 50 MG tablet Take 1 tablet (50 mg total) by mouth every 6 (six) hours as needed. 20 tablet 0  ? ciprofloxacin (CIPRO) 500 MG tablet Take 1 tablet (500 mg total) by mouth 2 (two) times daily. (Patient not taking: Reported on 01/08/2021) 10 tablet 0  ? liraglutide (VICTOZA) 18 MG/3ML SOPN Inject into the skin. (Patient not taking: Reported on 11/22/2020)    ? lisinopril-hydrochlorothiazide (PRINZIDE,ZESTORETIC) 20-25 MG per tablet Take 1 tablet by mouth daily. (Patient not taking: Reported on 11/22/2020)    ? sulfamethoxazole-trimethoprim (BACTRIM DS) 800-160 MG tablet Take 1 tablet by mouth 2 (two) times daily. (Patient not taking: Reported on 03/19/2021) 20 tablet 0  ? ?No current facility-administered medications on file prior to visit.  ? ? ?There are no Patient Instructions on file  for this visit. ?No follow-ups on file. ? ? ?Georgiana Spinner, NP ? ? ?

## 2021-05-07 ENCOUNTER — Ambulatory Visit (INDEPENDENT_AMBULATORY_CARE_PROVIDER_SITE_OTHER): Payer: Medicare HMO | Admitting: Nurse Practitioner

## 2021-06-18 IMAGING — CR DG HAND COMPLETE 3+V*R*
3 series · 3 of 3 positions shown · non-contrast
Comparison: None.

CLINICAL DATA: Pain

EXAM:
RIGHT HAND - COMPLETE 3+ VIEW

[hand ap]
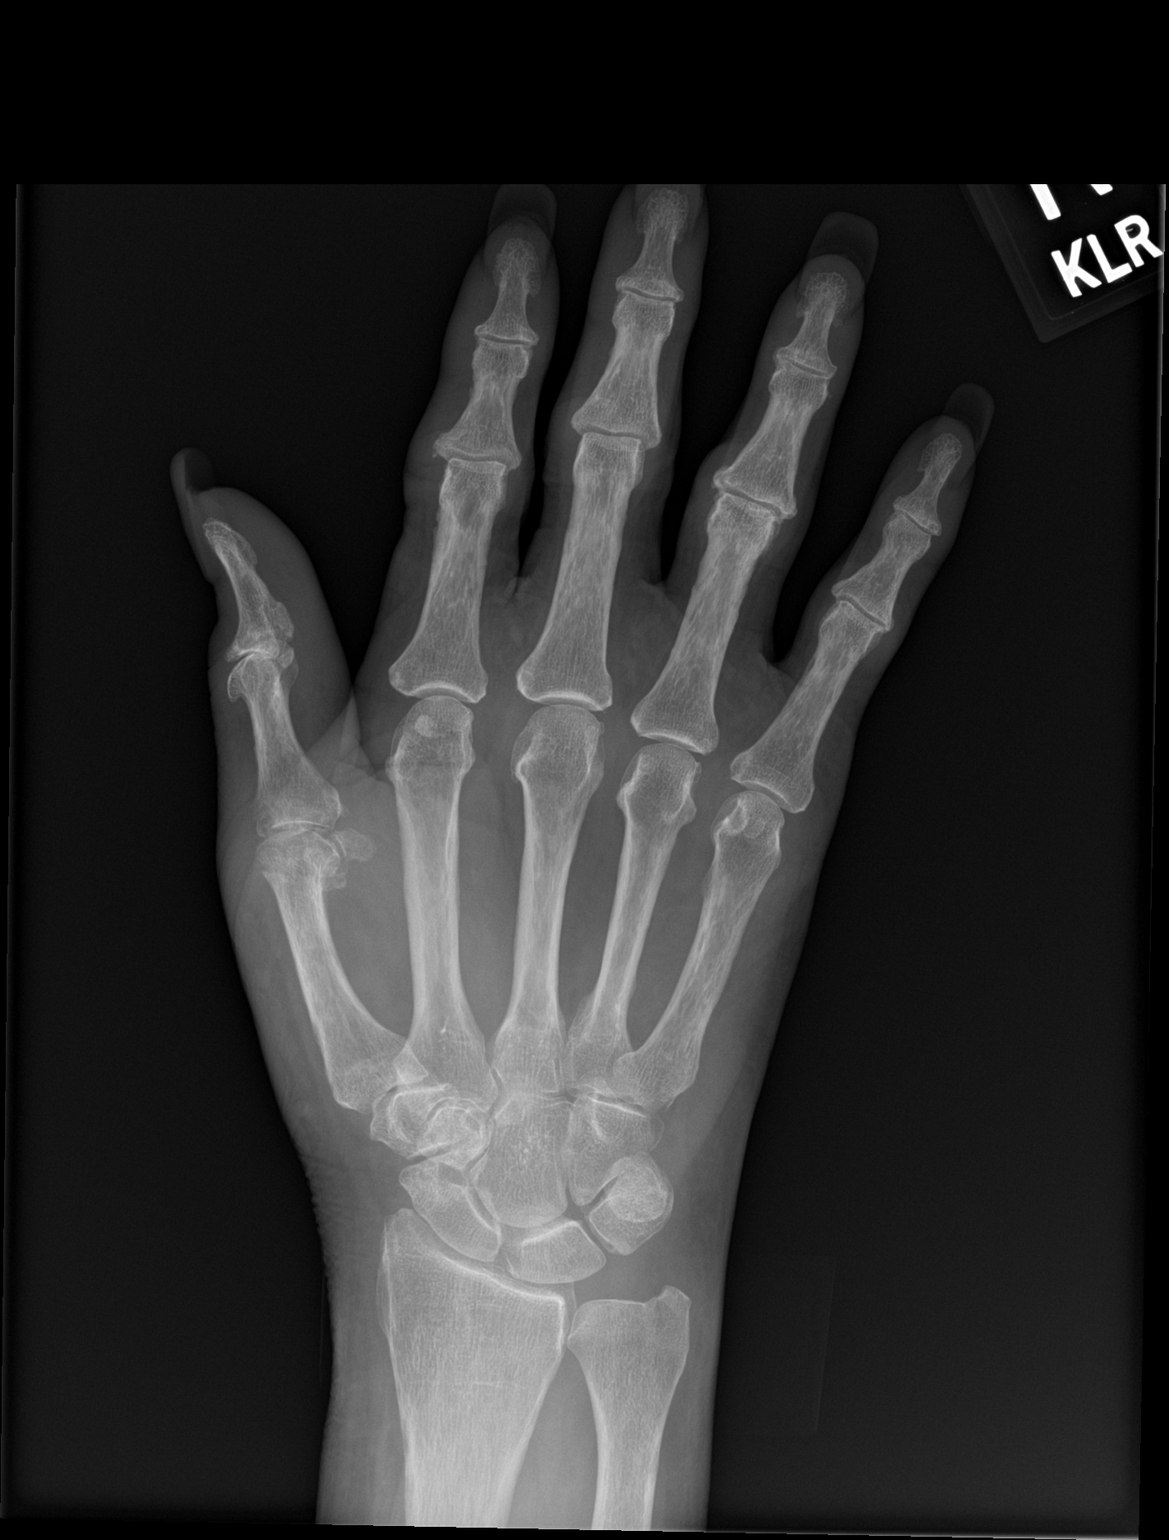

[hand obl]
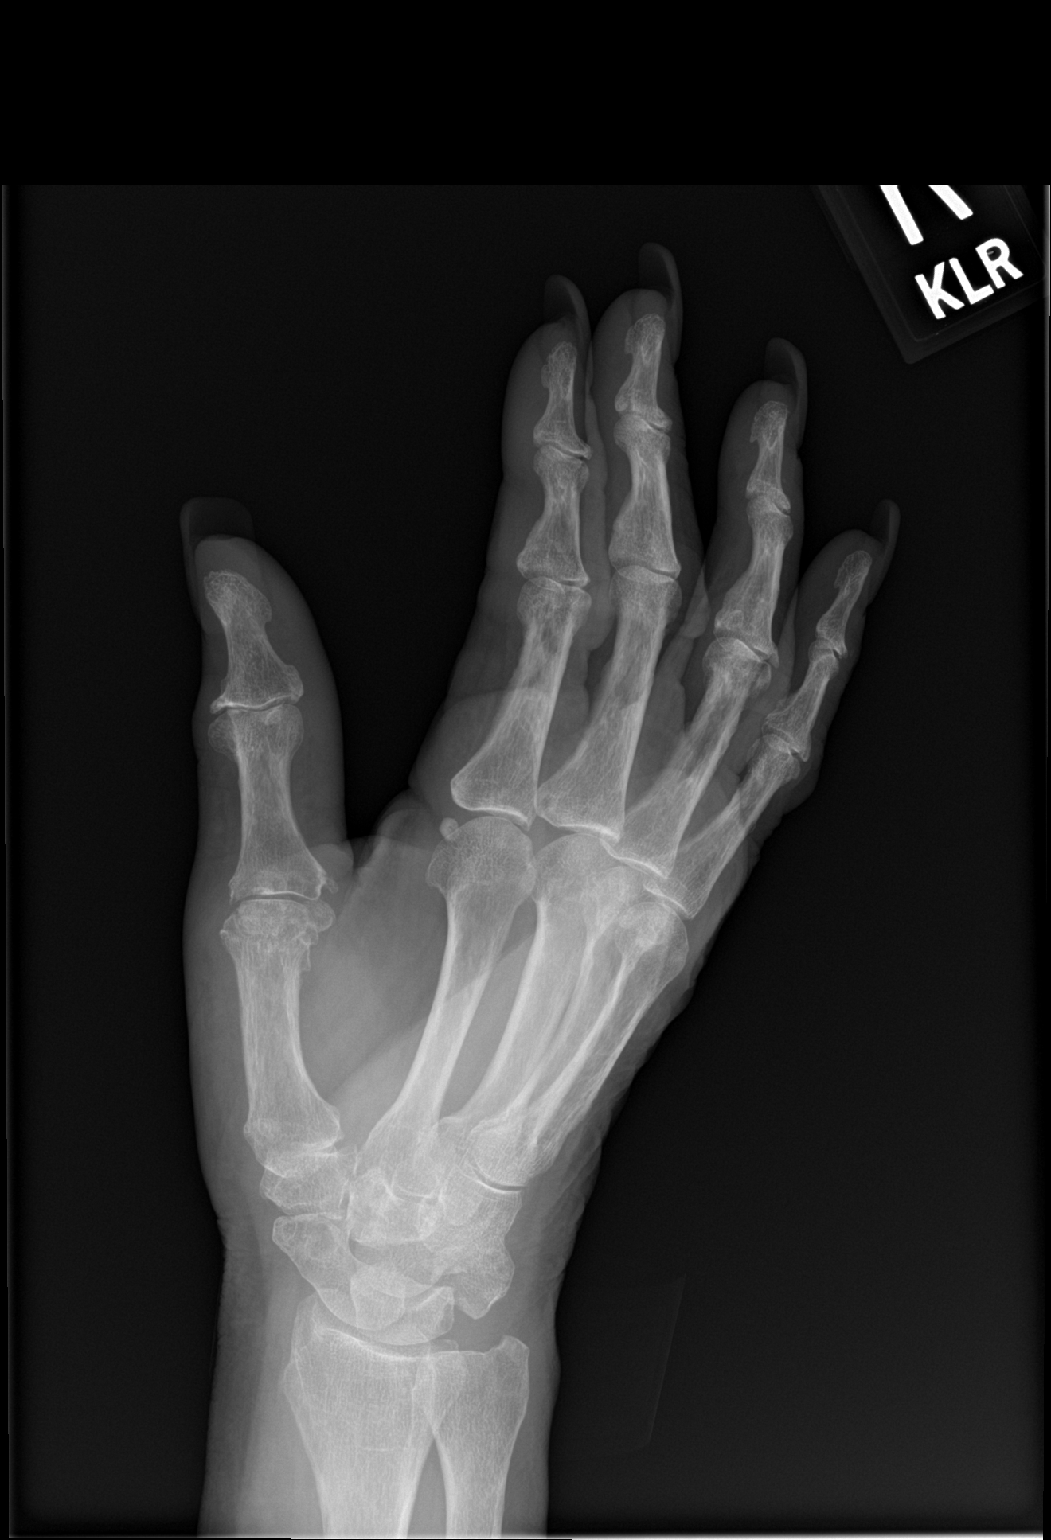

[hand lat]
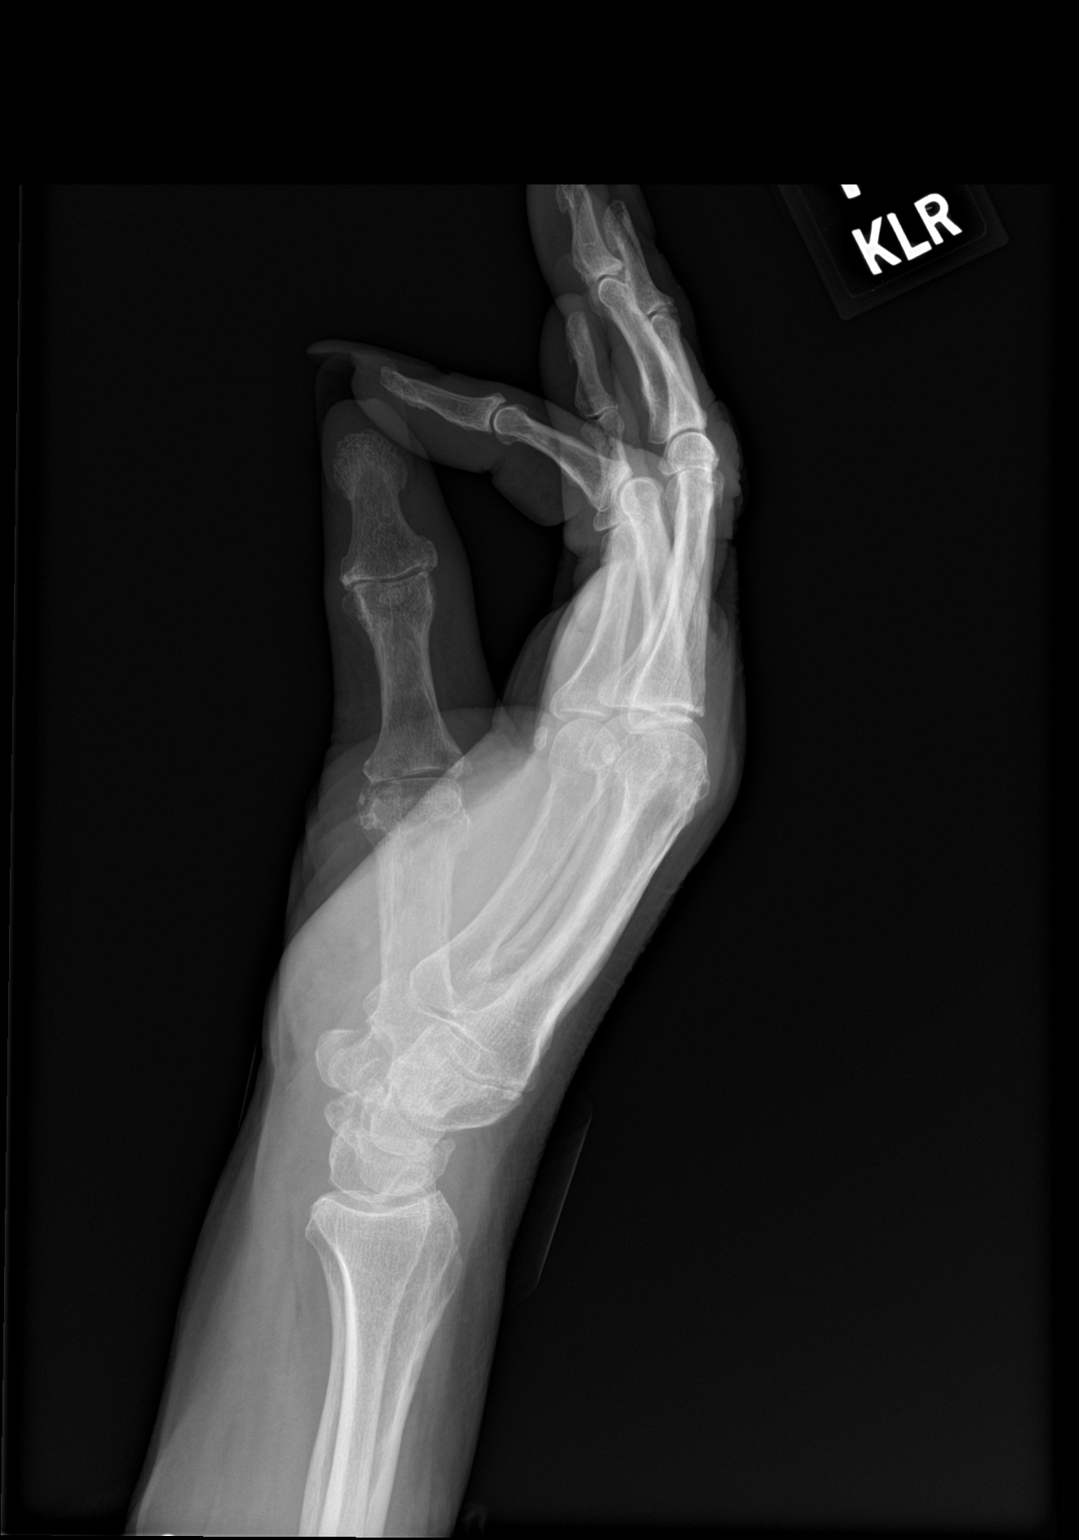

[3 of 3 positions shown; findings below may reference images not displayed]

FINDINGS: Frontal, oblique, and lateral views were obtained. No evident
fracture or dislocation. There is osteoarthritic change in the first
IP joint as well as in all PIP and DIP joints. There is also
osteoarthritic change in the first MCP joint. In the wrist region,
there is osteoarthritic change in the first carpal-metacarpal and
scaphotrapezial joints. No erosions.
IMPRESSION: Multilevel osteoarthritic change.  No fracture or dislocation.

## 2021-06-18 IMAGING — CR DG SHOULDER 2+V*R*
3 series · 3 of 3 positions shown · non-contrast
Comparison: None.

CLINICAL DATA: Right shoulder pain for 2 months

EXAM:
RIGHT SHOULDER - 2+ VIEW

[shoulder grashey (1 of 2)]
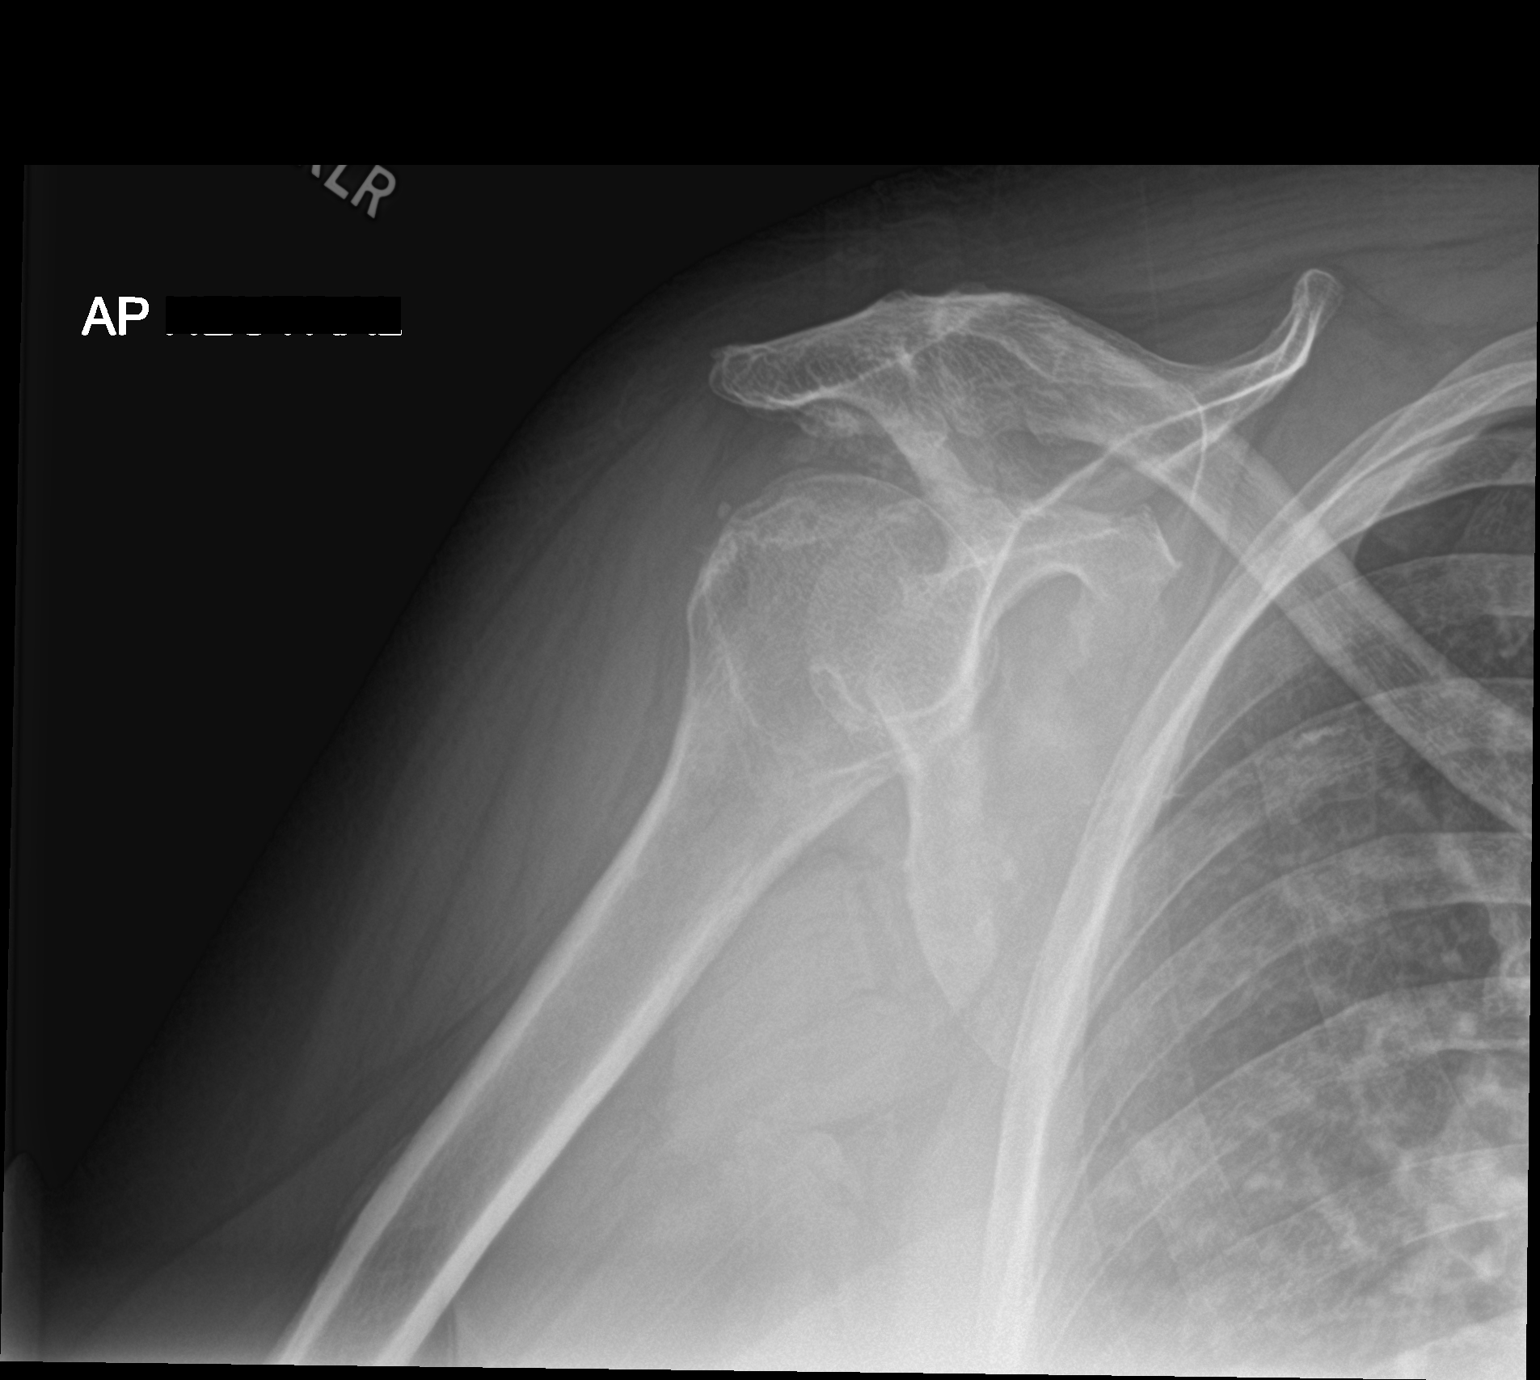

[shoulder y view]
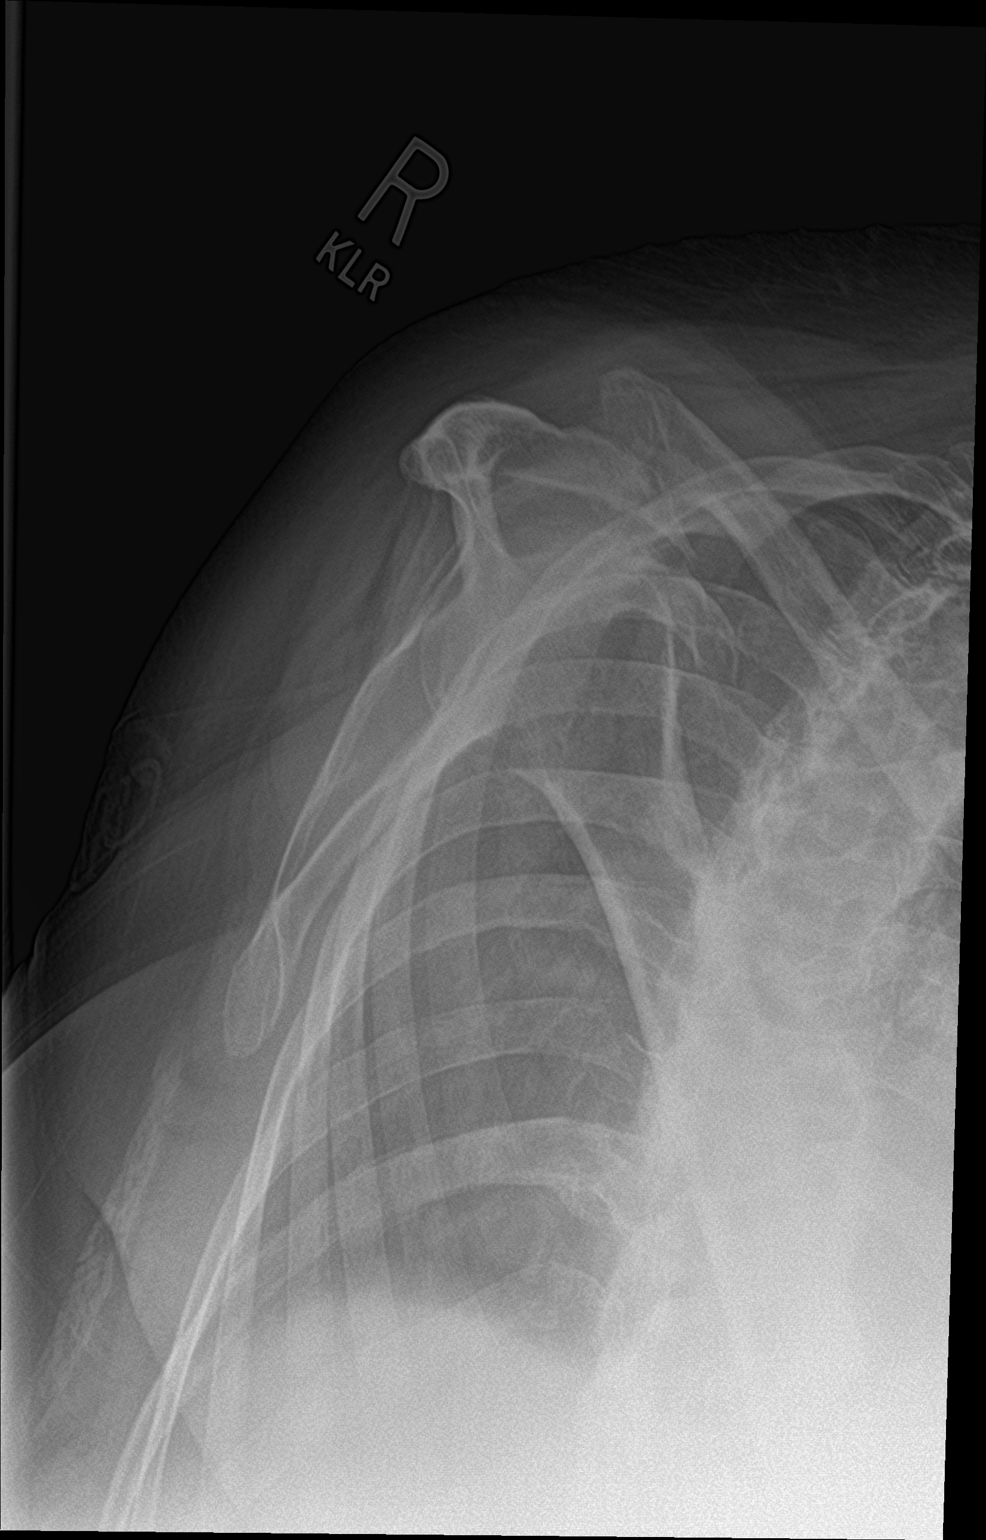

[shoulder grashey (2 of 2)]
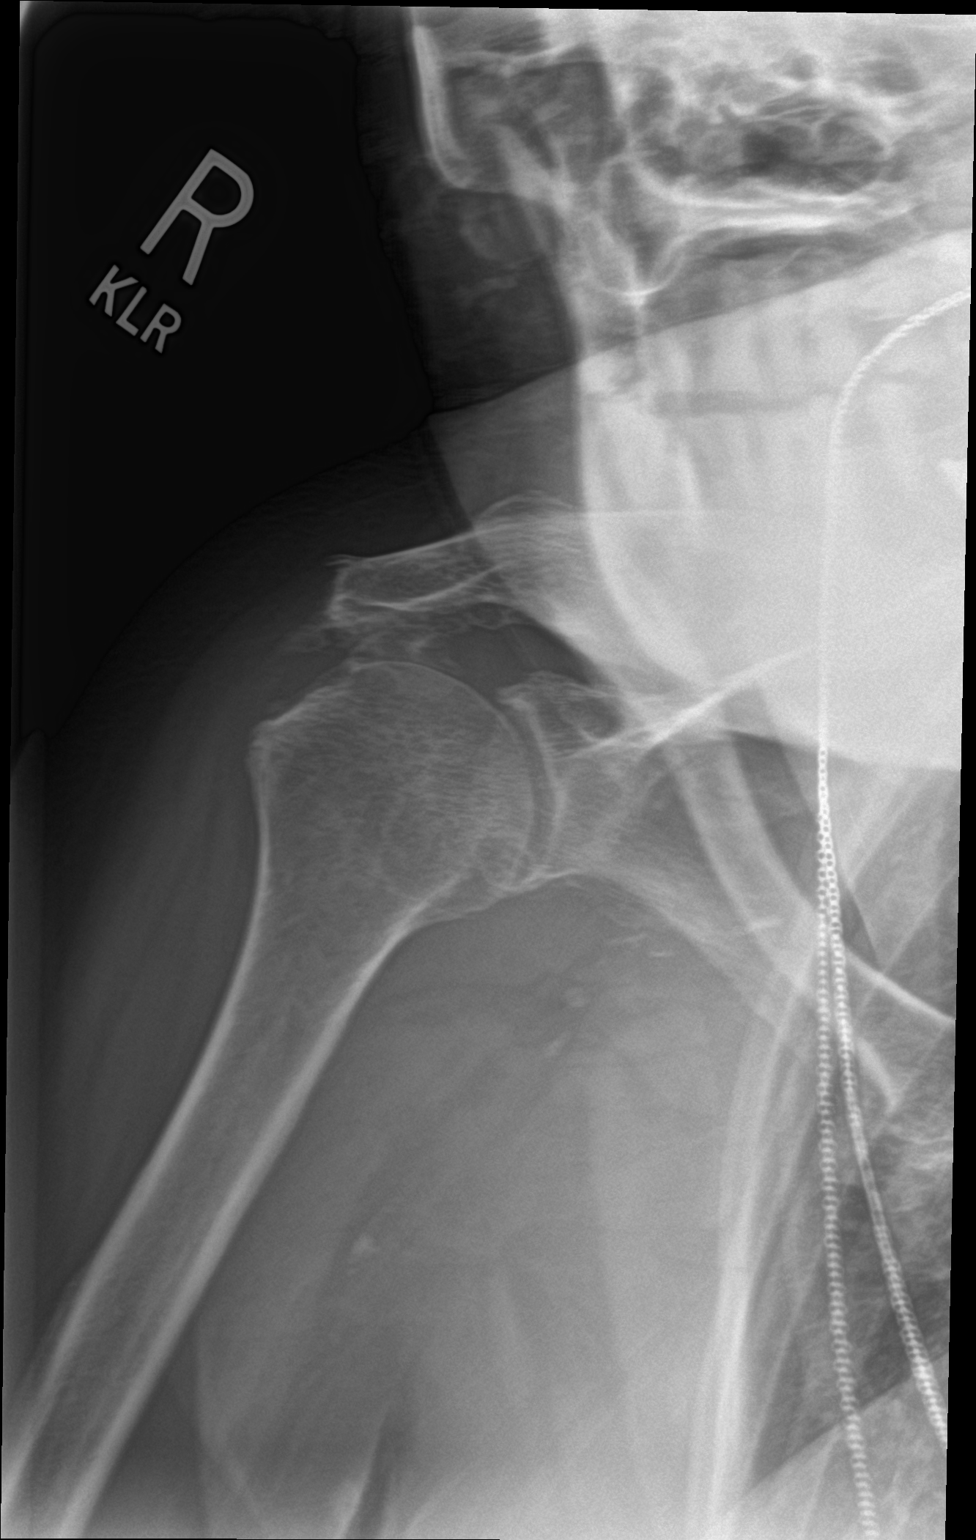

[3 of 3 positions shown; findings below may reference images not displayed]

FINDINGS: Internal rotation, external rotation, and transscapular views of the
right shoulder are obtained. No fracture, subluxation, or
dislocation. Significant hypertrophic changes are seen at the
acromioclavicular joint, with moderate spurring of the undersurface
of the acromion process. The there is significant glenohumeral
osteoarthritis with joint space narrowing and osteophyte formation.
Narrowing of the acromial humeral interval suggests chronic
longstanding rotator cuff tear. Calcifications within the superior
margin of the rotator cuff consistent with calcific tendinopathy.
IMPRESSION: 1. Extensive degenerative changes as above.  No acute fracture.

## 2021-07-30 ENCOUNTER — Encounter (INDEPENDENT_AMBULATORY_CARE_PROVIDER_SITE_OTHER): Payer: Self-pay | Admitting: Nurse Practitioner

## 2021-07-30 ENCOUNTER — Ambulatory Visit (INDEPENDENT_AMBULATORY_CARE_PROVIDER_SITE_OTHER): Payer: Medicare HMO | Admitting: Nurse Practitioner

## 2021-07-30 VITALS — BP 139/74 | HR 69 | Resp 16

## 2021-07-30 DIAGNOSIS — E11622 Type 2 diabetes mellitus with other skin ulcer: Secondary | ICD-10-CM | POA: Diagnosis not present

## 2021-07-30 DIAGNOSIS — I10 Essential (primary) hypertension: Secondary | ICD-10-CM

## 2021-07-30 DIAGNOSIS — L98499 Non-pressure chronic ulcer of skin of other sites with unspecified severity: Secondary | ICD-10-CM

## 2021-07-30 DIAGNOSIS — I890 Lymphedema, not elsewhere classified: Secondary | ICD-10-CM

## 2021-08-10 ENCOUNTER — Encounter (INDEPENDENT_AMBULATORY_CARE_PROVIDER_SITE_OTHER): Payer: Self-pay | Admitting: Nurse Practitioner

## 2021-10-21 ENCOUNTER — Ambulatory Visit (INDEPENDENT_AMBULATORY_CARE_PROVIDER_SITE_OTHER): Payer: Medicare HMO | Admitting: Nurse Practitioner

## 2021-10-21 ENCOUNTER — Telehealth (INDEPENDENT_AMBULATORY_CARE_PROVIDER_SITE_OTHER): Payer: Self-pay

## 2021-10-21 ENCOUNTER — Encounter (INDEPENDENT_AMBULATORY_CARE_PROVIDER_SITE_OTHER): Payer: Self-pay

## 2021-10-21 VITALS — BP 117/66 | HR 73 | Resp 16 | Wt 281.6 lb

## 2021-10-21 DIAGNOSIS — I890 Lymphedema, not elsewhere classified: Secondary | ICD-10-CM | POA: Diagnosis not present

## 2021-10-21 NOTE — Progress Notes (Signed)
History of Present Illness  There is no documented history at this time  Assessments & Plan   There are no diagnoses linked to this encounter.    Additional instructions  Subjective:  Patient presents with venous ulcer of the Right lower extremity.    Procedure:  3 layer unna wrap was placed Right lower extremity.   Plan:   Follow up in one week.   

## 2021-10-21 NOTE — Telephone Encounter (Signed)
Pt calls stating that her leg is wheeping into her shoe wants to see about getting antibiotics.  Sheppard Plumber NP said send in Doxy 100mg  BID 10 days  #20.  Called pt and made her aware Rx has been called into . She states understanding.

## 2021-10-24 ENCOUNTER — Encounter (INDEPENDENT_AMBULATORY_CARE_PROVIDER_SITE_OTHER): Payer: Self-pay | Admitting: Nurse Practitioner

## 2021-10-28 ENCOUNTER — Other Ambulatory Visit (INDEPENDENT_AMBULATORY_CARE_PROVIDER_SITE_OTHER): Payer: Self-pay | Admitting: Nurse Practitioner

## 2021-10-28 ENCOUNTER — Encounter (INDEPENDENT_AMBULATORY_CARE_PROVIDER_SITE_OTHER): Payer: Self-pay | Admitting: Nurse Practitioner

## 2021-10-28 ENCOUNTER — Ambulatory Visit (INDEPENDENT_AMBULATORY_CARE_PROVIDER_SITE_OTHER): Payer: Medicare HMO | Admitting: Nurse Practitioner

## 2021-10-28 VITALS — BP 125/82 | HR 61 | Resp 16

## 2021-10-28 DIAGNOSIS — I890 Lymphedema, not elsewhere classified: Secondary | ICD-10-CM | POA: Diagnosis not present

## 2021-10-28 NOTE — Progress Notes (Signed)
History of Present Illness  There is no documented history at this time  Assessments & Plan   There are no diagnoses linked to this encounter.    Additional instructions  Subjective:  Patient presents with venous ulcer of the Right lower extremity.    Procedure:  3 layer unna wrap was placed Right lower extremity.   Plan:   Follow up in one week.   

## 2021-10-29 ENCOUNTER — Ambulatory Visit (INDEPENDENT_AMBULATORY_CARE_PROVIDER_SITE_OTHER): Payer: Medicare HMO | Admitting: Nurse Practitioner

## 2021-10-31 ENCOUNTER — Ambulatory Visit (INDEPENDENT_AMBULATORY_CARE_PROVIDER_SITE_OTHER): Payer: Medicare HMO | Admitting: Vascular Surgery

## 2021-10-31 LAB — AEROBIC CULTURE

## 2021-11-04 ENCOUNTER — Ambulatory Visit (INDEPENDENT_AMBULATORY_CARE_PROVIDER_SITE_OTHER): Payer: Medicare HMO | Admitting: Nurse Practitioner

## 2021-11-04 ENCOUNTER — Encounter (INDEPENDENT_AMBULATORY_CARE_PROVIDER_SITE_OTHER): Payer: Self-pay

## 2021-11-04 VITALS — BP 151/84 | HR 66 | Resp 16

## 2021-11-04 DIAGNOSIS — I890 Lymphedema, not elsewhere classified: Secondary | ICD-10-CM

## 2021-11-04 NOTE — Progress Notes (Unsigned)
History of Present Illness  There is no documented history at this time  Assessments & Plan   There are no diagnoses linked to this encounter.    Additional instructions  Subjective:  Patient presents with venous ulcer of the Right lower extremity.    Procedure:  3 layer unna wrap was placed Right lower extremity.   Plan:   Follow up in one week.   

## 2021-11-05 ENCOUNTER — Encounter (INDEPENDENT_AMBULATORY_CARE_PROVIDER_SITE_OTHER): Payer: Self-pay | Admitting: Nurse Practitioner

## 2021-11-11 ENCOUNTER — Ambulatory Visit (INDEPENDENT_AMBULATORY_CARE_PROVIDER_SITE_OTHER): Payer: Medicare HMO | Admitting: Nurse Practitioner

## 2021-11-11 ENCOUNTER — Encounter (INDEPENDENT_AMBULATORY_CARE_PROVIDER_SITE_OTHER): Payer: Self-pay

## 2021-11-11 VITALS — BP 136/75 | HR 65 | Resp 16

## 2021-11-11 DIAGNOSIS — I890 Lymphedema, not elsewhere classified: Secondary | ICD-10-CM

## 2021-11-11 NOTE — Progress Notes (Signed)
History of Present Illness  There is no documented history at this time  Assessments & Plan   There are no diagnoses linked to this encounter.    Additional instructions  Subjective:  Patient presents with venous ulcer of the Right lower extremity.    Procedure:  3 layer unna wrap was placed Right lower extremity.   Plan:   Follow up in one week.   

## 2021-11-18 ENCOUNTER — Ambulatory Visit (INDEPENDENT_AMBULATORY_CARE_PROVIDER_SITE_OTHER): Payer: Medicare HMO | Admitting: Nurse Practitioner

## 2021-11-18 ENCOUNTER — Encounter (INDEPENDENT_AMBULATORY_CARE_PROVIDER_SITE_OTHER): Payer: Self-pay | Admitting: Nurse Practitioner

## 2021-11-18 VITALS — BP 149/80 | HR 67 | Resp 16

## 2021-11-18 DIAGNOSIS — L98499 Non-pressure chronic ulcer of skin of other sites with unspecified severity: Secondary | ICD-10-CM | POA: Diagnosis not present

## 2021-11-18 DIAGNOSIS — I10 Essential (primary) hypertension: Secondary | ICD-10-CM

## 2021-11-18 DIAGNOSIS — E11622 Type 2 diabetes mellitus with other skin ulcer: Secondary | ICD-10-CM

## 2021-11-18 DIAGNOSIS — L97211 Non-pressure chronic ulcer of right calf limited to breakdown of skin: Secondary | ICD-10-CM

## 2021-11-24 ENCOUNTER — Encounter (INDEPENDENT_AMBULATORY_CARE_PROVIDER_SITE_OTHER): Payer: Self-pay | Admitting: Nurse Practitioner

## 2021-11-24 NOTE — Progress Notes (Signed)
Subjective:    Patient ID: [name], female    DOB: 11-09-1945, 76 y.o.   MRN: [id number] Chief Complaint  Patient presents with   Follow-up    Ultrasound follow up    [name] is a 76 year old female that presents today for follow-up for wound evaluation.  She had a blister form on her left lower extremity which created an open wound.  It is suspected this may have been due to some irritation from her compression wraps.  Currently she has been in [employer] for several weeks.  The wound is drastically improved.  There is just some shallow wound area remaining.  She is tolerating the wraps well.    Review of Systems  Cardiovascular:  Positive for leg swelling.  Skin:  Positive for wound.  All other systems reviewed and are negative.      Objective:   Physical Exam Vitals reviewed.  HENT:     Head: Normocephalic.  Cardiovascular:     Rate and Rhythm: Normal rate.  Pulmonary:     Effort: Pulmonary effort is normal.  Musculoskeletal:     Right lower leg: Edema present.  Skin:    General: Skin is warm and dry.  Neurological:     Mental Status: She is alert and oriented to person, place, and time.  Psychiatric:        Mood and Affect: Mood normal.        Behavior: Behavior normal.        Thought Content: Thought content normal.        Judgment: Judgment normal.     BP (!) 149/80 (BP Location: Right Arm)   Pulse 67   Resp 16   Past Medical History:  Diagnosis Date   Anemia    as young child   Arthritis    back, leg and foot   Blood transfusion    1964   Carpal tunnel syndrome    Diabetes mellitus    oral medications   Hypertension     Social History   Socioeconomic History   Marital status: Married    Spouse name: Not on file   Number of children: Not on file   Years of education: Not on file   Highest education level: Not on file  Occupational History   Not on file  Tobacco Use   Smoking status: Never   Smokeless tobacco: Never  Substance  and Sexual Activity   Alcohol use: No   Drug use: No   Sexual activity: Not on file  Other Topics Concern   Not on file  Social History Narrative   Not on file   Social Determinants of Health   Financial Resource Strain: Not on file  Food Insecurity: Not on file  Transportation Needs: Not on file  Physical Activity: Not on file  Stress: Not on file  Social Connections: Not on file  Intimate Partner Violence: Not on file    Past Surgical History:  Procedure Laterality Date   ABDOMINAL HYSTERECTOMY     ANTERIOR CERVICAL DECOMP/DISCECTOMY FUSION  04/27/2011   Procedure: ANTERIOR CERVICAL DECOMPRESSION/DISCECTOMY FUSION 2 LEVELS;  Surgeon: Elaina Hoops, MD;  Location: [place] [facility];  Service: Neurosurgery;  Laterality: N/A;  Anterior Cervical Decompression/ Discectomy Fusion Cervical Three-Four, Cervical Four-Five   APPENDECTOMY     KNEE ARTHROPLASTY     right   PERIPHERAL VASCULAR CATHETERIZATION Left 10/29/2014   Procedure: Lower Extremity Angiography;  Surgeon: Algernon Huxley, MD;  Location:  [facility];  Service: Cardiovascular;  Laterality: Left;   PERIPHERAL VASCULAR CATHETERIZATION Left 10/29/2014   Procedure: Lower Extremity Intervention;  Surgeon: Annice Needy, MD;  Location: [facility];  Service: Cardiovascular;  Laterality: Left;   PERIPHERAL VASCULAR CATHETERIZATION Left 03/11/2015   Procedure: Lower Extremity Angiography;  Surgeon: Annice Needy, MD;  Location: [facility];  Service: Cardiovascular;  Laterality: Left;   PERIPHERAL VASCULAR CATHETERIZATION Left 03/11/2015   Procedure: Lower Extremity Intervention;  Surgeon: Annice Needy, MD;  Location: [facility];  Service: Cardiovascular;  Laterality: Left;    Family History  Problem Relation Age of Onset   Cancer Mother    Hypertension Mother    Diabetes Paternal Grandmother     Allergies  Allergen Reactions   Penicillins Shortness Of Breath and Swelling   Cheese Itching   Codeine  Other (See Comments)    Ringing in ear TOLERATES Tramadol Other reaction(s): Other (See Comments) RINGING IN THE EARS Ringing in ear TOLERATES Tramadol   Strawberry (Diagnostic) Hives   Strawberry Extract Hives       Latest Ref Rng & Units 06/09/2016    6:32 PM 08/10/2013    1:15 PM 04/22/2011    1:37 PM  CBC  WBC 3.6 - 11.0 K/uL 2.3  4.0  8.2   Hemoglobin 12.0 - 16.0 g/dL 44.3  15.4  00.8   Hematocrit 35.0 - 47.0 % 35.9  36.1  37.0   Platelets 150 - 440 K/uL 151  196  228       CMP     Component Value Date/Time   NA 139 06/09/2016 1832   NA 139 08/10/2013 1315   K 3.4 (L) 06/09/2016 1832   K 3.7 08/10/2013 1315   CL 100 (L) 06/09/2016 1832   CL 105 08/10/2013 1315   CO2 29 06/09/2016 1832   CO2 28 08/10/2013 1315   GLUCOSE 295 (H) 06/09/2016 1832   GLUCOSE 310 (H) 08/10/2013 1315   BUN 27 (H) 06/09/2016 1832   BUN 15 08/10/2013 1315   CREATININE 1.65 (H) 06/09/2016 1832   CREATININE 1.15 08/10/2013 1315   CALCIUM 8.3 (L) 06/09/2016 1832   CALCIUM 8.8 08/10/2013 1315   PROT 6.8 06/09/2016 1832   ALBUMIN 3.4 (L) 06/09/2016 1832   AST 41 06/09/2016 1832   ALT 28 06/09/2016 1832   ALKPHOS 73 06/09/2016 1832   BILITOT 0.4 06/09/2016 1832   GFRNONAA 30 (L) 06/09/2016 1832   GFRNONAA 49 (L) 08/10/2013 1315   GFRAA 35 (L) 06/09/2016 1832   GFRAA 56 (L) 08/10/2013 1315     No results found.     Assessment & Plan:   1. Lower limb ulcer, calf, right, limited to breakdown of skin (HCC) Today the patient's lower limb ulceration is doing well.  It still requires some progress with wound healing.  We will continue to have the patient come to the office on a weekly basis for wrap changes.  We will reevaluate in 4 weeks.  2. Essential hypertension Continue antihypertensive medications as already ordered, these medications have been reviewed and there are no changes at this time.   3. Type 2 diabetes mellitus with other skin ulcer, unspecified whether long term insulin  use (HCC) Continue hypoglycemic medications as already ordered, these medications have been reviewed and there are no changes at this time.  Hgb A1C to be monitored as already arranged by primary service    Current Outpatient Medications on  File Prior to Visit  Medication Sig Dispense Refill   ascorbic acid (VITAMIN C) 1000 MG tablet Take 1,000 mg by mouth daily.     aspirin EC 81 MG tablet Take 81 mg by mouth daily.     Cholecalciferol (D3) 50 MCG (2000 UT) TABS Take 2,000 Units by mouth daily.     clopidogrel (PLAVIX) 75 MG tablet Take 1 tablet (75 mg total) by mouth daily. 30 tablet 11   cyanocobalamin 1000 MCG tablet Take by mouth.     Docusate Calcium (STOOL SOFTENER PO) Take by mouth in the morning and at bedtime.     doxycycline (VIBRA-TABS) 100 MG tablet Take 100 mg by mouth 2 (two) times daily.     empagliflozin (JARDIANCE) 25 MG TABS tablet Take 25 mg by mouth daily.     furosemide (LASIX) 20 MG tablet Take 20 mg by mouth daily.     gabapentin (NEURONTIN) 100 MG capsule Take 100 mg by mouth 2 (two) times daily.     glipiZIDE (GLUCOTROL XL) 5 MG 24 hr tablet Take 5 mg by mouth 2 (two) times daily.     lisinopril (ZESTRIL) 20 MG tablet Take 20 mg by mouth daily.     metFORMIN (GLUCOPHAGE) 1000 MG tablet Take 1,000 mg by mouth 2 (two) times daily with a meal.     metoprolol succinate (TOPROL-XL) 25 MG 24 hr tablet Take 1 tablet by mouth daily.     ONETOUCH VERIO test strip 1 each 2 (two) times daily.     pregabalin (LYRICA) 25 MG capsule Take 50 mg by mouth 3 (three) times daily.     rosuvastatin (CRESTOR) 5 MG tablet Take 5 mg by mouth daily.     Semaglutide (RYBELSUS) 7 MG TABS Take 7 mg by mouth daily.     traMADol (ULTRAM) 50 MG tablet Take 1 tablet (50 mg total) by mouth every 6 (six) hours as needed. 20 tablet 0   celecoxib (CELEBREX) 200 MG capsule Take 200 mg by mouth daily. (Patient not taking: Reported on 07/30/2021)     ciprofloxacin (CIPRO) 500 MG tablet Take 1 tablet  (500 mg total) by mouth 2 (two) times daily. (Patient not taking: Reported on 01/08/2021) 10 tablet 0   liraglutide (VICTOZA) 18 MG/3ML SOPN Inject into the skin. (Patient not taking: Reported on 11/22/2020)     lisinopril-hydrochlorothiazide (PRINZIDE,ZESTORETIC) 20-25 MG per tablet Take 1 tablet by mouth daily. (Patient not taking: Reported on 11/22/2020)     methocarbamol (ROBAXIN) 500 MG tablet Take 1 tablet (500 mg total) by mouth 4 (four) times daily. (Patient not taking: Reported on 07/30/2021) 20 tablet 0   mupirocin ointment (BACTROBAN) 2 % Place 1 application on wound daily (Patient not taking: Reported on 07/30/2021) 22 g 0   OZEMPIC, 0.25 OR 0.5 MG/DOSE, 2 MG/1.5ML SOPN Inject into the skin. (Patient not taking: Reported on 07/30/2021)     sulfamethoxazole-trimethoprim (BACTRIM DS) 800-160 MG tablet Take 1 tablet by mouth 2 (two) times daily. (Patient not taking: Reported on 03/19/2021) 20 tablet 0   No current facility-administered medications on file prior to visit.    There are no Patient Instructions on file for this visit. No follow-ups on file.   Georgiana Spinner, NP

## 2021-11-25 ENCOUNTER — Encounter (INDEPENDENT_AMBULATORY_CARE_PROVIDER_SITE_OTHER): Payer: Self-pay

## 2021-11-25 ENCOUNTER — Ambulatory Visit (INDEPENDENT_AMBULATORY_CARE_PROVIDER_SITE_OTHER): Payer: Medicare HMO | Admitting: Nurse Practitioner

## 2021-11-25 VITALS — BP 133/77 | HR 74 | Resp 16

## 2021-11-25 DIAGNOSIS — I890 Lymphedema, not elsewhere classified: Secondary | ICD-10-CM | POA: Diagnosis not present

## 2021-11-25 NOTE — Progress Notes (Unsigned)
History of Present Illness  There is no documented history at this time  Assessments & Plan   There are no diagnoses linked to this encounter.    Additional instructions  Subjective:  Patient presents with venous ulcer of the Right lower extremity.    Procedure:  3 layer unna wrap was placed Right lower extremity.   Plan:   Follow up in one week.   

## 2021-11-26 ENCOUNTER — Encounter (INDEPENDENT_AMBULATORY_CARE_PROVIDER_SITE_OTHER): Payer: Self-pay | Admitting: Nurse Practitioner

## 2021-12-02 ENCOUNTER — Ambulatory Visit (INDEPENDENT_AMBULATORY_CARE_PROVIDER_SITE_OTHER): Payer: Medicare HMO | Admitting: Nurse Practitioner

## 2021-12-02 ENCOUNTER — Encounter (INDEPENDENT_AMBULATORY_CARE_PROVIDER_SITE_OTHER): Payer: Self-pay | Admitting: Nurse Practitioner

## 2021-12-02 VITALS — BP 148/72 | HR 68 | Resp 16

## 2021-12-02 DIAGNOSIS — I890 Lymphedema, not elsewhere classified: Secondary | ICD-10-CM

## 2021-12-02 NOTE — Progress Notes (Signed)
History of Present Illness  There is no documented history at this time  Assessments & Plan   There are no diagnoses linked to this encounter.    Additional instructions  Subjective:  Patient presents with venous ulcer of the Right lower extremity.    Procedure:  3 layer unna wrap was placed Right lower extremity.   Plan:   Follow up in one week.   

## 2021-12-07 ENCOUNTER — Encounter (INDEPENDENT_AMBULATORY_CARE_PROVIDER_SITE_OTHER): Payer: Self-pay | Admitting: Nurse Practitioner

## 2021-12-09 ENCOUNTER — Encounter (INDEPENDENT_AMBULATORY_CARE_PROVIDER_SITE_OTHER): Payer: Self-pay

## 2021-12-09 ENCOUNTER — Ambulatory Visit (INDEPENDENT_AMBULATORY_CARE_PROVIDER_SITE_OTHER): Payer: Medicare HMO | Admitting: Nurse Practitioner

## 2021-12-09 VITALS — BP 146/75 | HR 74 | Resp 16 | Wt 278.4 lb

## 2021-12-09 DIAGNOSIS — L97211 Non-pressure chronic ulcer of right calf limited to breakdown of skin: Secondary | ICD-10-CM | POA: Diagnosis not present

## 2021-12-09 NOTE — Progress Notes (Signed)
History of Present Illness  There is no documented history at this time  Assessments & Plan   There are no diagnoses linked to this encounter.    Additional instructions  Subjective:  Patient presents with venous ulcer of the Right lower extremity.    Procedure:  3 layer unna wrap was placed Right lower extremity.   Plan:   Follow up in one week.   

## 2021-12-16 ENCOUNTER — Ambulatory Visit (INDEPENDENT_AMBULATORY_CARE_PROVIDER_SITE_OTHER): Payer: Medicare HMO | Admitting: Nurse Practitioner

## 2021-12-16 ENCOUNTER — Encounter (INDEPENDENT_AMBULATORY_CARE_PROVIDER_SITE_OTHER): Payer: Self-pay

## 2021-12-16 VITALS — BP 166/78 | HR 74 | Resp 16

## 2021-12-16 DIAGNOSIS — L97211 Non-pressure chronic ulcer of right calf limited to breakdown of skin: Secondary | ICD-10-CM | POA: Diagnosis not present

## 2021-12-16 NOTE — Progress Notes (Signed)
History of Present Illness  There is no documented history at this time  Assessments & Plan   There are no diagnoses linked to this encounter.    Additional instructions  Subjective:  Patient presents with venous ulcer of the Right lower extremity.    Procedure:  3 layer unna wrap was placed Right lower extremity.   Plan:   Follow up in one week.   

## 2021-12-21 ENCOUNTER — Encounter (INDEPENDENT_AMBULATORY_CARE_PROVIDER_SITE_OTHER): Payer: Self-pay | Admitting: Nurse Practitioner

## 2021-12-24 ENCOUNTER — Ambulatory Visit (INDEPENDENT_AMBULATORY_CARE_PROVIDER_SITE_OTHER): Payer: Medicare HMO | Admitting: Nurse Practitioner

## 2021-12-24 ENCOUNTER — Encounter (INDEPENDENT_AMBULATORY_CARE_PROVIDER_SITE_OTHER): Payer: Medicare HMO

## 2021-12-24 ENCOUNTER — Other Ambulatory Visit (INDEPENDENT_AMBULATORY_CARE_PROVIDER_SITE_OTHER): Payer: Self-pay | Admitting: Nurse Practitioner

## 2021-12-24 DIAGNOSIS — L97211 Non-pressure chronic ulcer of right calf limited to breakdown of skin: Secondary | ICD-10-CM

## 2021-12-30 ENCOUNTER — Encounter (INDEPENDENT_AMBULATORY_CARE_PROVIDER_SITE_OTHER): Payer: Medicare HMO

## 2021-12-30 ENCOUNTER — Ambulatory Visit (INDEPENDENT_AMBULATORY_CARE_PROVIDER_SITE_OTHER): Payer: Medicare HMO | Admitting: Nurse Practitioner

## 2022-02-03 ENCOUNTER — Ambulatory Visit (INDEPENDENT_AMBULATORY_CARE_PROVIDER_SITE_OTHER): Payer: Medicare HMO | Admitting: Nurse Practitioner

## 2022-02-03 ENCOUNTER — Ambulatory Visit (INDEPENDENT_AMBULATORY_CARE_PROVIDER_SITE_OTHER): Payer: Medicare HMO

## 2022-02-03 ENCOUNTER — Encounter (INDEPENDENT_AMBULATORY_CARE_PROVIDER_SITE_OTHER): Payer: Self-pay | Admitting: Nurse Practitioner

## 2022-02-03 VITALS — BP 143/68 | HR 70 | Resp 17 | Ht 67.0 in | Wt 276.0 lb

## 2022-02-03 DIAGNOSIS — I890 Lymphedema, not elsewhere classified: Secondary | ICD-10-CM

## 2022-02-03 DIAGNOSIS — L97211 Non-pressure chronic ulcer of right calf limited to breakdown of skin: Secondary | ICD-10-CM

## 2022-02-03 DIAGNOSIS — I10 Essential (primary) hypertension: Secondary | ICD-10-CM | POA: Diagnosis not present

## 2022-02-03 DIAGNOSIS — E11622 Type 2 diabetes mellitus with other skin ulcer: Secondary | ICD-10-CM | POA: Diagnosis not present

## 2022-02-03 DIAGNOSIS — L98499 Non-pressure chronic ulcer of skin of other sites with unspecified severity: Secondary | ICD-10-CM | POA: Diagnosis not present

## 2022-02-16 ENCOUNTER — Encounter (INDEPENDENT_AMBULATORY_CARE_PROVIDER_SITE_OTHER): Payer: Self-pay | Admitting: Nurse Practitioner

## 2022-02-16 NOTE — Progress Notes (Addendum)
Subjective:    Patient ID: [name], female    DOB: December 28, 1945, 77 y.o.   MRN: [id number] Chief Complaint  Patient presents with   Follow-up    ultrasound    [name] is a 77 year old female that presents today for follow-up for lymphedema.  Patient has been out of Unna boots for approximately 2 months.  Been diligent with use of elevation and compression and has been doing well with this.  She denies any new open wounds or ulcerations or any weeping in the interim.  The patient also had repeat ABIs done today which showed an ABI of 1.18 bilaterally.  With triphasic tibial artery waveforms bilaterally with normal toe waveforms bilaterally.    Review of Systems  Cardiovascular:  Positive for leg swelling.  All other systems reviewed and are negative.      Objective:   Physical Exam Vitals reviewed.  HENT:     Head: Normocephalic.  Cardiovascular:     Rate and Rhythm: Normal rate.     Pulses:          Dorsalis pedis pulses are 1+ on the right side and 1+ on the left side.  Pulmonary:     Effort: Pulmonary effort is normal.  Musculoskeletal:     Right lower leg: Edema present.     Left lower leg: Edema present.  Skin:    General: Skin is warm and dry.  Neurological:     Mental Status: She is alert and oriented to person, place, and time.  Psychiatric:        Mood and Affect: Mood normal.        Behavior: Behavior normal.        Thought Content: Thought content normal.        Judgment: Judgment normal.     BP (!) 143/68 (BP Location: Right Arm)   Pulse 70   Resp 17   Ht 5\' 7"  (1.702 m)   Wt 276 lb (125.2 kg)   BMI 43.23 kg/m   Past Medical History:  Diagnosis Date   Anemia    as young child   Arthritis    back, leg and foot   Blood transfusion    1964   Carpal tunnel syndrome    Diabetes mellitus    oral medications   Hypertension     Social History   Socioeconomic History   Marital status: Married    Spouse name: Not on file   Number of  children: Not on file   Years of education: Not on file   Highest education level: Not on file  Occupational History   Not on file  Tobacco Use   Smoking status: Never   Smokeless tobacco: Never  Substance and Sexual Activity   Alcohol use: No   Drug use: No   Sexual activity: Not on file  Other Topics Concern   Not on file  Social History Narrative   Not on file   Social Determinants of Health   Financial Resource Strain: Not on file  Food Insecurity: Not on file  Transportation Needs: Not on file  Physical Activity: Not on file  Stress: Not on file  Social Connections: Not on file  Intimate Partner Violence: Not on file    Past Surgical History:  Procedure Laterality Date   ABDOMINAL HYSTERECTOMY     ANTERIOR CERVICAL DECOMP/DISCECTOMY FUSION  04/27/2011   Procedure: ANTERIOR CERVICAL DECOMPRESSION/DISCECTOMY FUSION 2 LEVELS;  Surgeon: 06/27/2011, MD;  Location:  [place] [facility];  Service: Neurosurgery;  Laterality: N/A;  Anterior Cervical Decompression/ Discectomy Fusion Cervical Three-Four, Cervical Four-Five   APPENDECTOMY     KNEE ARTHROPLASTY     right   PERIPHERAL VASCULAR CATHETERIZATION Left 10/29/2014   Procedure: Lower Extremity Angiography;  Surgeon: Algernon Huxley, MD;  Location: [place] [facility];  Service: Cardiovascular;  Laterality: Left;   PERIPHERAL VASCULAR CATHETERIZATION Left 10/29/2014   Procedure: Lower Extremity Intervention;  Surgeon: Algernon Huxley, MD;  Location: [place] [facility];  Service: Cardiovascular;  Laterality: Left;   PERIPHERAL VASCULAR CATHETERIZATION Left 03/11/2015   Procedure: Lower Extremity Angiography;  Surgeon: Algernon Huxley, MD;  Location: [place] [facility];  Service: Cardiovascular;  Laterality: Left;   PERIPHERAL VASCULAR CATHETERIZATION Left 03/11/2015   Procedure: Lower Extremity Intervention;  Surgeon: Algernon Huxley, MD;  Location: [place] [facility];  Service: Cardiovascular;  Laterality: Left;    Family History  Problem  Relation Age of Onset   Cancer Mother    Hypertension Mother    Diabetes Paternal Grandmother     Allergies  Allergen Reactions   Penicillins Shortness Of Breath and Swelling   Cheese Itching   Codeine Other (See Comments)    Ringing in ear TOLERATES Tramadol Other reaction(s): Other (See Comments) RINGING IN THE EARS Ringing in ear TOLERATES Tramadol   Strawberry (Diagnostic) Hives   Strawberry Extract Hives       Latest Ref Rng & Units 06/09/2016    6:32 PM 08/10/2013    1:15 PM 04/22/2011    1:37 PM  CBC  WBC 3.6 - 11.0 K/uL 2.3  4.0  8.2   Hemoglobin 12.0 - 16.0 g/dL 11.8  11.5  12.4   Hematocrit 35.0 - 47.0 % 35.9  36.1  37.0   Platelets 150 - 440 K/uL 151  196  228       CMP     Component Value Date/Time   NA 139 06/09/2016 1832   NA 139 08/10/2013 1315   K 3.4 (L) 06/09/2016 1832   K 3.7 08/10/2013 1315   CL 100 (L) 06/09/2016 1832   CL 105 08/10/2013 1315   CO2 29 06/09/2016 1832   CO2 28 08/10/2013 1315   GLUCOSE 295 (H) 06/09/2016 1832   GLUCOSE 310 (H) 08/10/2013 1315   BUN 27 (H) 06/09/2016 1832   BUN 15 08/10/2013 1315   CREATININE 1.65 (H) 06/09/2016 1832   CREATININE 1.15 08/10/2013 1315   CALCIUM 8.3 (L) 06/09/2016 1832   CALCIUM 8.8 08/10/2013 1315   PROT 6.8 06/09/2016 1832   ALBUMIN 3.4 (L) 06/09/2016 1832   AST 41 06/09/2016 1832   ALT 28 06/09/2016 1832   ALKPHOS 73 06/09/2016 1832   BILITOT 0.4 06/09/2016 1832   GFRNONAA 30 (L) 06/09/2016 1832   GFRNONAA 49 (L) 08/10/2013 1315   GFRAA 35 (L) 06/09/2016 1832   GFRAA 56 (L) 08/10/2013 1315     No results found.     Assessment & Plan:   1. Lymphedema The patient is doing well.  Her lymphedema is currently well-controlled.  No recommendation for Unna boots at this time.  Will plan on having the patient return in 3 months for reevaluation or sooner if she begins to have issues.  2. Primary hypertension Continue antihypertensive medications as already ordered, these medications  have been reviewed and there are no changes at this time.  3. Type 2 diabetes mellitus with other skin ulcer, unspecified whether long term insulin  use ([place]) Continue hypoglycemic medications as already ordered, these medications have been reviewed and there are no changes at this time.  Hgb A1C to be monitored as already arranged by primary service    Current Outpatient Medications on File Prior to Visit  Medication Sig Dispense Refill   ascorbic acid (VITAMIN C) 1000 MG tablet Take 1,000 mg by mouth daily.     aspirin EC 81 MG tablet Take 81 mg by mouth daily.     celecoxib (CELEBREX) 200 MG capsule Take 200 mg by mouth daily.     Cholecalciferol (D3) 50 MCG (2000 UT) TABS Take 2,000 Units by mouth daily.     ciprofloxacin (CIPRO) 500 MG tablet Take 1 tablet (500 mg total) by mouth 2 (two) times daily. 10 tablet 0   clopidogrel (PLAVIX) 75 MG tablet Take 1 tablet (75 mg total) by mouth daily. 30 tablet 11   cyanocobalamin 1000 MCG tablet Take by mouth.     Docusate Calcium (STOOL SOFTENER PO) Take by mouth in the morning and at bedtime.     doxycycline (VIBRA-TABS) 100 MG tablet Take 100 mg by mouth 2 (two) times daily.     empagliflozin (JARDIANCE) 25 MG TABS tablet Take 25 mg by mouth daily.     furosemide (LASIX) 20 MG tablet Take 20 mg by mouth daily.     gabapentin (NEURONTIN) 100 MG capsule Take 100 mg by mouth 2 (two) times daily.     glipiZIDE (GLUCOTROL XL) 5 MG 24 hr tablet Take 5 mg by mouth 2 (two) times daily.     liraglutide (VICTOZA) 18 MG/3ML SOPN Inject into the skin.     lisinopril (ZESTRIL) 20 MG tablet Take 20 mg by mouth daily.     lisinopril-hydrochlorothiazide (PRINZIDE,ZESTORETIC) 20-25 MG per tablet Take 1 tablet by mouth daily.     metFORMIN (GLUCOPHAGE) 1000 MG tablet Take 1,000 mg by mouth 2 (two) times daily with a meal.     methocarbamol (ROBAXIN) 500 MG tablet Take 1 tablet (500 mg total) by mouth 4 (four) times daily. 20 tablet 0   metoprolol succinate  (TOPROL-XL) 25 MG 24 hr tablet Take 1 tablet by mouth daily.     mupirocin ointment (BACTROBAN) 2 % Place 1 application on wound daily 22 g 0   ONETOUCH VERIO test strip 1 each 2 (two) times daily.     OZEMPIC, 0.25 OR 0.5 MG/DOSE, 2 MG/1.5ML SOPN Inject into the skin.     pregabalin (LYRICA) 25 MG capsule Take 50 mg by mouth 3 (three) times daily.     rosuvastatin (CRESTOR) 5 MG tablet Take 5 mg by mouth daily.     Semaglutide (RYBELSUS) 7 MG TABS Take 7 mg by mouth daily.     sulfamethoxazole-trimethoprim (BACTRIM DS) 800-160 MG tablet Take 1 tablet by mouth 2 (two) times daily. 20 tablet 0   traMADol (ULTRAM) 50 MG tablet Take 1 tablet (50 mg total) by mouth every 6 (six) hours as needed. 20 tablet 0   No current facility-administered medications on file prior to visit.    There are no Patient Instructions on file for this visit. No follow-ups on file.   Kris Hartmann, NP

## 2022-05-21 IMAGING — US US RENAL
1 series · 14 of 25 positions shown · non-contrast
Comparison: None.

CLINICAL DATA: Chronic kidney disease stage 3 B.

EXAM:
RENAL / URINARY TRACT ULTRASOUND COMPLETE

[Series 1: us renal · 0.30mm/px · 14 of 26 slices shown]
[im 1/26]
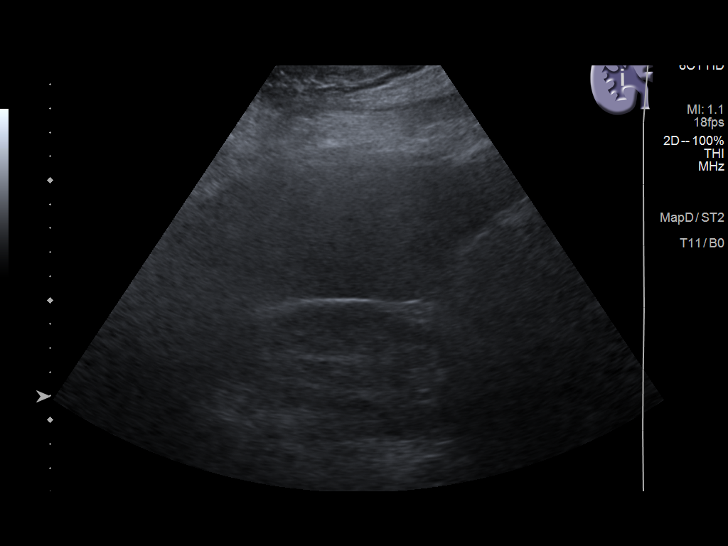
[im 3/26]
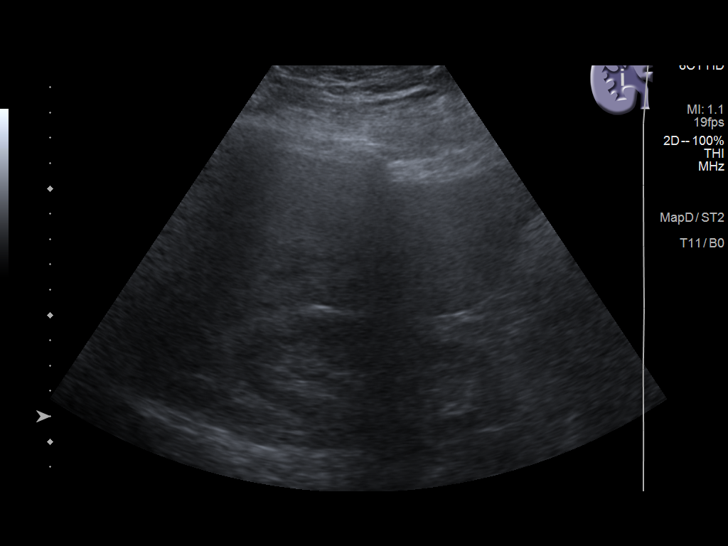
[im 5/26]
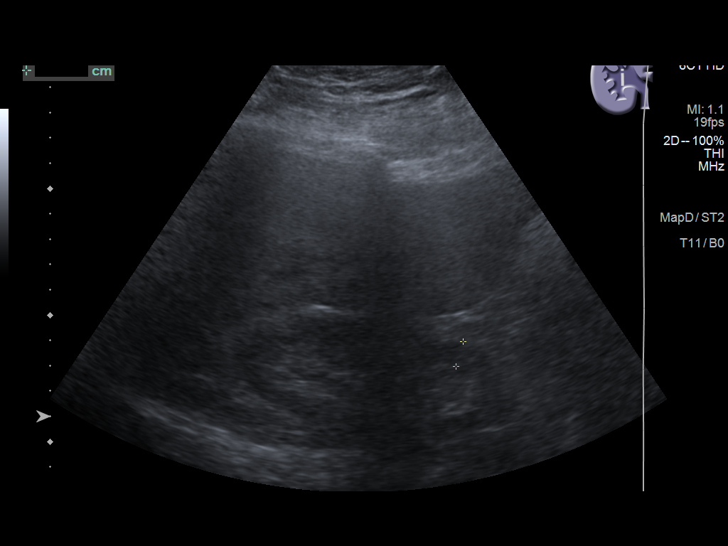
[im 7/26]
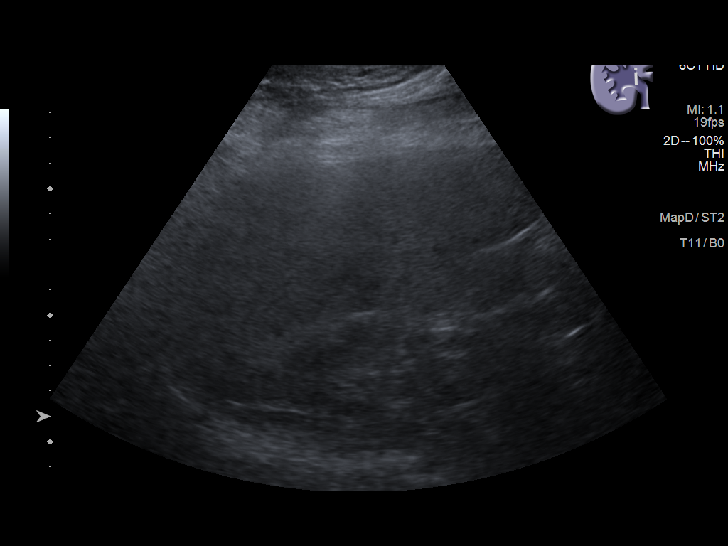
[im 9/26]
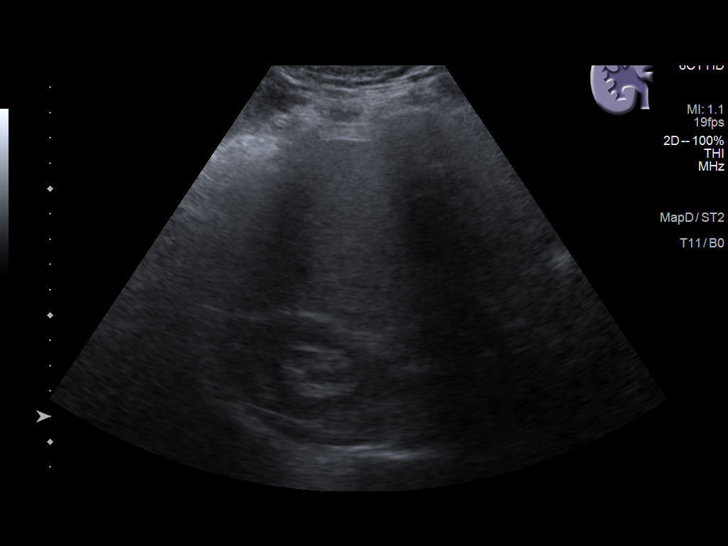
[im 10/26]
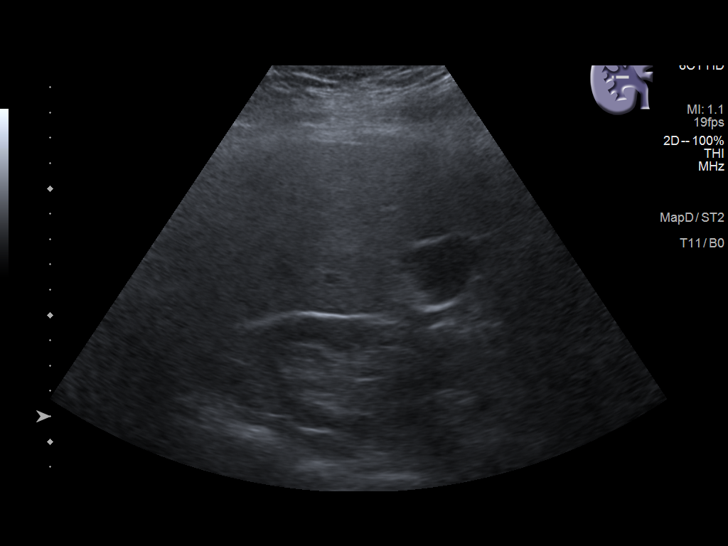
[im 12/26]
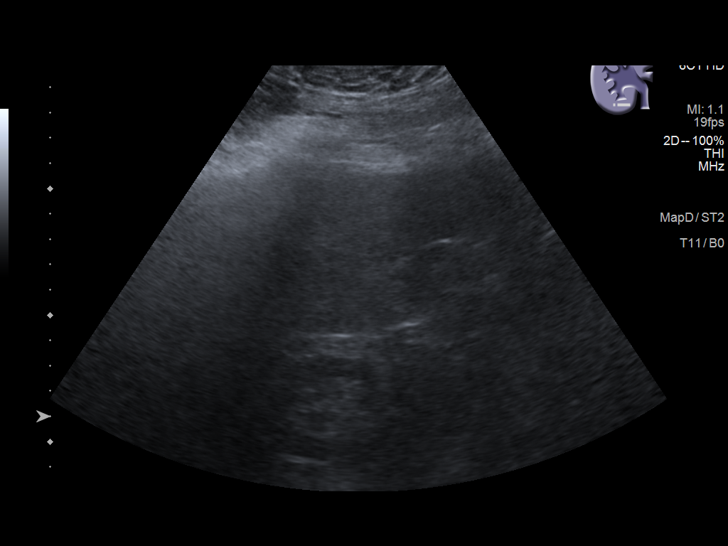
[im 14/26]
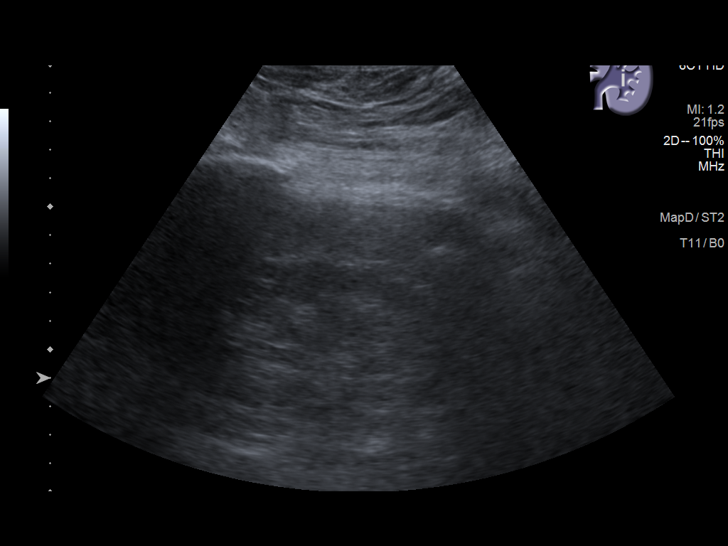
[im 16/26]
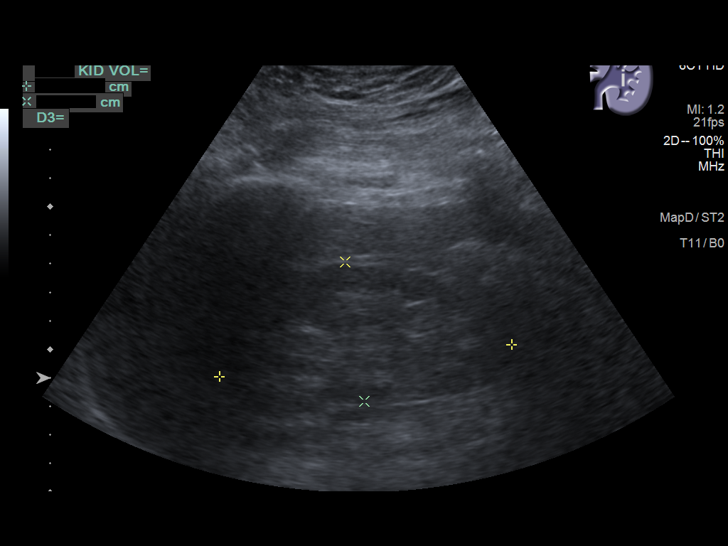
[im 17/26]
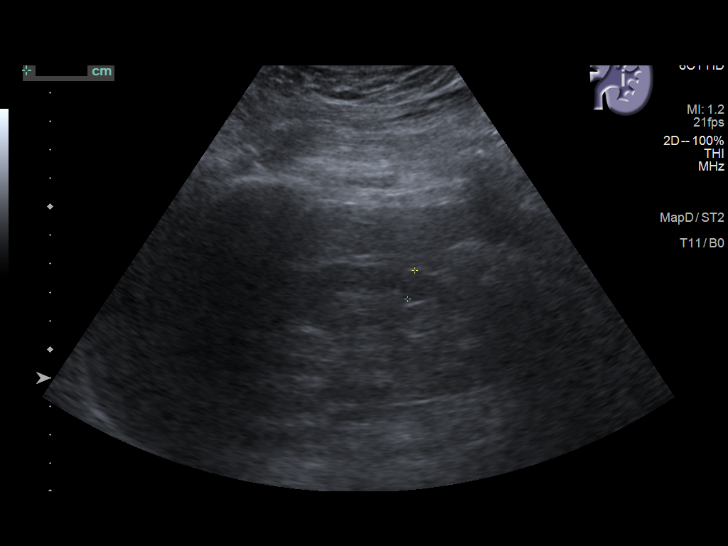
[im 19/26]
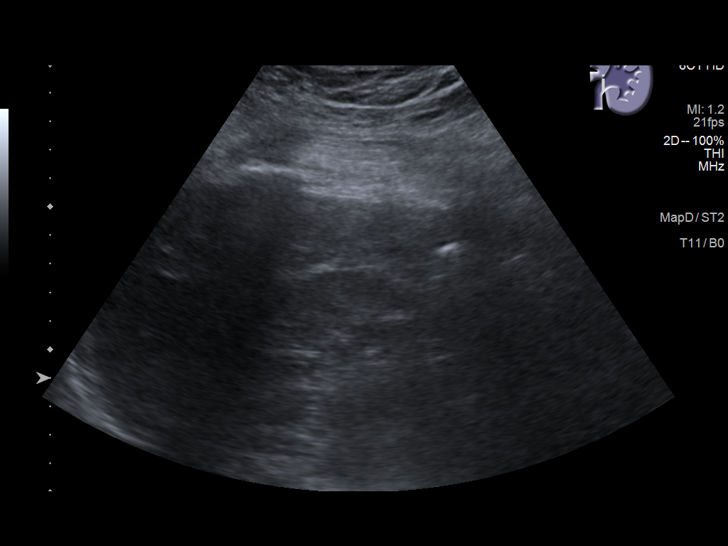
[im 21/26]
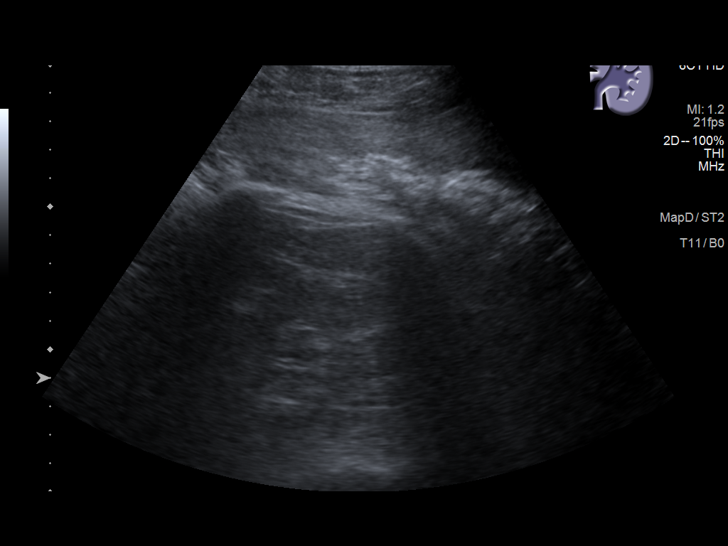
[im 23/26]
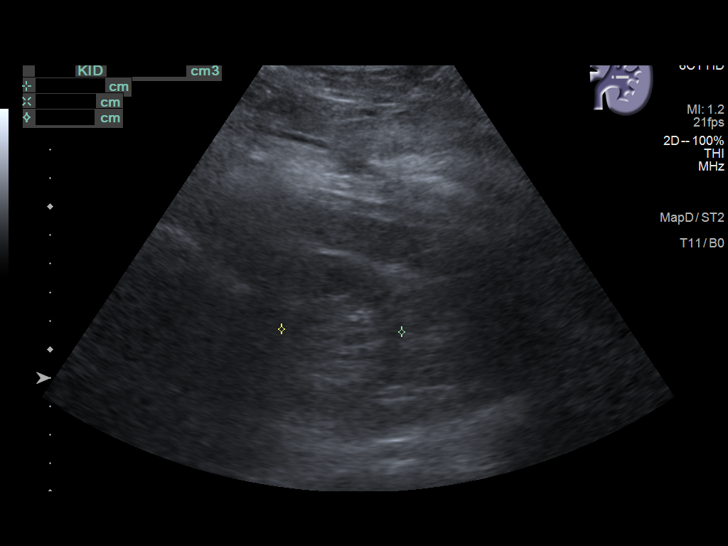
[im 26/26]
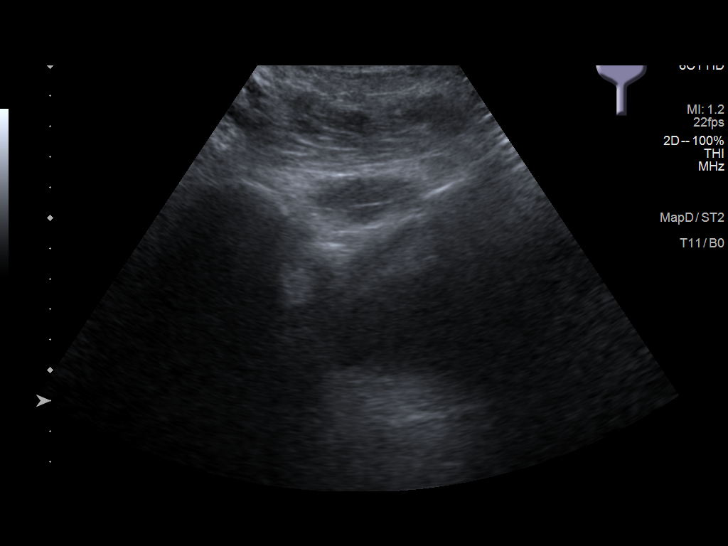

[14 of 25 positions shown; findings below may reference images not displayed]

FINDINGS: Right Kidney:

Renal measurements: 11.3 x 4.8 x 5.1 cm = volume: 144.4 mL.
Echogenicity within normal limits. No mass or hydronephrosis
visualized.

Left Kidney:

Renal measurements: 10.3 x 4.9 x 4.2 cm = volume: 111.9 mL.
Echogenicity within normal limits. No mass or hydronephrosis
visualized.

Bladder:

Poorly visualized.  No gross abnormality.

Other:

None.
IMPRESSION: 1. Normal sonographic appearance of the kidneys.

## 2022-07-28 ENCOUNTER — Encounter (INDEPENDENT_AMBULATORY_CARE_PROVIDER_SITE_OTHER): Payer: Self-pay | Admitting: Nurse Practitioner

## 2022-07-28 ENCOUNTER — Ambulatory Visit (INDEPENDENT_AMBULATORY_CARE_PROVIDER_SITE_OTHER): Payer: Medicare HMO | Admitting: Nurse Practitioner

## 2022-07-28 VITALS — BP 155/67 | HR 67 | Resp 18 | Ht 68.0 in | Wt 276.0 lb

## 2022-07-28 DIAGNOSIS — L98499 Non-pressure chronic ulcer of skin of other sites with unspecified severity: Secondary | ICD-10-CM | POA: Diagnosis not present

## 2022-07-28 DIAGNOSIS — L97211 Non-pressure chronic ulcer of right calf limited to breakdown of skin: Secondary | ICD-10-CM | POA: Diagnosis not present

## 2022-07-28 DIAGNOSIS — E11622 Type 2 diabetes mellitus with other skin ulcer: Secondary | ICD-10-CM

## 2022-07-28 DIAGNOSIS — I10 Essential (primary) hypertension: Secondary | ICD-10-CM

## 2022-07-28 MED ORDER — CIPROFLOXACIN HCL 250 MG PO TABS: 750.0000 mg | ORAL_TABLET | Freq: Two times a day (BID) | ORAL | 0 refills | Status: DC

## 2022-07-28 MED ORDER — TRAMADOL HCL 50 MG PO TABS
50.0000 mg | ORAL_TABLET | Freq: Four times a day (QID) | ORAL | 0 refills | Status: DC | PRN
Start: 2022-07-28 — End: 2023-09-21

## 2022-07-28 NOTE — Progress Notes (Signed)
Subjective:    Patient ID: [name], female    DOB: 11/16/1945, 77 y.o.   MRN: [id number] Chief Complaint  Patient presents with   Follow-up    6 months no studies    The patient returns to the office for followup evaluation regarding leg swelling.  The swelling has persisted and the pain associated with swelling continues.  She had a blister develop over the last few weeks which is now ruptured.  There has been some foul-smelling drainage.  She notes that it is very painful.  Since the previous visit the patient has been wearing graduated compression stockings and has noted little if any improvement in the lymphedema. The patient has been using compression routinely morning until night.  The patient also states elevation during the day and exercise is being done too.      Review of Systems  Cardiovascular:  Positive for leg swelling.  Skin:  Positive for wound.  All other systems reviewed and are negative.      Objective:   Physical Exam Vitals reviewed.  HENT:     Head: Normocephalic.  Cardiovascular:     Rate and Rhythm: Normal rate.  Pulmonary:     Effort: Pulmonary effort is normal.  Musculoskeletal:     Right lower leg: Edema present.  Skin:    General: Skin is warm and dry.  Neurological:     Mental Status: She is alert and oriented to person, place, and time.  Psychiatric:        Mood and Affect: Mood normal.        Behavior: Behavior normal.        Thought Content: Thought content normal.        Judgment: Judgment normal.     BP (!) 155/67 (BP Location: Right Wrist)   Pulse 67   Resp 18   Ht 5\' 8"  (1.727 m)   Wt 276 lb (125.2 kg)   BMI 41.97 kg/m   Past Medical History:  Diagnosis Date   Anemia    as young child   Arthritis    back, leg and foot   Blood transfusion    1964   Carpal tunnel syndrome    Diabetes mellitus    oral medications   Hypertension     Social History   Socioeconomic History   Marital status: Married     Spouse name: Not on file   Number of children: Not on file   Years of education: Not on file   Highest education level: Not on file  Occupational History   Not on file  Tobacco Use   Smoking status: Never   Smokeless tobacco: Never  Substance and Sexual Activity   Alcohol use: No   Drug use: No   Sexual activity: Not on file  Other Topics Concern   Not on file  Social History Narrative   Not on file   Social Determinants of Health   Financial Resource Strain: Not on file  Food Insecurity: Not on file  Transportation Needs: Not on file  Physical Activity: Not on file  Stress: Not on file  Social Connections: Not on file  Intimate Partner Violence: Not on file    Past Surgical History:  Procedure Laterality Date   ABDOMINAL HYSTERECTOMY     ANTERIOR CERVICAL DECOMP/DISCECTOMY FUSION  04/27/2011   Procedure: ANTERIOR CERVICAL DECOMPRESSION/DISCECTOMY FUSION 2 LEVELS;  Surgeon: Mariam Dollar, MD;  Location: [facility];  Service: Neurosurgery;  Laterality: N/A;  Anterior Cervical Decompression/ Discectomy Fusion Cervical Three-Four, Cervical Four-Five   APPENDECTOMY     KNEE ARTHROPLASTY     right   PERIPHERAL VASCULAR CATHETERIZATION Left 10/29/2014   Procedure: Lower Extremity Angiography;  Surgeon: Annice Needy, MD;  Location: [facility];  Service: Cardiovascular;  Laterality: Left;   PERIPHERAL VASCULAR CATHETERIZATION Left 10/29/2014   Procedure: Lower Extremity Intervention;  Surgeon: Annice Needy, MD;  Location: [facility];  Service: Cardiovascular;  Laterality: Left;   PERIPHERAL VASCULAR CATHETERIZATION Left 03/11/2015   Procedure: Lower Extremity Angiography;  Surgeon: Annice Needy, MD;  Location: [facility];  Service: Cardiovascular;  Laterality: Left;   PERIPHERAL VASCULAR CATHETERIZATION Left 03/11/2015   Procedure: Lower Extremity Intervention;  Surgeon: Annice Needy, MD;  Location: [facility];  Service: Cardiovascular;  Laterality:  Left;    Family History  Problem Relation Age of Onset   Cancer Mother    Hypertension Mother    Diabetes Paternal Grandmother     Allergies  Allergen Reactions   Penicillins Shortness Of Breath and Swelling   Cheese Itching   Codeine Other (See Comments)    Ringing in ear TOLERATES Tramadol Other reaction(s): Other (See Comments) RINGING IN THE EARS Ringing in ear TOLERATES Tramadol   Strawberry (Diagnostic) Hives   Strawberry Extract Hives       Latest Ref Rng & Units 06/09/2016    6:32 PM 08/10/2013    1:15 PM 04/22/2011    1:37 PM  CBC  WBC 3.6 - 11.0 K/uL 2.3  4.0  8.2   Hemoglobin 12.0 - 16.0 g/dL 16.1  09.6  04.5   Hematocrit 35.0 - 47.0 % 35.9  36.1  37.0   Platelets 150 - 440 K/uL 151  196  228       CMP     Component Value Date/Time   NA 139 06/09/2016 1832   NA 139 08/10/2013 1315   K 3.4 (L) 06/09/2016 1832   K 3.7 08/10/2013 1315   CL 100 (L) 06/09/2016 1832   CL 105 08/10/2013 1315   CO2 29 06/09/2016 1832   CO2 28 08/10/2013 1315   GLUCOSE 295 (H) 06/09/2016 1832   GLUCOSE 310 (H) 08/10/2013 1315   BUN 27 (H) 06/09/2016 1832   BUN 15 08/10/2013 1315   CREATININE 1.65 (H) 06/09/2016 1832   CREATININE 1.15 08/10/2013 1315   CALCIUM 8.3 (L) 06/09/2016 1832   CALCIUM 8.8 08/10/2013 1315   PROT 6.8 06/09/2016 1832   ALBUMIN 3.4 (L) 06/09/2016 1832   AST 41 06/09/2016 1832   ALT 28 06/09/2016 1832   ALKPHOS 73 06/09/2016 1832   BILITOT 0.4 06/09/2016 1832   GFRNONAA 30 (L) 06/09/2016 1832   GFRNONAA 49 (L) 08/10/2013 1315     No results found.     Assessment & Plan:   1. Lower limb ulcer, calf, right, limited to breakdown of skin (HCC) No surgery or intervention at this point in time.    I have had a long discussion with the patient regarding venous insufficiency and why it  causes symptoms, specifically venous ulceration. I have discussed with the patient the chronic skin changes that accompany venous insufficiency and the long term  sequela such as infection and recurring  ulceration.  Patient will be placed in [employer] which will be changed weekly drainage permitting.  The ulceration is painful so we will prescribe pain medicine as well.  We have also sent in  some antibiotics based on previous cultures given the drainage present - ciprofloxacin (CIPRO) 250 MG tablet; Take 3 tablets (750 mg total) by mouth 2 (two) times daily.  Dispense: 60 tablet; Refill: 0 - traMADol (ULTRAM) 50 MG tablet; Take 1 tablet (50 mg total) by mouth every 6 (six) hours as needed.  Dispense: 20 tablet; Refill: 0  2. Type 2 diabetes mellitus with other skin ulcer, unspecified whether long term insulin use (HCC) Continue hypoglycemic medications as already ordered, these medications have been reviewed and there are no changes at this time.  Hgb A1C to be monitored as already arranged by primary service  3. Primary hypertension Continue antihypertensive medications as already ordered, these medications have been reviewed and there are no changes at this time.      Current Outpatient Medications on File Prior to Visit  Medication Sig Dispense Refill   ascorbic acid (VITAMIN C) 1000 MG tablet Take 1,000 mg by mouth daily.     aspirin EC 81 MG tablet Take 81 mg by mouth daily.     celecoxib (CELEBREX) 200 MG capsule Take 200 mg by mouth daily.     clopidogrel (PLAVIX) 75 MG tablet Take 1 tablet (75 mg total) by mouth daily. 30 tablet 11   cyanocobalamin 1000 MCG tablet Take by mouth.     Docusate Calcium (STOOL SOFTENER PO) Take by mouth in the morning and at bedtime.     empagliflozin (JARDIANCE) 25 MG TABS tablet Take 25 mg by mouth daily.     furosemide (LASIX) 20 MG tablet Take 20 mg by mouth daily.     gabapentin (NEURONTIN) 100 MG capsule Take 100 mg by mouth 2 (two) times daily.     glipiZIDE (GLUCOTROL XL) 5 MG 24 hr tablet Take 5 mg by mouth 2 (two) times daily.     liraglutide (VICTOZA) 18 MG/3ML SOPN Inject into the skin.      lisinopril (ZESTRIL) 20 MG tablet Take 20 mg by mouth daily.     lisinopril-hydrochlorothiazide (PRINZIDE,ZESTORETIC) 20-25 MG per tablet Take 1 tablet by mouth daily.     metFORMIN (GLUCOPHAGE) 1000 MG tablet Take 1,000 mg by mouth 2 (two) times daily with a meal.     methocarbamol (ROBAXIN) 500 MG tablet Take 1 tablet (500 mg total) by mouth 4 (four) times daily. 20 tablet 0   metoprolol succinate (TOPROL-XL) 25 MG 24 hr tablet Take 1 tablet by mouth daily.     mupirocin ointment (BACTROBAN) 2 % Place 1 application on wound daily 22 g 0   ONETOUCH VERIO test strip 1 each 2 (two) times daily.     OZEMPIC, 0.25 OR 0.5 MG/DOSE, 2 MG/1.5ML SOPN Inject into the skin.     pregabalin (LYRICA) 25 MG capsule Take 50 mg by mouth 3 (three) times daily.     rosuvastatin (CRESTOR) 5 MG tablet Take 5 mg by mouth daily.     Semaglutide (RYBELSUS) 7 MG TABS Take 7 mg by mouth daily.     No current facility-administered medications on file prior to visit.    There are no Patient Instructions on file for this visit. No follow-ups on file.   Georgiana Spinner, NP

## 2022-08-04 ENCOUNTER — Ambulatory Visit (INDEPENDENT_AMBULATORY_CARE_PROVIDER_SITE_OTHER): Payer: Medicare HMO | Admitting: Nurse Practitioner

## 2022-08-06 ENCOUNTER — Encounter (INDEPENDENT_AMBULATORY_CARE_PROVIDER_SITE_OTHER): Payer: Self-pay | Admitting: Nurse Practitioner

## 2022-08-06 ENCOUNTER — Ambulatory Visit (INDEPENDENT_AMBULATORY_CARE_PROVIDER_SITE_OTHER): Payer: Medicare HMO | Admitting: Nurse Practitioner

## 2022-08-06 VITALS — BP 124/69 | HR 71 | Resp 16

## 2022-08-06 DIAGNOSIS — L97211 Non-pressure chronic ulcer of right calf limited to breakdown of skin: Secondary | ICD-10-CM | POA: Diagnosis not present

## 2022-08-06 NOTE — Progress Notes (Signed)
History of Present Illness  There is no documented history at this time  Assessments & Plan   There are no diagnoses linked to this encounter.    Additional instructions  Subjective:  Patient presents with venous ulcer of the Bilateral lower extremity.    Procedure:  3 layer unna wrap was placed Bilateral lower extremity.   Plan:   Follow up in one week.  

## 2022-08-12 ENCOUNTER — Ambulatory Visit (INDEPENDENT_AMBULATORY_CARE_PROVIDER_SITE_OTHER): Payer: Medicare HMO | Admitting: Nurse Practitioner

## 2022-08-12 ENCOUNTER — Encounter (INDEPENDENT_AMBULATORY_CARE_PROVIDER_SITE_OTHER): Payer: Self-pay

## 2022-08-12 VITALS — BP 130/68 | HR 75 | Resp 16

## 2022-08-12 DIAGNOSIS — L97211 Non-pressure chronic ulcer of right calf limited to breakdown of skin: Secondary | ICD-10-CM | POA: Diagnosis not present

## 2022-08-12 NOTE — Progress Notes (Signed)
History of Present Illness  There is no documented history at this time  Assessments & Plan   There are no diagnoses linked to this encounter.    Additional instructions  Subjective:  Patient presents with venous ulcer of the Bilateral lower extremity.    Procedure:  3 layer unna wrap was placed Bilateral lower extremity.   Plan:   Follow up in one week.  

## 2022-08-19 ENCOUNTER — Ambulatory Visit (INDEPENDENT_AMBULATORY_CARE_PROVIDER_SITE_OTHER): Payer: Medicare HMO | Admitting: Nurse Practitioner

## 2022-08-19 ENCOUNTER — Encounter (INDEPENDENT_AMBULATORY_CARE_PROVIDER_SITE_OTHER): Payer: Self-pay

## 2022-08-19 VITALS — BP 135/69 | HR 69 | Resp 16

## 2022-08-19 DIAGNOSIS — L97211 Non-pressure chronic ulcer of right calf limited to breakdown of skin: Secondary | ICD-10-CM | POA: Diagnosis not present

## 2022-08-19 NOTE — Progress Notes (Unsigned)
History of Present Illness  There is no documented history at this time  Assessments & Plan   There are no diagnoses linked to this encounter.    Additional instructions  Subjective:  Patient presents with venous ulcer of the Right lower extremity.    Procedure:  3 layer unna wrap was placed Right lower extremity.   Plan:   Follow up in one week.   

## 2022-08-20 ENCOUNTER — Encounter (INDEPENDENT_AMBULATORY_CARE_PROVIDER_SITE_OTHER): Payer: Self-pay | Admitting: Nurse Practitioner

## 2022-08-26 ENCOUNTER — Ambulatory Visit (INDEPENDENT_AMBULATORY_CARE_PROVIDER_SITE_OTHER): Payer: Medicare HMO | Admitting: Nurse Practitioner

## 2022-08-26 ENCOUNTER — Encounter (INDEPENDENT_AMBULATORY_CARE_PROVIDER_SITE_OTHER): Payer: Self-pay | Admitting: Nurse Practitioner

## 2022-08-26 VITALS — BP 136/60 | HR 66 | Resp 15

## 2022-08-26 DIAGNOSIS — L97221 Non-pressure chronic ulcer of left calf limited to breakdown of skin: Secondary | ICD-10-CM | POA: Diagnosis not present

## 2022-08-26 DIAGNOSIS — L97211 Non-pressure chronic ulcer of right calf limited to breakdown of skin: Secondary | ICD-10-CM

## 2022-08-26 NOTE — Progress Notes (Signed)
History of Present Illness  There is no documented history at this time  Assessments & Plan       Additional instructions  Subjective:  Patient presents with venous ulcer of the Left lower extremity.    Procedure:  3 layer unna wrap was placed Left lower extremity.   Plan:   Follow up in one week.

## 2022-09-02 ENCOUNTER — Encounter (INDEPENDENT_AMBULATORY_CARE_PROVIDER_SITE_OTHER): Payer: Self-pay | Admitting: Nurse Practitioner

## 2022-09-02 ENCOUNTER — Ambulatory Visit (INDEPENDENT_AMBULATORY_CARE_PROVIDER_SITE_OTHER): Payer: Medicare HMO | Admitting: Nurse Practitioner

## 2022-09-02 VITALS — BP 128/63 | HR 66 | Resp 15

## 2022-09-02 DIAGNOSIS — I890 Lymphedema, not elsewhere classified: Secondary | ICD-10-CM | POA: Diagnosis not present

## 2022-09-02 DIAGNOSIS — L97211 Non-pressure chronic ulcer of right calf limited to breakdown of skin: Secondary | ICD-10-CM | POA: Diagnosis not present

## 2022-09-02 DIAGNOSIS — I10 Essential (primary) hypertension: Secondary | ICD-10-CM | POA: Diagnosis not present

## 2022-09-02 NOTE — Progress Notes (Signed)
Subjective:    Patient ID: [name], female    DOB: [birth year], 77 y.o.   MRN: [id number] Chief Complaint  Patient presents with   Follow-up    Unna follow up    Patient was a 77 year old female who returns today for evaluation of her bilateral lower extremity wounds.  Initially it was present on the right lower extremity.  That has healed and she has been out of Unna boots for approximately 1 week.  That lower extremity is looking improved with no recurrence of ulceration.  Her left lower extremity did not have any wounds but she did have significant edema.  Following several weeks of Unna wraps the swelling and the left lower extremity is much improved.  Based on this the patient is able to be removed from [employer] today.    Review of Systems  Cardiovascular:  Positive for leg swelling.  Musculoskeletal:  Positive for gait problem.  Skin:  Negative for wound.  All other systems reviewed and are negative.      Objective:   Physical Exam Vitals reviewed.  HENT:     Head: Normocephalic.  Cardiovascular:     Rate and Rhythm: Normal rate.  Pulmonary:     Effort: Pulmonary effort is normal.  Musculoskeletal:     Right lower leg: Edema present.     Left lower leg: Edema present.  Skin:    General: Skin is warm and dry.  Neurological:     Mental Status: She is alert and oriented to person, place, and time.  Psychiatric:        Mood and Affect: Mood normal.        Behavior: Behavior normal.        Thought Content: Thought content normal.        Judgment: Judgment normal.     BP 128/63 (BP Location: Right Arm)   Pulse 66   Resp 15   Past Medical History:  Diagnosis Date   Anemia    as young child   Arthritis    back, leg and foot   Blood transfusion    1964   Carpal tunnel syndrome    Diabetes mellitus    oral medications   Hypertension     Social History   Socioeconomic History   Marital status: Married    Spouse name: Not on file   Number of  children: Not on file   Years of education: Not on file   Highest education level: Not on file  Occupational History   Not on file  Tobacco Use   Smoking status: Never   Smokeless tobacco: Never  Substance and Sexual Activity   Alcohol use: No   Drug use: No   Sexual activity: Not on file  Other Topics Concern   Not on file  Social History Narrative   Not on file   Social Determinants of Health   Financial Resource Strain: Not on file  Food Insecurity: Not on file  Transportation Needs: Not on file  Physical Activity: Not on file  Stress: Not on file  Social Connections: Not on file  Intimate Partner Violence: Not on file    Past Surgical History:  Procedure Laterality Date   ABDOMINAL HYSTERECTOMY     ANTERIOR CERVICAL DECOMP/DISCECTOMY FUSION  04/27/2011   Procedure: ANTERIOR CERVICAL DECOMPRESSION/DISCECTOMY FUSION 2 LEVELS;  Surgeon: Mariam Dollar, MD;  Location: [facility];  Service: Neurosurgery;  Laterality: N/A;  Anterior Cervical Decompression/ Discectomy Fusion Cervical  Three-Four, Cervical Four-Five   APPENDECTOMY     KNEE ARTHROPLASTY     right   PERIPHERAL VASCULAR CATHETERIZATION Left 10/29/2014   Procedure: Lower Extremity Angiography;  Surgeon: Annice Needy, MD;  Location: [facility];  Service: Cardiovascular;  Laterality: Left;   PERIPHERAL VASCULAR CATHETERIZATION Left 10/29/2014   Procedure: Lower Extremity Intervention;  Surgeon: Annice Needy, MD;  Location: [facility];  Service: Cardiovascular;  Laterality: Left;   PERIPHERAL VASCULAR CATHETERIZATION Left 03/11/2015   Procedure: Lower Extremity Angiography;  Surgeon: Annice Needy, MD;  Location: [facility];  Service: Cardiovascular;  Laterality: Left;   PERIPHERAL VASCULAR CATHETERIZATION Left 03/11/2015   Procedure: Lower Extremity Intervention;  Surgeon: Annice Needy, MD;  Location: [facility];  Service: Cardiovascular;  Laterality: Left;    Family History  Problem  Relation Age of Onset   Cancer Mother    Hypertension Mother    Diabetes Paternal Grandmother     Allergies  Allergen Reactions   Penicillins Shortness Of Breath and Swelling   Cheese Itching   Codeine Other (See Comments)    Ringing in ear TOLERATES Tramadol Other reaction(s): Other (See Comments) RINGING IN THE EARS Ringing in ear TOLERATES Tramadol   Strawberry (Diagnostic) Hives   Strawberry Extract Hives       Latest Ref Rng & Units 06/09/2016    6:32 PM 08/10/2013    1:15 PM 04/22/2011    1:37 PM  CBC  WBC 3.6 - 11.0 K/uL 2.3  4.0  8.2   Hemoglobin 12.0 - 16.0 g/dL 56.2  13.0  86.5   Hematocrit 35.0 - 47.0 % 35.9  36.1  37.0   Platelets 150 - 440 K/uL 151  196  228       CMP     Component Value Date/Time   NA 139 06/09/2016 1832   NA 139 08/10/2013 1315   K 3.4 (L) 06/09/2016 1832   K 3.7 08/10/2013 1315   CL 100 (L) 06/09/2016 1832   CL 105 08/10/2013 1315   CO2 29 06/09/2016 1832   CO2 28 08/10/2013 1315   GLUCOSE 295 (H) 06/09/2016 1832   GLUCOSE 310 (H) 08/10/2013 1315   BUN 27 (H) 06/09/2016 1832   BUN 15 08/10/2013 1315   CREATININE 1.65 (H) 06/09/2016 1832   CREATININE 1.15 08/10/2013 1315   CALCIUM 8.3 (L) 06/09/2016 1832   CALCIUM 8.8 08/10/2013 1315   PROT 6.8 06/09/2016 1832   ALBUMIN 3.4 (L) 06/09/2016 1832   AST 41 06/09/2016 1832   ALT 28 06/09/2016 1832   ALKPHOS 73 06/09/2016 1832   BILITOT 0.4 06/09/2016 1832   GFRNONAA 30 (L) 06/09/2016 1832   GFRNONAA 49 (L) 08/10/2013 1315     No results found.     Assessment & Plan:   1. Lower limb ulcer, calf, right, limited to breakdown of skin [facility]) This is currently healed.  Patient will continue with conservative therapy of utilizing her medical grade compression socks and elevation and activity as possible.  Will also look into getting a lymphedema pump for her as I believe that this will also do very well with helping improve her overall control.  2. Primary hypertension Continue  antihypertensive medications as already ordered, these medications have been reviewed and there are no changes at this time.  3. Lymphedema Recommend:  No surgery or intervention at this point in time.   The Patient is CEAP [id number].  The patient has  been wearing compression for more than 12 weeks with no or little benefit.  The patient has been exercising daily for more than 12 weeks. The patient has been elevating and taking OTC pain medications for more than 12 weeks.  None of these have have eliminated the pain related to the lymphedema or the discomfort regarding excessive swelling and venous congestion.    I have reviewed my discussion with the patient regarding lymphedema and why it  causes symptoms.  Patient will continue wearing graduated compression on a daily basis. The patient should put the compression on first thing in the morning and removing them in the evening. The patient should not sleep in the compression.   In addition, behavioral modification throughout the day will be continued.  This will include frequent elevation (such as in a recliner), use of over the counter pain medications as needed and exercise such as walking.  The systemic causes for chronic edema such as liver, kidney and cardiac etiologies do not appear to have significant changed over the past year.    The patient has chronic , severe lymphedema with hyperpigmentation of the skin and has done MLD, skin care, medication, diet, exercise, elevation and compression for 4 weeks with no improvement,  I am recommending a lymphedema pump.  The patient still has stage 3 lymphedema and therefore, I believe that a lymph pump is needed to improve the control of the patient's lymphedema and improve the quality of life.  Additionally, a lymph pump is warranted because it will reduce the risk of cellulitis and ulceration in the future.  Patient should follow-up in six months    Current Outpatient Medications on File Prior  to Visit  Medication Sig Dispense Refill   ascorbic acid (VITAMIN C) 1000 MG tablet Take 1,000 mg by mouth daily.     aspirin EC 81 MG tablet Take 81 mg by mouth daily.     celecoxib (CELEBREX) 200 MG capsule Take 200 mg by mouth daily.     ciprofloxacin (CIPRO) 250 MG tablet Take 3 tablets (750 mg total) by mouth 2 (two) times daily. 60 tablet 0   clopidogrel (PLAVIX) 75 MG tablet Take 1 tablet (75 mg total) by mouth daily. 30 tablet 11   cyanocobalamin 1000 MCG tablet Take by mouth.     Docusate Calcium (STOOL SOFTENER PO) Take by mouth in the morning and at bedtime.     empagliflozin (JARDIANCE) 25 MG TABS tablet Take 25 mg by mouth daily.     furosemide (LASIX) 20 MG tablet Take 20 mg by mouth daily.     gabapentin (NEURONTIN) 100 MG capsule Take 100 mg by mouth 2 (two) times daily.     glipiZIDE (GLUCOTROL XL) 5 MG 24 hr tablet Take 5 mg by mouth 2 (two) times daily.     liraglutide (VICTOZA) 18 MG/3ML SOPN Inject into the skin.     lisinopril (ZESTRIL) 20 MG tablet Take 20 mg by mouth daily.     lisinopril-hydrochlorothiazide (PRINZIDE,ZESTORETIC) 20-25 MG per tablet Take 1 tablet by mouth daily.     metFORMIN (GLUCOPHAGE) 1000 MG tablet Take 1,000 mg by mouth 2 (two) times daily with a meal.     methocarbamol (ROBAXIN) 500 MG tablet Take 1 tablet (500 mg total) by mouth 4 (four) times daily. 20 tablet 0   metoprolol succinate (TOPROL-XL) 25 MG 24 hr tablet Take 1 tablet by mouth daily.     mupirocin ointment (BACTROBAN) 2 % Place 1 application on wound  daily 22 g 0   ONETOUCH VERIO test strip 1 each 2 (two) times daily.     OZEMPIC, 0.25 OR 0.5 MG/DOSE, 2 MG/1.5ML SOPN Inject into the skin.     pregabalin (LYRICA) 25 MG capsule Take 50 mg by mouth 3 (three) times daily.     rosuvastatin (CRESTOR) 5 MG tablet Take 5 mg by mouth daily.     Semaglutide (RYBELSUS) 7 MG TABS Take 7 mg by mouth daily.     traMADol (ULTRAM) 50 MG tablet Take 1 tablet (50 mg total) by mouth every 6 (six)  hours as needed. 20 tablet 0   No current facility-administered medications on file prior to visit.    There are no Patient Instructions on file for this visit. No follow-ups on file.   Georgiana Spinner, NP

## 2022-10-28 ENCOUNTER — Encounter (INDEPENDENT_AMBULATORY_CARE_PROVIDER_SITE_OTHER): Payer: Self-pay | Admitting: Nurse Practitioner

## 2022-10-28 ENCOUNTER — Ambulatory Visit (INDEPENDENT_AMBULATORY_CARE_PROVIDER_SITE_OTHER): Payer: Medicare HMO | Admitting: Nurse Practitioner

## 2022-10-28 VITALS — BP 157/72 | HR 68 | Resp 16

## 2022-10-28 DIAGNOSIS — E11622 Type 2 diabetes mellitus with other skin ulcer: Secondary | ICD-10-CM

## 2022-10-28 DIAGNOSIS — I10 Essential (primary) hypertension: Secondary | ICD-10-CM | POA: Diagnosis not present

## 2022-10-28 DIAGNOSIS — L98499 Non-pressure chronic ulcer of skin of other sites with unspecified severity: Secondary | ICD-10-CM

## 2022-10-28 DIAGNOSIS — I890 Lymphedema, not elsewhere classified: Secondary | ICD-10-CM

## 2022-10-29 ENCOUNTER — Encounter (INDEPENDENT_AMBULATORY_CARE_PROVIDER_SITE_OTHER): Payer: Self-pay | Admitting: Nurse Practitioner

## 2022-10-29 NOTE — Progress Notes (Addendum)
Subjective:    Patient ID: [name], female    DOB: 07/28/1945, 77 y.o.   MRN: [id number] Chief Complaint  Patient presents with   Follow-up    6-8 week follow up    The patient returns today for follow-up evaluation of her lymphedema.  She has been using [place] wraps consistently and there has been no development or wounds or blisters or ulcerations.  She has been working with a company for a lymphedema pump.    Addendum 12/30/2022: the patient has truncal and abdominal swelling.  She has filled with the use of a basic pump.  Her basic pump trial was completed home on 09/29/2022 using [id number] for 15 minutes at 30 mmHg.  She complained of pain and discomfort during the basic pump trial as well as increased truncal edema upon completion of the short trial.    Review of Systems  Cardiovascular:  Positive for leg swelling.  Neurological:  Positive for weakness.  All other systems reviewed and are negative.      Objective:   Physical Exam Vitals reviewed.  HENT:     Head: Normocephalic.  Cardiovascular:     Rate and Rhythm: Normal rate.  Pulmonary:     Effort: Pulmonary effort is normal.  Musculoskeletal:     Right lower leg: Edema present.     Left lower leg: Edema present.  Skin:    General: Skin is warm and dry.     Comments: Hyperpigmentation  Neurological:     Mental Status: She is alert and oriented to person, place, and time.  Psychiatric:        Mood and Affect: Mood normal.        Behavior: Behavior normal.        Thought Content: Thought content normal.        Judgment: Judgment normal.     BP (!) 157/72 (BP Location: Right Arm)   Pulse 68   Resp 16   Past Medical History:  Diagnosis Date   Anemia    as young child   Arthritis    back, leg and foot   Blood transfusion    1964   Carpal tunnel syndrome    Diabetes mellitus    oral medications   Hypertension     Social History   Socioeconomic History   Marital status: Married    Spouse name:  Not on file   Number of children: Not on file   Years of education: Not on file   Highest education level: Not on file  Occupational History   Not on file  Tobacco Use   Smoking status: Never   Smokeless tobacco: Never  Substance and Sexual Activity   Alcohol use: No   Drug use: No   Sexual activity: Not on file  Other Topics Concern   Not on file  Social History Narrative   Not on file   Social Determinants of Health   Financial Resource Strain: Not on file  Food Insecurity: Not on file  Transportation Needs: Not on file  Physical Activity: Not on file  Stress: Not on file  Social Connections: Not on file  Intimate Partner Violence: Not on file    Past Surgical History:  Procedure Laterality Date   ABDOMINAL HYSTERECTOMY     ANTERIOR CERVICAL DECOMP/DISCECTOMY FUSION  04/27/2011   Procedure: ANTERIOR CERVICAL DECOMPRESSION/DISCECTOMY FUSION 2 LEVELS;  Surgeon: Mariam Dollar, MD;  Location: [facility];  Service: Neurosurgery;  Laterality: N/A;  Anterior Cervical Decompression/ Discectomy Fusion Cervical Three-Four, Cervical Four-Five   APPENDECTOMY     KNEE ARTHROPLASTY     right   PERIPHERAL VASCULAR CATHETERIZATION Left 10/29/2014   Procedure: Lower Extremity Angiography;  Surgeon: Annice Needy, MD;  Location: [facility];  Service: Cardiovascular;  Laterality: Left;   PERIPHERAL VASCULAR CATHETERIZATION Left 10/29/2014   Procedure: Lower Extremity Intervention;  Surgeon: Annice Needy, MD;  Location: [facility];  Service: Cardiovascular;  Laterality: Left;   PERIPHERAL VASCULAR CATHETERIZATION Left 03/11/2015   Procedure: Lower Extremity Angiography;  Surgeon: Annice Needy, MD;  Location: [facility];  Service: Cardiovascular;  Laterality: Left;   PERIPHERAL VASCULAR CATHETERIZATION Left 03/11/2015   Procedure: Lower Extremity Intervention;  Surgeon: Annice Needy, MD;  Location: [facility];  Service: Cardiovascular;  Laterality: Left;     Family History  Problem Relation Age of Onset   Cancer Mother    Hypertension Mother    Diabetes Paternal Grandmother     Allergies  Allergen Reactions   Penicillins Shortness Of Breath and Swelling   Cheese Itching   Codeine Other (See Comments)    Ringing in ear TOLERATES Tramadol Other reaction(s): Other (See Comments) RINGING IN THE EARS Ringing in ear TOLERATES Tramadol   Strawberry (Diagnostic) Hives   Strawberry Extract Hives       Latest Ref Rng & Units 06/09/2016    6:32 PM 08/10/2013    1:15 PM 04/22/2011    1:37 PM  CBC  WBC 3.6 - 11.0 K/uL 2.3  4.0  8.2   Hemoglobin 12.0 - 16.0 g/dL 95.2  84.1  32.4   Hematocrit 35.0 - 47.0 % 35.9  36.1  37.0   Platelets 150 - 440 K/uL 151  196  228       CMP     Component Value Date/Time   NA 139 06/09/2016 1832   NA 139 08/10/2013 1315   K 3.4 (L) 06/09/2016 1832   K 3.7 08/10/2013 1315   CL 100 (L) 06/09/2016 1832   CL 105 08/10/2013 1315   CO2 29 06/09/2016 1832   CO2 28 08/10/2013 1315   GLUCOSE 295 (H) 06/09/2016 1832   GLUCOSE 310 (H) 08/10/2013 1315   BUN 27 (H) 06/09/2016 1832   BUN 15 08/10/2013 1315   CREATININE 1.65 (H) 06/09/2016 1832   CREATININE 1.15 08/10/2013 1315   CALCIUM 8.3 (L) 06/09/2016 1832   CALCIUM 8.8 08/10/2013 1315   PROT 6.8 06/09/2016 1832   ALBUMIN 3.4 (L) 06/09/2016 1832   AST 41 06/09/2016 1832   ALT 28 06/09/2016 1832   ALKPHOS 73 06/09/2016 1832   BILITOT 0.4 06/09/2016 1832   GFRNONAA 30 (L) 06/09/2016 1832   GFRNONAA 49 (L) 08/10/2013 1315     No results found.     Assessment & Plan:   1. Lymphedema Currently patient is doing well adhering to conservative therapy.  She is advised to continue with this.  I am hopeful that she will receive her lymphedema pumps and I think this will certainly help to maintain her overall lower extremity edema, as well as increase the occurrence of cellulitis and ulcerations and wounds.  They are advised to follow-up with [place] sooner  if issues arise but will bring her back in 6 months.  2. Primary hypertension Continue antihypertensive medications as already ordered, these medications have been reviewed and there are no changes at this time.  3. Type 2 diabetes mellitus  with other skin ulcer, unspecified whether long term insulin use (HCC) Continue hypoglycemic medications as already ordered, these medications have been reviewed and there are no changes at this time.  Hgb A1C to be monitored as already arranged by primary service   Current Outpatient Medications on File Prior to Visit  Medication Sig Dispense Refill   ascorbic acid (VITAMIN C) 1000 MG tablet Take 1,000 mg by mouth daily.     aspirin EC 81 MG tablet Take 81 mg by mouth daily.     celecoxib (CELEBREX) 200 MG capsule Take 200 mg by mouth daily.     ciprofloxacin (CIPRO) 250 MG tablet Take 3 tablets (750 mg total) by mouth 2 (two) times daily. 60 tablet 0   clopidogrel (PLAVIX) 75 MG tablet Take 1 tablet (75 mg total) by mouth daily. 30 tablet 11   cyanocobalamin 1000 MCG tablet Take by mouth.     Docusate Calcium (STOOL SOFTENER PO) Take by mouth in the morning and at bedtime.     empagliflozin (JARDIANCE) 25 MG TABS tablet Take 25 mg by mouth daily.     furosemide (LASIX) 20 MG tablet Take 20 mg by mouth daily.     gabapentin (NEURONTIN) 100 MG capsule Take 100 mg by mouth 2 (two) times daily.     glipiZIDE (GLUCOTROL XL) 5 MG 24 hr tablet Take 5 mg by mouth 2 (two) times daily.     liraglutide (VICTOZA) 18 MG/3ML SOPN Inject into the skin.     lisinopril (ZESTRIL) 20 MG tablet Take 20 mg by mouth daily.     lisinopril-hydrochlorothiazide (PRINZIDE,ZESTORETIC) 20-25 MG per tablet Take 1 tablet by mouth daily.     metFORMIN (GLUCOPHAGE) 1000 MG tablet Take 1,000 mg by mouth 2 (two) times daily with a meal.     methocarbamol (ROBAXIN) 500 MG tablet Take 1 tablet (500 mg total) by mouth 4 (four) times daily. 20 tablet 0   metoprolol succinate (TOPROL-XL)  25 MG 24 hr tablet Take 1 tablet by mouth daily.     mupirocin ointment (BACTROBAN) 2 % Place 1 application on wound daily 22 g 0   ONETOUCH VERIO test strip 1 each 2 (two) times daily.     OZEMPIC, 0.25 OR 0.5 MG/DOSE, 2 MG/1.5ML SOPN Inject into the skin.     pregabalin (LYRICA) 25 MG capsule Take 50 mg by mouth 3 (three) times daily.     rosuvastatin (CRESTOR) 5 MG tablet Take 5 mg by mouth daily.     Semaglutide (RYBELSUS) 7 MG TABS Take 7 mg by mouth daily.     traMADol (ULTRAM) 50 MG tablet Take 1 tablet (50 mg total) by mouth every 6 (six) hours as needed. 20 tablet 0   No current facility-administered medications on file prior to visit.    There are no Patient Instructions on file for this visit. No follow-ups on file.   Georgiana Spinner, NP

## 2023-04-28 ENCOUNTER — Ambulatory Visit (INDEPENDENT_AMBULATORY_CARE_PROVIDER_SITE_OTHER): Payer: Medicare HMO | Admitting: Nurse Practitioner

## 2023-04-28 ENCOUNTER — Encounter (INDEPENDENT_AMBULATORY_CARE_PROVIDER_SITE_OTHER): Payer: Self-pay | Admitting: Nurse Practitioner

## 2023-04-28 VITALS — BP 158/65 | HR 67 | Resp 16

## 2023-04-28 DIAGNOSIS — L98499 Non-pressure chronic ulcer of skin of other sites with unspecified severity: Secondary | ICD-10-CM | POA: Diagnosis not present

## 2023-04-28 DIAGNOSIS — I10 Essential (primary) hypertension: Secondary | ICD-10-CM

## 2023-04-28 DIAGNOSIS — I890 Lymphedema, not elsewhere classified: Secondary | ICD-10-CM | POA: Diagnosis not present

## 2023-04-28 DIAGNOSIS — E11622 Type 2 diabetes mellitus with other skin ulcer: Secondary | ICD-10-CM | POA: Diagnosis not present

## 2023-04-28 NOTE — Progress Notes (Signed)
 Subjective:    Patient ID: [name], female    DOB: [birth year], 78 y.o.   MRN: [id number] Chief Complaint  Patient presents with   Follow-up    6 month follow up    The patient returns to the office for followup evaluation regarding leg swelling.  The swelling has persisted but with the lymph pump is under much, much better controlled. The pain associated with swelling is decreased. There have not been any interval development of a ulcerations or wounds.  The patient denies problems with the pump, noting it is working well and the leggings are in good condition.  Since the previous visit the patient has been wearing graduated compression stockings and using the lymph pump on a routine basis and  has noted significant improvement in the lymphedema.   Patient stated the lymph pump has been helpful with the treatment of the lymphedema.      Review of Systems  All other systems reviewed and are negative.      Objective:   Physical Exam Vitals reviewed.  HENT:     Head: Normocephalic.  Cardiovascular:     Rate and Rhythm: Normal rate.  Pulmonary:     Effort: Pulmonary effort is normal.  Musculoskeletal:     Right lower leg: No edema.     Left lower leg: No edema.  Skin:    General: Skin is warm and dry.  Neurological:     Mental Status: She is alert and oriented to person, place, and time.  Psychiatric:        Mood and Affect: Mood normal.        Behavior: Behavior normal.        Thought Content: Thought content normal.        Judgment: Judgment normal.     BP (!) 158/65   Pulse 67   Resp 16   Past Medical History:  Diagnosis Date   Anemia    as young child   Arthritis    back, leg and foot   Blood transfusion    1964   Carpal tunnel syndrome    Diabetes mellitus    oral medications   Hypertension     Social History   Socioeconomic History   Marital status: Married    Spouse name: Not on file   Number of children: Not on file   Years of  education: Not on file   Highest education level: Not on file  Occupational History   Not on file  Tobacco Use   Smoking status: Never   Smokeless tobacco: Never  Substance and Sexual Activity   Alcohol use: No   Drug use: No   Sexual activity: Not on file  Other Topics Concern   Not on file  Social History Narrative   Not on file   Social Drivers of Health   Financial Resource Strain: Not on file  Food Insecurity: Not on file  Transportation Needs: Not on file  Physical Activity: Not on file  Stress: Not on file  Social Connections: Not on file  Intimate Partner Violence: Not on file    Past Surgical History:  Procedure Laterality Date   ABDOMINAL HYSTERECTOMY     ANTERIOR CERVICAL DECOMP/DISCECTOMY FUSION  04/27/2011   Procedure: ANTERIOR CERVICAL DECOMPRESSION/DISCECTOMY FUSION 2 LEVELS;  Surgeon: Mariam Dollar, MD;  Location: [facility];  Service: Neurosurgery;  Laterality: N/A;  Anterior Cervical Decompression/ Discectomy Fusion Cervical Three-Four, Cervical Four-Five   APPENDECTOMY  KNEE ARTHROPLASTY     right   PERIPHERAL VASCULAR CATHETERIZATION Left 10/29/2014   Procedure: Lower Extremity Angiography;  Surgeon: Annice Needy, MD;  Location: [facility];  Service: Cardiovascular;  Laterality: Left;   PERIPHERAL VASCULAR CATHETERIZATION Left 10/29/2014   Procedure: Lower Extremity Intervention;  Surgeon: Annice Needy, MD;  Location: [facility];  Service: Cardiovascular;  Laterality: Left;   PERIPHERAL VASCULAR CATHETERIZATION Left 03/11/2015   Procedure: Lower Extremity Angiography;  Surgeon: Annice Needy, MD;  Location: [facility];  Service: Cardiovascular;  Laterality: Left;   PERIPHERAL VASCULAR CATHETERIZATION Left 03/11/2015   Procedure: Lower Extremity Intervention;  Surgeon: Annice Needy, MD;  Location: [facility];  Service: Cardiovascular;  Laterality: Left;    Family History  Problem Relation Age of Onset   Cancer Mother     Hypertension Mother    Diabetes Paternal Grandmother     Allergies  Allergen Reactions   Penicillins Shortness Of Breath and Swelling   Cheese Itching   Codeine Other (See Comments)    Ringing in ear TOLERATES Tramadol Other reaction(s): Other (See Comments) RINGING IN THE EARS Ringing in ear TOLERATES Tramadol   Strawberry (Diagnostic) Hives   Strawberry Extract Hives       Latest Ref Rng & Units 06/09/2016    6:32 PM 08/10/2013    1:15 PM 04/22/2011    1:37 PM  CBC  WBC 3.6 - 11.0 K/uL 2.3  4.0  8.2   Hemoglobin 12.0 - 16.0 g/dL 52.8  41.3  24.4   Hematocrit 35.0 - 47.0 % 35.9  36.1  37.0   Platelets 150 - 440 K/uL 151  196  228       CMP     Component Value Date/Time   NA 139 06/09/2016 1832   NA 139 08/10/2013 1315   K 3.4 (L) 06/09/2016 1832   K 3.7 08/10/2013 1315   CL 100 (L) 06/09/2016 1832   CL 105 08/10/2013 1315   CO2 29 06/09/2016 1832   CO2 28 08/10/2013 1315   GLUCOSE 295 (H) 06/09/2016 1832   GLUCOSE 310 (H) 08/10/2013 1315   BUN 27 (H) 06/09/2016 1832   BUN 15 08/10/2013 1315   CREATININE 1.65 (H) 06/09/2016 1832   CREATININE 1.15 08/10/2013 1315   CALCIUM 8.3 (L) 06/09/2016 1832   CALCIUM 8.8 08/10/2013 1315   PROT 6.8 06/09/2016 1832   ALBUMIN 3.4 (L) 06/09/2016 1832   AST 41 06/09/2016 1832   ALT 28 06/09/2016 1832   ALKPHOS 73 06/09/2016 1832   BILITOT 0.4 06/09/2016 1832   GFRNONAA 30 (L) 06/09/2016 1832   GFRNONAA 49 (L) 08/10/2013 1315     No results found.     Assessment & Plan:   1. Lymphedema (Primary) Recommend:  No surgery or intervention at this point in time.    I have reviewed my discussion with the patient regarding lymphedema and why it  causes symptoms.  Patient will continue wearing graduated compression on a daily basis. The patient should put the compression on first thing in the morning and removing them in the evening. The patient should not sleep in the compression.   In addition, behavioral modification  throughout the day will be continued.  This will include frequent elevation (such as in a recliner), use of over the counter pain medications as needed and exercise such as walking.  The systemic causes for chronic edema such as liver, kidney and cardiac etiologies  does not appear to have significant changed over the past year.    The patient will continue aggressive use of the  lymph pump.  This will continue to improve the edema control and prevent sequela such as ulcers and infections.   The patient will follow-up in 6 months  2. Essential hypertension, benign Continue antihypertensive medications as already ordered, these medications have been reviewed and there are no changes at this time.  3. Type 2 diabetes mellitus with other skin ulcer, unspecified whether long term insulin use (HCC) Continue hypoglycemic medications as already ordered, these medications have been reviewed and there are no changes at this time.  Hgb A1C to be monitored as already arranged by primary service   Current Outpatient Medications on File Prior to Visit  Medication Sig Dispense Refill   ascorbic acid (VITAMIN C) 1000 MG tablet Take 1,000 mg by mouth daily.     aspirin EC 81 MG tablet Take 81 mg by mouth daily.     celecoxib (CELEBREX) 200 MG capsule Take 200 mg by mouth daily.     ciprofloxacin (CIPRO) 250 MG tablet Take 3 tablets (750 mg total) by mouth 2 (two) times daily. 60 tablet 0   clopidogrel (PLAVIX) 75 MG tablet Take 1 tablet (75 mg total) by mouth daily. 30 tablet 11   cyanocobalamin 1000 MCG tablet Take by mouth.     Docusate Calcium (STOOL SOFTENER PO) Take by mouth in the morning and at bedtime.     empagliflozin (JARDIANCE) 25 MG TABS tablet Take 25 mg by mouth daily.     furosemide (LASIX) 20 MG tablet Take 20 mg by mouth daily.     gabapentin (NEURONTIN) 100 MG capsule Take 100 mg by mouth 2 (two) times daily.     glipiZIDE (GLUCOTROL XL) 5 MG 24 hr tablet Take 5 mg by mouth 2 (two)  times daily.     liraglutide (VICTOZA) 18 MG/3ML SOPN Inject into the skin.     lisinopril (ZESTRIL) 20 MG tablet Take 20 mg by mouth daily.     lisinopril-hydrochlorothiazide (PRINZIDE,ZESTORETIC) 20-25 MG per tablet Take 1 tablet by mouth daily.     metFORMIN (GLUCOPHAGE) 1000 MG tablet Take 1,000 mg by mouth 2 (two) times daily with a meal.     methocarbamol (ROBAXIN) 500 MG tablet Take 1 tablet (500 mg total) by mouth 4 (four) times daily. 20 tablet 0   metoprolol succinate (TOPROL-XL) 25 MG 24 hr tablet Take 1 tablet by mouth daily.     mupirocin ointment (BACTROBAN) 2 % Place 1 application on wound daily 22 g 0   ONETOUCH VERIO test strip 1 each 2 (two) times daily.     OZEMPIC, 0.25 OR 0.5 MG/DOSE, 2 MG/1.5ML SOPN Inject into the skin.     pregabalin (LYRICA) 25 MG capsule Take 50 mg by mouth 3 (three) times daily.     rosuvastatin (CRESTOR) 5 MG tablet Take 5 mg by mouth daily.     Semaglutide (RYBELSUS) 7 MG TABS Take 7 mg by mouth daily.     traMADol (ULTRAM) 50 MG tablet Take 1 tablet (50 mg total) by mouth every 6 (six) hours as needed. (Patient not taking: Reported on 04/28/2023) 20 tablet 0   No current facility-administered medications on file prior to visit.    There are no Patient Instructions on file for this visit. No follow-ups on file.   Georgiana Spinner, NP

## 2023-07-21 ENCOUNTER — Telehealth (INDEPENDENT_AMBULATORY_CARE_PROVIDER_SITE_OTHER): Payer: Self-pay

## 2023-07-21 NOTE — Telephone Encounter (Signed)
 Patient left a message stating that the left leg is swollen, tender, and has a small pin hole. Patient requested to be seen either Tuesday or Wednesday.Patient will be scheduled to be seen as requested.

## 2023-07-27 ENCOUNTER — Ambulatory Visit (INDEPENDENT_AMBULATORY_CARE_PROVIDER_SITE_OTHER)

## 2023-07-28 ENCOUNTER — Ambulatory Visit (INDEPENDENT_AMBULATORY_CARE_PROVIDER_SITE_OTHER)

## 2023-07-28 ENCOUNTER — Ambulatory Visit (INDEPENDENT_AMBULATORY_CARE_PROVIDER_SITE_OTHER): Admitting: Vascular Surgery

## 2023-07-28 ENCOUNTER — Encounter (INDEPENDENT_AMBULATORY_CARE_PROVIDER_SITE_OTHER): Payer: Self-pay | Admitting: Vascular Surgery

## 2023-07-28 VITALS — BP 179/73 | HR 69 | Resp 18

## 2023-07-28 DIAGNOSIS — E11622 Type 2 diabetes mellitus with other skin ulcer: Secondary | ICD-10-CM | POA: Diagnosis not present

## 2023-07-28 DIAGNOSIS — I890 Lymphedema, not elsewhere classified: Secondary | ICD-10-CM | POA: Diagnosis not present

## 2023-07-28 DIAGNOSIS — L98499 Non-pressure chronic ulcer of skin of other sites with unspecified severity: Secondary | ICD-10-CM

## 2023-07-28 DIAGNOSIS — I10 Essential (primary) hypertension: Secondary | ICD-10-CM

## 2023-07-29 ENCOUNTER — Encounter (INDEPENDENT_AMBULATORY_CARE_PROVIDER_SITE_OTHER): Payer: Self-pay | Admitting: Vascular Surgery

## 2023-07-29 MED ORDER — DOXYCYCLINE HYCLATE 100 MG PO CAPS: 100.0000 mg | ORAL_CAPSULE | Freq: Two times a day (BID) | ORAL | 0 refills | Status: DC

## 2023-07-29 NOTE — Progress Notes (Signed)
 Subjective:    Patient ID: [name], female    DOB: [birth year], 78 y.o.   MRN: [id number] Chief Complaint  Patient presents with   Follow-up    Left le swelling and pain    The patient returns to the office for followup evaluation regarding leg swelling.  The swelling has persisted and she is not using  the lymph pump regularly which is allowed her lymphedema to get out of control.. The pain associated with swelling has now increased.. There have not been any interval development of a ulcerations or wounds.   The patient denies problems with the pump, noting it is working well and the leggings are in good condition but she is not consistently using both..   Since the previous visit the patient has not been wearing graduated compression stockings and using the lymph pump on a routine basis and  has noted significant worsening in the lymphedema.    Patient stated the lymph pump has been helpful with the treatment of the lymphedema when she uses it..     Review of Systems  Constitutional: Negative.   Skin:  Positive for color change.       Bilateral lower extremity swelling/lymphedema with cellulitic changes.  All other systems reviewed and are negative.      Objective:   Physical Exam Constitutional:      Appearance: Normal appearance. She is obese.  HENT:     Head: Normocephalic.   Eyes:     Pupils: Pupils are equal, round, and reactive to light.    Cardiovascular:     Rate and Rhythm: Normal rate and regular rhythm.     Pulses: Normal pulses.     Heart sounds: Normal heart sounds.  Pulmonary:     Effort: Pulmonary effort is normal.     Breath sounds: Normal breath sounds.  Abdominal:     General: Abdomen is flat.     Palpations: Abdomen is soft.   Musculoskeletal:        General: Tenderness present.     Right lower leg: Edema present.     Left lower leg: Edema present.     Comments: Bilateral lower extremity lymphedema with +3 swelling.   Skin:     General: Skin is warm and dry.     Comments: Cellulitic changes noted to the left lower extremity.   Neurological:     Mental Status: She is alert.    BP (!) 179/73   Pulse 69   Resp 18   Past Medical History:  Diagnosis Date   Anemia    as young child   Arthritis    back, leg and foot   Blood transfusion    1964   Carpal tunnel syndrome    Diabetes mellitus    oral medications   Hypertension     Social History   Socioeconomic History   Marital status: Married    Spouse name: Not on file   Number of children: Not on file   Years of education: Not on file   Highest education level: Not on file  Occupational History   Not on file  Tobacco Use   Smoking status: Never   Smokeless tobacco: Never  Substance and Sexual Activity   Alcohol use: No   Drug use: No   Sexual activity: Not on file  Other Topics Concern   Not on file  Social History Narrative   Not on file   Social Drivers of Health  Financial Resource Strain: Not on file  Food Insecurity: Not on file  Transportation Needs: Not on file  Physical Activity: Not on file  Stress: Not on file  Social Connections: Not on file  Intimate Partner Violence: Not on file    Past Surgical History:  Procedure Laterality Date   ABDOMINAL HYSTERECTOMY     ANTERIOR CERVICAL DECOMP/DISCECTOMY FUSION  04/27/2011   Procedure: ANTERIOR CERVICAL DECOMPRESSION/DISCECTOMY FUSION 2 LEVELS;  Surgeon: Ferris Hua, MD;  Location: [facility];  Service: Neurosurgery;  Laterality: N/A;  Anterior Cervical Decompression/ Discectomy Fusion Cervical Three-Four, Cervical Four-Five   APPENDECTOMY     KNEE ARTHROPLASTY     right   PERIPHERAL VASCULAR CATHETERIZATION Left 10/29/2014   Procedure: Lower Extremity Angiography;  Surgeon: Celso College, MD;  Location: [facility];  Service: Cardiovascular;  Laterality: Left;   PERIPHERAL VASCULAR CATHETERIZATION Left 10/29/2014   Procedure: Lower Extremity Intervention;  Surgeon:  Celso College, MD;  Location: [facility];  Service: Cardiovascular;  Laterality: Left;   PERIPHERAL VASCULAR CATHETERIZATION Left 03/11/2015   Procedure: Lower Extremity Angiography;  Surgeon: Celso College, MD;  Location: [facility];  Service: Cardiovascular;  Laterality: Left;   PERIPHERAL VASCULAR CATHETERIZATION Left 03/11/2015   Procedure: Lower Extremity Intervention;  Surgeon: Celso College, MD;  Location: [facility];  Service: Cardiovascular;  Laterality: Left;    Family History  Problem Relation Age of Onset   Cancer Mother    Hypertension Mother    Diabetes Paternal Grandmother     Allergies  Allergen Reactions   Penicillins Shortness Of Breath and Swelling   Cheese Itching   Codeine Other (See Comments)    Ringing in ear TOLERATES Tramadol  Other reaction(s): Other (See Comments) RINGING IN THE EARS Ringing in ear TOLERATES Tramadol    Strawberry (Diagnostic) Hives   Strawberry Extract Hives       Latest Ref Rng & Units 06/09/2016    6:32 PM 08/10/2013    1:15 PM 04/22/2011    1:37 PM  CBC  WBC 3.6 - 11.0 K/uL 2.3  4.0  8.2   Hemoglobin 12.0 - 16.0 g/dL 16.1  09.6  04.5   Hematocrit 35.0 - 47.0 % 35.9  36.1  37.0   Platelets 150 - 440 K/uL 151  196  228       CMP     Component Value Date/Time   NA 139 06/09/2016 1832   NA 139 08/10/2013 1315   K 3.4 (L) 06/09/2016 1832   K 3.7 08/10/2013 1315   CL 100 (L) 06/09/2016 1832   CL 105 08/10/2013 1315   CO2 29 06/09/2016 1832   CO2 28 08/10/2013 1315   GLUCOSE 295 (H) 06/09/2016 1832   GLUCOSE 310 (H) 08/10/2013 1315   BUN 27 (H) 06/09/2016 1832   BUN 15 08/10/2013 1315   CREATININE 1.65 (H) 06/09/2016 1832   CREATININE 1.15 08/10/2013 1315   CALCIUM 8.3 (L) 06/09/2016 1832   CALCIUM 8.8 08/10/2013 1315   PROT 6.8 06/09/2016 1832   ALBUMIN 3.4 (L) 06/09/2016 1832   AST 41 06/09/2016 1832   ALT 28 06/09/2016 1832   ALKPHOS 73 06/09/2016 1832   BILITOT 0.4 06/09/2016 1832   GFRNONAA  30 (L) 06/09/2016 1832   GFRNONAA 49 (L) 08/10/2013 1315     No results found.     Assessment & Plan:   1. Lymphedema (Primary) Recommend:   No surgery or intervention at this  point in time.     I have reviewed my discussion with the patient regarding lymphedema and why it  causes symptoms.  Patient will continue wearing graduated compression on a daily basis. The patient should put the compression on first thing in the morning and removing them in the evening. The patient should not sleep in the compression.    In addition, behavioral modification throughout the day will be continued.  This will include frequent elevation (such as in a recliner), use of over the counter pain medications as needed and exercise such as walking.   The systemic causes for chronic edema such as liver, kidney and cardiac etiologies does not appear to have significant changed over the past year.     The patient will start aggressive use of the  lymph pump.  This will continue to improve the edema control and prevent sequela such as ulcers and infections.    The patient will be placed into Unna boots as a wrap for compression of her lymphedema.  She will do this for 4 weeks and then follow-up.  Patient was also placed on doxycycline 100 mg twice daily for 10 days for early cellulitic changes to left lower extremity.  2. Essential hypertension, benign Continue antihypertensive medications as already ordered, these medications have been reviewed and there are no changes at this time.   3. Type 2 diabetes mellitus with other skin ulcer, unspecified whether long term insulin  use (HCC) Continue hypoglycemic medications as already ordered, these medications have been reviewed and there are no changes at this time.   Hgb A1C to be monitored as already arranged by primary service   Current Outpatient Medications on File Prior to Visit  Medication Sig Dispense Refill   ascorbic acid (VITAMIN C) 1000 MG tablet  Take 1,000 mg by mouth daily.     aspirin  EC 81 MG tablet Take 81 mg by mouth daily.     celecoxib  (CELEBREX ) 200 MG capsule Take 200 mg by mouth daily.     ciprofloxacin  (CIPRO ) 250 MG tablet Take 3 tablets (750 mg total) by mouth 2 (two) times daily. 60 tablet 0   clopidogrel  (PLAVIX ) 75 MG tablet Take 1 tablet (75 mg total) by mouth daily. 30 tablet 11   cyanocobalamin 1000 MCG tablet Take by mouth.     Docusate Calcium (STOOL SOFTENER PO) Take by mouth in the morning and at bedtime.     empagliflozin (JARDIANCE) 25 MG TABS tablet Take 25 mg by mouth daily.     furosemide (LASIX) 20 MG tablet Take 20 mg by mouth daily.     gabapentin (NEURONTIN) 100 MG capsule Take 100 mg by mouth 2 (two) times daily.     glipiZIDE  (GLUCOTROL  XL) 5 MG 24 hr tablet Take 5 mg by mouth 2 (two) times daily.     liraglutide (VICTOZA) 18 MG/3ML SOPN Inject into the skin.     lisinopril  (ZESTRIL ) 20 MG tablet Take 20 mg by mouth daily.     lisinopril -hydrochlorothiazide  (PRINZIDE ,ZESTORETIC ) 20-25 MG per tablet Take 1 tablet by mouth daily.     metFORMIN  (GLUCOPHAGE ) 1000 MG tablet Take 1,000 mg by mouth 2 (two) times daily with a meal.     methocarbamol  (ROBAXIN ) 500 MG tablet Take 1 tablet (500 mg total) by mouth 4 (four) times daily. 20 tablet 0   metoprolol  succinate (TOPROL -XL) 25 MG 24 hr tablet Take 1 tablet by mouth daily.     mupirocin  ointment (BACTROBAN ) 2 % Place 1 application  on wound daily 22 g 0   ONETOUCH VERIO test strip 1 each 2 (two) times daily.     OZEMPIC, 0.25 OR 0.5 MG/DOSE, 2 MG/1.5ML SOPN Inject into the skin.     pregabalin (LYRICA) 25 MG capsule Take 50 mg by mouth 3 (three) times daily.     rosuvastatin (CRESTOR) 5 MG tablet Take 5 mg by mouth daily.     Semaglutide (RYBELSUS) 7 MG TABS Take 7 mg by mouth daily.     traMADol  (ULTRAM ) 50 MG tablet Take 1 tablet (50 mg total) by mouth every 6 (six) hours as needed. (Patient not taking: Reported on 04/28/2023) 20 tablet 0   No current  facility-administered medications on file prior to visit.    There are no Patient Instructions on file for this visit. No follow-ups on file.   Annamaria Barrette, NP

## 2023-08-04 ENCOUNTER — Ambulatory Visit (INDEPENDENT_AMBULATORY_CARE_PROVIDER_SITE_OTHER)

## 2023-08-04 ENCOUNTER — Encounter (INDEPENDENT_AMBULATORY_CARE_PROVIDER_SITE_OTHER): Payer: Self-pay

## 2023-08-04 DIAGNOSIS — L97211 Non-pressure chronic ulcer of right calf limited to breakdown of skin: Secondary | ICD-10-CM

## 2023-08-04 MED ORDER — CIPROFLOXACIN HCL 250 MG PO TABS: 750.0000 mg | ORAL_TABLET | Freq: Two times a day (BID) | ORAL | 0 refills | Status: DC

## 2023-08-04 NOTE — Progress Notes (Signed)
 History of Present Illness  There is no documented history at this time  Assessments & Plan   1. Lower limb ulcer, calf, right, limited to breakdown of skin (HCC)      Additional instructions  Subjective:  Patient presents with venous ulcer of the Bilateral lower extremity.    Procedure:  3 layer unna wrap was placed Bilateral lower extremity.   Plan:   Follow up in one week.

## 2023-08-11 ENCOUNTER — Ambulatory Visit (INDEPENDENT_AMBULATORY_CARE_PROVIDER_SITE_OTHER): Admitting: Nurse Practitioner

## 2023-08-11 ENCOUNTER — Encounter (INDEPENDENT_AMBULATORY_CARE_PROVIDER_SITE_OTHER): Payer: Self-pay

## 2023-08-11 VITALS — BP 115/67 | HR 73 | Resp 16

## 2023-08-11 DIAGNOSIS — L97211 Non-pressure chronic ulcer of right calf limited to breakdown of skin: Secondary | ICD-10-CM | POA: Diagnosis not present

## 2023-08-11 NOTE — Progress Notes (Signed)
 History of Present Illness  There is no documented history at this time  Assessments & Plan   There are no diagnoses linked to this encounter.    Additional instructions  Subjective:  Patient presents with venous ulcer of the Bilateral lower extremity.    Procedure:  3 layer unna wrap was placed Bilateral lower extremity.   Plan:   Follow up in one week.

## 2023-08-18 ENCOUNTER — Ambulatory Visit (INDEPENDENT_AMBULATORY_CARE_PROVIDER_SITE_OTHER): Admitting: Nurse Practitioner

## 2023-08-18 ENCOUNTER — Encounter (INDEPENDENT_AMBULATORY_CARE_PROVIDER_SITE_OTHER): Payer: Self-pay | Admitting: Nurse Practitioner

## 2023-08-18 VITALS — BP 130/50 | HR 84 | Resp 16

## 2023-08-18 DIAGNOSIS — L97211 Non-pressure chronic ulcer of right calf limited to breakdown of skin: Secondary | ICD-10-CM | POA: Diagnosis not present

## 2023-08-18 NOTE — Progress Notes (Signed)
 History of Present Illness  There is no documented history at this time  Assessments & Plan   There are no diagnoses linked to this encounter.    Additional instructions  Subjective:  Patient presents with venous ulcer of the Bilateral lower extremity.    Procedure:  3 layer unna wrap was placed Bilateral lower extremity.   Plan:   Follow up in one week.

## 2023-08-25 ENCOUNTER — Encounter (INDEPENDENT_AMBULATORY_CARE_PROVIDER_SITE_OTHER): Payer: Self-pay | Admitting: Nurse Practitioner

## 2023-08-25 ENCOUNTER — Ambulatory Visit (INDEPENDENT_AMBULATORY_CARE_PROVIDER_SITE_OTHER): Admitting: Nurse Practitioner

## 2023-08-25 VITALS — BP 148/71 | HR 67

## 2023-08-25 DIAGNOSIS — I890 Lymphedema, not elsewhere classified: Secondary | ICD-10-CM | POA: Diagnosis not present

## 2023-08-25 DIAGNOSIS — L97211 Non-pressure chronic ulcer of right calf limited to breakdown of skin: Secondary | ICD-10-CM | POA: Diagnosis not present

## 2023-08-25 DIAGNOSIS — I10 Essential (primary) hypertension: Secondary | ICD-10-CM | POA: Diagnosis not present

## 2023-08-25 NOTE — Progress Notes (Signed)
 Subjective:    Patient ID: [name] Ill, female    DOB: 1945-11-12, 78 y.o.   MRN: [id number] Chief Complaint  Patient presents with   fu x4 unna    The patient returns today following several weeks of Unna boots after developing some small blisters on her left lower extremity.  Today the wounds have essentially healed with just a small area of scarring.  She denies any additional open wounds or ulcerations.  She denies any significant discomfort.  She has been tolerating the Unna boots well.  Unfortunately with her Unna boot she has not been able to utilize her lymphedema pump but she has been elevating her lower extremities.  Overall the lower extremities look improved today.    Review of Systems  Cardiovascular:  Positive for leg swelling.  Musculoskeletal:  Positive for arthralgias.  Skin:  Negative for wound.  All other systems reviewed and are negative.      Objective:   Physical Exam Vitals reviewed.  Cardiovascular:     Rate and Rhythm: Normal rate.  Pulmonary:     Effort: Pulmonary effort is normal.  Musculoskeletal:     Right lower leg: 1+ Edema present.     Left lower leg: 1+ Edema present.  Skin:    General: Skin is warm and dry.  Neurological:     Mental Status: She is alert and oriented to person, place, and time.  Psychiatric:        Mood and Affect: Mood normal.        Behavior: Behavior normal.        Thought Content: Thought content normal.        Judgment: Judgment normal.     BP (!) 148/71   Pulse 67   Past Medical History:  Diagnosis Date   Anemia    as young child   Arthritis    back, leg and foot   Blood transfusion    1964   Carpal tunnel syndrome    Diabetes mellitus    oral medications   Hypertension     Social History   Socioeconomic History   Marital status: Married    Spouse name: Not on file   Number of children: Not on file   Years of education: Not on file   Highest education level: Not on file  Occupational History    Not on file  Tobacco Use   Smoking status: Never   Smokeless tobacco: Never  Substance and Sexual Activity   Alcohol use: No   Drug use: No   Sexual activity: Not on file  Other Topics Concern   Not on file  Social History Narrative   Not on file   Social Drivers of Health   Financial Resource Strain: Not on file  Food Insecurity: Not on file  Transportation Needs: Not on file  Physical Activity: Not on file  Stress: Not on file  Social Connections: Not on file  Intimate Partner Violence: Not on file    Past Surgical History:  Procedure Laterality Date   ABDOMINAL HYSTERECTOMY     ANTERIOR CERVICAL DECOMP/DISCECTOMY FUSION  04/27/2011   Procedure: ANTERIOR CERVICAL DECOMPRESSION/DISCECTOMY FUSION 2 LEVELS;  Surgeon: Arley SHAUNNA Helling, MD;  Location: [facility];  Service: Neurosurgery;  Laterality: N/A;  Anterior Cervical Decompression/ Discectomy Fusion Cervical Three-Four, Cervical Four-Five   APPENDECTOMY     KNEE ARTHROPLASTY     right   PERIPHERAL VASCULAR CATHETERIZATION Left 10/29/2014   Procedure: Lower Extremity Angiography;  Surgeon: Selinda GORMAN Gu, MD;  Location: [facility];  Service: Cardiovascular;  Laterality: Left;   PERIPHERAL VASCULAR CATHETERIZATION Left 10/29/2014   Procedure: Lower Extremity Intervention;  Surgeon: Selinda GORMAN Gu, MD;  Location: [facility];  Service: Cardiovascular;  Laterality: Left;   PERIPHERAL VASCULAR CATHETERIZATION Left 03/11/2015   Procedure: Lower Extremity Angiography;  Surgeon: Selinda GORMAN Gu, MD;  Location: [facility];  Service: Cardiovascular;  Laterality: Left;   PERIPHERAL VASCULAR CATHETERIZATION Left 03/11/2015   Procedure: Lower Extremity Intervention;  Surgeon: Selinda GORMAN Gu, MD;  Location: [facility];  Service: Cardiovascular;  Laterality: Left;    Family History  Problem Relation Age of Onset   Cancer Mother    Hypertension Mother    Diabetes Paternal Grandmother     Allergies  Allergen  Reactions   Penicillins Shortness Of Breath and Swelling   Cheese Itching   Codeine Other (See Comments)    Ringing in ear TOLERATES Tramadol  Other reaction(s): Other (See Comments) RINGING IN THE EARS Ringing in ear TOLERATES Tramadol    Doxycycline  Nausea Only   Strawberry (Diagnostic) Hives   Strawberry Extract Hives       Latest Ref Rng & Units 06/09/2016    6:32 PM 08/10/2013    1:15 PM 04/22/2011    1:37 PM  CBC  WBC 3.6 - 11.0 K/uL 2.3  4.0  8.2   Hemoglobin 12.0 - 16.0 g/dL 88.1  88.4  87.5   Hematocrit 35.0 - 47.0 % 35.9  36.1  37.0   Platelets 150 - 440 K/uL 151  196  228       CMP     Component Value Date/Time   NA 139 06/09/2016 1832   NA 139 08/10/2013 1315   K 3.4 (L) 06/09/2016 1832   K 3.7 08/10/2013 1315   CL 100 (L) 06/09/2016 1832   CL 105 08/10/2013 1315   CO2 29 06/09/2016 1832   CO2 28 08/10/2013 1315   GLUCOSE 295 (H) 06/09/2016 1832   GLUCOSE 310 (H) 08/10/2013 1315   BUN 27 (H) 06/09/2016 1832   BUN 15 08/10/2013 1315   CREATININE 1.65 (H) 06/09/2016 1832   CREATININE 1.15 08/10/2013 1315   CALCIUM 8.3 (L) 06/09/2016 1832   CALCIUM 8.8 08/10/2013 1315   PROT 6.8 06/09/2016 1832   ALBUMIN 3.4 (L) 06/09/2016 1832   AST 41 06/09/2016 1832   ALT 28 06/09/2016 1832   ALKPHOS 73 06/09/2016 1832   BILITOT 0.4 06/09/2016 1832   GFRNONAA 30 (L) 06/09/2016 1832   GFRNONAA 49 (L) 08/10/2013 1315     No results found.     Assessment & Plan:   1. Lower limb ulcer, calf, right, limited to breakdown of skin (HCC) (Primary) This is largely healed today.  Based on this we will remove the patient from Unna boots.  2. Lymphedema The patient has been very good about adhering to her conservative therapies including her medical grade compression and use of her lymphedema pump.  She is advised to continue with these activities.  We will plan on having her return in 4 weeks to ensure there has not been any worsening of the previous wound.  3. Essential  hypertension, benign Continue antihypertensive medications as already ordered, these medications have been reviewed and there are no changes at this time.   Current Outpatient Medications on File Prior to Visit  Medication Sig Dispense Refill   ascorbic acid (VITAMIN C) 1000 MG tablet Take  1,000 mg by mouth daily.     aspirin  EC 81 MG tablet Take 81 mg by mouth daily.     celecoxib  (CELEBREX ) 200 MG capsule Take 200 mg by mouth daily.     ciprofloxacin  (CIPRO ) 250 MG tablet Take 3 tablets (750 mg total) by mouth 2 (two) times daily. 60 tablet 0   clopidogrel  (PLAVIX ) 75 MG tablet Take 1 tablet (75 mg total) by mouth daily. 30 tablet 11   cyanocobalamin 1000 MCG tablet Take by mouth.     Docusate Calcium (STOOL SOFTENER PO) Take by mouth in the morning and at bedtime.     doxycycline  (VIBRAMYCIN ) 100 MG capsule Take 1 capsule (100 mg total) by mouth 2 (two) times daily. 28 capsule 0   empagliflozin (JARDIANCE) 25 MG TABS tablet Take 25 mg by mouth daily.     furosemide (LASIX) 20 MG tablet Take 20 mg by mouth daily.     gabapentin (NEURONTIN) 100 MG capsule Take 100 mg by mouth 2 (two) times daily.     glipiZIDE  (GLUCOTROL  XL) 5 MG 24 hr tablet Take 5 mg by mouth 2 (two) times daily.     liraglutide (VICTOZA) 18 MG/3ML SOPN Inject into the skin.     lisinopril  (ZESTRIL ) 20 MG tablet Take 20 mg by mouth daily.     lisinopril -hydrochlorothiazide  (PRINZIDE ,ZESTORETIC ) 20-25 MG per tablet Take 1 tablet by mouth daily.     metFORMIN  (GLUCOPHAGE ) 1000 MG tablet Take 1,000 mg by mouth 2 (two) times daily with a meal.     methocarbamol  (ROBAXIN ) 500 MG tablet Take 1 tablet (500 mg total) by mouth 4 (four) times daily. 20 tablet 0   metoprolol  succinate (TOPROL -XL) 25 MG 24 hr tablet Take 1 tablet by mouth daily.     mupirocin  ointment (BACTROBAN ) 2 % Place 1 application on wound daily 22 g 0   ONETOUCH VERIO test strip 1 each 2 (two) times daily.     OZEMPIC, 0.25 OR 0.5 MG/DOSE, 2 MG/1.5ML SOPN  Inject into the skin.     pregabalin (LYRICA) 25 MG capsule Take 50 mg by mouth 3 (three) times daily.     rosuvastatin (CRESTOR) 5 MG tablet Take 5 mg by mouth daily.     Semaglutide (RYBELSUS) 7 MG TABS Take 7 mg by mouth daily.     traMADol  (ULTRAM ) 50 MG tablet Take 1 tablet (50 mg total) by mouth every 6 (six) hours as needed. 20 tablet 0   No current facility-administered medications on file prior to visit.    There are no Patient Instructions on file for this visit. No follow-ups on file.   Tresea Heine E Sallye Lunz, NP

## 2023-09-21 ENCOUNTER — Ambulatory Visit (INDEPENDENT_AMBULATORY_CARE_PROVIDER_SITE_OTHER): Admitting: Nurse Practitioner

## 2023-09-21 ENCOUNTER — Encounter (INDEPENDENT_AMBULATORY_CARE_PROVIDER_SITE_OTHER): Payer: Self-pay | Admitting: Nurse Practitioner

## 2023-09-21 ENCOUNTER — Other Ambulatory Visit (INDEPENDENT_AMBULATORY_CARE_PROVIDER_SITE_OTHER): Payer: Self-pay | Admitting: Nurse Practitioner

## 2023-09-21 VITALS — BP 141/73 | HR 71

## 2023-09-21 DIAGNOSIS — E11622 Type 2 diabetes mellitus with other skin ulcer: Secondary | ICD-10-CM | POA: Diagnosis not present

## 2023-09-21 DIAGNOSIS — L98499 Non-pressure chronic ulcer of skin of other sites with unspecified severity: Secondary | ICD-10-CM | POA: Diagnosis not present

## 2023-09-21 DIAGNOSIS — L97211 Non-pressure chronic ulcer of right calf limited to breakdown of skin: Secondary | ICD-10-CM

## 2023-09-21 DIAGNOSIS — I10 Essential (primary) hypertension: Secondary | ICD-10-CM | POA: Diagnosis not present

## 2023-09-21 MED ORDER — TRAMADOL HCL 50 MG PO TABS
50.0000 mg | ORAL_TABLET | Freq: Four times a day (QID) | ORAL | 0 refills | Status: DC | PRN
Start: 2023-09-21 — End: 2023-11-10

## 2023-09-21 MED ORDER — CIPROFLOXACIN HCL 250 MG PO TABS: 750.0000 mg | ORAL_TABLET | Freq: Two times a day (BID) | ORAL | 0 refills | Status: DC

## 2023-09-21 NOTE — Progress Notes (Unsigned)
 Subjective:    Patient ID: [name], female    DOB: December 17, 1945, 78 y.o.   MRN: [id number] Chief Complaint  Patient presents with  . Follow-up    HPI  Review of Systems     Objective:   Physical Exam  BP (!) 141/73   Pulse 71   Past Medical History:  Diagnosis Date  . Anemia    as young child  . Arthritis    back, leg and foot  . Blood transfusion    1964  . Carpal tunnel syndrome   . Diabetes mellitus    oral medications  . Hypertension     Social History   Socioeconomic History  . Marital status: Married    Spouse name: Not on file  . Number of children: Not on file  . Years of education: Not on file  . Highest education level: Not on file  Occupational History  . Not on file  Tobacco Use  . Smoking status: Never  . Smokeless tobacco: Never  Substance and Sexual Activity  . Alcohol use: No  . Drug use: No  . Sexual activity: Not on file  Other Topics Concern  . Not on file  Social History Narrative  . Not on file   Social Drivers of Health   Financial Resource Strain: Not on file  Food Insecurity: Not on file  Transportation Needs: Not on file  Physical Activity: Not on file  Stress: Not on file  Social Connections: Not on file  Intimate Partner Violence: Not on file    Past Surgical History:  Procedure Laterality Date  . ABDOMINAL HYSTERECTOMY    . ANTERIOR CERVICAL DECOMP/DISCECTOMY FUSION  04/27/2011   Procedure: ANTERIOR CERVICAL DECOMPRESSION/DISCECTOMY FUSION 2 LEVELS;  Surgeon: Arley SHAUNNA Helling, MD;  Location: [facility];  Service: Neurosurgery;  Laterality: N/A;  Anterior Cervical Decompression/ Discectomy Fusion Cervical Three-Four, Cervical Four-Five  . APPENDECTOMY    . KNEE ARTHROPLASTY     right  . PERIPHERAL VASCULAR CATHETERIZATION Left 10/29/2014   Procedure: Lower Extremity Angiography;  Surgeon: Selinda GORMAN Gu, MD;  Location: [facility];  Service: Cardiovascular;  Laterality: Left;  . PERIPHERAL VASCULAR  CATHETERIZATION Left 10/29/2014   Procedure: Lower Extremity Intervention;  Surgeon: Selinda GORMAN Gu, MD;  Location: [facility];  Service: Cardiovascular;  Laterality: Left;  . PERIPHERAL VASCULAR CATHETERIZATION Left 03/11/2015   Procedure: Lower Extremity Angiography;  Surgeon: Selinda GORMAN Gu, MD;  Location: [facility];  Service: Cardiovascular;  Laterality: Left;  . PERIPHERAL VASCULAR CATHETERIZATION Left 03/11/2015   Procedure: Lower Extremity Intervention;  Surgeon: Selinda GORMAN Gu, MD;  Location: [facility];  Service: Cardiovascular;  Laterality: Left;    Family History  Problem Relation Age of Onset  . Cancer Mother   . Hypertension Mother   . Diabetes Paternal Grandmother     Allergies  Allergen Reactions  . Penicillins Shortness Of Breath and Swelling  . Cheese Itching  . Codeine Other (See Comments)    Ringing in ear TOLERATES Tramadol  Other reaction(s): Other (See Comments) RINGING IN THE EARS Ringing in ear TOLERATES Tramadol   . Doxycycline  Nausea Only  . Strawberry (Diagnostic) Hives  . Strawberry Extract Hives       Latest Ref Rng & Units 06/09/2016    6:32 PM 08/10/2013    1:15 PM 04/22/2011    1:37 PM  CBC  WBC 3.6 - 11.0 K/uL 2.3  4.0  8.2   Hemoglobin  12.0 - 16.0 g/dL 88.1  88.4  87.5   Hematocrit 35.0 - 47.0 % 35.9  36.1  37.0   Platelets 150 - 440 K/uL 151  196  228       CMP     Component Value Date/Time   NA 139 06/09/2016 1832   NA 139 08/10/2013 1315   K 3.4 (L) 06/09/2016 1832   K 3.7 08/10/2013 1315   CL 100 (L) 06/09/2016 1832   CL 105 08/10/2013 1315   CO2 29 06/09/2016 1832   CO2 28 08/10/2013 1315   GLUCOSE 295 (H) 06/09/2016 1832   GLUCOSE 310 (H) 08/10/2013 1315   BUN 27 (H) 06/09/2016 1832   BUN 15 08/10/2013 1315   CREATININE 1.65 (H) 06/09/2016 1832   CREATININE 1.15 08/10/2013 1315   CALCIUM 8.3 (L) 06/09/2016 1832   CALCIUM 8.8 08/10/2013 1315   PROT 6.8 06/09/2016 1832   ALBUMIN 3.4 (L) 06/09/2016 1832    AST 41 06/09/2016 1832   ALT 28 06/09/2016 1832   ALKPHOS 73 06/09/2016 1832   BILITOT 0.4 06/09/2016 1832   GFRNONAA 30 (L) 06/09/2016 1832   GFRNONAA 49 (L) 08/10/2013 1315     No results found.     Assessment & Plan:   1. Lower limb ulcer, calf, right, limited to breakdown of skin (HCC) *** - ciprofloxacin  (CIPRO ) 250 MG tablet; Take 3 tablets (750 mg total) by mouth 2 (two) times daily.  Dispense: 60 tablet; Refill: 0 - traMADol  (ULTRAM ) 50 MG tablet; Take 1 tablet (50 mg total) by mouth every 6 (six) hours as needed.  Dispense: 20 tablet; Refill: 0  2. Type 2 diabetes mellitus with other skin ulcer, unspecified whether long term insulin  use (HCC) (Primary) ***  3. Primary hypertension ***   Current Outpatient Medications on File Prior to Visit  Medication Sig Dispense Refill  . ascorbic acid (VITAMIN C) 1000 MG tablet Take 1,000 mg by mouth daily.    . aspirin  EC 81 MG tablet Take 81 mg by mouth daily.    . celecoxib  (CELEBREX ) 200 MG capsule Take 200 mg by mouth daily.    . clopidogrel  (PLAVIX ) 75 MG tablet Take 1 tablet (75 mg total) by mouth daily. 30 tablet 11  . cyanocobalamin 1000 MCG tablet Take by mouth.    . Docusate Calcium (STOOL SOFTENER PO) Take by mouth in the morning and at bedtime.    . doxycycline  (VIBRAMYCIN ) 100 MG capsule Take 1 capsule (100 mg total) by mouth 2 (two) times daily. 28 capsule 0  . empagliflozin (JARDIANCE) 25 MG TABS tablet Take 25 mg by mouth daily.    . furosemide (LASIX) 20 MG tablet Take 20 mg by mouth daily.    SABRA gabapentin (NEURONTIN) 100 MG capsule Take 100 mg by mouth 2 (two) times daily.    . glipiZIDE  (GLUCOTROL  XL) 5 MG 24 hr tablet Take 5 mg by mouth 2 (two) times daily.    SABRA liraglutide (VICTOZA) 18 MG/3ML SOPN Inject into the skin.    . lisinopril  (ZESTRIL ) 20 MG tablet Take 20 mg by mouth daily.    . lisinopril -hydrochlorothiazide  (PRINZIDE ,ZESTORETIC ) 20-25 MG per tablet Take 1 tablet by mouth daily.    . metFORMIN   (GLUCOPHAGE ) 1000 MG tablet Take 1,000 mg by mouth 2 (two) times daily with a meal.    . methocarbamol  (ROBAXIN ) 500 MG tablet Take 1 tablet (500 mg total) by mouth 4 (four) times daily. 20 tablet 0  . metoprolol  succinate (TOPROL -XL)  25 MG 24 hr tablet Take 1 tablet by mouth daily.    . mupirocin  ointment (BACTROBAN ) 2 % Place 1 application on wound daily 22 g 0  . ONETOUCH VERIO test strip 1 each 2 (two) times daily.    SABRA OZEMPIC, 0.25 OR 0.5 MG/DOSE, 2 MG/1.5ML SOPN Inject into the skin.    . pregabalin (LYRICA) 25 MG capsule Take 50 mg by mouth 3 (three) times daily.    . rosuvastatin (CRESTOR) 5 MG tablet Take 5 mg by mouth daily.    . Semaglutide (RYBELSUS) 7 MG TABS Take 7 mg by mouth daily.     No current facility-administered medications on file prior to visit.    There are no Patient Instructions on file for this visit. No follow-ups on file.   Salome Hautala E Olinda Nola, NP

## 2023-09-25 LAB — AEROBIC CULTURE

## 2023-09-28 ENCOUNTER — Ambulatory Visit (INDEPENDENT_AMBULATORY_CARE_PROVIDER_SITE_OTHER): Admitting: Nurse Practitioner

## 2023-09-29 ENCOUNTER — Ambulatory Visit (INDEPENDENT_AMBULATORY_CARE_PROVIDER_SITE_OTHER): Admitting: Nurse Practitioner

## 2023-09-29 ENCOUNTER — Encounter (INDEPENDENT_AMBULATORY_CARE_PROVIDER_SITE_OTHER): Payer: Self-pay

## 2023-09-29 VITALS — BP 123/74 | HR 60 | Resp 18

## 2023-09-29 DIAGNOSIS — L97211 Non-pressure chronic ulcer of right calf limited to breakdown of skin: Secondary | ICD-10-CM

## 2023-09-29 NOTE — Progress Notes (Signed)
 History of Present Illness  There is no documented history at this time  Assessments & Plan   There are no diagnoses linked to this encounter.    Additional instructions  Subjective:  Patient presents with venous ulcer of the Bilateral lower extremity.    Procedure:  3 layer unna wrap was placed Bilateral lower extremity.   Plan:   Follow up in one week.

## 2023-10-06 ENCOUNTER — Encounter (INDEPENDENT_AMBULATORY_CARE_PROVIDER_SITE_OTHER): Payer: Self-pay

## 2023-10-06 ENCOUNTER — Ambulatory Visit (INDEPENDENT_AMBULATORY_CARE_PROVIDER_SITE_OTHER): Admitting: Nurse Practitioner

## 2023-10-06 VITALS — BP 137/71 | HR 73 | Resp 18

## 2023-10-06 DIAGNOSIS — L97211 Non-pressure chronic ulcer of right calf limited to breakdown of skin: Secondary | ICD-10-CM | POA: Diagnosis not present

## 2023-10-06 NOTE — Progress Notes (Signed)
 History of Present Illness  There is no documented history at this time  Assessments & Plan   There are no diagnoses linked to this encounter.    Additional instructions  Subjective:  Patient presents with venous ulcer of the Bilateral lower extremity.    Procedure:  3 layer unna wrap was placed Bilateral lower extremity.   Plan:   Follow up in one week.

## 2023-10-13 ENCOUNTER — Ambulatory Visit (INDEPENDENT_AMBULATORY_CARE_PROVIDER_SITE_OTHER): Admitting: Nurse Practitioner

## 2023-10-13 ENCOUNTER — Encounter (INDEPENDENT_AMBULATORY_CARE_PROVIDER_SITE_OTHER): Payer: Self-pay | Admitting: Nurse Practitioner

## 2023-10-13 VITALS — BP 123/70 | HR 62 | Resp 18

## 2023-10-13 DIAGNOSIS — L97211 Non-pressure chronic ulcer of right calf limited to breakdown of skin: Secondary | ICD-10-CM

## 2023-10-13 NOTE — Progress Notes (Signed)
 History of Present Illness  There is no documented history at this time  Assessments & Plan   There are no diagnoses linked to this encounter.    Additional instructions  Subjective:  Patient presents with venous ulcer of the Bilateral lower extremity.    Procedure:  3 layer unna wrap was placed Bilateral lower extremity.   Plan:   Follow up in one week.

## 2023-10-18 ENCOUNTER — Encounter (INDEPENDENT_AMBULATORY_CARE_PROVIDER_SITE_OTHER): Payer: Self-pay | Admitting: Nurse Practitioner

## 2023-10-20 ENCOUNTER — Ambulatory Visit (INDEPENDENT_AMBULATORY_CARE_PROVIDER_SITE_OTHER): Admitting: Nurse Practitioner

## 2023-10-20 ENCOUNTER — Encounter (INDEPENDENT_AMBULATORY_CARE_PROVIDER_SITE_OTHER): Payer: Self-pay

## 2023-10-20 VITALS — BP 105/65 | HR 69 | Resp 18

## 2023-10-20 DIAGNOSIS — L97211 Non-pressure chronic ulcer of right calf limited to breakdown of skin: Secondary | ICD-10-CM | POA: Diagnosis not present

## 2023-10-20 NOTE — Progress Notes (Signed)
 History of Present Illness  There is no documented history at this time  Assessments & Plan   There are no diagnoses linked to this encounter.    Additional instructions  Subjective:  Patient presents with venous ulcer of the Bilateral lower extremity.    Procedure:  3 layer unna wrap was placed Bilateral lower extremity.   Plan:   Follow up in one week.

## 2023-10-24 ENCOUNTER — Encounter (INDEPENDENT_AMBULATORY_CARE_PROVIDER_SITE_OTHER): Payer: Self-pay | Admitting: Nurse Practitioner

## 2023-10-27 ENCOUNTER — Ambulatory Visit (INDEPENDENT_AMBULATORY_CARE_PROVIDER_SITE_OTHER): Admitting: Nurse Practitioner

## 2023-10-27 ENCOUNTER — Encounter (INDEPENDENT_AMBULATORY_CARE_PROVIDER_SITE_OTHER): Payer: Self-pay | Admitting: Nurse Practitioner

## 2023-10-27 VITALS — BP 150/70 | HR 70 | Resp 18 | Ht 68.0 in

## 2023-10-27 DIAGNOSIS — L98499 Non-pressure chronic ulcer of skin of other sites with unspecified severity: Secondary | ICD-10-CM

## 2023-10-27 DIAGNOSIS — L97211 Non-pressure chronic ulcer of right calf limited to breakdown of skin: Secondary | ICD-10-CM

## 2023-10-27 DIAGNOSIS — I10 Essential (primary) hypertension: Secondary | ICD-10-CM

## 2023-10-27 DIAGNOSIS — E11622 Type 2 diabetes mellitus with other skin ulcer: Secondary | ICD-10-CM | POA: Diagnosis not present

## 2023-10-31 ENCOUNTER — Encounter (INDEPENDENT_AMBULATORY_CARE_PROVIDER_SITE_OTHER): Payer: Self-pay | Admitting: Nurse Practitioner

## 2023-10-31 NOTE — Progress Notes (Signed)
 Subjective:    Patient ID: [name] Ill, female    DOB: 15-Apr-1945, 78 y.o.   MRN: [id number] Chief Complaint  Patient presents with   Follow-up    unna 4x see fb    The patient returns today for evaluation after having a blister on her left lower extremity.  Today after several weeks after weeks of Unna boots the patient's swelling is much improved.  There have not been any development of any additional open wounds or ulcerations.      Review of Systems  Cardiovascular:  Positive for leg swelling.  Neurological:  Positive for weakness.  All other systems reviewed and are negative.      Objective:   Physical Exam Vitals reviewed.  HENT:     Head: Normocephalic.  Cardiovascular:     Rate and Rhythm: Normal rate.  Pulmonary:     Effort: Pulmonary effort is normal.  Musculoskeletal:     Right lower leg: Edema present.     Left lower leg: Edema present.  Skin:    General: Skin is warm and dry.  Neurological:     Mental Status: She is alert.  Psychiatric:        Mood and Affect: Mood normal.        Behavior: Behavior normal.        Thought Content: Thought content normal.        Judgment: Judgment normal.     BP (!) 150/70 (BP Location: Left Arm, Cuff Size: Large)   Pulse 70   Resp 18   Ht 5' 8 (1.727 m)   BMI 41.97 kg/m   Past Medical History:  Diagnosis Date   Anemia    as young child   Arthritis    back, leg and foot   Blood transfusion    1964   Carpal tunnel syndrome    Diabetes mellitus    oral medications   Hypertension     Social History   Socioeconomic History   Marital status: Married    Spouse name: Not on file   Number of children: Not on file   Years of education: Not on file   Highest education level: Not on file  Occupational History   Not on file  Tobacco Use   Smoking status: Never   Smokeless tobacco: Never  Substance and Sexual Activity   Alcohol use: No   Drug use: No   Sexual activity: Not on file  Other Topics  Concern   Not on file  Social History Narrative   Not on file   Social Drivers of Health   Financial Resource Strain: Low Risk  (10/11/2023)   Received from [facility]   Overall Financial Resource Strain (CARDIA)    Difficulty of Paying Living Expenses: Not very hard  Food Insecurity: No Food Insecurity (10/11/2023)   Received from [facility]   Hunger Vital Sign    Within the past 12 months, you worried that your food would run out before you got the money to buy more.: Never true    Within the past 12 months, the food you bought just didn't last and you didn't have money to get more.: Never true  Transportation Needs: No Transportation Needs (10/11/2023)   Received from [facility]    In the past 12 months, has lack of transportation kept you from medical appointments or from getting medications?: No    Lack of  Transportation (Non-Medical): No  Physical Activity: Not on file  Stress: Not on file  Social Connections: Not on file  Intimate Partner Violence: Not on file    Past Surgical History:  Procedure Laterality Date   ABDOMINAL HYSTERECTOMY     ANTERIOR CERVICAL DECOMP/DISCECTOMY FUSION  04/27/2011   Procedure: ANTERIOR CERVICAL DECOMPRESSION/DISCECTOMY FUSION 2 LEVELS;  Surgeon: Arley SHAUNNA Helling, MD;  Location: [facility];  Service: Neurosurgery;  Laterality: N/A;  Anterior Cervical Decompression/ Discectomy Fusion Cervical Three-Four, Cervical Four-Five   APPENDECTOMY     KNEE ARTHROPLASTY     right   PERIPHERAL VASCULAR CATHETERIZATION Left 10/29/2014   Procedure: Lower Extremity Angiography;  Surgeon: Selinda GORMAN Gu, MD;  Location: [facility];  Service: Cardiovascular;  Laterality: Left;   PERIPHERAL VASCULAR CATHETERIZATION Left 10/29/2014   Procedure: Lower Extremity Intervention;  Surgeon: Selinda GORMAN Gu, MD;  Location: [facility];  Service: Cardiovascular;  Laterality: Left;    PERIPHERAL VASCULAR CATHETERIZATION Left 03/11/2015   Procedure: Lower Extremity Angiography;  Surgeon: Selinda GORMAN Gu, MD;  Location: [facility];  Service: Cardiovascular;  Laterality: Left;   PERIPHERAL VASCULAR CATHETERIZATION Left 03/11/2015   Procedure: Lower Extremity Intervention;  Surgeon: Selinda GORMAN Gu, MD;  Location: [facility];  Service: Cardiovascular;  Laterality: Left;    Family History  Problem Relation Age of Onset   Cancer Mother    Hypertension Mother    Diabetes Paternal Grandmother     Allergies  Allergen Reactions   Penicillins Shortness Of Breath and Swelling   Cheese Itching   Codeine Other (See Comments)    Ringing in ear TOLERATES Tramadol  Other reaction(s): Other (See Comments) RINGING IN THE EARS Ringing in ear TOLERATES Tramadol    Doxycycline  Nausea Only   Strawberry (Diagnostic) Hives   Strawberry Extract Hives       Latest Ref Rng & Units 06/09/2016    6:32 PM 08/10/2013    1:15 PM 04/22/2011    1:37 PM  CBC  WBC 3.6 - 11.0 K/uL 2.3  4.0  8.2   Hemoglobin 12.0 - 16.0 g/dL 88.1  88.4  87.5   Hematocrit 35.0 - 47.0 % 35.9  36.1  37.0   Platelets 150 - 440 K/uL 151  196  228       CMP     Component Value Date/Time   NA 139 06/09/2016 1832   NA 139 08/10/2013 1315   K 3.4 (L) 06/09/2016 1832   K 3.7 08/10/2013 1315   CL 100 (L) 06/09/2016 1832   CL 105 08/10/2013 1315   CO2 29 06/09/2016 1832   CO2 28 08/10/2013 1315   GLUCOSE 295 (H) 06/09/2016 1832   GLUCOSE 310 (H) 08/10/2013 1315   BUN 27 (H) 06/09/2016 1832   BUN 15 08/10/2013 1315   CREATININE 1.65 (H) 06/09/2016 1832   CREATININE 1.15 08/10/2013 1315   CALCIUM 8.3 (L) 06/09/2016 1832   CALCIUM 8.8 08/10/2013 1315   PROT 6.8 06/09/2016 1832   ALBUMIN 3.4 (L) 06/09/2016 1832   AST 41 06/09/2016 1832   ALT 28 06/09/2016 1832   ALKPHOS 73 06/09/2016 1832   BILITOT 0.4 06/09/2016 1832   GFRNONAA 30 (L) 06/09/2016 1832   GFRNONAA 49 (L) 08/10/2013 1315     No  results found.     Assessment & Plan:   1. Lower limb ulcer, calf, right, limited to breakdown of skin (HCC) Note the wound is largely healed at this point however  there is still some superficial area that remains.  In order to ensure that she does not have recurrence of her wound will continue to wrap her for several more weeks at which point in time I do believe she will likely be able to return back to her conservative measures.  2. Type 2 diabetes mellitus with other skin ulcer, unspecified whether long term insulin  use (HCC) (Primary) Continue hypoglycemic medications as already ordered, these medications have been reviewed and there are no changes at this time.  Hgb A1C to be monitored as already arranged by primary service  3. Primary hypertension Continue antihypertensive medications as already ordered, these medications have been reviewed and there are no changes at this time.   Current Outpatient Medications on File Prior to Visit  Medication Sig Dispense Refill   ascorbic acid (VITAMIN C) 1000 MG tablet Take 1,000 mg by mouth daily.     aspirin  EC 81 MG tablet Take 81 mg by mouth daily.     celecoxib  (CELEBREX ) 200 MG capsule Take 200 mg by mouth daily.     clopidogrel  (PLAVIX ) 75 MG tablet Take 1 tablet (75 mg total) by mouth daily. 30 tablet 11   Docusate Calcium (STOOL SOFTENER PO) Take by mouth in the morning and at bedtime.     doxycycline  (VIBRAMYCIN ) 100 MG capsule Take 1 capsule (100 mg total) by mouth 2 (two) times daily. 28 capsule 0   empagliflozin (JARDIANCE) 25 MG TABS tablet Take 25 mg by mouth daily.     furosemide (LASIX) 20 MG tablet Take 20 mg by mouth daily.     gabapentin (NEURONTIN) 100 MG capsule Take 100 mg by mouth 2 (two) times daily.     glipiZIDE  (GLUCOTROL  XL) 5 MG 24 hr tablet Take 5 mg by mouth 2 (two) times daily.     lisinopril  (ZESTRIL ) 20 MG tablet Take 20 mg by mouth daily.     lisinopril -hydrochlorothiazide  (PRINZIDE ,ZESTORETIC ) 20-25 MG  per tablet Take 1 tablet by mouth daily.     metFORMIN  (GLUCOPHAGE ) 1000 MG tablet Take 1,000 mg by mouth 2 (two) times daily with a meal.     methocarbamol  (ROBAXIN ) 500 MG tablet Take 1 tablet (500 mg total) by mouth 4 (four) times daily. 20 tablet 0   ONETOUCH VERIO test strip 1 each 2 (two) times daily.     pregabalin (LYRICA) 25 MG capsule Take 50 mg by mouth 3 (three) times daily.     rosuvastatin (CRESTOR) 5 MG tablet Take 5 mg by mouth daily.     Semaglutide (RYBELSUS) 7 MG TABS Take 7 mg by mouth daily.     traMADol  (ULTRAM ) 50 MG tablet Take 1 tablet (50 mg total) by mouth every 6 (six) hours as needed. 20 tablet 0   ciprofloxacin  (CIPRO ) 250 MG tablet Take 3 tablets (750 mg total) by mouth 2 (two) times daily. (Patient not taking: Reported on 10/27/2023) 60 tablet 0   cyanocobalamin 1000 MCG tablet Take by mouth. (Patient not taking: Reported on 10/27/2023)     liraglutide (VICTOZA) 18 MG/3ML SOPN Inject into the skin. (Patient not taking: Reported on 10/27/2023)     metoprolol  succinate (TOPROL -XL) 25 MG 24 hr tablet Take 1 tablet by mouth daily. (Patient not taking: Reported on 10/27/2023)     mupirocin  ointment (BACTROBAN ) 2 % Place 1 application on wound daily (Patient not taking: Reported on 10/27/2023) 22 g 0   OZEMPIC, 0.25 OR 0.5 MG/DOSE, 2 MG/1.5ML SOPN Inject into the skin. (Patient  not taking: Reported on 10/27/2023)     No current facility-administered medications on file prior to visit.    There are no Patient Instructions on file for this visit. No follow-ups on file.   Ivery Nanney E Kaydence Baba, NP

## 2023-11-01 ENCOUNTER — Telehealth (INDEPENDENT_AMBULATORY_CARE_PROVIDER_SITE_OTHER): Payer: Self-pay | Admitting: *Deleted

## 2023-11-01 NOTE — Telephone Encounter (Signed)
 Patient left a message wanting to speak to April regarding her appointment on 11/03/2023.

## 2023-11-02 NOTE — Telephone Encounter (Signed)
 I have spoke with the patient and she will come in earlier for her appt

## 2023-11-03 ENCOUNTER — Encounter (INDEPENDENT_AMBULATORY_CARE_PROVIDER_SITE_OTHER): Payer: Self-pay

## 2023-11-03 ENCOUNTER — Ambulatory Visit (INDEPENDENT_AMBULATORY_CARE_PROVIDER_SITE_OTHER): Admitting: Nurse Practitioner

## 2023-11-03 VITALS — BP 124/76 | HR 89 | Resp 18

## 2023-11-03 DIAGNOSIS — L97221 Non-pressure chronic ulcer of left calf limited to breakdown of skin: Secondary | ICD-10-CM

## 2023-11-03 DIAGNOSIS — L97211 Non-pressure chronic ulcer of right calf limited to breakdown of skin: Secondary | ICD-10-CM

## 2023-11-03 NOTE — Progress Notes (Signed)
 History of Present Illness  There is no documented history at this time  Assessments & Plan   There are no diagnoses linked to this encounter.    Additional instructions  Subjective:  Patient presents with venous ulcer of the Left lower extremity.    Procedure:  3 layer unna wrap was placed Left lower extremity.   Plan:   Follow up in one week.

## 2023-11-10 ENCOUNTER — Encounter (INDEPENDENT_AMBULATORY_CARE_PROVIDER_SITE_OTHER): Payer: Self-pay

## 2023-11-10 ENCOUNTER — Encounter (INDEPENDENT_AMBULATORY_CARE_PROVIDER_SITE_OTHER)

## 2023-11-10 ENCOUNTER — Ambulatory Visit (INDEPENDENT_AMBULATORY_CARE_PROVIDER_SITE_OTHER): Admitting: Nurse Practitioner

## 2023-11-10 DIAGNOSIS — L97211 Non-pressure chronic ulcer of right calf limited to breakdown of skin: Secondary | ICD-10-CM

## 2023-11-10 NOTE — Progress Notes (Signed)
 History of Present Illness  There is no documented history at this time  Assessments & Plan   1. Lower limb ulcer, calf, right, limited to breakdown of skin (HCC)      Additional instructions  Subjective:  Patient presents with venous ulcer of the Bilateral lower extremity.    Procedure:  3 layer unna wrap was placed Bilateral lower extremity.   Plan:   Follow up in one week.

## 2023-11-14 ENCOUNTER — Encounter (INDEPENDENT_AMBULATORY_CARE_PROVIDER_SITE_OTHER): Payer: Self-pay | Admitting: Nurse Practitioner

## 2023-11-14 MED ORDER — TRAMADOL HCL 50 MG PO TABS
50.0000 mg | ORAL_TABLET | Freq: Four times a day (QID) | ORAL | 1 refills | Status: AC | PRN
Start: 2023-11-14 — End: ?

## 2023-11-15 ENCOUNTER — Other Ambulatory Visit (INDEPENDENT_AMBULATORY_CARE_PROVIDER_SITE_OTHER): Payer: Self-pay | Admitting: Nurse Practitioner

## 2023-11-15 DIAGNOSIS — L97211 Non-pressure chronic ulcer of right calf limited to breakdown of skin: Secondary | ICD-10-CM

## 2023-11-17 ENCOUNTER — Encounter (INDEPENDENT_AMBULATORY_CARE_PROVIDER_SITE_OTHER): Payer: Self-pay | Admitting: Nurse Practitioner

## 2023-11-17 ENCOUNTER — Ambulatory Visit (INDEPENDENT_AMBULATORY_CARE_PROVIDER_SITE_OTHER): Admitting: Nurse Practitioner

## 2023-11-17 ENCOUNTER — Ambulatory Visit (INDEPENDENT_AMBULATORY_CARE_PROVIDER_SITE_OTHER)

## 2023-11-17 VITALS — BP 133/60 | HR 71 | Resp 18

## 2023-11-17 DIAGNOSIS — L97211 Non-pressure chronic ulcer of right calf limited to breakdown of skin: Secondary | ICD-10-CM

## 2023-11-17 NOTE — Progress Notes (Signed)
 History of Present Illness  There is no documented history at this time  Assessments & Plan   There are no diagnoses linked to this encounter.    Additional instructions  Subjective:  Patient presents with venous ulcer of the Bilateral lower extremity.    Procedure:  3 layer unna wrap was placed Bilateral lower extremity.   Plan:   Follow up in one week.

## 2023-11-19 LAB — VAS US ABI WITH/WO TBI
Left ABI: 1.24
Right ABI: 0.96

## 2023-11-23 ENCOUNTER — Encounter (INDEPENDENT_AMBULATORY_CARE_PROVIDER_SITE_OTHER)

## 2023-11-24 ENCOUNTER — Encounter (INDEPENDENT_AMBULATORY_CARE_PROVIDER_SITE_OTHER): Payer: Self-pay | Admitting: Nurse Practitioner

## 2023-11-24 ENCOUNTER — Encounter (INDEPENDENT_AMBULATORY_CARE_PROVIDER_SITE_OTHER)

## 2023-11-24 ENCOUNTER — Ambulatory Visit (INDEPENDENT_AMBULATORY_CARE_PROVIDER_SITE_OTHER)

## 2023-11-24 VITALS — BP 117/68 | HR 66 | Resp 18

## 2023-11-24 DIAGNOSIS — L97211 Non-pressure chronic ulcer of right calf limited to breakdown of skin: Secondary | ICD-10-CM

## 2023-11-24 NOTE — Progress Notes (Signed)
 History of Present Illness  There is no documented history at this time  Assessments & Plan   There are no diagnoses linked to this encounter.    Additional instructions  Subjective:  Patient presents with venous ulcer of the Bilateral lower extremity.    Procedure:  3 layer unna wrap was placed Bilateral lower extremity.   Plan:   Follow up in one week.

## 2023-12-01 ENCOUNTER — Ambulatory Visit (INDEPENDENT_AMBULATORY_CARE_PROVIDER_SITE_OTHER): Admitting: Nurse Practitioner

## 2023-12-01 ENCOUNTER — Encounter (INDEPENDENT_AMBULATORY_CARE_PROVIDER_SITE_OTHER): Payer: Self-pay

## 2023-12-01 VITALS — BP 134/74 | HR 70 | Resp 18

## 2023-12-01 DIAGNOSIS — L97211 Non-pressure chronic ulcer of right calf limited to breakdown of skin: Secondary | ICD-10-CM

## 2023-12-01 NOTE — Progress Notes (Signed)
 History of Present Illness  There is no documented history at this time  Assessments & Plan   There are no diagnoses linked to this encounter.    Additional instructions  Subjective:  Patient presents with venous ulcer of the Bilateral lower extremity.    Procedure:  3 layer unna wrap was placed Bilateral lower extremity.   Plan:   Follow up in one week.

## 2023-12-08 ENCOUNTER — Encounter (INDEPENDENT_AMBULATORY_CARE_PROVIDER_SITE_OTHER): Payer: Self-pay | Admitting: Nurse Practitioner

## 2023-12-08 ENCOUNTER — Ambulatory Visit (INDEPENDENT_AMBULATORY_CARE_PROVIDER_SITE_OTHER): Admitting: Nurse Practitioner

## 2023-12-08 VITALS — BP 163/76 | HR 64 | Ht 68.0 in

## 2023-12-08 DIAGNOSIS — L97211 Non-pressure chronic ulcer of right calf limited to breakdown of skin: Secondary | ICD-10-CM

## 2023-12-08 DIAGNOSIS — I890 Lymphedema, not elsewhere classified: Secondary | ICD-10-CM

## 2023-12-08 DIAGNOSIS — I10 Essential (primary) hypertension: Secondary | ICD-10-CM

## 2023-12-08 NOTE — Progress Notes (Signed)
 Subjective:    Patient ID: [name] Ill, female    DOB: 02-Jun-1945, 78 y.o.   MRN: [id number] No chief complaint on file.   The patient returns today for follow-up evaluation of her lymphedema lower extremity swelling.  Her swelling is much improved.  Prior to her ulceration she was utilizing and undergoing conservative therapy fairly consistently.  He had a wound develop on her right great toe which is largely healed but there is a small shallow area that seems to have opened recently.  She also recently underwent ABIs which show right ABI 0.96 and a left 1.24.  This is comparable to previous study she had done about 2 years ago.  He has strong biphasic waveforms bilaterally with good toe waveforms bilaterally.    Review of Systems  Cardiovascular:  Positive for leg swelling.  Skin:  Positive for wound.  All other systems reviewed and are negative.      Objective:   Physical Exam Vitals reviewed.  HENT:     Head: Normocephalic.  Cardiovascular:     Rate and Rhythm: Normal rate.     Pulses: Normal pulses.  Pulmonary:     Effort: Pulmonary effort is normal.  Musculoskeletal:     Right lower leg: Edema present.     Left lower leg: Edema present.  Skin:    General: Skin is warm and dry.  Neurological:     Mental Status: She is alert and oriented to person, place, and time.  Psychiatric:        Mood and Affect: Mood normal.        Behavior: Behavior normal.        Thought Content: Thought content normal.        Judgment: Judgment normal.     BP (!) 163/76 Comment: pt not had bp meds yet  Pulse 64   Ht 5' 8 (1.727 m)   BMI 41.97 kg/m   Past Medical History:  Diagnosis Date   Anemia    as young child   Arthritis    back, leg and foot   Blood transfusion    1964   Carpal tunnel syndrome    Diabetes mellitus    oral medications   Hypertension     Social History   Socioeconomic History   Marital status: Married    Spouse name: Not on file   Number of  children: Not on file   Years of education: Not on file   Highest education level: Not on file  Occupational History   Not on file  Tobacco Use   Smoking status: Never   Smokeless tobacco: Never  Substance and Sexual Activity   Alcohol use: No   Drug use: No   Sexual activity: Not on file  Other Topics Concern   Not on file  Social History Narrative   Not on file   Social Drivers of Health   Financial Resource Strain: Low Risk  (11/12/2023)   Received from [facility]   Overall Financial Resource Strain (CARDIA)    Difficulty of Paying Living Expenses: Not hard at all  Food Insecurity: No Food Insecurity (11/12/2023)   Received from [facility]   Hunger Vital Sign    Within the past 12 months, you worried that your food would run out before you got the money to buy more.: Never true    Within the past 12 months, the food you bought just didn't last and you didn't have  money to get more.: Never true  Transportation Needs: No Transportation Needs (11/12/2023)   Received from [facility] - Transportation    In the past 12 months, has lack of transportation kept you from medical appointments or from getting medications?: No    Lack of Transportation (Non-Medical): No  Physical Activity: Not on file  Stress: Not on file  Social Connections: Not on file  Intimate Partner Violence: Not on file    Past Surgical History:  Procedure Laterality Date   ABDOMINAL HYSTERECTOMY     ANTERIOR CERVICAL DECOMP/DISCECTOMY FUSION  04/27/2011   Procedure: ANTERIOR CERVICAL DECOMPRESSION/DISCECTOMY FUSION 2 LEVELS;  Surgeon: Arley SHAUNNA Helling, MD;  Location: [facility];  Service: Neurosurgery;  Laterality: N/A;  Anterior Cervical Decompression/ Discectomy Fusion Cervical Three-Four, Cervical Four-Five   APPENDECTOMY     KNEE ARTHROPLASTY     right   PERIPHERAL VASCULAR CATHETERIZATION Left 10/29/2014   Procedure: Lower Extremity  Angiography;  Surgeon: Selinda GORMAN Gu, MD;  Location: [facility];  Service: Cardiovascular;  Laterality: Left;   PERIPHERAL VASCULAR CATHETERIZATION Left 10/29/2014   Procedure: Lower Extremity Intervention;  Surgeon: Selinda GORMAN Gu, MD;  Location: [facility];  Service: Cardiovascular;  Laterality: Left;   PERIPHERAL VASCULAR CATHETERIZATION Left 03/11/2015   Procedure: Lower Extremity Angiography;  Surgeon: Selinda GORMAN Gu, MD;  Location: [facility];  Service: Cardiovascular;  Laterality: Left;   PERIPHERAL VASCULAR CATHETERIZATION Left 03/11/2015   Procedure: Lower Extremity Intervention;  Surgeon: Selinda GORMAN Gu, MD;  Location: [facility];  Service: Cardiovascular;  Laterality: Left;    Family History  Problem Relation Age of Onset   Cancer Mother    Hypertension Mother    Diabetes Paternal Grandmother     Allergies  Allergen Reactions   Penicillins Shortness Of Breath and Swelling   Cheese Itching   Codeine Other (See Comments)    Ringing in ear TOLERATES Tramadol  Other reaction(s): Other (See Comments) RINGING IN THE EARS Ringing in ear TOLERATES Tramadol    Doxycycline  Nausea Only   Strawberry (Diagnostic) Hives   Strawberry Extract Hives       Latest Ref Rng & Units 06/09/2016    6:32 PM 08/10/2013    1:15 PM 04/22/2011    1:37 PM  CBC  WBC 3.6 - 11.0 K/uL 2.3  4.0  8.2   Hemoglobin 12.0 - 16.0 g/dL 88.1  88.4  87.5   Hematocrit 35.0 - 47.0 % 35.9  36.1  37.0   Platelets 150 - 440 K/uL 151  196  228       CMP     Component Value Date/Time   NA 139 06/09/2016 1832   NA 139 08/10/2013 1315   K 3.4 (L) 06/09/2016 1832   K 3.7 08/10/2013 1315   CL 100 (L) 06/09/2016 1832   CL 105 08/10/2013 1315   CO2 29 06/09/2016 1832   CO2 28 08/10/2013 1315   GLUCOSE 295 (H) 06/09/2016 1832   GLUCOSE 310 (H) 08/10/2013 1315   BUN 27 (H) 06/09/2016 1832   BUN 15 08/10/2013 1315   CREATININE 1.65 (H) 06/09/2016 1832   CREATININE 1.15 08/10/2013 1315    CALCIUM 8.3 (L) 06/09/2016 1832   CALCIUM 8.8 08/10/2013 1315   PROT 6.8 06/09/2016 1832   ALBUMIN 3.4 (L) 06/09/2016 1832   AST 41 06/09/2016 1832   ALT 28 06/09/2016 1832   ALKPHOS 73 06/09/2016 1832   BILITOT 0.4 06/09/2016  1832   GFRNONAA 30 (L) 06/09/2016 1832   GFRNONAA 49 (L) 08/10/2013 1315     VAS US  ABI WITH/WO TBI Result Date: 11/19/2023  LOWER EXTREMITY DOPPLER STUDY Patient Name:  [name]  Date of Exam:   11/17/2023 Medical Rec #: [id number]     Accession #:    [id number] Date of Birth: [birth year]     Patient Gender: F Patient Age:   34 years Exam Location:  [facility] Procedure:      VAS US  ABI WITH/WO TBI Referring Phys: Selinda Gu --------------------------------------------------------------------------------  Indications: Ulceration, and peripheral artery disease. High Risk Factors: Hypertension.  Comparison Study: 02/03/2022 Performing Technologist: Leafy Gibes RVS  Examination Guidelines: A complete evaluation includes at minimum, Doppler waveform signals and systolic blood pressure reading at the level of bilateral brachial, anterior tibial, and posterior tibial arteries, when vessel segments are accessible. Bilateral testing is considered an integral part of a complete examination. Photoelectric Plethysmograph (PPG) waveforms and toe systolic pressure readings are included as required and additional duplex testing as needed. Limited examinations for reoccurring indications may be performed as noted.  ABI Findings: +---------+------------------+-----+--------+--------+ Right    Rt Pressure (mmHg)IndexWaveformComment  +---------+------------------+-----+--------+--------+ Brachial 145                                     +---------+------------------+-----+--------+--------+ ATA      146               0.96 biphasic         +---------+------------------+-----+--------+--------+ PTA      139               0.91 biphasic          +---------+------------------+-----+--------+--------+ Great Toe155               1.02 Normal           +---------+------------------+-----+--------+--------+ +---------+------------------+-----+--------+-------+ Left     Lt Pressure (mmHg)IndexWaveformComment +---------+------------------+-----+--------+-------+ Brachial 152                                    +---------+------------------+-----+--------+-------+ ATA      188               1.24 biphasic        +---------+------------------+-----+--------+-------+ PTA      181               1.19 biphasic        +---------+------------------+-----+--------+-------+ Great Toe206               1.36 Normal          +---------+------------------+-----+--------+-------+ +-------+-----------+-----------+------------+------------+ ABI/TBIToday's ABIToday's TBIPrevious ABIPrevious TBI +-------+-----------+-----------+------------+------------+ Right  .96        1.02       .93         .67          +-------+-----------+-----------+------------+------------+ Left   1.24       1.36       1.21        .85          +-------+-----------+-----------+------------+------------+  Bilateral ABIs appear essentially unchanged compared to prior study on 02/03/2022. Bilateral TBIs appear increased compared to prior study on 02/03/2022.  Summary: Right: Resting right ankle-brachial index is within normal range. The right toe-brachial index is normal.  Left: Resting left  ankle-brachial index is within normal range. The left toe-brachial index is normal.  *See table(s) above for measurements and observations.  Electronically signed by Selinda Gu MD on 11/19/2023 at 9:41:07 AM.    Final        Assessment & Plan:   1. Lymphedema (Primary) Patient swelling is much improved from previous visits.  Based on this we will have her come out of the Unna boots and resume conservative therapy including use of compression elevation and activity.  She  is also advised to continue these for work as well.  Will have her return in 4 weeks or sooner if issues arise.  2. Essential hypertension, benign Continue antihypertensive medications as already ordered, these medications have been reviewed and there are no changes at this time.  3. Lower limb ulcer, calf, right, limited to breakdown of skin (HCC) She has a small shallow ulceration on her right foot.  I have instructed her to place Aquacel on her foot and this can be changed on a daily basis.  Will also evaluate progress for this wound at her 4-week follow-up.   Current Outpatient Medications on File Prior to Visit  Medication Sig Dispense Refill   ascorbic acid (VITAMIN C) 1000 MG tablet Take 1,000 mg by mouth daily.     aspirin  EC 81 MG tablet Take 81 mg by mouth daily.     celecoxib  (CELEBREX ) 200 MG capsule Take 200 mg by mouth daily.     clopidogrel  (PLAVIX ) 75 MG tablet Take 1 tablet (75 mg total) by mouth daily. 30 tablet 11   Docusate Calcium (STOOL SOFTENER PO) Take by mouth in the morning and at bedtime.     empagliflozin (JARDIANCE) 25 MG TABS tablet Take 25 mg by mouth daily.     furosemide (LASIX) 20 MG tablet Take 20 mg by mouth daily.     gabapentin (NEURONTIN) 100 MG capsule Take 100 mg by mouth 2 (two) times daily.     glipiZIDE  (GLUCOTROL  XL) 5 MG 24 hr tablet Take 5 mg by mouth 2 (two) times daily.     lisinopril  (ZESTRIL ) 20 MG tablet Take 20 mg by mouth daily.     lisinopril -hydrochlorothiazide  (PRINZIDE ,ZESTORETIC ) 20-25 MG per tablet Take 1 tablet by mouth daily.     metFORMIN  (GLUCOPHAGE ) 1000 MG tablet Take 1,000 mg by mouth 2 (two) times daily with a meal.     methocarbamol  (ROBAXIN ) 500 MG tablet Take 1 tablet (500 mg total) by mouth 4 (four) times daily. 20 tablet 0   ONETOUCH VERIO test strip 1 each 2 (two) times daily.     pregabalin (LYRICA) 25 MG capsule Take 50 mg by mouth 3 (three) times daily.     rosuvastatin (CRESTOR) 5 MG tablet Take 5 mg by mouth  daily.     Semaglutide (RYBELSUS) 7 MG TABS Take 7 mg by mouth daily.     traMADol  (ULTRAM ) 50 MG tablet Take 1 tablet (50 mg total) by mouth every 6 (six) hours as needed. 20 tablet 1   ciprofloxacin  (CIPRO ) 250 MG tablet Take 3 tablets (750 mg total) by mouth 2 (two) times daily. (Patient not taking: Reported on 12/08/2023) 60 tablet 0   cyanocobalamin 1000 MCG tablet Take by mouth. (Patient not taking: Reported on 12/01/2023)     doxycycline  (VIBRAMYCIN ) 100 MG capsule Take 1 capsule (100 mg total) by mouth 2 (two) times daily. (Patient not taking: Reported on 12/08/2023) 28 capsule 0   liraglutide (VICTOZA) 18 MG/3ML SOPN Inject  into the skin. (Patient not taking: Reported on 12/01/2023)     metoprolol  succinate (TOPROL -XL) 25 MG 24 hr tablet Take 1 tablet by mouth daily. (Patient not taking: Reported on 12/01/2023)     mupirocin  ointment (BACTROBAN ) 2 % Place 1 application on wound daily (Patient not taking: Reported on 12/01/2023) 22 g 0   OZEMPIC, 0.25 OR 0.5 MG/DOSE, 2 MG/1.5ML SOPN Inject into the skin. (Patient not taking: Reported on 12/01/2023)     No current facility-administered medications on file prior to visit.    There are no Patient Instructions on file for this visit. No follow-ups on file.   Ebonie Westerlund E Jenna Ardoin, NP

## 2023-12-09 ENCOUNTER — Encounter (INDEPENDENT_AMBULATORY_CARE_PROVIDER_SITE_OTHER): Payer: Self-pay | Admitting: Nurse Practitioner

## 2023-12-27 ENCOUNTER — Telehealth (INDEPENDENT_AMBULATORY_CARE_PROVIDER_SITE_OTHER): Payer: Self-pay

## 2023-12-27 NOTE — Telephone Encounter (Signed)
 Patients grandson left message on nurse line at this time stating [name] was having a flare up with her legs again, she has 8/10 pain at this time and blisters. He stated the pain and discomfort kept her up last night and was requesting for her legs to go back in wraps at this time. Please advise.

## 2023-12-27 NOTE — Telephone Encounter (Signed)
 Patient is scheduled to come in tomorrow at 10am for unna wraps.Patient grandson was notified with appt

## 2023-12-28 ENCOUNTER — Encounter (INDEPENDENT_AMBULATORY_CARE_PROVIDER_SITE_OTHER): Payer: Self-pay

## 2023-12-28 ENCOUNTER — Ambulatory Visit (INDEPENDENT_AMBULATORY_CARE_PROVIDER_SITE_OTHER): Admitting: Nurse Practitioner

## 2023-12-28 DIAGNOSIS — L97211 Non-pressure chronic ulcer of right calf limited to breakdown of skin: Secondary | ICD-10-CM

## 2023-12-28 MED ORDER — SULFAMETHOXAZOLE-TRIMETHOPRIM 400-80 MG PO TABS: 1.0000 | ORAL_TABLET | Freq: Two times a day (BID) | ORAL | 0 refills | Status: AC

## 2023-12-28 NOTE — Progress Notes (Signed)
 History of Present Illness  There is no documented history at this time  Assessments & Plan   1. Lower limb ulcer, calf, right, limited to breakdown of skin (HCC)      Additional instructions  Subjective:  Patient presents with venous ulcer of the Bilateral lower extremity.    Procedure:  3 layer unna wrap was placed Bilateral lower extremity.   Plan:   Follow up in one week.

## 2024-01-01 ENCOUNTER — Encounter (INDEPENDENT_AMBULATORY_CARE_PROVIDER_SITE_OTHER): Payer: Self-pay | Admitting: Nurse Practitioner

## 2024-01-05 ENCOUNTER — Ambulatory Visit (INDEPENDENT_AMBULATORY_CARE_PROVIDER_SITE_OTHER): Admitting: Nurse Practitioner

## 2024-01-05 ENCOUNTER — Encounter (INDEPENDENT_AMBULATORY_CARE_PROVIDER_SITE_OTHER): Payer: Self-pay

## 2024-01-05 VITALS — BP 156/68 | HR 67 | Resp 16 | Ht 68.0 in

## 2024-01-05 DIAGNOSIS — L97211 Non-pressure chronic ulcer of right calf limited to breakdown of skin: Secondary | ICD-10-CM

## 2024-01-05 NOTE — Progress Notes (Signed)
 History of Present Illness  There is no documented history at this time  Assessments & Plan   There are no diagnoses linked to this encounter.    Additional instructions  Subjective:  Patient presents with venous ulcer of the Bilateral lower extremity.    Procedure:  3 layer unna wrap was placed Bilateral lower extremity.  Patient presented for unna wraps, legs cleansed with antimicrobial soap and water. Blistered areas covered with xeroform and pink unna applied to both legs prior to coban.   Plan:   Follow up in one week.

## 2024-01-11 ENCOUNTER — Ambulatory Visit (INDEPENDENT_AMBULATORY_CARE_PROVIDER_SITE_OTHER): Admitting: Nurse Practitioner

## 2024-01-11 ENCOUNTER — Encounter (INDEPENDENT_AMBULATORY_CARE_PROVIDER_SITE_OTHER): Payer: Self-pay | Admitting: Nurse Practitioner

## 2024-01-11 ENCOUNTER — Other Ambulatory Visit (INDEPENDENT_AMBULATORY_CARE_PROVIDER_SITE_OTHER): Payer: Self-pay | Admitting: Nurse Practitioner

## 2024-01-11 VITALS — BP 194/70 | HR 70 | Resp 18

## 2024-01-11 DIAGNOSIS — I10 Essential (primary) hypertension: Secondary | ICD-10-CM

## 2024-01-11 DIAGNOSIS — I890 Lymphedema, not elsewhere classified: Secondary | ICD-10-CM | POA: Diagnosis not present

## 2024-01-11 DIAGNOSIS — L97211 Non-pressure chronic ulcer of right calf limited to breakdown of skin: Secondary | ICD-10-CM

## 2024-01-17 ENCOUNTER — Telehealth (INDEPENDENT_AMBULATORY_CARE_PROVIDER_SITE_OTHER): Payer: Self-pay

## 2024-01-17 ENCOUNTER — Encounter (INDEPENDENT_AMBULATORY_CARE_PROVIDER_SITE_OTHER): Payer: Self-pay | Admitting: Nurse Practitioner

## 2024-01-17 LAB — AEROBIC CULTURE

## 2024-01-17 MED ORDER — CIPROFLOXACIN HCL 250 MG PO TABS: 750.0000 mg | ORAL_TABLET | Freq: Two times a day (BID) | ORAL | 0 refills | Status: AC

## 2024-01-17 NOTE — Progress Notes (Signed)
 Subjective:    Patient ID: [name] Ill, female    DOB: [birth year], 78 y.o.   MRN: [id number] Chief Complaint  Patient presents with   Follow-up    Unna follow up    HPI  Discussed the use of AI scribe software for clinical note transcription with the patient, who gave verbal consent to proceed.  History of Present Illness [name] is a 78 year old female who presents with worsening leg blisters and swelling.  She has worsening blisters and swelling on her legs, which are sore and painful. The blisters were noticed under the wrap when it was removed.  She has been using wraps and elevating her legs but has not been using her pump recently due to the wraps. She mentions not consuming anything particularly salty recently, only grapes and nuts.  Her medication history includes the use of Lasix, which is being changed to a new diuretic. She has previously been on Bactrim , which caused nausea, and Cipro , which she tolerated better. She recently completed a course of Bactrim  on Saturday.  No blisters are present elsewhere on her body, and the blisters are localized to her legs.    Results Procedure: Wound care with Xeroform application Description: Xeroform applied over larger blister areas   Review of Systems  Cardiovascular:  Positive for leg swelling.  Skin:  Positive for wound.  All other systems reviewed and are negative.      Objective:   Physical Exam Vitals reviewed.  HENT:     Head: Normocephalic.  Cardiovascular:     Rate and Rhythm: Normal rate.  Pulmonary:     Effort: Pulmonary effort is normal.  Musculoskeletal:     Right lower leg: Edema present.     Left lower leg: Edema present.  Skin:    General: Skin is warm and dry.  Neurological:     Mental Status: She is alert and oriented to person, place, and time.  Psychiatric:        Mood and Affect: Mood normal.        Behavior: Behavior normal.        Thought Content: Thought content normal.         Judgment: Judgment normal.     Physical Exam SKIN: Blisters and crinkly appearance on the skin of the legs.  BP (!) 194/70 (BP Location: Right Arm)   Pulse 70   Resp 18   Past Medical History:  Diagnosis Date   Anemia    as young child   Arthritis    back, leg and foot   Blood transfusion    1964   Carpal tunnel syndrome    Diabetes mellitus    oral medications   Hypertension     Social History   Socioeconomic History   Marital status: Married    Spouse name: Not on file   Number of children: Not on file   Years of education: Not on file   Highest education level: Not on file  Occupational History   Not on file  Tobacco Use   Smoking status: Never   Smokeless tobacco: Never  Substance and Sexual Activity   Alcohol use: No   Drug use: No   Sexual activity: Not on file  Other Topics Concern   Not on file  Social History Narrative   Not on file   Social Drivers of Health   Financial Resource Strain: Low Risk  (11/12/2023)   Received from [facility]  Overall Financial Resource Strain (CARDIA)    Difficulty of Paying Living Expenses: Not hard at all  Food Insecurity: No Food Insecurity (11/12/2023)   Received from [facility]   Hunger Vital Sign    Within the past 12 months, you worried that your food would run out before you got the money to buy more.: Never true    Within the past 12 months, the food you bought just didn't last and you didn't have money to get more.: Never true  Transportation Needs: No Transportation Needs (11/12/2023)   Received from [facility]    In the past 12 months, has lack of transportation kept you from medical appointments or from getting medications?: No    Lack of Transportation (Non-Medical): No  Physical Activity: Not on file  Stress: Not on file  Social Connections: Not on file  Intimate Partner Violence: Not on file    Past Surgical  History:  Procedure Laterality Date   ABDOMINAL HYSTERECTOMY     ANTERIOR CERVICAL DECOMP/DISCECTOMY FUSION  04/27/2011   Procedure: ANTERIOR CERVICAL DECOMPRESSION/DISCECTOMY FUSION 2 LEVELS;  Surgeon: Arley SHAUNNA Helling, MD;  Location: [facility];  Service: Neurosurgery;  Laterality: N/A;  Anterior Cervical Decompression/ Discectomy Fusion Cervical Three-Four, Cervical Four-Five   APPENDECTOMY     KNEE ARTHROPLASTY     right   PERIPHERAL VASCULAR CATHETERIZATION Left 10/29/2014   Procedure: Lower Extremity Angiography;  Surgeon: Selinda GORMAN Gu, MD;  Location: [facility];  Service: Cardiovascular;  Laterality: Left;   PERIPHERAL VASCULAR CATHETERIZATION Left 10/29/2014   Procedure: Lower Extremity Intervention;  Surgeon: Selinda GORMAN Gu, MD;  Location: [facility];  Service: Cardiovascular;  Laterality: Left;   PERIPHERAL VASCULAR CATHETERIZATION Left 03/11/2015   Procedure: Lower Extremity Angiography;  Surgeon: Selinda GORMAN Gu, MD;  Location: [facility];  Service: Cardiovascular;  Laterality: Left;   PERIPHERAL VASCULAR CATHETERIZATION Left 03/11/2015   Procedure: Lower Extremity Intervention;  Surgeon: Selinda GORMAN Gu, MD;  Location: [facility];  Service: Cardiovascular;  Laterality: Left;    Family History  Problem Relation Age of Onset   Cancer Mother    Hypertension Mother    Diabetes Paternal Grandmother     Allergies  Allergen Reactions   Penicillins Shortness Of Breath and Swelling   Cheese Itching   Codeine Other (See Comments)    Ringing in ear TOLERATES Tramadol  Other reaction(s): Other (See Comments) RINGING IN THE EARS Ringing in ear TOLERATES Tramadol    Doxycycline  Nausea Only   Strawberry (Diagnostic) Hives   Strawberry Extract Hives       Latest Ref Rng & Units 06/09/2016    6:32 PM 08/10/2013    1:15 PM 04/22/2011    1:37 PM  CBC  WBC 3.6 - 11.0 K/uL 2.3  4.0  8.2   Hemoglobin 12.0 - 16.0 g/dL 88.1  88.4  87.5   Hematocrit 35.0 - 47.0 % 35.9   36.1  37.0   Platelets 150 - 440 K/uL 151  196  228       CMP     Component Value Date/Time   NA 139 06/09/2016 1832   NA 139 08/10/2013 1315   K 3.4 (L) 06/09/2016 1832   K 3.7 08/10/2013 1315   CL 100 (L) 06/09/2016 1832   CL 105 08/10/2013 1315   CO2 29 06/09/2016 1832   CO2 28 08/10/2013 1315   GLUCOSE 295 (H) 06/09/2016 1832  GLUCOSE 310 (H) 08/10/2013 1315   BUN 27 (H) 06/09/2016 1832   BUN 15 08/10/2013 1315   CREATININE 1.65 (H) 06/09/2016 1832   CREATININE 1.15 08/10/2013 1315   CALCIUM 8.3 (L) 06/09/2016 1832   CALCIUM 8.8 08/10/2013 1315   PROT 6.8 06/09/2016 1832   ALBUMIN 3.4 (L) 06/09/2016 1832   AST 41 06/09/2016 1832   ALT 28 06/09/2016 1832   ALKPHOS 73 06/09/2016 1832   BILITOT 0.4 06/09/2016 1832   GFRNONAA 30 (L) 06/09/2016 1832   GFRNONAA 49 (L) 08/10/2013 1315     No results found.     Assessment & Plan:   1. 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 Previous Bactrim  caused nausea. Cipro  well-tolerated previously. - Cultured the ulcer to identify bacterial growth. - Prescribed Cipro  for infection management. - Applied zero form dressing over larger blister areas. - Reapplied compression wraps to the affected area.  1. Lower limb ulcer, calf, right, limited to breakdown of skin (HCC) (Primary)   2. Lymphedema Lymphedema Chronic lymphedema. Compression pump previously beneficial. - Initiated new diuretic therapy as prescribed by primary care physician. - Encouraged use of compression pump when feasible. - Advised on dietary modifications to reduce salt intake.   2. Essential hypertension, benign Continue antihypertensive medications as already ordered, these medications have been reviewed and there are no changes at this time.     Current Outpatient Medications on File Prior to Visit  Medication  Sig Dispense Refill   ascorbic acid (VITAMIN C) 1000 MG tablet Take 1,000 mg by mouth daily.     aspirin  EC 81 MG tablet Take 81 mg by mouth daily.     celecoxib  (CELEBREX ) 200 MG capsule Take 200 mg by mouth daily.     clopidogrel  (PLAVIX ) 75 MG tablet Take 1 tablet (75 mg total) by mouth daily. 30 tablet 11   cyanocobalamin 1000 MCG tablet Take by mouth. (Patient not taking: Reported on 12/28/2023)     Docusate Calcium (STOOL SOFTENER PO) Take by mouth in the morning and at bedtime.     empagliflozin (JARDIANCE) 25 MG TABS tablet Take 25 mg by mouth daily.     furosemide (LASIX) 20 MG tablet Take 20 mg by mouth daily.     gabapentin (NEURONTIN) 100 MG capsule Take 100 mg by mouth 2 (two) times daily.     glipiZIDE  (GLUCOTROL  XL) 5 MG 24 hr tablet Take 5 mg by mouth 2 (two) times daily.     liraglutide (VICTOZA) 18 MG/3ML SOPN Inject into the skin. (Patient not taking: Reported on 12/28/2023)     lisinopril  (ZESTRIL ) 20 MG tablet Take 20 mg by mouth daily.     lisinopril -hydrochlorothiazide  (PRINZIDE ,ZESTORETIC ) 20-25 MG per tablet Take 1 tablet by mouth daily.     metFORMIN  (GLUCOPHAGE ) 1000 MG tablet Take 1,000 mg by mouth 2 (two) times daily with a meal.     methocarbamol  (ROBAXIN ) 500 MG tablet Take 1 tablet (500 mg total) by mouth 4 (four) times daily. 20 tablet 0   metoprolol  succinate (TOPROL -XL) 25 MG 24 hr tablet Take 1 tablet by mouth daily. (Patient not taking: Reported on 12/28/2023)     mupirocin  ointment (BACTROBAN ) 2 % Place 1 application on wound daily (Patient not taking: Reported on 12/28/2023) 22 g 0   ONETOUCH VERIO test strip 1 each 2 (two) times daily.     OZEMPIC,  0.25 OR 0.5 MG/DOSE, 2 MG/1.5ML SOPN Inject into the skin. (Patient not taking: Reported on 12/28/2023)     pregabalin (LYRICA) 25 MG capsule Take 50 mg by mouth 3 (three) times daily.     rosuvastatin (CRESTOR) 5 MG tablet Take 5 mg by mouth daily.     Semaglutide (RYBELSUS) 7 MG TABS Take 7 mg by mouth daily.      sulfamethoxazole -trimethoprim  (BACTRIM ) 400-80 MG tablet Take 1 tablet by mouth 2 (two) times daily. 20 tablet 0   traMADol  (ULTRAM ) 50 MG tablet Take 1 tablet (50 mg total) by mouth every 6 (six) hours as needed. 20 tablet 1   No current facility-administered medications on file prior to visit.    There are no Patient Instructions on file for this visit. No follow-ups on file.   Randale Carvalho E Zaylin Pistilli, NP

## 2024-01-17 NOTE — Telephone Encounter (Signed)
 Notified patient at this time and made her aware that her prescription is at the drug store.

## 2024-01-18 ENCOUNTER — Encounter (INDEPENDENT_AMBULATORY_CARE_PROVIDER_SITE_OTHER): Payer: Self-pay | Admitting: Nurse Practitioner

## 2024-01-18 ENCOUNTER — Ambulatory Visit (INDEPENDENT_AMBULATORY_CARE_PROVIDER_SITE_OTHER): Admitting: Nurse Practitioner

## 2024-01-18 VITALS — BP 126/69 | HR 89 | Resp 18

## 2024-01-18 DIAGNOSIS — L97211 Non-pressure chronic ulcer of right calf limited to breakdown of skin: Secondary | ICD-10-CM | POA: Diagnosis not present

## 2024-01-18 NOTE — Progress Notes (Signed)
 History of Present Illness  There is no documented history at this time  Assessments & Plan   There are no diagnoses linked to this encounter.    Additional instructions  Subjective:  Patient presents with venous ulcer of the Bilateral lower extremity.    Procedure:  3 layer unna wrap was placed Bilateral lower extremity.   Plan:   Follow up in one week.

## 2024-01-25 ENCOUNTER — Ambulatory Visit (INDEPENDENT_AMBULATORY_CARE_PROVIDER_SITE_OTHER): Admitting: Nurse Practitioner

## 2024-01-25 ENCOUNTER — Encounter (INDEPENDENT_AMBULATORY_CARE_PROVIDER_SITE_OTHER): Payer: Self-pay

## 2024-01-25 DIAGNOSIS — L97211 Non-pressure chronic ulcer of right calf limited to breakdown of skin: Secondary | ICD-10-CM | POA: Diagnosis not present

## 2024-01-25 NOTE — Progress Notes (Signed)
 History of Present Illness  There is no documented history at this time  Assessments & Plan   There are no diagnoses linked to this encounter.    Additional instructions  Subjective:  Patient presents with venous ulcer of the Bilateral lower extremity.    Procedure:  3 layer unna wrap was placed Bilateral lower extremity.   Plan:   Follow up in one week.

## 2024-02-01 ENCOUNTER — Encounter (INDEPENDENT_AMBULATORY_CARE_PROVIDER_SITE_OTHER): Payer: Self-pay

## 2024-02-01 ENCOUNTER — Ambulatory Visit (INDEPENDENT_AMBULATORY_CARE_PROVIDER_SITE_OTHER): Admitting: Nurse Practitioner

## 2024-02-01 VITALS — BP 112/61 | HR 71 | Resp 18

## 2024-02-01 DIAGNOSIS — L97211 Non-pressure chronic ulcer of right calf limited to breakdown of skin: Secondary | ICD-10-CM | POA: Diagnosis not present

## 2024-02-01 NOTE — Progress Notes (Signed)
 History of Present Illness  There is no documented history at this time  Assessments & Plan   There are no diagnoses linked to this encounter.    Additional instructions  Subjective:  Patient presents with venous ulcer of the Bilateral lower extremity.    Procedure:  3 layer unna wrap was placed Bilateral lower extremity.   Plan:   Follow up in one week.

## 2024-02-08 ENCOUNTER — Ambulatory Visit (INDEPENDENT_AMBULATORY_CARE_PROVIDER_SITE_OTHER): Admitting: Nurse Practitioner

## 2024-02-08 ENCOUNTER — Encounter (INDEPENDENT_AMBULATORY_CARE_PROVIDER_SITE_OTHER): Payer: Self-pay | Admitting: Nurse Practitioner

## 2024-02-08 VITALS — BP 121/68 | HR 70 | Resp 18

## 2024-02-08 DIAGNOSIS — L97211 Non-pressure chronic ulcer of right calf limited to breakdown of skin: Secondary | ICD-10-CM | POA: Diagnosis not present

## 2024-02-08 NOTE — Progress Notes (Signed)
 History of Present Illness  There is no documented history at this time  Assessments & Plan   There are no diagnoses linked to this encounter.    Additional instructions  Subjective:  Patient presents with venous ulcer of the Bilateral lower extremity.    Procedure:  3 layer unna wrap was placed Bilateral lower extremity.   Plan:   Follow up in one week.

## 2024-02-15 ENCOUNTER — Encounter (INDEPENDENT_AMBULATORY_CARE_PROVIDER_SITE_OTHER): Payer: Self-pay | Admitting: Nurse Practitioner

## 2024-02-15 ENCOUNTER — Ambulatory Visit (INDEPENDENT_AMBULATORY_CARE_PROVIDER_SITE_OTHER): Admitting: Nurse Practitioner

## 2024-02-15 VITALS — BP 127/74 | HR 69 | Resp 18

## 2024-02-15 DIAGNOSIS — I890 Lymphedema, not elsewhere classified: Secondary | ICD-10-CM | POA: Diagnosis not present

## 2024-02-15 DIAGNOSIS — I10 Essential (primary) hypertension: Secondary | ICD-10-CM

## 2024-02-15 DIAGNOSIS — L97211 Non-pressure chronic ulcer of right calf limited to breakdown of skin: Secondary | ICD-10-CM

## 2024-02-20 ENCOUNTER — Encounter (INDEPENDENT_AMBULATORY_CARE_PROVIDER_SITE_OTHER): Payer: Self-pay | Admitting: Nurse Practitioner

## 2024-02-20 NOTE — Progress Notes (Signed)
 "  Subjective:    Patient ID: [name], female    DOB: [birth year], 79 y.o.   MRN: [id number] Chief Complaint  Patient presents with   Follow-up    4 week unna wrap    HPI  Discussed the use of AI scribe software for clinical note transcription with the patient, who gave verbal consent to proceed.  History of Present Illness [name] is a 79 year old female with chronic right calf ulcer and lymphedema who presents for follow-up of lower extremity wounds.  She has chronic wounds of the lower extremities, previously characterized by multiple small blisters. Since her last visit, the wounds have improved, with resolution of blisters and the skin now appearing dry. She describes her legs as feeling tight, which she attributes to skin dryness. She notes persistent cosmetic concerns, though the appearance is improved compared to prior visits.  She continues to be managed with leg wraps. At a previous visit, the wraps were removed and her skin condition subsequently worsened. She is currently maintained in wraps and has not reported any new blisters since the last visit.  She does not have significant discomfort in her feet or toes, but notes that her toes feel cold, which she attributes to cold weather and lack of insulation. No other complaints regarding her feet or toes are reported at this time.    Results     Review of Systems  Cardiovascular:  Positive for leg swelling.  Neurological:  Positive for weakness.  All other systems reviewed and are negative.      Objective:   Physical Exam Vitals reviewed.  HENT:     Head: Normocephalic.  Cardiovascular:     Rate and Rhythm: Normal rate.  Pulmonary:     Effort: Pulmonary effort is normal.  Skin:    General: Skin is warm and dry.  Neurological:     Mental Status: She is alert and oriented to person, place, and time.     Gait: Gait abnormal.  Psychiatric:        Mood and Affect: Mood normal.        Behavior: Behavior  normal.        Thought Content: Thought content normal.        Judgment: Judgment normal.     Physical Exam    BP 127/74 (BP Location: Right Arm)   Pulse 69   Resp 18   Past Medical History:  Diagnosis Date   Anemia    as young child   Arthritis    back, leg and foot   Blood transfusion    1964   Carpal tunnel syndrome    Diabetes mellitus    oral medications   Hypertension     Social History   Socioeconomic History   Marital status: Married    Spouse name: Not on file   Number of children: Not on file   Years of education: Not on file   Highest education level: Not on file  Occupational History   Not on file  Tobacco Use   Smoking status: Never   Smokeless tobacco: Never  Substance and Sexual Activity   Alcohol use: No   Drug use: No   Sexual activity: Not on file  Other Topics Concern   Not on file  Social History Narrative   Not on file   Social Drivers of Health   Tobacco Use: Low Risk (02/15/2024)   Patient History    Smoking Tobacco Use: Never  Smokeless Tobacco Use: Never    Passive Exposure: Not on file  Financial Resource Strain: Low Risk  (11/12/2023)   Received from [facility]   Overall Financial Resource Strain (CARDIA)    Difficulty of Paying Living Expenses: Not hard at all  Food Insecurity: No Food Insecurity (11/12/2023)   Received from [facility]   Epic    Within the past 12 months, you worried that your food would run out before you got the money to buy more.: Never true    Within the past 12 months, the food you bought just didn't last and you didn't have money to get more.: Never true  Transportation Needs: No Transportation Needs (11/12/2023)   Received from [facility] - Transportation    In the past 12 months, has lack of transportation kept you from medical appointments or from getting medications?: No    Lack of Transportation (Non-Medical): No  Physical  Activity: Not on file  Stress: Not on file  Social Connections: Not on file  Intimate Partner Violence: Not on file  Depression (EYV7-0): Not on file  Alcohol Screen: Not on file  Housing: Low Risk  (11/12/2023)   Received from [facility]   Epic    In the last 12 months, was there a time when you were not able to pay the mortgage or rent on time?: No    In the past 12 months, how many times have you moved where you were living?: 0    At any time in the past 12 months, were you homeless or living in a shelter (including now)?: No  Utilities: Not At Risk (11/12/2023)   Received from [facility]   Epic    In the past 12 months has the electric, gas, oil, or water company threatened to shut off services in your home?: No  Health Literacy: Not on file    Past Surgical History:  Procedure Laterality Date   ABDOMINAL HYSTERECTOMY     ANTERIOR CERVICAL DECOMP/DISCECTOMY FUSION  04/27/2011   Procedure: ANTERIOR CERVICAL DECOMPRESSION/DISCECTOMY FUSION 2 LEVELS;  Surgeon: Arley SHAUNNA Helling, MD;  Location: [facility];  Service: Neurosurgery;  Laterality: N/A;  Anterior Cervical Decompression/ Discectomy Fusion Cervical Three-Four, Cervical Four-Five   APPENDECTOMY     KNEE ARTHROPLASTY     right   PERIPHERAL VASCULAR CATHETERIZATION Left 10/29/2014   Procedure: Lower Extremity Angiography;  Surgeon: Selinda GORMAN Gu, MD;  Location: [facility];  Service: Cardiovascular;  Laterality: Left;   PERIPHERAL VASCULAR CATHETERIZATION Left 10/29/2014   Procedure: Lower Extremity Intervention;  Surgeon: Selinda GORMAN Gu, MD;  Location: [facility];  Service: Cardiovascular;  Laterality: Left;   PERIPHERAL VASCULAR CATHETERIZATION Left 03/11/2015   Procedure: Lower Extremity Angiography;  Surgeon: Selinda GORMAN Gu, MD;  Location: [facility];  Service: Cardiovascular;  Laterality: Left;   PERIPHERAL VASCULAR CATHETERIZATION Left 03/11/2015   Procedure: Lower  Extremity Intervention;  Surgeon: Selinda GORMAN Gu, MD;  Location: [facility];  Service: Cardiovascular;  Laterality: Left;    Family History  Problem Relation Age of Onset   Cancer Mother    Hypertension Mother    Diabetes Paternal Grandmother     Allergies[1]     Latest Ref Rng & Units 06/09/2016    6:32 PM 08/10/2013    1:15 PM 04/22/2011    1:37 PM  CBC  WBC 3.6 - 11.0 K/uL  2.3  4.0  8.2   Hemoglobin 12.0 - 16.0 g/dL 88.1  88.4  87.5   Hematocrit 35.0 - 47.0 % 35.9  36.1  37.0   Platelets 150 - 440 K/uL 151  196  228       CMP     Component Value Date/Time   NA 139 06/09/2016 1832   NA 139 08/10/2013 1315   K 3.4 (L) 06/09/2016 1832   K 3.7 08/10/2013 1315   CL 100 (L) 06/09/2016 1832   CL 105 08/10/2013 1315   CO2 29 06/09/2016 1832   CO2 28 08/10/2013 1315   GLUCOSE 295 (H) 06/09/2016 1832   GLUCOSE 310 (H) 08/10/2013 1315   BUN 27 (H) 06/09/2016 1832   BUN 15 08/10/2013 1315   CREATININE 1.65 (H) 06/09/2016 1832   CREATININE 1.15 08/10/2013 1315   CALCIUM 8.3 (L) 06/09/2016 1832   CALCIUM 8.8 08/10/2013 1315   PROT 6.8 06/09/2016 1832   ALBUMIN 3.4 (L) 06/09/2016 1832   AST 41 06/09/2016 1832   ALT 28 06/09/2016 1832   ALKPHOS 73 06/09/2016 1832   BILITOT 0.4 06/09/2016 1832   GFRNONAA 30 (L) 06/09/2016 1832   GFRNONAA 49 (L) 08/10/2013 1315     No results found.     Assessment & Plan:   1. 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 Continued management required to promote healing and prevent recurrence. - Continued leg wraps for several more weeks to support healing and facilitate debridement. - Scheduled follow-up in a few weeks to reassess ulcer healing.  2. Lymphedema (Primary) Lymphedema Lymphedema persists, contributing to skin tightness and dryness. Current management with wraps effective. - Instructed  her to elevate her feet when sitting, especially while watching television. - Continued use of wraps to manage lymphedema.  3. Essential hypertension, benign Continue antihypertensive medications as already ordered, these medications have been reviewed and there are no changes at this time.   Assessment and Plan Assessment & Plan       Medications Ordered Prior to Encounter[2]  There are no Patient Instructions on file for this visit. No follow-ups on file.   [name] Vandemark E Smitty Ackerley, NP      [1]  Allergies Allergen Reactions   Penicillins Shortness Of Breath and Swelling   Cheese Itching   Codeine Other (See Comments)    Ringing in ear TOLERATES Tramadol  Other reaction(s): Other (See Comments) RINGING IN THE EARS Ringing in ear TOLERATES Tramadol    Doxycycline  Nausea Only   Strawberry (Diagnostic) Hives   Strawberry Extract Hives  [2]  Current Outpatient Medications on File Prior to Visit  Medication Sig Dispense Refill   ascorbic acid (VITAMIN C) 1000 MG tablet Take 1,000 mg by mouth daily. (Patient taking differently: Take 500 mg by mouth daily.)     aspirin  EC 81 MG tablet Take 81 mg by mouth daily.     ciprofloxacin  (CIPRO ) 250 MG tablet Take 3 tablets (750 mg total) by mouth 2 (two) times daily. 60 tablet 0   clopidogrel  (PLAVIX ) 75 MG tablet Take 1 tablet (75 mg total) by mouth daily. 30 tablet 11   Docusate Calcium (STOOL SOFTENER PO) Take by mouth in the morning and at bedtime.     empagliflozin (JARDIANCE) 25 MG TABS tablet Take 25 mg by mouth daily.     gabapentin (NEURONTIN) 100 MG capsule Take 100 mg by  mouth 2 (two) times daily.     glipiZIDE  (GLUCOTROL  XL) 5 MG 24 hr tablet Take 5 mg by mouth 2 (two) times daily.     lisinopril  (ZESTRIL ) 20 MG tablet Take 20 mg by mouth daily.     lisinopril -hydrochlorothiazide  (PRINZIDE ,ZESTORETIC ) 20-25 MG per tablet Take 1 tablet by mouth daily.     metFORMIN  (GLUCOPHAGE ) 1000 MG tablet Take 1,000 mg by mouth 2 (two) times  daily with a meal.     metoprolol  succinate (TOPROL -XL) 25 MG 24 hr tablet Take 1 tablet by mouth daily.     ONETOUCH VERIO test strip 1 each 2 (two) times daily.     rosuvastatin (CRESTOR) 5 MG tablet Take 5 mg by mouth daily. (Patient taking differently: Take 10 mg by mouth daily.)     Semaglutide (RYBELSUS) 7 MG TABS Take 7 mg by mouth daily.     sodium bicarbonate  650 MG tablet Take 650 mg by mouth 2 (two) times daily.     torsemide (DEMADEX) 20 MG tablet Take 20-40 mg by mouth.     traMADol  (ULTRAM ) 50 MG tablet Take 1 tablet (50 mg total) by mouth every 6 (six) hours as needed. 20 tablet 1   celecoxib  (CELEBREX ) 200 MG capsule Take 200 mg by mouth daily. (Patient not taking: Reported on 02/15/2024)     cyanocobalamin 1000 MCG tablet Take by mouth. (Patient not taking: Reported on 02/15/2024)     furosemide (LASIX) 20 MG tablet Take 20 mg by mouth daily. (Patient not taking: Reported on 02/15/2024)     liraglutide (VICTOZA) 18 MG/3ML SOPN Inject into the skin. (Patient not taking: Reported on 02/15/2024)     methocarbamol  (ROBAXIN ) 500 MG tablet Take 1 tablet (500 mg total) by mouth 4 (four) times daily. (Patient not taking: Reported on 02/15/2024) 20 tablet 0   mupirocin  ointment (BACTROBAN ) 2 % Place 1 application on wound daily (Patient not taking: Reported on 02/15/2024) 22 g 0   OZEMPIC, 0.25 OR 0.5 MG/DOSE, 2 MG/1.5ML SOPN Inject into the skin. (Patient not taking: Reported on 02/15/2024)     pregabalin (LYRICA) 25 MG capsule Take 50 mg by mouth 3 (three) times daily. (Patient not taking: Reported on 02/15/2024)     sulfamethoxazole -trimethoprim  (BACTRIM ) 400-80 MG tablet Take 1 tablet by mouth 2 (two) times daily. (Patient not taking: Reported on 02/15/2024) 20 tablet 0   No current facility-administered medications on file prior to visit.   "

## 2024-02-23 ENCOUNTER — Ambulatory Visit (INDEPENDENT_AMBULATORY_CARE_PROVIDER_SITE_OTHER): Admitting: Nurse Practitioner

## 2024-02-23 DIAGNOSIS — L97211 Non-pressure chronic ulcer of right calf limited to breakdown of skin: Secondary | ICD-10-CM

## 2024-02-23 NOTE — Progress Notes (Signed)
 History of Present Illness  There is no documented history at this time  Assessments & Plan   There are no diagnoses linked to this encounter.    Additional instructions  Subjective:  Patient presents with venous ulcer of the Bilateral lower extremity.    Procedure:  3 layer unna wrap was placed Bilateral lower extremity.   Plan:   Follow up in one week.

## 2024-02-27 ENCOUNTER — Encounter (INDEPENDENT_AMBULATORY_CARE_PROVIDER_SITE_OTHER): Payer: Self-pay | Admitting: Nurse Practitioner

## 2024-03-01 ENCOUNTER — Ambulatory Visit (INDEPENDENT_AMBULATORY_CARE_PROVIDER_SITE_OTHER): Admitting: Nurse Practitioner

## 2024-03-01 VITALS — BP 132/67 | HR 72

## 2024-03-01 DIAGNOSIS — L97211 Non-pressure chronic ulcer of right calf limited to breakdown of skin: Secondary | ICD-10-CM

## 2024-03-01 NOTE — Progress Notes (Signed)
 History of Present Illness  There is no documented history at this time  Assessments & Plan   There are no diagnoses linked to this encounter.    Additional instructions  Subjective:  Patient presents with venous ulcer of the Bilateral lower extremity.    Procedure:  3 layer unna wrap was placed Bilateral lower extremity.  3 layer calamine unna applied with xeroform per pt request   Plan:   Follow up in one week.

## 2024-03-05 ENCOUNTER — Encounter (INDEPENDENT_AMBULATORY_CARE_PROVIDER_SITE_OTHER): Payer: Self-pay | Admitting: Nurse Practitioner

## 2024-03-08 ENCOUNTER — Encounter (INDEPENDENT_AMBULATORY_CARE_PROVIDER_SITE_OTHER): Payer: Self-pay

## 2024-03-08 ENCOUNTER — Ambulatory Visit (INDEPENDENT_AMBULATORY_CARE_PROVIDER_SITE_OTHER): Admitting: Nurse Practitioner

## 2024-03-08 VITALS — BP 132/65 | HR 70 | Resp 18

## 2024-03-08 DIAGNOSIS — L97211 Non-pressure chronic ulcer of right calf limited to breakdown of skin: Secondary | ICD-10-CM | POA: Diagnosis not present

## 2024-03-08 NOTE — Progress Notes (Signed)
 History of Present Illness  There is no documented history at this time  Assessments & Plan   There are no diagnoses linked to this encounter.    Additional instructions  Subjective:  Patient presents with venous ulcer of the Bilateral lower extremity.    Procedure:  3 layer unna wrap was placed Bilateral lower extremity.   Plan:   Follow up in one week.

## 2024-03-11 ENCOUNTER — Encounter (INDEPENDENT_AMBULATORY_CARE_PROVIDER_SITE_OTHER): Payer: Self-pay | Admitting: Nurse Practitioner

## 2024-03-15 ENCOUNTER — Encounter (INDEPENDENT_AMBULATORY_CARE_PROVIDER_SITE_OTHER): Payer: Self-pay

## 2024-03-15 ENCOUNTER — Ambulatory Visit (INDEPENDENT_AMBULATORY_CARE_PROVIDER_SITE_OTHER): Admitting: Nurse Practitioner

## 2024-03-15 VITALS — BP 120/69 | HR 72 | Resp 18

## 2024-03-15 DIAGNOSIS — L97211 Non-pressure chronic ulcer of right calf limited to breakdown of skin: Secondary | ICD-10-CM

## 2024-03-15 NOTE — Progress Notes (Signed)
 History of Present Illness  There is no documented history at this time  Assessments & Plan   There are no diagnoses linked to this encounter.    Additional instructions  Subjective:  Patient presents with venous ulcer of the Bilateral lower extremity.    Procedure:  3 layer unna wrap was placed Bilateral lower extremity.   Plan:   Follow up in one week.

## 2024-03-21 ENCOUNTER — Encounter (INDEPENDENT_AMBULATORY_CARE_PROVIDER_SITE_OTHER): Payer: Self-pay | Admitting: Nurse Practitioner

## 2024-03-21 ENCOUNTER — Ambulatory Visit (INDEPENDENT_AMBULATORY_CARE_PROVIDER_SITE_OTHER): Admitting: Nurse Practitioner

## 2024-03-21 VITALS — BP 98/59 | HR 80 | Resp 18

## 2024-03-21 DIAGNOSIS — I890 Lymphedema, not elsewhere classified: Secondary | ICD-10-CM | POA: Diagnosis not present

## 2024-03-21 DIAGNOSIS — L97211 Non-pressure chronic ulcer of right calf limited to breakdown of skin: Secondary | ICD-10-CM | POA: Diagnosis not present

## 2024-04-11 ENCOUNTER — Ambulatory Visit (INDEPENDENT_AMBULATORY_CARE_PROVIDER_SITE_OTHER): Admitting: Nurse Practitioner
